# Patient Record
Sex: Male | Born: 1935 | Race: White | Hispanic: No | Marital: Married | Smoking: Never smoker
Health system: Southern US, Community
[De-identification: ages and names within clinical notes are randomized; demographics above are authoritative.]

## PROBLEM LIST (undated history)

## (undated) DIAGNOSIS — J309 Allergic rhinitis, unspecified: Secondary | ICD-10-CM

## (undated) DIAGNOSIS — Z9889 Other specified postprocedural states: Secondary | ICD-10-CM

## (undated) DIAGNOSIS — N400 Enlarged prostate without lower urinary tract symptoms: Secondary | ICD-10-CM

## (undated) DIAGNOSIS — R110 Nausea: Secondary | ICD-10-CM

## (undated) DIAGNOSIS — G629 Polyneuropathy, unspecified: Secondary | ICD-10-CM

## (undated) DIAGNOSIS — N200 Calculus of kidney: Secondary | ICD-10-CM

## (undated) DIAGNOSIS — E785 Hyperlipidemia, unspecified: Secondary | ICD-10-CM

## (undated) DIAGNOSIS — M1990 Unspecified osteoarthritis, unspecified site: Secondary | ICD-10-CM

## (undated) DIAGNOSIS — R112 Nausea with vomiting, unspecified: Secondary | ICD-10-CM

## (undated) DIAGNOSIS — K625 Hemorrhage of anus and rectum: Secondary | ICD-10-CM

## (undated) DIAGNOSIS — H9319 Tinnitus, unspecified ear: Secondary | ICD-10-CM

## (undated) DIAGNOSIS — Z87442 Personal history of urinary calculi: Secondary | ICD-10-CM

## (undated) DIAGNOSIS — R519 Headache, unspecified: Secondary | ICD-10-CM

## (undated) DIAGNOSIS — J069 Acute upper respiratory infection, unspecified: Secondary | ICD-10-CM

## (undated) DIAGNOSIS — R002 Palpitations: Secondary | ICD-10-CM

## (undated) DIAGNOSIS — I10 Essential (primary) hypertension: Secondary | ICD-10-CM

## (undated) DIAGNOSIS — G5792 Unspecified mononeuropathy of left lower limb: Secondary | ICD-10-CM

## (undated) DIAGNOSIS — Z96619 Presence of unspecified artificial shoulder joint: Secondary | ICD-10-CM

## (undated) DIAGNOSIS — J189 Pneumonia, unspecified organism: Secondary | ICD-10-CM

## (undated) DIAGNOSIS — R079 Chest pain, unspecified: Secondary | ICD-10-CM

## (undated) DIAGNOSIS — L0390 Cellulitis, unspecified: Secondary | ICD-10-CM

## (undated) DIAGNOSIS — Z96659 Presence of unspecified artificial knee joint: Secondary | ICD-10-CM

## (undated) DIAGNOSIS — R51 Headache: Secondary | ICD-10-CM

## (undated) DIAGNOSIS — Z8669 Personal history of other diseases of the nervous system and sense organs: Secondary | ICD-10-CM

## (undated) HISTORY — DX: Nausea: R11.0

## (undated) HISTORY — DX: Polyneuropathy, unspecified: G62.9

## (undated) HISTORY — DX: Essential (primary) hypertension: I10

## (undated) HISTORY — PX: COLONOSCOPY: SHX174

## (undated) HISTORY — DX: Acute upper respiratory infection, unspecified: J06.9

## (undated) HISTORY — DX: Hemorrhage of anus and rectum: K62.5

## (undated) HISTORY — PX: HERNIA REPAIR: SHX51

## (undated) HISTORY — DX: Hyperlipidemia, unspecified: E78.5

## (undated) HISTORY — DX: Personal history of urinary calculi: Z87.442

## (undated) HISTORY — DX: Tinnitus, unspecified ear: H93.19

## (undated) HISTORY — DX: Benign prostatic hyperplasia without lower urinary tract symptoms: N40.0

## (undated) HISTORY — DX: Chest pain, unspecified: R07.9

## (undated) HISTORY — PX: APPENDECTOMY: SHX54

## (undated) HISTORY — DX: Calculus of kidney: N20.0

## (undated) HISTORY — DX: Allergic rhinitis, unspecified: J30.9

## (undated) HISTORY — DX: Unspecified osteoarthritis, unspecified site: M19.90

## (undated) HISTORY — DX: Palpitations: R00.2

---
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
## 1990-09-16 HISTORY — PX: LITHOTRIPSY: SUR834

## 1998-03-14 ENCOUNTER — Ambulatory Visit (HOSPITAL_COMMUNITY): Admission: EM | Admit: 1998-03-14 | Discharge: 1998-03-14 | Payer: Self-pay

## 2001-03-29 ENCOUNTER — Emergency Department (HOSPITAL_COMMUNITY): Admission: EM | Admit: 2001-03-29 | Discharge: 2001-03-29 | Payer: Self-pay | Admitting: Unknown Physician Specialty

## 2004-07-18 ENCOUNTER — Ambulatory Visit: Payer: Self-pay | Admitting: Internal Medicine

## 2004-09-20 ENCOUNTER — Ambulatory Visit: Payer: Self-pay | Admitting: Internal Medicine

## 2004-09-28 ENCOUNTER — Ambulatory Visit: Payer: Self-pay | Admitting: Internal Medicine

## 2005-03-28 ENCOUNTER — Ambulatory Visit: Payer: Self-pay | Admitting: Internal Medicine

## 2005-04-04 ENCOUNTER — Ambulatory Visit: Payer: Self-pay | Admitting: Internal Medicine

## 2006-03-27 ENCOUNTER — Ambulatory Visit: Payer: Self-pay | Admitting: Internal Medicine

## 2006-04-03 ENCOUNTER — Ambulatory Visit: Payer: Self-pay | Admitting: Internal Medicine

## 2007-04-17 ENCOUNTER — Ambulatory Visit: Payer: Self-pay | Admitting: Internal Medicine

## 2007-04-17 LAB — CONVERTED CEMR LAB
AST: 18 units/L (ref 0–37)
BUN: 28 mg/dL — ABNORMAL HIGH (ref 6–23)
Basophils Relative: 1.3 % — ABNORMAL HIGH (ref 0.0–1.0)
Bilirubin Urine: NEGATIVE
Bilirubin, Direct: 0.2 mg/dL (ref 0.0–0.3)
Chloride: 106 meq/L (ref 96–112)
Cholesterol: 122 mg/dL (ref 0–200)
Creatinine, Ser: 0.8 mg/dL (ref 0.4–1.5)
GFR calc non Af Amer: 102 mL/min
Glucose, Bld: 97 mg/dL (ref 70–99)
Glucose, Urine, Semiquant: NEGATIVE
HDL: 44.4 mg/dL (ref >39.0)
Ketones, urine, test strip: NEGATIVE
LDL Cholesterol: 64 mg/dL (ref 0–99)
Lymphocytes Relative: 24.9 % (ref 12.0–46.0)
Neutro Abs: 3.8 10*3/uL (ref 1.4–7.7)
Neutrophils Relative %: 59.4 % (ref 43.0–77.0)
Nitrite: NEGATIVE
PSA: 2.11 ng/mL (ref 0.10–4.00)
Platelets: 165 10*3/uL (ref 150–400)
Potassium: 4.2 meq/L (ref 3.5–5.1)
Specific Gravity, Urine: 1.025
TSH: 2.7 microintl units/mL (ref 0.35–5.50)
Total Bilirubin: 1.8 mg/dL — ABNORMAL HIGH (ref 0.3–1.2)
Total Protein: 6.9 g/dL (ref 6.0–8.3)
Triglycerides: 69 mg/dL (ref 0–149)
Urobilinogen, UA: 0.2
VLDL: 14 mg/dL (ref 0–40)
WBC Urine, dipstick: NEGATIVE
WBC: 6.4 10*3/uL (ref 4.5–10.5)

## 2007-04-23 ENCOUNTER — Encounter: Payer: Self-pay | Admitting: Internal Medicine

## 2007-04-23 DIAGNOSIS — M1990 Unspecified osteoarthritis, unspecified site: Secondary | ICD-10-CM | POA: Insufficient documentation

## 2007-04-23 DIAGNOSIS — N138 Other obstructive and reflux uropathy: Secondary | ICD-10-CM | POA: Insufficient documentation

## 2007-04-23 DIAGNOSIS — E785 Hyperlipidemia, unspecified: Secondary | ICD-10-CM

## 2007-04-23 DIAGNOSIS — N400 Enlarged prostate without lower urinary tract symptoms: Secondary | ICD-10-CM | POA: Insufficient documentation

## 2007-04-23 DIAGNOSIS — Z87442 Personal history of urinary calculi: Secondary | ICD-10-CM

## 2007-04-23 DIAGNOSIS — J309 Allergic rhinitis, unspecified: Secondary | ICD-10-CM | POA: Insufficient documentation

## 2007-04-23 HISTORY — DX: Unspecified osteoarthritis, unspecified site: M19.90

## 2007-04-23 HISTORY — DX: Hyperlipidemia, unspecified: E78.5

## 2007-04-23 HISTORY — DX: Benign prostatic hyperplasia without lower urinary tract symptoms: N40.0

## 2007-04-23 HISTORY — DX: Allergic rhinitis, unspecified: J30.9

## 2007-04-23 HISTORY — DX: Other obstructive and reflux uropathy: N13.8

## 2007-04-23 HISTORY — DX: Personal history of urinary calculi: Z87.442

## 2007-05-01 ENCOUNTER — Ambulatory Visit: Payer: Self-pay | Admitting: Internal Medicine

## 2007-11-13 ENCOUNTER — Emergency Department (HOSPITAL_COMMUNITY): Admission: EM | Admit: 2007-11-13 | Discharge: 2007-11-13 | Payer: Self-pay | Admitting: Emergency Medicine

## 2008-02-04 ENCOUNTER — Ambulatory Visit: Payer: Self-pay | Admitting: Internal Medicine

## 2008-02-04 DIAGNOSIS — K625 Hemorrhage of anus and rectum: Secondary | ICD-10-CM

## 2008-02-04 DIAGNOSIS — R079 Chest pain, unspecified: Secondary | ICD-10-CM | POA: Insufficient documentation

## 2008-02-04 DIAGNOSIS — R002 Palpitations: Secondary | ICD-10-CM

## 2008-02-04 HISTORY — DX: Hemorrhage of anus and rectum: K62.5

## 2008-02-04 HISTORY — DX: Palpitations: R00.2

## 2008-02-04 HISTORY — DX: Chest pain, unspecified: R07.9

## 2008-02-04 LAB — CONVERTED CEMR LAB
ALT: 20 units/L (ref 0–53)
Alkaline Phosphatase: 69 units/L (ref 39–117)
BUN: 28 mg/dL — ABNORMAL HIGH (ref 6–23)
Basophils Absolute: 0.1 10*3/uL (ref 0.0–0.1)
Calcium: 9.4 mg/dL (ref 8.4–10.5)
Cholesterol: 165 mg/dL (ref 0–200)
Eosinophils Absolute: 0.2 10*3/uL (ref 0.0–0.7)
Eosinophils Relative: 1.4 % (ref 0.0–5.0)
Glucose, Bld: 99 mg/dL (ref 70–99)
HCT: 43.2 % (ref 39.0–52.0)
Hemoglobin: 15.1 g/dL (ref 13.0–17.0)
MCHC: 34.9 g/dL (ref 30.0–36.0)
MCV: 90.2 fL (ref 78.0–100.0)
PSA: 1.55 ng/mL (ref 0.10–4.00)
Platelets: 163 10*3/uL (ref 150–400)
RBC: 4.79 M/uL (ref 4.22–5.81)
RDW: 12.1 % (ref 11.5–14.6)
TSH: 3 microintl units/mL (ref 0.35–5.50)
Total Bilirubin: 1.5 mg/dL — ABNORMAL HIGH (ref 0.3–1.2)
WBC: 12.2 10*3/uL — ABNORMAL HIGH (ref 4.5–10.5)

## 2008-02-18 ENCOUNTER — Encounter: Payer: Self-pay | Admitting: Internal Medicine

## 2008-02-18 ENCOUNTER — Ambulatory Visit: Payer: Self-pay

## 2008-03-04 ENCOUNTER — Ambulatory Visit: Payer: Self-pay | Admitting: Internal Medicine

## 2008-03-07 ENCOUNTER — Telehealth: Payer: Self-pay | Admitting: Internal Medicine

## 2008-03-14 ENCOUNTER — Telehealth: Payer: Self-pay | Admitting: Internal Medicine

## 2008-06-02 ENCOUNTER — Ambulatory Visit: Payer: Self-pay | Admitting: Internal Medicine

## 2008-06-02 DIAGNOSIS — I10 Essential (primary) hypertension: Secondary | ICD-10-CM

## 2008-06-02 HISTORY — DX: Essential (primary) hypertension: I10

## 2008-09-01 ENCOUNTER — Ambulatory Visit: Payer: Self-pay | Admitting: Internal Medicine

## 2008-09-01 DIAGNOSIS — J069 Acute upper respiratory infection, unspecified: Secondary | ICD-10-CM | POA: Insufficient documentation

## 2008-12-29 ENCOUNTER — Ambulatory Visit: Payer: Self-pay | Admitting: Internal Medicine

## 2008-12-29 DIAGNOSIS — R110 Nausea: Secondary | ICD-10-CM | POA: Insufficient documentation

## 2009-03-24 ENCOUNTER — Telehealth: Payer: Self-pay | Admitting: Internal Medicine

## 2009-03-30 ENCOUNTER — Encounter: Payer: Self-pay | Admitting: Internal Medicine

## 2009-05-16 ENCOUNTER — Ambulatory Visit: Payer: Self-pay | Admitting: Internal Medicine

## 2009-05-16 LAB — CONVERTED CEMR LAB
ALT: 26 units/L (ref 0–53)
AST: 27 units/L (ref 0–37)
Albumin: 4.2 g/dL (ref 3.5–5.2)
Basophils Relative: 0 % (ref 0.0–3.0)
CO2: 29 meq/L (ref 19–32)
Calcium: 9.3 mg/dL (ref 8.4–10.5)
Chloride: 109 meq/L (ref 96–112)
Cholesterol: 145 mg/dL (ref 0–200)
Creatinine, Ser: 0.9 mg/dL (ref 0.4–1.5)
Eosinophils Absolute: 0.2 10*3/uL (ref 0.0–0.7)
GFR calc non Af Amer: 88.49 mL/min (ref >60)
Glucose, Bld: 100 mg/dL — ABNORMAL HIGH (ref 70–99)
HCT: 40.1 % (ref 39.0–52.0)
Hemoglobin: 14 g/dL (ref 13.0–17.0)
LDL Cholesterol: 87 mg/dL (ref 0–99)
Lymphs Abs: 1.2 10*3/uL (ref 0.7–4.0)
MCV: 90.7 fL (ref 78.0–100.0)
Platelets: 193 10*3/uL (ref 150.0–400.0)
Sodium: 141 meq/L (ref 135–145)
TSH: 1.38 microintl units/mL (ref 0.35–5.50)
Total Protein: 6.4 g/dL (ref 6.0–8.3)
Triglycerides: 62 mg/dL (ref 0.0–149.0)
VLDL: 12.4 mg/dL (ref 0.0–40.0)

## 2009-05-26 ENCOUNTER — Telehealth: Payer: Self-pay | Admitting: Internal Medicine

## 2009-09-28 ENCOUNTER — Ambulatory Visit (HOSPITAL_COMMUNITY): Admission: RE | Admit: 2009-09-28 | Discharge: 2009-09-29 | Payer: Self-pay | Admitting: Orthopedic Surgery

## 2009-09-28 HISTORY — PX: OTHER SURGICAL HISTORY: SHX169

## 2009-10-16 ENCOUNTER — Encounter: Admission: RE | Admit: 2009-10-16 | Discharge: 2009-12-11 | Payer: Self-pay | Admitting: Orthopedic Surgery

## 2010-01-16 ENCOUNTER — Telehealth: Payer: Self-pay

## 2010-02-28 ENCOUNTER — Ambulatory Visit: Payer: Self-pay | Admitting: Internal Medicine

## 2010-02-28 DIAGNOSIS — H9319 Tinnitus, unspecified ear: Secondary | ICD-10-CM | POA: Insufficient documentation

## 2010-02-28 HISTORY — DX: Tinnitus, unspecified ear: H93.19

## 2010-03-20 ENCOUNTER — Telehealth: Payer: Self-pay | Admitting: Internal Medicine

## 2010-03-26 ENCOUNTER — Encounter: Payer: Self-pay | Admitting: Internal Medicine

## 2010-05-15 ENCOUNTER — Encounter: Admission: RE | Admit: 2010-05-15 | Discharge: 2010-05-15 | Payer: Self-pay | Admitting: Otolaryngology

## 2010-05-31 ENCOUNTER — Ambulatory Visit: Payer: Self-pay | Admitting: Internal Medicine

## 2010-05-31 LAB — CONVERTED CEMR LAB
ALT: 26 units/L (ref 0–53)
Alkaline Phosphatase: 70 units/L (ref 39–117)
Basophils Relative: 0.6 % (ref 0.0–3.0)
Bilirubin, Direct: 0.2 mg/dL (ref 0.0–0.3)
Calcium: 9.2 mg/dL (ref 8.4–10.5)
Chloride: 104 meq/L (ref 96–112)
GFR calc non Af Amer: 104.08 mL/min (ref >60)
LDL Cholesterol: 83 mg/dL (ref 0–99)
MCHC: 34.8 g/dL (ref 30.0–36.0)
MCV: 92.4 fL (ref 78.0–100.0)
Monocytes Absolute: 0.6 10*3/uL (ref 0.1–1.0)
Monocytes Relative: 7.4 % (ref 3.0–12.0)
Neutrophils Relative %: 73.1 % (ref 43.0–77.0)
RDW: 12.9 % (ref 11.5–14.6)
Sodium: 141 meq/L (ref 135–145)
Total CHOL/HDL Ratio: 3
Total Protein: 6.6 g/dL (ref 6.0–8.3)
Triglycerides: 134 mg/dL (ref 0.0–149.0)
VLDL: 26.8 mg/dL (ref 0.0–40.0)
WBC: 8.8 10*3/uL (ref 4.5–10.5)

## 2010-06-08 ENCOUNTER — Telehealth: Payer: Self-pay | Admitting: Internal Medicine

## 2010-10-16 NOTE — Progress Notes (Signed)
Summary: Pt says [facility] sending req for all meds. 90 day x 3.   Phone Note Call from Patient Call back at Home Phone [phone]   Caller: spouse- Paulette Summary of Call: Pts wife called and said that [facility] is sending a fax to request all meds. Need 90 day supply on each x 3.    Pt just wanted to make Dr Amador Cunas aware.  Initial call taken by: Lucy Antigua,  Jan 16, 2010 12:47 PM  Follow-up for Phone Call        noted - will look for faxes Follow-up by: Duard Brady LPN,  Jan 17, 980 12:54 PM

## 2010-10-16 NOTE — Assessment & Plan Note (Signed)
Summary: ringing in lft ear/popping/diff hearing/cjr   Vital Signs:  Patient profile:   75 year old male Weight:      217 pounds Temp:     97.7 degrees F oral BP sitting:   116 / 78  (right arm) Cuff size:   regular  Vitals Entered By: Duard Brady LPN (February 28, 2010 9:14 AM) CC: c/o (L) ear ringing , sm amt sinus stuffiness Is Patient Diabetic? No   CC:  c/o (L) ear ringing  and sm amt sinus stuffiness.  History of Present Illness: 75 year old patient presents with a two week history of some ringing from the left.  He he is has some sinus congestion and stuffiness.  He is seen today also for follow-up of his hypertension.  He does have a history of allergic rhinitis. he states that his hearing has declined somewhat over the years, but no history of acute hearing loss.  Preventive Screening-Counseling & Management  Alcohol-Tobacco     Smoking Status: never  Allergies (verified): No Known Drug Allergies  Past History:  Past Medical History: Reviewed history from 06/02/2008 and no changes required. Hyperlipidemia Nephrolithiasis, hx of Osteoarthritis Benign prostatic hypertrophy Allergic rhinitis systolic hypertension external hemorrhoids Hypertension  Review of Systems  The patient denies anorexia, fever, weight loss, weight gain, vision loss, decreased hearing, hoarseness, chest pain, syncope, dyspnea on exertion, peripheral edema, prolonged cough, headaches, hemoptysis, abdominal pain, melena, hematochezia, severe indigestion/heartburn, hematuria, incontinence, genital sores, muscle weakness, suspicious skin lesions, transient blindness, difficulty walking, depression, unusual weight change, abnormal bleeding, enlarged lymph nodes, angioedema, breast masses, and testicular masses.    Physical Exam  General:  Well-developed,well-nourished,in no acute distress; alert,appropriate and cooperative throughout examination Head:  Normocephalic and atraumatic without  obvious abnormalities. No apparent alopecia or balding. Eyes:  No corneal or conjunctival inflammation noted. EOMI. Perrla. Funduscopic exam benign, without hemorrhages, exudates or papilledema. Vision grossly normal. Ears:  External ear exam shows no significant lesions or deformities.  Otoscopic examination reveals clear canals, tympanic membranes are intact bilaterally without bulging, retraction, inflammation or discharge. Hearing is grossly normal bilaterally. Mouth:  Oral mucosa and oropharynx without lesions or exudates.  Teeth in good repair. Neck:  No deformities, masses, or tenderness noted. Lungs:  Normal respiratory effort, chest expands symmetrically. Lungs are clear to auscultation, no crackles or wheezes. Heart:  Normal rate and regular rhythm. S1 and S2 normal without gallop, murmur, click, rub or other extra sounds.   Impression & Recommendations:  Problem # 1:  TINNITUS (ICD-388.30)  Problem # 2:  HYPERTENSION (ICD-401.9)  His updated medication list for this problem includes:    Benazepril-hydrochlorothiazide 20-12.5 Mg Tabs (Benazepril-hydrochlorothiazide) ..... One  tablet daily  Complete Medication List: 1)  Zocor 40 Mg Tabs (Simvastatin) .Marland Kitchen.. 1 once daily 2)  Proscar 5 Mg Tabs (Finasteride) .Marland Kitchen.. 1 once daily 3)  Flomax 0.4 Mg Cp24 (Tamsulosin hcl) .... 2  once daily 4)  Claritin 10 Mg Tabs (Loratadine) .Marland Kitchen.. 1 once daily 5)  Benazepril-hydrochlorothiazide 20-12.5 Mg Tabs (Benazepril-hydrochlorothiazide) .... One  tablet daily 6)  Adult Aspirin Ec Low Strength 81 Mg Tbec (Aspirin) .Marland Kitchen.. 1 once daily 7)  Urocit-k 10 1080 Mg Cr-tabs (Potassium citrate) .... 2 two times a day 8)  Naproxen Dr 500 Mg Tbec (Naproxen) .... One twice daily 9)  Indomethacin 50 Mg Caps (Indomethacin) .... 2 two times a day 10)  Fluticasone Propionate 50 Mcg/act Susp (Fluticasone propionate) .... Use daily  Patient Instructions: 1)  Please schedule a follow-up  appointment in 6 months. 2)   Limit your Sodium (Salt) to less than 2 grams a day(slightly less than 1/2 a teaspoon) to prevent fluid retention, swelling, or worsening of symptoms. 3)  It is important that you exercise regularly at least 20 minutes 5 times a week. If you develop chest pain, have severe difficulty breathing, or feel very tired , stop exercising immediately and seek medical attention. 4)  You need to lose weight. Consider a lower calorie diet and regular exercise.  Prescriptions: FLUTICASONE PROPIONATE 50 MCG/ACT SUSP (FLUTICASONE PROPIONATE) use daily  #1 x 6   Entered and Authorized by:   [name]  MD   Signed by:   [name]  MD on 02/28/2010   Method used:   Print then Mail to Patient   RxID:   [id number]

## 2010-10-16 NOTE — Consult Note (Signed)
Summary: ENT-Dr. Narda Bonds  ENT-Dr. Narda Bonds   Imported By: Maryln Gottron 04/09/2010 10:48:12  _____________________________________________________________________  External Attachment:    Type:   Image     Comment:   External Document

## 2010-10-16 NOTE — Progress Notes (Signed)
Summary: referral to ENT  Phone Note Call from Patient   Caller: Patient Call For: [name]  MD Summary of Call: Pt is still having ringing in ears, and needs a referral to ENT per Dr. Charm Rings last office visit. [phone] Initial call taken by: Lynann Beaver CMA,  March 20, 2010 9:05 AM  Follow-up for Phone Call        OK to refer- Use whoever Dr Kirtland Bouchard uses. Follow-up by: Evelena Peat MD,  March 20, 2010 9:14 AM

## 2010-10-16 NOTE — Assessment & Plan Note (Signed)
Summary: emp--will fast//ccm   Vital Signs:  Patient profile:   75 year old male Height:      69 inches Weight:      210 pounds BMI:     31.12 Temp:     97.6 degrees F oral BP sitting:   120 / 80  (right arm) Cuff size:   regular  Vitals Entered By: Duard Brady LPN (May 31, 2010 9:25 AM) CC: cpx - doing well Is Patient Diabetic? No  Flu Vaccine Consent Questions     Do you have a history of severe allergic reactions to this vaccine? no    Any prior history of allergic reactions to egg and/or gelatin? no    Do you have a sensitivity to the preservative Thimersol? no    Do you have a past history of Guillan-Barre Syndrome? no    Do you currently have an acute febrile illness? no    Have you ever had a severe reaction to latex? no    Vaccine information given and explained to patient? yes    Are you currently pregnant? no    Lot Number:[id number]   Exp Date:03/16/2011   Site Given  Left Deltoid IM  CC:  cpx - doing well.  History of Present Illness: 75 year old patient who is seen today for a wellness examination.  He is followed by urology for BPH and has a history of nephrolithiasis.  He has dyslipidemia osteoarthritis and hypertension.  He is doing quite well and denies any cardiopulmonary complaints.  Here for Medicare AWV:  1.   Risk factors based on Past M, S, F history:  risk factors include hypertension, dyslipidemia, and a family history of cardiac disease 2.   Physical Activities: remains quite active.  He has 5 forces and rides daily, also walks frequently 3.   Depression/mood: no history of depression or mood disorder 4.   Hearing: mild sensorineural hearing loss, left greater than the right 5.   ADL's: independent in all aspects of daily living 6.   Fall Risk: low 7.   Home Safety: no problems identified 8.   Height, weight, &visual acuity:height and weight normal and unchanged.  No difficulty with visual acuity 9.   Counseling: heart healthy diet  and regular.  Exercise encouraged as well as modest weight loss 10.   Labs ordered based on risk factors: average her profile, including lipid profile will be reviewed 11.           Referral Coordination- follow-up urology 12.           Care Plan- heart healthy diet and weight loss, exercise regimen discussed and encouraged 13.            Cognitive Assessment- alert and oriented normal affect.  No cognitive dysfunction; handles  all executive functions without difficulty   Allergies (verified): No Known Drug Allergies  Past History:  Past Medical History: Reviewed history from 06/02/2008 and no changes required. Hyperlipidemia Nephrolithiasis, hx of Osteoarthritis Benign prostatic hypertrophy Allergic rhinitis systolic hypertension external hemorrhoids Hypertension  Past Surgical History: Reviewed history from 05/16/2009 and no changes required. Appendectomy Inguinal herniorrhaphy Lithotripsy colonoscopy 2004 ETT 1-06 Cardiolye 6-09  Family History: Reviewed history from 05/16/2009 and no changes required. father died at  12, cerebrovascular disease mother died at age 25, MI two sisters two brothers, positive for diabetes; one sister with COPD who is deceased;  one sister died of suicide death  Social History: Reviewed history from 05/01/2007 and no changes required. Married  walks about 2 miles daily  Review of Systems  The patient denies anorexia, fever, weight loss, weight gain, vision loss, decreased hearing, hoarseness, chest pain, syncope, dyspnea on exertion, peripheral edema, prolonged cough, headaches, hemoptysis, abdominal pain, melena, hematochezia, severe indigestion/heartburn, hematuria, incontinence, genital sores, muscle weakness, suspicious skin lesions, transient blindness, difficulty walking, depression, unusual weight change, abnormal bleeding, enlarged lymph nodes, angioedema, breast masses, and testicular masses.    Physical Exam  General:   overweight-appearing.  120/78overweight-appearing.   Head:  Normocephalic and atraumatic without obvious abnormalities. No apparent alopecia or balding. Eyes:  No corneal or conjunctival inflammation noted. EOMI. Perrla. Funduscopic exam benign, without hemorrhages, exudates or papilledema. Vision grossly normal. Ears:  External ear exam shows no significant lesions or deformities.  Otoscopic examination reveals clear canals, tympanic membranes are intact bilaterally without bulging, retraction, inflammation or discharge. Hearing is grossly normal bilaterally. Nose:  External nasal examination shows no deformity or inflammation. Nasal mucosa are pink and moist without lesions or exudates. Mouth:  Oral mucosa and oropharynx without lesions or exudates.  Teeth in good repair. Neck:  No deformities, masses, or tenderness noted. Chest Wall:  No deformities, masses, tenderness or gynecomastia noted. Breasts:  No masses or gynecomastia noted Lungs:  Normal respiratory effort, chest expands symmetrically. Lungs are clear to auscultation, no crackles or wheezes. Heart:  Normal rate and regular rhythm. S1 and S2 normal without gallop, murmur, click, rub or other extra sounds.  grade 2/6 SEM Abdomen:  Bowel sounds positive,abdomen soft and non-tender without masses, organomegaly or hernias noted. Genitalia:  Testes bilaterally descended without nodularity, tenderness or masses. No scrotal masses or lesions. No penis lesions or urethral discharge. Prostate:  per urology Msk:  No deformity or scoliosis noted of thoracic or lumbar spine.   Pulses:  R and L carotid,radial,femoral,dorsalis pedis and posterior tibial pulses are full and equal bilaterally Extremities:  No clubbing, cyanosis, edema, or deformity noted with normal full range of motion of all joints.   Neurologic:  No cranial nerve deficits noted. Station and gait are normal. Plantar reflexes are down-going bilaterally. DTRs are symmetrical  throughout. Sensory, motor and coordinative functions appear intact. Skin:  Intact without suspicious lesions or rashes Cervical Nodes:  No lymphadenopathy noted Axillary Nodes:  No palpable lymphadenopathy Inguinal Nodes:  No significant adenopathy Psych:  Cognition and judgment appear intact. Alert and cooperative with normal attention span and concentration. No apparent delusions, illusions, hallucinations   Impression & Recommendations:  Problem # 1:  Preventive Health Care (ICD-V70.0)  Orders: EKG w/ Interpretation (93000) Medicare -1st Annual Wellness Visit [phone]) Venipuncture (60454) TLB-Lipid Panel (80061-LIPID) TLB-BMP (Basic Metabolic Panel-BMET) (80048-METABOL) TLB-CBC Platelet - w/Differential (85025-CBCD) TLB-TSH (Thyroid Stimulating Hormone) (84443-TSH) TLB-Hepatic/Liver Function Pnl (80076-HEPATIC)  Complete Medication List: 1)  Proscar 5 Mg Tabs (Finasteride) .Marland Kitchen.. 1 once daily 2)  Flomax 0.4 Mg Cp24 (Tamsulosin hcl) .... 2  once daily 3)  Claritin 10 Mg Tabs (Loratadine) .Marland Kitchen.. 1 once daily 4)  Benazepril-hydrochlorothiazide 20-12.5 Mg Tabs (Benazepril-hydrochlorothiazide) .... One  tablet daily 5)  Adult Aspirin Ec Low Strength 81 Mg Tbec (Aspirin) .Marland Kitchen.. 1 once daily 6)  Urocit-k 10 1080 Mg Cr-tabs (Potassium citrate) .... 2 two times a day 7)  Fluticasone Propionate 50 Mcg/act Susp (Fluticasone propionate) .... Use daily 8)  Celebrex 200 Mg Caps (Celecoxib) .... Qd 9)  Multivitamins Tabs (Multiple vitamin) .... Qd 10)  Coenzyme Q10 10 Mg Caps (Coenzyme q10) .... Qd 11)  Glucosamine 500 Mg Caps (Glucosamine sulfate) .... Qd 12)  Chondroitin Sulfate 150 Mg Caps (Chondroitin sulfate a) .... Qd 13)  Msm 1000 Mg Caps (Methylsulfonylmethane) .... Qd 14)  Krill Oil 1000 Mg Caps (Krill oil) .... 2 qd 15)  Vitamin B Complex Caps (B complex vitamins) .... Qd 16)  Simvastatin 40 Mg Tabs (Simvastatin) .... One daily  Other Orders: Admin 1st Vaccine (16109) Flu Vaccine  81yrs + (60454)  Patient Instructions: 1)  Please schedule a follow-up appointment in 6 months. 2)  Limit your Sodium (Salt) to less than 2 grams a day(slightly less than 1/2 a teaspoon) to prevent fluid retention, swelling, or worsening of symptoms. 3)  It is important that you exercise regularly at least 20 minutes 5 times a week. If you develop chest pain, have severe difficulty breathing, or feel very tired , stop exercising immediately and seek medical attention. 4)  You need to lose weight. Consider a lower calorie diet and regular exercise.  Prescriptions: SIMVASTATIN 40 MG TABS (SIMVASTATIN) one daily  #90 x 6   Entered and Authorized by:   [name]  MD   Signed by:   [name]  MD on 05/31/2010   Method used:   Faxed to ...       [facility] (mail-order)             , [place]         Ph: [phone]       Fax: [phone]   RxID:   [id number] FLUTICASONE PROPIONATE 50 MCG/ACT SUSP (FLUTICASONE PROPIONATE) use daily  #1 x 6   Entered and Authorized by:   [name]  MD   Signed by:   [name]  MD on 05/31/2010   Method used:   Faxed to ...       [facility] (mail-order)             , [place]         Ph: [phone]       Fax: [phone]   RxID:   [id number] UROCIT-K 10 1080 MG CR-TABS (POTASSIUM CITRATE) 2 two times a day  #360 x 6   Entered and Authorized by:   [name]  MD   Signed by:   [name]  MD on 05/31/2010   Method used:   Faxed to ...       [facility] (mail-order)             , [place]         Ph: [phone]       Fax: [phone]   RxID:   [id number] BENAZEPRIL-HYDROCHLOROTHIAZIDE 20-12.5 MG TABS (BENAZEPRIL-HYDROCHLOROTHIAZIDE) one  tablet daily  #90 x 6   Entered and Authorized by:   [name]  MD   Signed by:   [name]  MD on 05/31/2010   Method used:   Faxed to ...       [facility] (mail-order)             , [place]         Ph: [phone]       Fax: [phone]   RxID:    [id number] FLOMAX 0.4 MG  CP24 (TAMSULOSIN HCL) 2  once daily  #180 x 6   Entered and Authorized by:   [name]  MD   Signed by:   [name]  MD on 05/31/2010   Method used:   Faxed to ...       [facility] (  mail-order)             , [place]         Ph: [phone]       Fax: [phone]   RxID:   [id number] PROSCAR 5 MG  TABS (FINASTERIDE) 1 once daily  #90 Tablet x 6   Entered and Authorized by:   [name]  MD   Signed by:   [name]  MD on 05/31/2010   Method used:   Faxed to ...       [facility] (mail-order)             , [place]         Ph: [phone]       Fax: [phone]   RxID:   [id number]

## 2010-10-16 NOTE — Progress Notes (Signed)
Summary: lab results.  Phone Note Call from Patient Call back at Work Phone [phone]   Caller: Patient Call For: [name]  MD Summary of Call: Please call pt regarding labs.  He would like a copy of these. Initial call taken by: Lynann Beaver CMA,  June 08, 2010 1:48 PM    please mail copy per Dr Kirtland Bouchard done [facility] CMA  June 08, 2010 3:13 PM

## 2010-12-02 LAB — CBC
HCT: 44.8 % (ref 39.0–52.0)
Hemoglobin: 15.6 g/dL (ref 13.0–17.0)
WBC: 10.1 10*3/uL (ref 4.0–10.5)

## 2010-12-02 LAB — COMPREHENSIVE METABOLIC PANEL
ALT: 25 U/L (ref 0–53)
AST: 19 U/L (ref 0–37)
Albumin: 4.6 g/dL (ref 3.5–5.2)
BUN: 28 mg/dL — ABNORMAL HIGH (ref 6–23)
Calcium: 9.7 mg/dL (ref 8.4–10.5)
Chloride: 98 mEq/L (ref 96–112)
Creatinine, Ser: 0.84 mg/dL (ref 0.4–1.5)
GFR calc non Af Amer: >60 mL/min (ref >60)
Glucose, Bld: 88 mg/dL (ref 70–99)
Sodium: 133 mEq/L — ABNORMAL LOW (ref 135–145)
Total Bilirubin: 1 mg/dL (ref 0.3–1.2)

## 2010-12-02 LAB — PROTIME-INR
INR: 0.98 (ref 0.00–1.49)
Prothrombin Time: 12.9 seconds (ref 11.6–15.2)

## 2010-12-02 LAB — URINALYSIS, ROUTINE W REFLEX MICROSCOPIC
Bilirubin Urine: NEGATIVE
Nitrite: NEGATIVE

## 2010-12-20 ENCOUNTER — Other Ambulatory Visit: Payer: Self-pay | Admitting: Internal Medicine

## 2011-02-01 NOTE — Assessment & Plan Note (Signed)
[facility] OFFICE NOTE   NAME:[name], [name] [name]                    MRN:          [id number]  DATE:04/03/2006                            DOB:          [birth year]   Patient is a 75 year old gentleman seen today for a wellness exam.   MEDICAL PROBLEMS:  1.  Hypercholesterolemia.  2.  Renal stone disease.  3.  BPH.   He is followed closely by Dr. Vernie Ammons.  He is doing well today without  concerns or complaints.  Additionally, he has a history of  DJD and seasonal  allergic rhinitis.  He has required lithotripsy in the past.  His medical  regimen reviewed.   REVIEW OF SYSTEMS:  Unremarkable.  Did have colonoscopy in 2004.   FAMILY HISTORY:  Unchanged.  Positive for diabetes and coronary artery  disease.   PHYSICAL EXAMINATION:  GENERAL APPEARANCE:  A healthy-appearing fit male who  appeared younger than his stated age.  VITAL SIGNS:  Blood pressure was well controlled.  HEENT:  Fundi, ears, nose and throat clear.  NECK:  No bruits.  CHEST:  Clear.  CARDIOVASCULAR:  Normal heart sounds, no murmurs.  ABDOMEN:  Benign.  No organomegaly.  GU:  External genitalia normal.  RECTAL:  Exam deferred.  EXTREMITIES:  Intact peripheral pulses, posterior tibial pulses were faint.  NEUROLOGIC:  Negative.   IMPRESSION:  1.  Hypercholesterolemia.  2.  Degenerative joint disease.  3.  Seasonal allergic rhinitis.  4.  Renal stone disease.   DISPOSITION:  Medical regimen unchanged.  Prescriptions dispensed.  Will  reassess in one year.                                  [name], MD   PFK/[facility]  DD:  04/03/2006  DT:  04/03/2006  Job #:  [id number]

## 2011-06-05 ENCOUNTER — Other Ambulatory Visit (INDEPENDENT_AMBULATORY_CARE_PROVIDER_SITE_OTHER): Payer: Medicare Other

## 2011-06-05 DIAGNOSIS — Z0000 Encounter for general adult medical examination without abnormal findings: Secondary | ICD-10-CM

## 2011-06-05 DIAGNOSIS — E785 Hyperlipidemia, unspecified: Secondary | ICD-10-CM

## 2011-06-05 DIAGNOSIS — I10 Essential (primary) hypertension: Secondary | ICD-10-CM

## 2011-06-05 DIAGNOSIS — Z125 Encounter for screening for malignant neoplasm of prostate: Secondary | ICD-10-CM

## 2011-06-05 LAB — POCT URINALYSIS DIPSTICK
Bilirubin, UA: NEGATIVE
Ketones, UA: NEGATIVE
Leukocytes, UA: NEGATIVE
Nitrite, UA: NEGATIVE
Protein, UA: NEGATIVE
Spec Grav, UA: 1.01
Urobilinogen, UA: 0.2
pH, UA: 5.5

## 2011-06-05 LAB — CBC WITH DIFFERENTIAL/PLATELET
Basophils Relative: 0.6 % (ref 0.0–3.0)
Eosinophils Relative: 4.7 % (ref 0.0–5.0)
HCT: 44.9 % (ref 39.0–52.0)
Hemoglobin: 15.2 g/dL (ref 13.0–17.0)
Lymphocytes Relative: 19.2 % (ref 12.0–46.0)
Lymphs Abs: 1.5 10*3/uL (ref 0.7–4.0)
MCV: 92.6 fl (ref 78.0–100.0)
Monocytes Relative: 8.4 % (ref 3.0–12.0)
Neutro Abs: 5.4 10*3/uL (ref 1.4–7.7)
Neutrophils Relative %: 67.1 % (ref 43.0–77.0)

## 2011-06-05 LAB — BASIC METABOLIC PANEL
Calcium: 9.4 mg/dL (ref 8.4–10.5)
Chloride: 101 mEq/L (ref 96–112)
Creatinine, Ser: 0.9 mg/dL (ref 0.4–1.5)
Glucose, Bld: 94 mg/dL (ref 70–99)
Sodium: 139 mEq/L (ref 135–145)

## 2011-06-05 LAB — HEPATIC FUNCTION PANEL
ALT: 28 U/L (ref 0–53)
Alkaline Phosphatase: 85 U/L (ref 39–117)

## 2011-06-07 LAB — POCT CARDIAC MARKERS
CKMB, poc: <1 — ABNORMAL LOW
Troponin i, poc: <0.05

## 2011-06-07 LAB — CBC
MCV: 89.8
Platelets: 197
RBC: 4.55
WBC: 10.5

## 2011-06-07 LAB — I-STAT 8, (EC8 V) (CONVERTED LAB)
Acid-base deficit: 1
Bicarbonate: 24.3 — ABNORMAL HIGH
Chloride: 107
HCT: 43
Hemoglobin: 14.6
Potassium: 4.5
Sodium: 138
TCO2: 25
pCO2, Ven: 39.9 — ABNORMAL LOW
pH, Ven: 7.393 — ABNORMAL HIGH

## 2011-06-07 LAB — DIFFERENTIAL
Lymphocytes Relative: 13
Lymphs Abs: 1.3
Neutrophils Relative %: 79 — ABNORMAL HIGH

## 2011-06-11 ENCOUNTER — Encounter: Payer: Self-pay | Admitting: Internal Medicine

## 2011-06-11 ENCOUNTER — Other Ambulatory Visit: Payer: Self-pay | Admitting: Internal Medicine

## 2011-06-12 ENCOUNTER — Ambulatory Visit (INDEPENDENT_AMBULATORY_CARE_PROVIDER_SITE_OTHER): Payer: Medicare Other | Admitting: Internal Medicine

## 2011-06-12 ENCOUNTER — Encounter: Payer: Self-pay | Admitting: Internal Medicine

## 2011-06-12 VITALS — BP 120/80 | HR 68 | Temp 98.0°F | Resp 16 | Ht 70.0 in | Wt 220.0 lb

## 2011-06-12 DIAGNOSIS — I10 Essential (primary) hypertension: Secondary | ICD-10-CM

## 2011-06-12 DIAGNOSIS — Z0000 Encounter for general adult medical examination without abnormal findings: Secondary | ICD-10-CM

## 2011-06-12 DIAGNOSIS — E785 Hyperlipidemia, unspecified: Secondary | ICD-10-CM

## 2011-06-12 DIAGNOSIS — N400 Enlarged prostate without lower urinary tract symptoms: Secondary | ICD-10-CM

## 2011-06-12 DIAGNOSIS — Z23 Encounter for immunization: Secondary | ICD-10-CM

## 2011-06-12 MED ORDER — BENAZEPRIL-HYDROCHLOROTHIAZIDE 20-12.5 MG PO TABS: 1.0000 | ORAL_TABLET | Freq: Every day | ORAL | Status: DC

## 2011-06-12 MED ORDER — FLUTICASONE PROPIONATE 50 MCG/ACT NA SUSP: 1.0000 | Freq: Every day | NASAL | Status: DC

## 2011-06-12 MED ORDER — SIMVASTATIN 40 MG PO TABS: 40.0000 mg | ORAL_TABLET | Freq: Every day | ORAL | Status: DC

## 2011-06-12 MED ORDER — MELOXICAM 7.5 MG PO TABS: 7.5000 mg | ORAL_TABLET | Freq: Every day | ORAL | Status: DC

## 2011-06-12 NOTE — Patient Instructions (Signed)
Limit your sodium (Salt) intake    It is important that you exercise regularly, at least 20 minutes 3 to 4 times per week.  If you develop chest pain or shortness of breath seek  medical attention.  Return in 6 months for follow-up  Please check your blood pressure on a regular basis.  If it is consistently greater than 150/90, please make an office appointment.   

## 2011-06-12 NOTE — Progress Notes (Signed)
Subjective:    Patient ID: [name] [name], male    DOB: 10-21-1935, 75 y.o.   MRN: [id number]  HPI History of Present Illness:   75 year old patient who is seen today for a wellness examination. He is followed by urology for BPH and has a history of nephrolithiasis. He has dyslipidemia osteoarthritis and hypertension. He is doing quite well and denies any cardiopulmonary complaints.   Here for Medicare AWV:   1. Risk factors based on Past M, S, F history: risk factors include hypertension, dyslipidemia, and a family history of cardiac disease  2. Physical Activities: remains quite active. He has 5 horses  and rides daily, also walks frequently  3. Depression/mood: no history of depression or mood disorder  4. Hearing: mild sensorineural hearing loss, left greater than the right  5. ADL's: independent in all aspects of daily living  6. Fall Risk: low  7. Home Safety: no problems identified  8. Height, weight, &visual acuity:height and weight normal and unchanged. No difficulty with visual acuity  9. Counseling: heart healthy diet and regular. Exercise encouraged as well as modest weight loss  10. Labs ordered based on risk factors: average her profile, including lipid profile will be reviewed  11. Referral Coordination- follow-up urology  12. Care Plan- heart healthy diet and weight loss, exercise regimen discussed and encouraged  13. Cognitive Assessment- alert and oriented normal affect. No cognitive dysfunction; handles all executive functions without difficulty   Allergies (verified) :  No Known Drug Allergies   Past History:  Past Medical History:  Reviewed history from 06/02/2008 and no changes required.  Hyperlipidemia  Nephrolithiasis, hx of  Osteoarthritis  Benign prostatic hypertrophy  Allergic rhinitis  systolic hypertension  external hemorrhoids  Hypertension   Past Surgical History:  Reviewed history from 05/16/2009 and no changes required.  Appendectomy    Inguinal herniorrhaphy  Lithotripsy  colonoscopy 2004  ETT 1-06  Cardiolye 6-09   Family History:  Reviewed history from 05/16/2009 and no changes required.  father died at 64, cerebrovascular disease  mother died at age 87, MI  two sisters two brothers, positive for diabetes; one sister with COPD who is deceased; one brother  died of suicide death  Social History:  Reviewed history from 05/01/2007 and no changes required.  Married  walks about 2 miles daily     Review of Systems  Constitutional: Negative for fever, chills, activity change, appetite change and fatigue.  HENT: Negative for hearing loss, ear pain, congestion, rhinorrhea, sneezing, mouth sores, trouble swallowing, neck pain, neck stiffness, dental problem, voice change, sinus pressure and tinnitus.   Eyes: Negative for photophobia, pain, redness and visual disturbance.  Respiratory: Negative for apnea, cough, choking, chest tightness, shortness of breath and wheezing.   Cardiovascular: Negative for chest pain, palpitations and leg swelling.  Gastrointestinal: Negative for nausea, vomiting, abdominal pain, diarrhea, constipation, blood in stool, abdominal distention, anal bleeding and rectal pain.  Genitourinary: Negative for dysuria, urgency, frequency, hematuria, flank pain, decreased urine volume, discharge, penile swelling, scrotal swelling, difficulty urinating, genital sores and testicular pain.  Musculoskeletal: Negative for myalgias, back pain, joint swelling, arthralgias and gait problem.  Skin: Negative for color change, rash and wound.  Neurological: Negative for dizziness, tremors, seizures, syncope, facial asymmetry, speech difficulty, weakness, light-headedness, numbness and headaches.  Hematological: Negative for adenopathy. Does not bruise/bleed easily.  Psychiatric/Behavioral: Negative for suicidal ideas, hallucinations, behavioral problems, confusion, sleep disturbance, self-injury, dysphoric mood,  decreased concentration and agitation. The patient is not nervous/anxious.  Objective:   Physical Exam  Constitutional: He appears well-developed and well-nourished.  HENT:  Head: Normocephalic and atraumatic.  Right Ear: External ear normal.  Left Ear: External ear normal.  Nose: Nose normal.  Mouth/Throat: Oropharynx is clear and moist.  Eyes: Conjunctivae and EOM are normal. Pupils are equal, round, and reactive to light. No scleral icterus.  Neck: Normal range of motion. Neck supple. No JVD present. No thyromegaly present.  Cardiovascular: Regular rhythm, normal heart sounds and intact distal pulses.  Exam reveals no gallop and no friction rub.   No murmur heard. Pulmonary/Chest: Effort normal and breath sounds normal. He exhibits no tenderness.  Abdominal: Soft. Bowel sounds are normal. He exhibits no distension and no mass. There is no tenderness.  Genitourinary: Penis normal.  Musculoskeletal: Normal range of motion. He exhibits no edema and no tenderness.  Lymphadenopathy:    He has no cervical adenopathy.  Neurological: He is alert. He has normal reflexes. No cranial nerve deficit. Coordination normal.  Skin: Skin is warm and dry. No rash noted.  Psychiatric: He has a normal mood and affect. His behavior is normal.          Assessment & Plan:    Preventive health examination Hypertension well controlled Dyslipidemia well controlled. We'll continue simvastatin therapy  Recheck 6 months Urology followup as scheduled

## 2011-07-30 HISTORY — PX: OTHER SURGICAL HISTORY: SHX169

## 2011-08-18 ENCOUNTER — Other Ambulatory Visit: Payer: Self-pay | Admitting: Internal Medicine

## 2011-08-21 ENCOUNTER — Other Ambulatory Visit: Payer: Self-pay | Admitting: Internal Medicine

## 2011-08-21 NOTE — Telephone Encounter (Signed)
Pt need generic flomax #14 call into [facility] [phone] . Pt is waiting on mailorder to arrive

## 2011-08-22 MED ORDER — TAMSULOSIN HCL 0.4 MG PO CAPS: 0.4000 mg | ORAL_CAPSULE | Freq: Every day | ORAL | Status: DC

## 2011-08-22 NOTE — Telephone Encounter (Signed)
done

## 2011-08-23 ENCOUNTER — Other Ambulatory Visit: Payer: Self-pay | Admitting: Internal Medicine

## 2011-10-03 ENCOUNTER — Other Ambulatory Visit: Payer: Self-pay

## 2011-10-03 MED ORDER — CELECOXIB 200 MG PO CAPS: 200.0000 mg | ORAL_CAPSULE | Freq: Every day | ORAL | Status: DC

## 2011-10-03 NOTE — Telephone Encounter (Signed)
celebrex sent to [facility]

## 2011-10-07 ENCOUNTER — Telehealth: Payer: Self-pay | Admitting: *Deleted

## 2011-10-07 MED ORDER — FINASTERIDE 5 MG PO TABS: 5.0000 mg | ORAL_TABLET | Freq: Every day | ORAL | Status: DC

## 2011-10-07 MED ORDER — TAMSULOSIN HCL 0.4 MG PO CAPS: 0.4000 mg | ORAL_CAPSULE | Freq: Every day | ORAL | Status: DC

## 2011-10-07 NOTE — Telephone Encounter (Signed)
Spoke with wife - informed that he should take only 1 flomax instead of 2 - rx done

## 2011-10-07 NOTE — Telephone Encounter (Signed)
#  90 each  One daily  RF 4

## 2011-10-07 NOTE — Telephone Encounter (Signed)
Please call pt's wife ASAP re: his change of meds to [facility], and is out of meds.

## 2011-10-07 NOTE — Telephone Encounter (Signed)
soke with wife - on proscar and flomax - mailorder wont fill without our auth - please advise then I will call in short term on flomax - he will be out in 2 days . He has been on both meds for quite some time.

## 2012-02-07 ENCOUNTER — Other Ambulatory Visit: Payer: Self-pay

## 2012-02-07 MED ORDER — FLUTICASONE PROPIONATE 50 MCG/ACT NA SUSP: 1.0000 | Freq: Every day | NASAL | Status: DC

## 2012-02-15 DIAGNOSIS — L0390 Cellulitis, unspecified: Secondary | ICD-10-CM | POA: Insufficient documentation

## 2012-02-15 HISTORY — DX: Cellulitis, unspecified: L03.90

## 2012-02-19 ENCOUNTER — Ambulatory Visit (INDEPENDENT_AMBULATORY_CARE_PROVIDER_SITE_OTHER): Payer: Medicare Other | Admitting: Internal Medicine

## 2012-02-19 ENCOUNTER — Encounter: Payer: Self-pay | Admitting: Internal Medicine

## 2012-02-19 VITALS — BP 92/58 | Temp 98.5°F | Wt 208.0 lb

## 2012-02-19 DIAGNOSIS — M1990 Unspecified osteoarthritis, unspecified site: Secondary | ICD-10-CM

## 2012-02-19 DIAGNOSIS — I10 Essential (primary) hypertension: Secondary | ICD-10-CM

## 2012-02-19 DIAGNOSIS — L259 Unspecified contact dermatitis, unspecified cause: Secondary | ICD-10-CM

## 2012-02-19 DIAGNOSIS — L309 Dermatitis, unspecified: Secondary | ICD-10-CM

## 2012-02-19 MED ORDER — GABAPENTIN 600 MG PO TABS: 600.0000 mg | ORAL_TABLET | Freq: Three times a day (TID) | ORAL | Status: DC

## 2012-02-19 NOTE — Patient Instructions (Signed)
Call or return to clinic prn if these symptoms worsen or fail to improve as anticipated.

## 2012-02-19 NOTE — Progress Notes (Signed)
  Subjective:    Patient ID: [name] [name], male    DOB: 12-28-1935, 76 y.o.   MRN: [id number]  HPI  76 year old patient who has a history of treated hypertension as well as osteoarthritis. He presents with a two-day history of a rash involving the left flank area. He has been seen by urology recently and is on day 7 of a prescription for Bactrim DS for a UTI. Rash as described slightly tender and pruritic.    Review of Systems  Constitutional: Negative for fever, chills, appetite change and fatigue.  HENT: Negative for hearing loss, ear pain, congestion, sore throat, trouble swallowing, neck stiffness, dental problem, voice change and tinnitus.   Eyes: Negative for pain, discharge and visual disturbance.  Respiratory: Negative for cough, chest tightness, wheezing and stridor.   Cardiovascular: Negative for chest pain, palpitations and leg swelling.  Gastrointestinal: Negative for nausea, vomiting, abdominal pain, diarrhea, constipation, blood in stool and abdominal distention.  Genitourinary: Negative for urgency, hematuria, flank pain, discharge, difficulty urinating and genital sores.  Musculoskeletal: Negative for myalgias, back pain, joint swelling, arthralgias and gait problem.  Skin: Positive for rash.  Neurological: Negative for dizziness, syncope, speech difficulty, weakness, numbness and headaches.  Hematological: Negative for adenopathy. Does not bruise/bleed easily.  Psychiatric/Behavioral: Negative for behavioral problems and dysphoric mood. The patient is not nervous/anxious.        Objective:   Physical Exam  Constitutional: He appears well-developed and well-nourished. No distress.       Blood pressure 120/64  Skin: Rash noted.       An oval area of erythema associated with some mild tenderness and induration involving his left flank area widest diameter was approximately 10 cm          Assessment & Plan:    Nonspecific dermatitis left flank. This does not  have the appearance of shingles. Wonder if this is not an allergic reaction to the sulfa. We'll continue Claritin and discontinue Bactrim at this time and clinically observe. We'll apply warm compresses.

## 2012-02-20 ENCOUNTER — Telehealth: Payer: Self-pay | Admitting: Family Medicine

## 2012-02-20 NOTE — Telephone Encounter (Signed)
Pt was in to see Dr. Kirtland Bouchard yesterday for rash on torso. Was thought to be reaction to abx. Stopped taking the abx. Rash is larger, more painful. Now is aching all over, and having pain in his back. Wife wondering if this could be something other than allergic reaction. Please call ASAP. Thank you.

## 2012-02-20 NOTE — Telephone Encounter (Signed)
Spoke with wife - rash worse?, chills, body aches - dr. Amador Cunas out of office until Monday - appt made for tomorrow with dr. Caryl Never - if feels better and has ok night - will call and cancel appt in AM .

## 2012-02-21 ENCOUNTER — Encounter: Payer: Self-pay | Admitting: Family Medicine

## 2012-02-21 ENCOUNTER — Telehealth: Payer: Self-pay | Admitting: *Deleted

## 2012-02-21 ENCOUNTER — Ambulatory Visit (INDEPENDENT_AMBULATORY_CARE_PROVIDER_SITE_OTHER): Payer: Medicare Other | Admitting: Family Medicine

## 2012-02-21 VITALS — BP 130/70 | Temp 99.8°F | Wt 208.0 lb

## 2012-02-21 DIAGNOSIS — M549 Dorsalgia, unspecified: Secondary | ICD-10-CM

## 2012-02-21 DIAGNOSIS — L0291 Cutaneous abscess, unspecified: Secondary | ICD-10-CM

## 2012-02-21 DIAGNOSIS — L0390 Cellulitis, unspecified: Secondary | ICD-10-CM

## 2012-02-21 LAB — POCT URINALYSIS DIPSTICK
Ketones, UA: NEGATIVE
Protein, UA: NEGATIVE
Spec Grav, UA: 1.015

## 2012-02-21 MED ORDER — CEFTRIAXONE SODIUM 1 G IJ SOLR: 1.0000 g | Freq: Once | INTRAMUSCULAR | Status: DC

## 2012-02-21 MED ORDER — CEPHALEXIN 500 MG PO CAPS: 500.0000 mg | ORAL_CAPSULE | Freq: Three times a day (TID) | ORAL | Status: AC

## 2012-02-21 MED ORDER — CEFTRIAXONE SODIUM 1 G IJ SOLR
1.0000 g | Freq: Once | INTRAMUSCULAR | Status: DC
  Administered 2012-02-21: 1 g via INTRAMUSCULAR

## 2012-02-21 NOTE — Patient Instructions (Signed)
Followup promptly for any nausea and vomiting or worsening rash over the weekend

## 2012-02-21 NOTE — Telephone Encounter (Signed)
Spoke with wife. Patient had shaking chill this afternoon. No vomiting or nausea. No confusion. No lethargy. No recurrent chills since then. Temperature has come down with Tylenol. He has kept down antibiotic. They know to followup immediately to emergency department if he develops any symptoms as above

## 2012-02-21 NOTE — Progress Notes (Signed)
  Subjective:    Patient ID: [name] [name], male    DOB: [birth year], 76 y.o.   MRN: [id number]  HPI  Patient has a low-grade fever and chills which started last night. Has some diffuse body aches. Had recent treatment with Septra DS and had nonspecific rash couple days ago felt to represent nonspecific dermatitis and possibly allergy to Septra. He denied any generalized rash. Has not been on any antibiotics last couple days. Denies any pustules. He has area of erythema left iliac crest region which has progressed over the past couple of days. No vesicles. Rash is tender to palpation. No pruritus. He has some nonspecific low back pain. No burning with urination. No nausea or vomiting.  Past Medical History  Diagnosis Date  . HYPERLIPIDEMIA 04/23/2007  . TINNITUS 02/28/2010  . HYPERTENSION 06/02/2008  . URI 09/01/2008  . ALLERGIC RHINITIS 04/23/2007  . RECTAL BLEEDING 02/04/2008  . BENIGN PROSTATIC HYPERTROPHY 04/23/2007  . OSTEOARTHRITIS 04/23/2007  . Palpitations 02/04/2008  . CHEST PAIN 02/04/2008  . NAUSEA 12/29/2008  . NEPHROLITHIASIS, HX OF 04/23/2007   Past Surgical History  Procedure Date  . Appendectomy   . Hernia repair   . Lithotripsy     reports that he has never smoked. He has never used smokeless tobacco. He reports that he does not drink alcohol or use illicit drugs. family history includes COPD in his sister; Diabetes in his brother and sister; Heart disease (age of onset:68) in his mother; and Stroke (age of onset:82) in his father. No Known Allergies    Review of Systems  Constitutional: Positive for fever, chills and fatigue.  Respiratory: Negative for cough and shortness of breath.   Gastrointestinal: Negative for nausea and vomiting.  Genitourinary: Negative for dysuria.  Neurological: Negative for dizziness and syncope.  Hematological: Negative for adenopathy.  Psychiatric/Behavioral: Negative for confusion.       Objective:   Physical Exam  Constitutional: He  appears well-developed and well-nourished.  Cardiovascular: Normal rate and regular rhythm.   Pulmonary/Chest: Effort normal and breath sounds normal. No respiratory distress. He has no wheezes. He has no rales.  Skin:       Patient has a large area of erythema which is warm to palpation and tender - which is approximately 20 x 15 cm left flank. No pustules. No vesicles.          Assessment & Plan:  Cellulitis left flank. Patient is nontoxic. Ceftriaxone 1 g IM. Keflex 500 mg 3 times a day for 10 days. Followup with primary early next week to reassess and sooner as needed. We elected not to cover with Septra or doxycycline since was recently on Septra with progression of rash and this should have had good coverage for MRSA.

## 2012-02-21 NOTE — Telephone Encounter (Signed)
Pt wife calling 4:50 pm to report her husband has been shaking for approx 40 minutes, and his shaking is more severe also.  His temp is 100.2 and he has had Tylenol.  Wife reports he had the shakes prior to OV today, however the episodes never lasted this long.  She tried to take his BP and pulse however he was shaking too hard, did not work.  Questioning if Dr Caryl Never feels they should do anything tonight if this continues.  They are aware of the Sat clinic tomorrow.  Paulette's number is [phone]  15 min later called pt wife back and she was able to report he had improved greatly, not shaking near as hard his BP is 160/66, pulse 80, temp 100.8 and he just took 2 tylenol.

## 2012-02-24 ENCOUNTER — Encounter: Payer: Self-pay | Admitting: Internal Medicine

## 2012-02-24 ENCOUNTER — Ambulatory Visit (INDEPENDENT_AMBULATORY_CARE_PROVIDER_SITE_OTHER): Payer: Medicare Other | Admitting: Internal Medicine

## 2012-02-24 VITALS — BP 110/70 | Temp 97.6°F | Wt 206.0 lb

## 2012-02-24 DIAGNOSIS — L0390 Cellulitis, unspecified: Secondary | ICD-10-CM

## 2012-02-24 DIAGNOSIS — L0291 Cutaneous abscess, unspecified: Secondary | ICD-10-CM

## 2012-02-24 MED ORDER — GABAPENTIN 600 MG PO TABS: 600.0000 mg | ORAL_TABLET | Freq: Three times a day (TID) | ORAL | Status: DC

## 2012-02-24 NOTE — Progress Notes (Signed)
  Subjective:    Patient ID: [name] [name], male    DOB: [birth year], 76 y.o.   MRN: [id number]  HPI  76 year old patient who is seen today for followup of cellulitis involving his left flank area. He was initially seen 5 days ago with an area of patchy erythema involving left flank area he was on sulfa antibiotics at that time and it was felt that this possibly was a sulfa allergy antibiotic was discontinued and he was seen 2 days later with a worsening rash involving the left flank associated with fever. He received a injection of Rocephin and has been on cephalexin 500 mg 3 times daily; the  following day he felt quite poorly with fever of 102 with chills and severe malaise. Yesterday he improved and today he feels quite well. Temperature today 97.6    Review of Systems  Constitutional: Positive for fever, chills and fatigue. Negative for appetite change.  HENT: Negative for hearing loss, ear pain, congestion, sore throat, trouble swallowing, neck stiffness, dental problem, voice change and tinnitus.   Eyes: Negative for pain, discharge and visual disturbance.  Respiratory: Negative for cough, chest tightness, wheezing and stridor.   Cardiovascular: Negative for chest pain, palpitations and leg swelling.  Gastrointestinal: Negative for nausea, vomiting, abdominal pain, diarrhea, constipation, blood in stool and abdominal distention.  Genitourinary: Negative for urgency, hematuria, flank pain, discharge, difficulty urinating and genital sores.  Musculoskeletal: Negative for myalgias, back pain, joint swelling, arthralgias and gait problem.  Skin: Positive for rash.  Neurological: Negative for dizziness, syncope, speech difficulty, weakness, numbness and headaches.  Hematological: Negative for adenopathy. Does not bruise/bleed easily.  Psychiatric/Behavioral: Negative for behavioral problems and dysphoric mood. The patient is not nervous/anxious.        Objective:   Physical Exam    Constitutional: He appears well-developed and well-nourished. No distress.       Afebrile. No distress. Blood pressure 120/72. Pulse rate and normal. Nontoxic  Skin: There is erythema.       Large area of patchy erythema involving the left flank area. No tenderness or fluctuance          Assessment & Plan:   Cellulitis left flank. Improved History of the tick bite.  We'll continue  Cephalexin;  he will report any deterioration. Otherwise return in 3 months for general medical followup

## 2012-02-24 NOTE — Patient Instructions (Signed)
Take your antibiotic as prescribed until ALL of it is gone, but stop if you develop a rash, swelling, or any side effects of the medication.  Contact our office as soon as possible if  there are side effects of the medication.  Call or return to clinic prn if these symptoms worsen or fail to improve as anticipated.  

## 2012-03-06 ENCOUNTER — Other Ambulatory Visit: Payer: Self-pay | Admitting: Urology

## 2012-03-17 ENCOUNTER — Encounter (HOSPITAL_COMMUNITY): Payer: Self-pay | Admitting: Pharmacy Technician

## 2012-03-23 ENCOUNTER — Encounter (HOSPITAL_COMMUNITY)
Admission: RE | Admit: 2012-03-23 | Discharge: 2012-03-23 | Disposition: A | Discharge status: Home / Self Care | Payer: Medicare Other | Source: Ambulatory Visit | Attending: Urology | Admitting: Urology

## 2012-03-23 ENCOUNTER — Encounter (HOSPITAL_COMMUNITY): Payer: Self-pay

## 2012-03-23 ENCOUNTER — Ambulatory Visit (HOSPITAL_COMMUNITY)
Admission: RE | Admit: 2012-03-23 | Discharge: 2012-03-23 | Disposition: A | Discharge status: Home / Self Care | Payer: Medicare Other | Source: Ambulatory Visit | Attending: Urology | Admitting: Urology

## 2012-03-23 DIAGNOSIS — Z01818 Encounter for other preprocedural examination: Secondary | ICD-10-CM | POA: Insufficient documentation

## 2012-03-23 DIAGNOSIS — Z01812 Encounter for preprocedural laboratory examination: Secondary | ICD-10-CM | POA: Insufficient documentation

## 2012-03-23 DIAGNOSIS — E119 Type 2 diabetes mellitus without complications: Secondary | ICD-10-CM | POA: Insufficient documentation

## 2012-03-23 HISTORY — DX: Cellulitis, unspecified: L03.90

## 2012-03-23 HISTORY — DX: Calculus of kidney: N20.0

## 2012-03-23 HISTORY — DX: Other specified postprocedural states: R11.2

## 2012-03-23 HISTORY — DX: Unspecified mononeuropathy of left lower limb: G57.92

## 2012-03-23 HISTORY — DX: Personal history of other diseases of the nervous system and sense organs: Z86.69

## 2012-03-23 HISTORY — DX: Other specified postprocedural states: Z98.890

## 2012-03-23 LAB — CBC
HCT: 41.3 % (ref 39.0–52.0)
Hemoglobin: 14.1 g/dL (ref 13.0–17.0)
MCH: 30.1 pg (ref 26.0–34.0)
MCHC: 34.1 g/dL (ref 30.0–36.0)
MCV: 88.1 fL (ref 78.0–100.0)
Platelets: 194 10*3/uL (ref 150–400)
RBC: 4.69 MIL/uL (ref 4.22–5.81)
RDW: 14.5 % (ref 11.5–15.5)
WBC: 7.6 10*3/uL (ref 4.0–10.5)

## 2012-03-23 LAB — SURGICAL PCR SCREEN
MRSA, PCR: NEGATIVE
Staphylococcus aureus: NEGATIVE

## 2012-03-23 LAB — BASIC METABOLIC PANEL
BUN: 33 mg/dL — ABNORMAL HIGH (ref 6–23)
CO2: 25 mEq/L (ref 19–32)
Calcium: 9.7 mg/dL (ref 8.4–10.5)
Chloride: 103 mEq/L (ref 96–112)
Creatinine, Ser: 0.75 mg/dL (ref 0.50–1.35)
GFR calc Af Amer: >90 mL/min (ref >90)
GFR calc non Af Amer: 89 mL/min — ABNORMAL LOW (ref >90)
Glucose, Bld: 99 mg/dL (ref 70–99)
Potassium: 4.2 mEq/L (ref 3.5–5.1)
Sodium: 137 mEq/L (ref 135–145)

## 2012-03-23 NOTE — Patient Instructions (Addendum)
22 [name] [name]  03/23/2012   Your procedure is scheduled on:  03-27-2012  Report to [facility] at 0700 AM.  Call this number if you have problems the morning of surgery: [phone]   Remember:   Do not eat food or drink liquids:After Midnight.  .  Take these medicines the morning of surgery with A SIP OF WATER: neurontin, flonase nasal spray if needed, eye drop if needed   Do not wear jewelry or make up.  Do not wear lotions, powders, or perfumes.Do not wear deodorant.    Do not bring valuables to the hospital.  Contacts, dentures or bridgework may not be worn into surgery.  Leave suitcase in the car. After surgery it may be brought to your room.  For patients admitted to the hospital, checkout time is 11:00 AM the day of discharge.     Special Instructions: CHG Shower Use Special Wash: 1/2 bottle night before surgery and 1/2 bottle morning of surgery, use regular soap on face and front and back private area.   Please read over the following fact sheets that you were given: MRSA Information  [name] [name] [facility] pre op nurse phone number [phone], call if needed

## 2012-03-23 NOTE — Progress Notes (Signed)
06-12-2011 ekg epic and on chart

## 2012-03-24 NOTE — Pre-Procedure Instructions (Signed)
bmet results faxed to dr Vernie Ammons, fax confirmation received and placed on pt chart

## 2012-03-26 NOTE — H&P (Signed)
istory of Present Illness  BPH with bladder outlet obstruction: This has, in the past, resulted in an elevated PVR. His outlet obstruction is fairly significant and we have talked about possible transurethral resection of his prostate in the past. He wanted to try maximum medical management and observation, since it was not bothering him and he was not having recurrent infections or other difficulty.  He continues to take to tamsulosin at this time and in addition he remains on a 5-alpha reductase inhibitor.          He has a known left lower pole renal calculus. It measured 16 x 9 mm by KUB in 01/08 and he has been maintained on Urocit-K 10 mEq b.i.d.. He denies any flank pain or hematuria.  As far as his voiding goes he remained stable and he does not want to consider any form of surgical intervention at this time.   Interval history: He had a recent Escherichia coli UTI which has not been treated. He is asymptomatic from that.   Past Medical History Problems  1. History of  Arthritis V13.4 2. History of  Hypercholesterolemia 272.0 3. History of  PSA,Elevated 790.93  Surgical History Problems  1. History of  Appendectomy 2. History of  Biopsy Of The Prostate Needle 3. History of  Foot Surgery Left 4. History of  Lithotripsy 5. History of  Shoulder Arthroplasty Total Shoulder Replacement Right  Current Meds 1. Aspirin 81 MG Oral Tablet; Therapy: (Recorded:31Jan2008) to 2. Benazepril-Hydrochlorothiazide 20-12.5 MG Oral Tablet; Therapy: (Recorded:17Jan2011) to 3. CeleBREX CAPS; Therapy: (Recorded:17Jan2011) to 4. Chondroitin Sulfate CAPS; Therapy: (Recorded:31Jan2008) to 5. Claritin 10 MG Oral Tablet; Therapy: (Recorded:31Jan2008) to 6. Co Q10 100 MG TABS; Therapy: (Recorded:31Jan2008) to 7. Fluticasone Propionate 50 MCG/ACT Nasal Suspension; Therapy: 26Sep2012 to 8. Gabapentin 300 MG Oral Capsule; Therapy: 23Apr2013 to 9. Glucosamine 1500 Complex Oral Capsule; Therapy:  (Recorded:31Jan2008) to 10. Krill Oil CAPS; Therapy: (Recorded:17Jan2011) to 11. MSM CAPS; Therapy: (Recorded:31Jan2008) to 12. Multi Vitamin Mens Oral Tablet; Therapy: (Recorded:31Jan2008) to 13. Omega-3 CAPS; Therapy: (Recorded:31Jan2008) to 14. Proscar 5 MG Oral Tablet; Therapy: (Recorded:25Sep2012) to 15. Sulfamethoxazole-TMP DS 800-160 MG Oral Tablet; TAKE 1 TABLET TWICE DAILY; Therapy:   29May2013 to (Evaluate:12Jun2013)  Requested for: 29May2013; Last Rx:29May2013 16. Tamsulosin HCl 0.4 MG Oral Capsule; Therapy: (Recorded:26Jul2011) to 17. Uribel 118 MG Oral Capsule; TAKE 1 CAPSULE Every 6 hours prn bladder pain, frequency,   urgency; Therapy: 29May2013 to (Evaluate:12Jun2013); Last Rx:29May2013 18. Urocit-K 10 10 MEQ (1080 MG) Oral Tablet Extended Release; Therapy: (Recorded:25Sep2012)   to 19. Viagra 100 MG Oral Tablet; Take 1 tablet as needed; Therapy: 01Aug2011 to   (Evaluate:12Oct2011); Last Rx:01Aug2011 20. Vitamin B Complex-C CAPS; Therapy: (Recorded:17Jan2011) to 21. Zocor 40 MG Oral Tablet; Therapy: (Recorded:31Jan2008) to  Allergies Medication  1. No Known Drug Allergies  Social History Problems  1. Marital History - Currently Married 2. Never A Smoker Denied  3. History of  Alcohol Use 4. History of  Tobacco Use  Vitals Vital Signs BMI Calculated: 28.63 BSA Calculated: 2.09 Height: 5 ft 10 in Weight: 200 lb  Blood Pressure: 111 / 55 Heart Rate: 61  Review of Systems Genitourinary, constitutional, skin, eye, otolaryngeal, hematologic/lymphatic, cardiovascular, pulmonary, endocrine, musculoskeletal, gastrointestinal, neurological and psychiatric system(s) were reviewed and pertinent findings if present are noted.  Genitourinary: urinary frequency, feelings of urinary urgency, dysuria, nocturia, difficulty starting the urinary stream, weak urinary stream, urinary stream starts and stops, incomplete emptying of bladder and initiating urination requires  straining.   Physical  Exam Constitutional: Well nourished and well developed . No acute distress.  ENT:. The ears and nose are normal in appearance.  Neck: The appearance of the neck is normal and no neck mass is present.  Pulmonary: No respiratory distress and normal respiratory rhythm and effort.  Cardiovascular: Heart rate and rhythm are normal . No peripheral edema.  Abdomen: The abdomen is mildly obese. The abdomen is soft and nontender. No masses are palpated. No CVA tenderness. No hernias are palpable. No hepatosplenomegaly noted.  Genitourinary: Examination of the penis demonstrates no discharge, no masses, no lesions and a normal meatus. The penis is circumcised. The scrotum is without lesions. The right vas deferens is is palpably normal. The left vas deferens is palpably normal. The right epididymis is palpably normal and non-tender. The left epididymis is palpably normal and non-tender. The right testis is non-tender and without masses. The left testis is non-tender and without masses.  Lymphatics: The femoral and inguinal nodes are not enlarged or tender.  Skin: Normal skin turgor, no visible rash and no visible skin lesions.  Neuro/Psych:. Mood and affect are appropriate.   Assessment Assessed  1. Benign Prostatic Hypertrophy With Urinary Obstruction 600.01 2. Incomplete Emptying Of Bladder 788.21   We had a long discussion today about the fact that he is carrying a slight residual. He has a very large prostate that has increased in size over time and he is now beginning to have more than just his symptoms but actually having infections. I told him that I reviewed his ultrasound PVR is over the years and do note he has had fairly significant increase in size of his prostate gland. Because of the large size of his prostate and the fact that he's been having the difficulties that he has we discussed surgical options. He would not be a candidate for the minimally invasive procedures  such as transurethral microwave. We discussed an open prostatectomy versus TURP. I went over the pluses and minuses of each. I told him that I would be willing to try and resect his prostate transurethrally with the understanding that it may require a second procedure in order to get him completely opened up due to his large prostate size. I went over the procedure with him in detail and also gave him written information about the procedure. I have discussed all the complications and the risks associated with this procedure. I have answered all of his questions and he has elected to proceed.   Plan    He will be scheduled for a transurethral resection of his prostate.

## 2012-03-27 ENCOUNTER — Ambulatory Visit (HOSPITAL_COMMUNITY)
Admission: RE | Admit: 2012-03-27 | Discharge: 2012-03-28 | Disposition: A | Discharge status: Home / Self Care | Payer: Medicare Other | Source: Ambulatory Visit | Attending: Urology | Admitting: Urology

## 2012-03-27 ENCOUNTER — Encounter (HOSPITAL_COMMUNITY)
Admission: RE | Disposition: A | Discharge status: Home / Self Care | Payer: Self-pay | Source: Ambulatory Visit | Attending: Urology

## 2012-03-27 ENCOUNTER — Encounter (HOSPITAL_COMMUNITY): Payer: Self-pay | Admitting: *Deleted

## 2012-03-27 ENCOUNTER — Encounter (HOSPITAL_COMMUNITY): Payer: Self-pay | Admitting: Anesthesiology

## 2012-03-27 ENCOUNTER — Ambulatory Visit (HOSPITAL_COMMUNITY): Payer: Medicare Other | Admitting: Anesthesiology

## 2012-03-27 DIAGNOSIS — N401 Enlarged prostate with lower urinary tract symptoms: Secondary | ICD-10-CM | POA: Insufficient documentation

## 2012-03-27 DIAGNOSIS — N3289 Other specified disorders of bladder: Secondary | ICD-10-CM | POA: Insufficient documentation

## 2012-03-27 DIAGNOSIS — E7800 Pure hypercholesterolemia, unspecified: Secondary | ICD-10-CM | POA: Insufficient documentation

## 2012-03-27 DIAGNOSIS — R339 Retention of urine, unspecified: Secondary | ICD-10-CM | POA: Insufficient documentation

## 2012-03-27 DIAGNOSIS — Z79899 Other long term (current) drug therapy: Secondary | ICD-10-CM | POA: Insufficient documentation

## 2012-03-27 DIAGNOSIS — Z7982 Long term (current) use of aspirin: Secondary | ICD-10-CM | POA: Insufficient documentation

## 2012-03-27 DIAGNOSIS — N138 Other obstructive and reflux uropathy: Secondary | ICD-10-CM | POA: Diagnosis present

## 2012-03-27 DIAGNOSIS — N320 Bladder-neck obstruction: Secondary | ICD-10-CM | POA: Insufficient documentation

## 2012-03-27 DIAGNOSIS — N400 Enlarged prostate without lower urinary tract symptoms: Secondary | ICD-10-CM | POA: Diagnosis present

## 2012-03-27 HISTORY — PX: TRANSURETHRAL RESECTION OF PROSTATE: SHX73

## 2012-03-27 SURGERY — TRANSURETHRAL RESECTION OF THE PROSTATE WITH GYRUS INSTRUMENTS
Anesthesia: General | Wound class: Clean Contaminated

## 2012-03-27 MED ORDER — SODIUM CHLORIDE 0.9 % IR SOLN
Status: DC | PRN
  Administered 2012-03-27: 3000 mL

## 2012-03-27 MED ORDER — HYDROCODONE-ACETAMINOPHEN 5-325 MG PO TABS
1.0000 | ORAL_TABLET | ORAL | Status: DC | PRN
  Administered 2012-03-27: 2 via ORAL
  Administered 2012-03-27: 1 via ORAL
  Administered 2012-03-28: 2 via ORAL
  Filled 2012-03-27 (×2): qty 2
  Filled 2012-03-27: qty 1

## 2012-03-27 MED ORDER — HYDROMORPHONE HCL PF 1 MG/ML IJ SOLN
INTRAMUSCULAR | Status: DC | PRN
  Administered 2012-03-27: 0.5 mg via INTRAVENOUS

## 2012-03-27 MED ORDER — BACITRACIN-NEOMYCIN-POLYMYXIN 400-5-5000 EX OINT: 1.0000 "application " | TOPICAL_OINTMENT | Freq: Three times a day (TID) | CUTANEOUS | Status: DC | PRN

## 2012-03-27 MED ORDER — SODIUM CHLORIDE 0.9 % IR SOLN
3000.0000 mL | Status: DC
  Administered 2012-03-27: 3000 mL

## 2012-03-27 MED ORDER — DEXAMETHASONE SODIUM PHOSPHATE 4 MG/ML IJ SOLN
INTRAMUSCULAR | Status: DC | PRN
  Administered 2012-03-27: 10 mg via INTRAVENOUS

## 2012-03-27 MED ORDER — LACTATED RINGERS IV SOLN
INTRAVENOUS | Status: DC | PRN
  Administered 2012-03-27 (×2): via INTRAVENOUS

## 2012-03-27 MED ORDER — CIPROFLOXACIN IN D5W 200 MG/100ML IV SOLN
200.0000 mg | Freq: Once | INTRAVENOUS | Status: AC
  Administered 2012-03-27: 200 mg via INTRAVENOUS
  Filled 2012-03-27: qty 100

## 2012-03-27 MED ORDER — HYDROMORPHONE HCL PF 1 MG/ML IJ SOLN
0.2500 mg | INTRAMUSCULAR | Status: DC | PRN
  Administered 2012-03-27: 0.5 mg via INTRAVENOUS

## 2012-03-27 MED ORDER — BELLADONNA ALKALOIDS-OPIUM 16.2-60 MG RE SUPP
RECTAL | Status: AC
  Filled 2012-03-27: qty 1

## 2012-03-27 MED ORDER — FENTANYL CITRATE 0.05 MG/ML IJ SOLN
INTRAMUSCULAR | Status: DC | PRN
  Administered 2012-03-27: 100 ug via INTRAVENOUS

## 2012-03-27 MED ORDER — PROPOFOL 10 MG/ML IV EMUL
INTRAVENOUS | Status: DC | PRN
  Administered 2012-03-27: 200 mg via INTRAVENOUS

## 2012-03-27 MED ORDER — LIDOCAINE HCL (CARDIAC) 20 MG/ML IV SOLN
INTRAVENOUS | Status: DC | PRN
  Administered 2012-03-27: 30 mg via INTRAVENOUS

## 2012-03-27 MED ORDER — 0.9 % SODIUM CHLORIDE (POUR BTL) OPTIME
TOPICAL | Status: DC | PRN
  Administered 2012-03-27: 2000 mL

## 2012-03-27 MED ORDER — STERILE WATER FOR IRRIGATION IR SOLN
Status: DC | PRN
  Administered 2012-03-27: 500 mL

## 2012-03-27 MED ORDER — ACETAMINOPHEN 325 MG PO TABS: 650.0000 mg | ORAL_TABLET | ORAL | Status: DC | PRN

## 2012-03-27 MED ORDER — BELLADONNA ALKALOIDS-OPIUM 16.2-60 MG RE SUPP
RECTAL | Status: DC | PRN
  Administered 2012-03-27: 1 via RECTAL

## 2012-03-27 MED ORDER — CIPROFLOXACIN IN D5W 400 MG/200ML IV SOLN
INTRAVENOUS | Status: AC
  Filled 2012-03-27: qty 200

## 2012-03-27 MED ORDER — PHENOL 1.4 % MT LIQD
1.0000 | OROMUCOSAL | Status: DC | PRN
  Filled 2012-03-27: qty 177

## 2012-03-27 MED ORDER — ACETAMINOPHEN 10 MG/ML IV SOLN
INTRAVENOUS | Status: AC
  Filled 2012-03-27: qty 100

## 2012-03-27 MED ORDER — CIPROFLOXACIN IN D5W 400 MG/200ML IV SOLN
400.0000 mg | INTRAVENOUS | Status: AC
  Administered 2012-03-27: 400 mg via INTRAVENOUS

## 2012-03-27 MED ORDER — ACETAMINOPHEN 10 MG/ML IV SOLN
INTRAVENOUS | Status: DC | PRN
  Administered 2012-03-27: 1000 mg via INTRAVENOUS

## 2012-03-27 MED ORDER — MENTHOL 3 MG MT LOZG
1.0000 | LOZENGE | OROMUCOSAL | Status: DC | PRN
  Filled 2012-03-27: qty 9

## 2012-03-27 MED ORDER — LACTATED RINGERS IV SOLN: INTRAVENOUS | Status: DC

## 2012-03-27 MED ORDER — ZOLPIDEM TARTRATE 5 MG PO TABS: 5.0000 mg | ORAL_TABLET | Freq: Every evening | ORAL | Status: DC | PRN

## 2012-03-27 MED ORDER — ONDANSETRON HCL 4 MG/2ML IJ SOLN
INTRAMUSCULAR | Status: DC | PRN
  Administered 2012-03-27 (×2): 2 mg via INTRAVENOUS

## 2012-03-27 MED ORDER — ONDANSETRON HCL 4 MG/2ML IJ SOLN: 4.0000 mg | INTRAMUSCULAR | Status: DC | PRN

## 2012-03-27 MED ORDER — HYDROMORPHONE HCL PF 1 MG/ML IJ SOLN
INTRAMUSCULAR | Status: AC
  Filled 2012-03-27: qty 1

## 2012-03-27 MED ORDER — SODIUM CHLORIDE 0.9 % IV SOLN
INTRAVENOUS | Status: DC
  Administered 2012-03-27: 14:00:00 via INTRAVENOUS

## 2012-03-27 MED ORDER — BELLADONNA ALKALOIDS-OPIUM 16.2-60 MG RE SUPP
1.0000 | Freq: Four times a day (QID) | RECTAL | Status: DC | PRN
  Administered 2012-03-27 – 2012-03-28 (×2): 1 via RECTAL
  Filled 2012-03-27 (×2): qty 1

## 2012-03-27 MED ORDER — PROMETHAZINE HCL 25 MG/ML IJ SOLN: 6.2500 mg | INTRAMUSCULAR | Status: DC | PRN

## 2012-03-27 MED ORDER — LACTATED RINGERS IV SOLN
INTRAVENOUS | Status: DC
  Administered 2012-03-27: 1000 mL via INTRAVENOUS

## 2012-03-27 SURGICAL SUPPLY — 25 items
BAG URINE DRAINAGE (UROLOGICAL SUPPLIES) ×2 IMPLANT
BAG URO CATCHER STRL LF (DRAPE) ×3 IMPLANT
CATH FOLEY 3WAY 30CC 24FR (CATHETERS) ×3
CATH URTH STD 24FR FL 3W 2 (CATHETERS) ×1 IMPLANT
CLOTH BEACON ORANGE TIMEOUT ST (SAFETY) ×3 IMPLANT
DRAPE CAMERA CLOSED 9X96 (DRAPES) ×3 IMPLANT
ELECT LOOP MED HF 24F 12D CBL (CLIP) ×2 IMPLANT
ELECT REM PT RETURN 9FT ADLT (ELECTROSURGICAL)
ELECTRODE REM PT RTRN 9FT ADLT (ELECTROSURGICAL) ×1 IMPLANT
EVACUATOR MICROVAS BLADDER (UROLOGICAL SUPPLIES) ×2 IMPLANT
GLOVE BIOGEL M 8.0 STRL (GLOVE) ×3 IMPLANT
GLOVE SURG SS PI 7.0 STRL IVOR (GLOVE) ×6 IMPLANT
GOWN PREVENTION PLUS XLARGE (GOWN DISPOSABLE) ×3 IMPLANT
GOWN STRL NON-REIN LRG LVL3 (GOWN DISPOSABLE) ×6 IMPLANT
GOWN STRL REIN XL XLG (GOWN DISPOSABLE) ×1 IMPLANT
HOLDER FOLEY CATH W/STRAP (MISCELLANEOUS) IMPLANT
JUMPSUIT BLUE BOOT COVER DISP (PROTECTIVE WEAR) IMPLANT
KIT ASPIRATION TUBING (SET/KITS/TRAYS/PACK) ×3 IMPLANT
LOOPS RESECTOSCOPE DISP (ELECTROSURGICAL) ×1 IMPLANT
MANIFOLD NEPTUNE II (INSTRUMENTS) ×3 IMPLANT
NS IRRIG 1000ML POUR BTL (IV SOLUTION) ×3 IMPLANT
PACK CYSTO (CUSTOM PROCEDURE TRAY) ×3 IMPLANT
SYR 30ML LL (SYRINGE) ×2 IMPLANT
SYRINGE IRR TOOMEY STRL 70CC (SYRINGE) ×2 IMPLANT
TUBING CONNECTING 10 (TUBING) ×3 IMPLANT

## 2012-03-27 NOTE — Anesthesia Postprocedure Evaluation (Signed)
Anesthesia Post Note  Patient: [name] [name]  Procedure(s) Performed: Procedure(s) (LRB): TRANSURETHRAL RESECTION OF THE PROSTATE WITH GYRUS INSTRUMENTS ()  Anesthesia type: General  Patient location: PACU  Post pain: Pain level controlled  Post assessment: Post-op Vital signs reviewed  Last Vitals:  Filed Vitals:   03/27/12 1200  BP: 131/64  Pulse: 55  Temp:   Resp: 12    Post vital signs: Reviewed  Level of consciousness: sedated  Complications: No apparent anesthesia complications

## 2012-03-27 NOTE — Anesthesia Preprocedure Evaluation (Addendum)
Anesthesia Evaluation  Patient identified by MRN, date of birth, ID band Patient awake    Reviewed: Allergy & Precautions, H&P , NPO status , Patient's Chart, lab work & pertinent test results  History of Anesthesia Complications (+) PONV  Airway Mallampati: II TM Distance: >3 FB Neck ROM: Full    Dental  (+) Teeth Intact   Pulmonary neg pulmonary ROS,  breath sounds clear to auscultation  Pulmonary exam normal       Cardiovascular hypertension, Pt. on medications negative cardio ROS  Rhythm:Regular Rate:Normal     Neuro/Psych  Neuromuscular disease negative psych ROS   GI/Hepatic negative GI ROS, Neg liver ROS,   Endo/Other  negative endocrine ROS  Renal/GU negative Renal ROS  negative genitourinary   Musculoskeletal negative musculoskeletal ROS (+)   Abdominal   Peds negative pediatric ROS (+)  Hematology negative hematology ROS (+)   Anesthesia Other Findings   Reproductive/Obstetrics negative OB ROS                          Anesthesia Physical Anesthesia Plan  ASA: II  Anesthesia Plan: General   Post-op Pain Management:    Induction: Intravenous  Airway Management Planned: Oral ETT  Additional Equipment:   Intra-op Plan:   Post-operative Plan: Extubation in OR  Informed Consent: I have reviewed the patients History and Physical, chart, labs and discussed the procedure including the risks, benefits and alternatives for the proposed anesthesia with the patient or authorized representative who has indicated his/her understanding and acceptance.   Dental advisory given  Plan Discussed with: CRNA  Anesthesia Plan Comments:         Anesthesia Quick Evaluation

## 2012-03-27 NOTE — Transfer of Care (Signed)
Immediate Anesthesia Transfer of Care Note  Patient: [name] [name]  Procedure(s) Performed: Procedure(s) (LRB): TRANSURETHRAL RESECTION OF THE PROSTATE WITH GYRUS INSTRUMENTS ()  Patient Location: PACU  Anesthesia Type: General  Level of Consciousness: awake, alert  and patient cooperative  Airway & Oxygen Therapy: Patient Spontanous Breathing and Patient connected to face mask oxygen  Post-op Assessment: Report given to PACU RN and Post -op Vital signs reviewed and stable  Post vital signs: Reviewed and stable  Complications: No apparent anesthesia complications

## 2012-03-27 NOTE — Progress Notes (Signed)
Pt c/o occ. Bladder spasm but otherwise is without complaint. Specifically no abdominal discomfort.  His abdomen is soft and nontender without suprapubic mass. His Foley catheter is on mild traction and draining slightly pink urine with no clots on CBI.  He appears to be doing well postoperatively. His bladder spasms have been controlled with medication.  Plan: Continue continuous bladder irrigation and Foley traction until tomorrow morning. His traction on the removed and then his catheter can come out for a voiding trial. I will anticipate discharge in the morning.

## 2012-03-27 NOTE — Op Note (Signed)
PATIENT:  [name] [name]  PRE-OPERATIVE DIAGNOSIS: BPH with outlet obstruction  POST-OPERATIVE DIAGNOSIS: Same  PROCEDURE:  Procedure(s): TRANSURETHRAL RESECTION OF prostate  SURGEON:  Surgeon(s): Garnett Farm  ANESTHESIA:   General  EBL:  200 mL  DRAINS: Urinary Catheter (24 Fr., three-way Foley)   SPECIMEN:  Prostate chips  DISPOSITION OF SPECIMEN:  PATHOLOGY  Indication: Mr. Bulson has a long history of BPH with outlet obstruction managed with maximal medical management. He recently developed an Escherichia coli UTI. He had considered transurethral resection of the prostate for a number of years and despite maximum medical management he has experienced an infection and an elevated PVR and therefore we discussed proceeding with a resection of his prostate. He has elected to proceed with surgical therapy.  Description of operation: The patient was taken to the operating room and administered general anesthesia. He was then placed on the table and moved to the dorsal lithotomy position after which his genitalia was sterilely prepped and draped. An official timeout was then performed.  The meatus was sounded with Sissy Hoff sounds to 30 [place]. The 26 French resectoscope with Timberlake obturator was then introduced into the bladder and the obturator was removed. The resectoscope element with 12 lens was then inserted and the bladder was fully and systematically inspected. Ureteral orifices were noted to be in the normal anatomic positions well away from the bladder neck. There was 2+ trabeculation.  I first began by resecting at the 6:00 level at the bladder neck back to the level of the veru. I then resected the left lobe and starting at the 6:00 position and resecting back to the veru in a counterclockwise direction. The right lobe was then resected in a similar fashion. I resected down to the surgical capsule on both sides. Bleeding points were cauterized as they were  encountered. I then resected away apical tissue with care being taken to remain proximal to the veru at all times. The Microvasive evacuator was then used to irrigate the bladder and remove all of the prostate chips which were sent to pathology. Reinspection revealed a well resected prostatic urethra with no evidence of prostatic capsular perforation or active arterial bleeding. I then removed the resectoscope.  A 24 French, three-way Foley catheter was then inserted in the bladder and irrigated. The irrigant returned slightly pink with no clots. This was connected to continuous irrigation and closed system drainage. The patient received a B&O suppository. The patient was awakened and taken to the recovery room.  PLAN OF CARE: He will be observed overnight with anticipation of discharge in the morning.  PATIENT DISPOSITION:  PACU - hemodynamically stable.

## 2012-03-27 NOTE — Interval H&P Note (Signed)
History and Physical Interval Note:  03/27/2012 10:07 AM  [name] [name]  has presented today for surgery, with the diagnosis of benign prostate hyperplasia with outlet obstruction  The various methods of treatment have been discussed with the patient and family. After consideration of risks, benefits and other options for treatment, the patient has consented to  Procedure(s) (LRB): TRANSURETHRAL RESECTION OF THE PROSTATE WITH GYRUS INSTRUMENTS () as a surgical intervention .  The patient's history has been reviewed, patient examined, no change in status, stable for surgery.  I have reviewed the patients' chart and labs.  Questions were answered to the patient's satisfaction.     Garnett Farm

## 2012-03-27 NOTE — Transfer of Care (Signed)
Immediate Anesthesia Transfer of Care Note  Patient: [name] [name]  Procedure(s) Performed: Procedure(s) (LRB): TRANSURETHRAL RESECTION OF THE PROSTATE WITH GYRUS INSTRUMENTS ()  Patient Location: PACU  Anesthesia Type: General  Level of Consciousness: awake and patient cooperative  Airway & Oxygen Therapy: Patient Spontanous Breathing and Patient connected to face mask oxygen  Post-op Assessment: Report given to PACU RN and Post -op Vital signs reviewed and stable  Post vital signs: Reviewed and stable  Complications: No apparent anesthesia complications

## 2012-03-28 MED ORDER — OXYBUTYNIN CHLORIDE ER 15 MG PO TB24: 15.0000 mg | ORAL_TABLET | Freq: Every day | ORAL | Status: DC

## 2012-03-28 MED ORDER — HYDROCODONE-ACETAMINOPHEN 10-325 MG PO TABS: 1.0000 | ORAL_TABLET | Freq: Four times a day (QID) | ORAL | Status: AC | PRN

## 2012-03-28 MED ORDER — PHENAZOPYRIDINE HCL 200 MG PO TABS: 200.0000 mg | ORAL_TABLET | Freq: Three times a day (TID) | ORAL | Status: AC | PRN

## 2012-03-28 NOTE — Discharge Summary (Signed)
  Outpatient with extended recovery:  Subjective: The patient reports some urgency and small frequent voidings once his catheter was removed this morning. He is having some hematuria and initially had some clots. He denies any significant pain.  Objective: He has no abdominal tenderness or mass. His urine is bloody but appears to have no clots.  Impression: Status post TURP for BPH with bladder outlet obstruction. He is doing well and we have discussed his postoperative/post hospital care. I am going to prescribe an anticholinergic for the short term. This should help with his irritative symptoms such as frequency and urgency.  Plan: Discharge home.  Admission diagnoses BPH with outlet obstruction. Discharge diagnosis: same Medications, activity, diet and followup per notes in hospital chart.

## 2012-03-30 ENCOUNTER — Encounter (HOSPITAL_COMMUNITY): Payer: Self-pay | Admitting: Urology

## 2012-04-21 ENCOUNTER — Encounter: Payer: Self-pay | Admitting: Internal Medicine

## 2012-04-21 ENCOUNTER — Ambulatory Visit (INDEPENDENT_AMBULATORY_CARE_PROVIDER_SITE_OTHER): Payer: Medicare Other | Admitting: Internal Medicine

## 2012-04-21 VITALS — BP 112/70 | Temp 98.6°F | Wt 202.0 lb

## 2012-04-21 DIAGNOSIS — I10 Essential (primary) hypertension: Secondary | ICD-10-CM

## 2012-04-21 DIAGNOSIS — J069 Acute upper respiratory infection, unspecified: Secondary | ICD-10-CM

## 2012-04-21 DIAGNOSIS — J309 Allergic rhinitis, unspecified: Secondary | ICD-10-CM

## 2012-04-21 MED ORDER — HYDROCODONE-HOMATROPINE 5-1.5 MG/5ML PO SYRP: 5.0000 mL | ORAL_SOLUTION | Freq: Four times a day (QID) | ORAL | Status: AC | PRN

## 2012-04-21 NOTE — Progress Notes (Signed)
  Subjective:    Patient ID: [name] [name], male    DOB: 1936/08/12, 76 y.o.   MRN: [id number]  HPI  76 year old patient who has a history of allergic rhinitis and treated hypertension. He has a history of BPH and status post TURP last month. The past week he has had head and chest congestion and largely nonproductive cough he has noted some occasional wheezing. Blood pressure treatment includes ACE inhibition. No fever chills shortness of breath or chest    Review of Systems  Constitutional: Positive for fatigue. Negative for fever, chills and appetite change.  HENT: Positive for congestion. Negative for hearing loss, ear pain, sore throat, trouble swallowing, neck stiffness, dental problem, voice change and tinnitus.   Eyes: Negative for pain, discharge and visual disturbance.  Respiratory: Positive for cough. Negative for chest tightness, wheezing and stridor.   Cardiovascular: Negative for chest pain, palpitations and leg swelling.  Gastrointestinal: Negative for nausea, vomiting, abdominal pain, diarrhea, constipation, blood in stool and abdominal distention.  Genitourinary: Negative for urgency, hematuria, flank pain, discharge, difficulty urinating and genital sores.  Musculoskeletal: Negative for myalgias, back pain, joint swelling, arthralgias and gait problem.  Skin: Negative for rash.  Neurological: Negative for dizziness, syncope, speech difficulty, weakness, numbness and headaches.  Hematological: Negative for adenopathy. Does not bruise/bleed easily.  Psychiatric/Behavioral: Negative for behavioral problems and dysphoric mood. The patient is not nervous/anxious.        Objective:   Physical Exam  Constitutional: He is oriented to person, place, and time. He appears well-developed and well-nourished. No distress.  HENT:  Head: Normocephalic.  Right Ear: External ear normal.  Left Ear: External ear normal.  Eyes: Conjunctivae and EOM are normal.  Neck: Normal range of  motion.  Cardiovascular: Normal rate and normal heart sounds.   Pulmonary/Chest: Breath sounds normal. No respiratory distress. He has no wheezes. He has no rales.  Abdominal: Bowel sounds are normal.  Musculoskeletal: Normal range of motion. He exhibits no edema and no tenderness.  Neurological: He is alert and oriented to person, place, and time.  Psychiatric: He has a normal mood and affect. His behavior is normal.          Assessment & Plan:   Viral URI with cough. We'll treat symptomatically Hypertension stable  Recheck 6 months or as needed

## 2012-04-21 NOTE — Patient Instructions (Addendum)
Get plenty of rest, Drink lots of  clear liquids, and use Tylenol or ibuprofen for fever and discomfort.    Call or return to clinic prn if these symptoms worsen or fail to improve as anticipated.  

## 2012-05-08 ENCOUNTER — Other Ambulatory Visit: Payer: Self-pay

## 2012-05-08 NOTE — Telephone Encounter (Signed)
Why?  

## 2012-05-08 NOTE — Telephone Encounter (Signed)
Rx request for hydrocodone-homatropine. Pt last seen 04/21/12.  Pls advise.

## 2012-05-11 MED ORDER — HYDROCODONE-HOMATROPINE 5-1.5 MG/5ML PO SYRP: 5.0000 mL | ORAL_SOLUTION | Freq: Four times a day (QID) | ORAL | Status: AC | PRN

## 2012-05-11 NOTE — Telephone Encounter (Signed)
Ok 6 oz 

## 2012-05-11 NOTE — Telephone Encounter (Signed)
Hycodan refill- requested from [facility] - last seen 04/21/12 head and chest congestion Last written 04/21/12 120 ml  Please advise

## 2012-05-11 NOTE — Telephone Encounter (Signed)
Called [facility] 

## 2012-06-15 ENCOUNTER — Other Ambulatory Visit: Payer: Self-pay

## 2012-06-15 MED ORDER — CELECOXIB 200 MG PO CAPS: 200.0000 mg | ORAL_CAPSULE | Freq: Every morning | ORAL | Status: DC

## 2012-06-15 MED ORDER — FLUTICASONE PROPIONATE 50 MCG/ACT NA SUSP: 1.0000 | Freq: Every day | NASAL | Status: DC | PRN

## 2012-08-20 ENCOUNTER — Encounter: Payer: Self-pay | Admitting: Internal Medicine

## 2012-08-20 ENCOUNTER — Ambulatory Visit (INDEPENDENT_AMBULATORY_CARE_PROVIDER_SITE_OTHER): Payer: Medicare Other | Admitting: Internal Medicine

## 2012-08-20 VITALS — BP 140/76 | HR 66 | Temp 97.8°F | Resp 18 | Ht 68.5 in | Wt 202.0 lb

## 2012-08-20 DIAGNOSIS — K625 Hemorrhage of anus and rectum: Secondary | ICD-10-CM

## 2012-08-20 DIAGNOSIS — M1990 Unspecified osteoarthritis, unspecified site: Secondary | ICD-10-CM

## 2012-08-20 DIAGNOSIS — Z87442 Personal history of urinary calculi: Secondary | ICD-10-CM

## 2012-08-20 DIAGNOSIS — Z0000 Encounter for general adult medical examination without abnormal findings: Secondary | ICD-10-CM

## 2012-08-20 DIAGNOSIS — I10 Essential (primary) hypertension: Secondary | ICD-10-CM

## 2012-08-20 DIAGNOSIS — E785 Hyperlipidemia, unspecified: Secondary | ICD-10-CM

## 2012-08-20 LAB — COMPREHENSIVE METABOLIC PANEL WITH GFR
ALT: 19 U/L (ref 0–53)
AST: 16 U/L (ref 0–37)
Albumin: 4.5 g/dL (ref 3.5–5.2)
Alkaline Phosphatase: 71 U/L (ref 39–117)
BUN: 30 mg/dL — ABNORMAL HIGH (ref 6–23)
CO2: 28 meq/L (ref 19–32)
Calcium: 9.2 mg/dL (ref 8.4–10.5)
Chloride: 103 meq/L (ref 96–112)
Creatinine, Ser: 0.8 mg/dL (ref 0.4–1.5)
GFR: 104.98 mL/min
Glucose, Bld: 105 mg/dL — ABNORMAL HIGH (ref 70–99)
Potassium: 4.1 meq/L (ref 3.5–5.1)
Sodium: 139 meq/L (ref 135–145)
Total Bilirubin: 1.1 mg/dL (ref 0.3–1.2)
Total Protein: 7.2 g/dL (ref 6.0–8.3)

## 2012-08-20 LAB — LIPID PANEL: Total CHOL/HDL Ratio: 3

## 2012-08-20 LAB — TSH: TSH: 2.35 u[IU]/mL (ref 0.35–5.50)

## 2012-08-20 MED ORDER — BENAZEPRIL-HYDROCHLOROTHIAZIDE 20-12.5 MG PO TABS: ORAL_TABLET | ORAL | Status: DC

## 2012-08-20 MED ORDER — GABAPENTIN 600 MG PO TABS: 600.0000 mg | ORAL_TABLET | Freq: Three times a day (TID) | ORAL | Status: DC

## 2012-08-20 MED ORDER — SIMVASTATIN 40 MG PO TABS: 40.0000 mg | ORAL_TABLET | Freq: Every day | ORAL | Status: DC

## 2012-08-20 MED ORDER — CELECOXIB 200 MG PO CAPS: 200.0000 mg | ORAL_CAPSULE | Freq: Every morning | ORAL | Status: DC

## 2012-08-20 MED ORDER — FLUTICASONE PROPIONATE 50 MCG/ACT NA SUSP: 1.0000 | Freq: Every day | NASAL | Status: DC | PRN

## 2012-08-20 NOTE — Patient Instructions (Signed)
Limit your sodium (Salt) intake  Please check your blood pressure on a regular basis.  If it is consistently greater than 150/90, please make an office appointment.  Return in 6 months for follow-up   

## 2012-08-20 NOTE — Progress Notes (Signed)
Patient ID: [name] [name], male   DOB: [birth year], 76 y.o.   MRN: [id number]  Subjective:    Patient ID: [name] [name], male    DOB: 11-22-1935, 76 y.o.   MRN: [id number]  Hypertension Pertinent negatives include no chest pain, headaches, neck pain, palpitations or shortness of breath.   History of Present Illness:   6 -year-old patient who is seen today for a wellness examination. He is followed by urology for BPH and has a history of nephrolithiasis. He has dyslipidemia osteoarthritis and hypertension. He is doing quite well and denies any cardiopulmonary complaints. Since his last visit here he has had a TURP in July  Here for Medicare AWV:   1. Risk factors based on Past M, S, F history: risk factors include hypertension, dyslipidemia, and a family history of cardiac disease  2. Physical Activities: remains quite active. He has 5 horses  and rides daily, also walks frequently  3. Depression/mood: no history of depression or mood disorder  4. Hearing: mild sensorineural hearing loss, left greater than the right  5. ADL's: independent in all aspects of daily living  6. Fall Risk: low  7. Home Safety: no problems identified  8. Height, weight, &visual acuity:height and weight normal and unchanged. No difficulty with visual acuity  9. Counseling: heart healthy diet and regular. Exercise encouraged as well as modest weight loss  10. Labs ordered based on risk factors: average her profile, including lipid profile will be reviewed  11. Referral Coordination- follow-up urology  12. Care Plan- heart healthy diet and weight loss, exercise regimen discussed and encouraged  13. Cognitive Assessment- alert and oriented normal affect. No cognitive dysfunction; handles all executive functions without difficulty   Allergies (verified) :  No Known Drug Allergies   Past History:  Past Medical History:  Reviewed history from 06/02/2008 and no changes required.  Hyperlipidemia   Nephrolithiasis, hx of  Osteoarthritis  Benign prostatic hypertrophy  Allergic rhinitis  systolic hypertension  external hemorrhoids  Hypertension   Past Surgical History:  Reviewed history from 05/16/2009 and no changes required.  Appendectomy  Inguinal herniorrhaphy  Lithotripsy  colonoscopy 2004  ETT 1-06  Cardiolye 6-09  TURP in July 2013  Family History:  Reviewed history from 05/16/2009 and no changes required.  father died at 63, cerebrovascular disease  mother died at age 93, MI  Three  sisters two brothers, positive for diabetes; one sister with COPD who is deceased; one brother  died of suicide death   Social History:  Reviewed history from 05/01/2007 and no changes required.  Married  No longer walks about 2 miles daily do to a left foot pain following trauma and complicated by left foot neuropathy. Quite active on a horse farm    Review of Systems  Constitutional: Negative for fever, chills, activity change, appetite change and fatigue.  HENT: Negative for hearing loss, ear pain, congestion, rhinorrhea, sneezing, mouth sores, trouble swallowing, neck pain, neck stiffness, dental problem, voice change, sinus pressure and tinnitus.   Eyes: Negative for photophobia, pain, redness and visual disturbance.  Respiratory: Negative for apnea, cough, choking, chest tightness, shortness of breath and wheezing.   Cardiovascular: Negative for chest pain, palpitations and leg swelling.  Gastrointestinal: Negative for nausea, vomiting, abdominal pain, diarrhea, constipation, blood in stool, abdominal distention, anal bleeding and rectal pain.  Genitourinary: Negative for dysuria, urgency, frequency, hematuria, flank pain, decreased urine volume, discharge, penile swelling, scrotal swelling, difficulty urinating, genital sores and testicular pain.  Musculoskeletal: Negative  for myalgias, back pain, joint swelling, arthralgias and gait problem.  Skin: Negative for color change,  rash and wound.  Neurological: Negative for dizziness, tremors, seizures, syncope, facial asymmetry, speech difficulty, weakness, light-headedness, numbness and headaches.  Hematological: Negative for adenopathy. Does not bruise/bleed easily.  Psychiatric/Behavioral: Negative for suicidal ideas, hallucinations, behavioral problems, confusion, sleep disturbance, self-injury, dysphoric mood, decreased concentration and agitation. The patient is not nervous/anxious.        Objective:   Physical Exam  Constitutional: He appears well-developed and well-nourished.  HENT:  Head: Normocephalic and atraumatic.  Right Ear: External ear normal.  Left Ear: External ear normal.  Nose: Nose normal.  Mouth/Throat: Oropharynx is clear and moist.  Eyes: Conjunctivae normal and EOM are normal. Pupils are equal, round, and reactive to light. No scleral icterus.  Neck: Normal range of motion. Neck supple. No JVD present. No thyromegaly present.  Cardiovascular: Regular rhythm, normal heart sounds and intact distal pulses.  Exam reveals no gallop and no friction rub.   No murmur heard. Pulmonary/Chest: Effort normal and breath sounds normal. He exhibits no tenderness.  Abdominal: Soft. Bowel sounds are normal. He exhibits no distension and no mass. There is no tenderness.  Genitourinary: Penis normal.  Musculoskeletal: Normal range of motion. He exhibits no edema and no tenderness.  Lymphadenopathy:    He has no cervical adenopathy.  Neurological: He is alert. He has normal reflexes. No cranial nerve deficit. Coordination normal.  Skin: Skin is warm and dry. No rash noted.  Psychiatric: He has a normal mood and affect. His behavior is normal.          Assessment & Plan:    Preventive health examination Hypertension well controlled Dyslipidemia well controlled. We'll continue simvastatin therapy  Recheck 6 months Urology followup as scheduled

## 2012-10-31 ENCOUNTER — Other Ambulatory Visit: Payer: Self-pay

## 2012-12-01 ENCOUNTER — Other Ambulatory Visit: Payer: Self-pay | Admitting: *Deleted

## 2012-12-01 MED ORDER — POTASSIUM CITRATE ER 10 MEQ (1080 MG) PO TBCR: 20.0000 meq | EXTENDED_RELEASE_TABLET | Freq: Two times a day (BID) | ORAL | Status: DC

## 2012-12-09 ENCOUNTER — Other Ambulatory Visit: Payer: Self-pay | Admitting: *Deleted

## 2012-12-10 MED ORDER — POTASSIUM CHLORIDE CRYS ER 20 MEQ PO TBCR: 20.0000 meq | EXTENDED_RELEASE_TABLET | Freq: Two times a day (BID) | ORAL | Status: DC

## 2013-04-21 ENCOUNTER — Other Ambulatory Visit: Payer: Self-pay

## 2013-05-28 ENCOUNTER — Telehealth: Payer: Self-pay | Admitting: Internal Medicine

## 2013-05-28 MED ORDER — CELECOXIB 200 MG PO CAPS: 200.0000 mg | ORAL_CAPSULE | Freq: Every morning | ORAL | Status: DC

## 2013-05-28 NOTE — Telephone Encounter (Signed)
Caller: Maki/Patient; Phone: [phone]; Reason for Call: Patient is wanting another new script for Celebrex.  He states it is too expensive with [facility].  He has found it cheaper through a [place] pharmacy.  He would like to pick up a new prescription. Please contact for assistance

## 2013-05-28 NOTE — Telephone Encounter (Signed)
Left message on voicemail to call office. Rx ready for pick up.

## 2013-07-22 ENCOUNTER — Other Ambulatory Visit: Payer: Self-pay

## 2013-08-09 NOTE — Progress Notes (Signed)
Need orders in EPIC.  Surgery scheduled for 08/30/13.  Thank You.  

## 2013-08-16 ENCOUNTER — Ambulatory Visit (INDEPENDENT_AMBULATORY_CARE_PROVIDER_SITE_OTHER): Payer: Medicare Other | Admitting: Internal Medicine

## 2013-08-16 ENCOUNTER — Encounter: Payer: Self-pay | Admitting: Internal Medicine

## 2013-08-16 VITALS — BP 156/80 | HR 76 | Temp 98.1°F | Resp 20 | Wt 208.0 lb

## 2013-08-16 DIAGNOSIS — E785 Hyperlipidemia, unspecified: Secondary | ICD-10-CM

## 2013-08-16 DIAGNOSIS — M1990 Unspecified osteoarthritis, unspecified site: Secondary | ICD-10-CM

## 2013-08-16 DIAGNOSIS — I10 Essential (primary) hypertension: Secondary | ICD-10-CM

## 2013-08-16 NOTE — Patient Instructions (Signed)
Limit your sodium (Salt) intake  Return in 6 months for follow-up  

## 2013-08-16 NOTE — Progress Notes (Signed)
Surgery scheduled for 08/30/13.  Preop on 08/23/13 at 0930.  Need orders in EPIC.  Thank You.

## 2013-08-16 NOTE — Progress Notes (Signed)
Pre-visit discussion using our clinic review tool. No additional management support is needed unless otherwise documented below in the visit note.  

## 2013-08-16 NOTE — Progress Notes (Signed)
Subjective:    Patient ID: [name] [name], male    DOB: 12-08-1935, 77 y.o.   MRN: [id number]  HPI Pre-visit discussion using our clinic review tool. No additional management support is needed unless otherwise documented below in the visit note.  77 year old patient who is seen today for a preoperative clearance evaluation. He is scheduled for right TKR. He has treated hypertension and dyslipidemia which have been well controlled. He does monitor home blood pressure readings which usually are in the low normal range. He denies any cardiopulmonary complaints. 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  He limits his walking due to bilateral knee pain. No exertional chest pain. EKG from last year reviewed and was unremarkable.  Past Medical History  Diagnosis Date  . HYPERLIPIDEMIA 04/23/2007  . 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 HYPERTENSION 06/02/2008  . URI 09/01/2008  . ALLERGIC RHINITIS 04/23/2007  . RECTAL BLEEDING 02/04/2008  . BENIGN PROSTATIC HYPERTROPHY 04/23/2007  . OSTEOARTHRITIS 04/23/2007  . Palpitations 02/04/2008  . CHEST PAIN 02/04/2008  . NAUSEA 12/29/2008  . NEPHROLITHIASIS, HX OF 04/23/2007    pt denies this   . Cellulitis june 2013    left side  . Kidney stone   . PONV (postoperative nausea and vomiting)   . H/O blurred vision     both eyes, occasional  . Neuropathy of left foot     History   Social History  . Marital Status: Married    Spouse Name: N/A    Number of Children: N/A  . Years of Education: N/A   Occupational History  . Not on file.   Social History Main Topics  . Smoking status: Never Smoker   . Smokeless tobacco: Never Used  . Alcohol Use: No  . Drug Use: No  . Sexual Activity: Not on file   Other Topics Concern  . Not on file   Social History Narrative  . No narrative on file    Past Surgical History  Procedure Laterality Date  . Appendectomy    . Hernia  repair    . Lithotripsy  1992    cystoscopy with stent placement also done  . Right shoulder replacement  Sep 28, 2009  . Left foot surgery for fx  Jul 30, 2011  . Transurethral resection of prostate  03/27/2012    Procedure: TRANSURETHRAL RESECTION OF THE PROSTATE WITH GYRUS INSTRUMENTS;  Surgeon: Garnett Farm, MD;  Location: [facility];  Service: Urology;;    Family History  Problem Relation Age of Onset  . Heart disease Mother 54    MI  . Stroke Father 28  . COPD Sister   . Diabetes Sister   . Diabetes Brother     No Known Allergies  Current Outpatient Prescriptions on File Prior to Visit  Medication Sig Dispense Refill  . b complex vitamins tablet Take 1 tablet by mouth daily.       . benazepril-hydrochlorthiazide (LOTENSIN HCT) 20-12.5 MG per tablet 1 tablet daily  90 tablet  6  . celecoxib (CELEBREX) 200 MG capsule Take 1 capsule (200 mg total) by mouth every morning.  90 capsule  3  . Coenzyme Q10 (CO Q-10) 100 MG CAPS Take 100 mg by mouth daily.       . fluticasone (FLONASE) 50 MCG/ACT nasal spray Place 1 spray into the nose daily as needed. Allergies  32 g  3  . gabapentin (NEURONTIN) 600 MG tablet Take 1 tablet (600 mg total) by mouth 3 (three) times daily.  270 tablet  3  . Glucosamine-Chondroit-Vit C-Mn (GLUCOSAMINE 1500 COMPLEX PO) Take 1 tablet by mouth 2 (two) times daily.       . hydroxypropyl methylcellulose (ISOPTO TEARS) 2.5 % ophthalmic solution Place 1 drop into both eyes 3 (three) times daily as needed. Dry eyes      . Krill Oil 500 MG CAPS Take 500 mg by mouth daily.       Marland Kitchen loratadine (CLARITIN) 10 MG tablet Take 10 mg by mouth every evening.       . Multiple Vitamin (MULTIVITAMIN) tablet Take 1 tablet by mouth daily.        . simvastatin (ZOCOR) 40 MG tablet Take 1 tablet (40 mg total) by mouth at bedtime.  90 tablet  6   No current facility-administered medications on file prior to visit.    BP 156/80  Pulse 76  Temp(Src) 98.1 F (36.7 C) (Oral)  Resp  20  Wt 208 lb (94.348 kg)  SpO2 98%       Review of Systems  Constitutional: Negative for fever, chills, appetite change and fatigue.  HENT: Negative for congestion, dental problem, ear pain, hearing loss, sore throat, tinnitus, trouble swallowing and voice change.   Eyes: Negative for pain, discharge and visual disturbance.  Respiratory: Negative for cough, chest tightness, wheezing and stridor.   Cardiovascular: Negative for chest pain, palpitations and leg swelling.  Gastrointestinal: Negative for nausea, vomiting, abdominal pain, diarrhea, constipation, blood in stool and abdominal distention.  Genitourinary: Negative for urgency, hematuria, flank pain, discharge, difficulty urinating and genital sores.  Musculoskeletal: Positive for arthralgias and gait problem. Negative for back pain, joint swelling, myalgias and neck stiffness.  Skin: Negative for rash.  Neurological: Negative for dizziness, syncope, speech difficulty, weakness, numbness and headaches.  Hematological: Negative for adenopathy. Does not bruise/bleed easily.  Psychiatric/Behavioral: Negative for behavioral problems and dysphoric mood. The patient is not nervous/anxious.        Objective:   Physical Exam  Constitutional: He is oriented to person, place, and time. He appears well-developed.  Repeat blood pressure 132/74  HENT:  Head: Normocephalic.  Right Ear: External ear normal.  Left Ear: External ear normal.  Eyes: Conjunctivae and EOM are normal.  Neck: Normal range of motion.  Cardiovascular: Normal rate.   Murmur heard. Grade 1/6 systolic murmur at the base  Pulmonary/Chest: Breath sounds normal.  Abdominal: Bowel sounds are normal.  Musculoskeletal: Normal range of motion. He exhibits no edema and no tenderness.  Neurological: He is alert and oriented to person, place, and time.  Psychiatric: He has a normal mood and affect. His behavior is normal.          Assessment & Plan:    Hypertension well controlled Dyslipidemia Osteoarthritis with end-stage right knee  No contraindications to the anticipated surgery

## 2013-08-17 ENCOUNTER — Other Ambulatory Visit: Payer: Self-pay | Admitting: Orthopedic Surgery

## 2013-08-17 NOTE — Progress Notes (Signed)
Preoperative surgical orders have been place into the Epic hospital system for VYOM BRASS on 08/17/2013, 9:42 AM  by Patrica Duel for surgery on 08-30-2013.  Preop Total Knee orders including Experal, PO Tylenol, and IV Decadron as long as there are no contraindications to the above medications. Avel Peace, PA-C

## 2013-08-20 ENCOUNTER — Encounter (HOSPITAL_COMMUNITY): Payer: Self-pay | Admitting: Pharmacy Technician

## 2013-08-23 ENCOUNTER — Encounter (HOSPITAL_COMMUNITY)
Admission: RE | Admit: 2013-08-23 | Discharge: 2013-08-23 | Disposition: A | Discharge status: Home / Self Care | Payer: Medicare Other | Source: Ambulatory Visit | Attending: Orthopedic Surgery | Admitting: Orthopedic Surgery

## 2013-08-23 ENCOUNTER — Ambulatory Visit (HOSPITAL_COMMUNITY)
Admission: RE | Admit: 2013-08-23 | Discharge: 2013-08-23 | Disposition: A | Discharge status: Home / Self Care | Payer: Medicare Other | Source: Ambulatory Visit | Attending: Orthopedic Surgery | Admitting: Orthopedic Surgery

## 2013-08-23 ENCOUNTER — Encounter (HOSPITAL_COMMUNITY): Payer: Self-pay

## 2013-08-23 DIAGNOSIS — Z01810 Encounter for preprocedural cardiovascular examination: Secondary | ICD-10-CM | POA: Insufficient documentation

## 2013-08-23 DIAGNOSIS — Z01818 Encounter for other preprocedural examination: Secondary | ICD-10-CM | POA: Insufficient documentation

## 2013-08-23 DIAGNOSIS — Z01812 Encounter for preprocedural laboratory examination: Secondary | ICD-10-CM | POA: Insufficient documentation

## 2013-08-23 DIAGNOSIS — M1710 Unilateral primary osteoarthritis, unspecified knee: Secondary | ICD-10-CM | POA: Insufficient documentation

## 2013-08-23 DIAGNOSIS — Z96619 Presence of unspecified artificial shoulder joint: Secondary | ICD-10-CM | POA: Insufficient documentation

## 2013-08-23 LAB — COMPREHENSIVE METABOLIC PANEL
Alkaline Phosphatase: 68 U/L (ref 39–117)
BUN: 29 mg/dL — ABNORMAL HIGH (ref 6–23)
Chloride: 103 mEq/L (ref 96–112)
GFR calc Af Amer: >90 mL/min (ref >90)
Glucose, Bld: 94 mg/dL (ref 70–99)
Potassium: 4.2 mEq/L (ref 3.5–5.1)
Total Bilirubin: 0.8 mg/dL (ref 0.3–1.2)

## 2013-08-23 LAB — APTT: aPTT: 26 seconds (ref 24–37)

## 2013-08-23 LAB — CBC
HCT: 41.2 % (ref 39.0–52.0)
Hemoglobin: 14.6 g/dL (ref 13.0–17.0)
MCH: 31.3 pg (ref 26.0–34.0)
RDW: 13.5 % (ref 11.5–15.5)
WBC: 9 10*3/uL (ref 4.0–10.5)

## 2013-08-23 LAB — PROTIME-INR: Prothrombin Time: 12.6 seconds (ref 11.6–15.2)

## 2013-08-23 LAB — URINALYSIS, ROUTINE W REFLEX MICROSCOPIC
Bilirubin Urine: NEGATIVE
Glucose, UA: NEGATIVE mg/dL
Hgb urine dipstick: NEGATIVE
Leukocytes, UA: NEGATIVE
Protein, ur: NEGATIVE mg/dL
Urobilinogen, UA: 0.2 mg/dL (ref 0.0–1.0)
pH: 5.5 (ref 5.0–8.0)

## 2013-08-23 NOTE — Pre-Procedure Instructions (Addendum)
08-23-13 EKG/ CXR done today 08-24-13 1545 CMP results faxed by Nathen May to Dr. Deri Fuelling office. Labs viewable in [place].

## 2013-08-23 NOTE — Patient Instructions (Addendum)
20 [name] [name]  08/23/2013   Your procedure is scheduled on:  12-15 -2014  Report to [facility] at     0630   AM   Call this number if you have problems the morning of surgery: [phone]  Or Presurgical Testing [phone](Quintus Premo)      Do not eat food:After Midnight.   Take these medicines the morning of surgery with A SIP OF WATER:  Gabapentin. Urocit.    Do not wear jewelry, make-up or nail polish.  Do not wear lotions, powders, or perfumes. You may wear deodorant.  Do not shave 12 hours prior to first CHG shower(legs and under arms).(face and neck okay.)  Do not bring valuables to the hospital.  Contacts, dentures or removable bridgework, body piercing, hair pins may not be worn into surgery.  Leave suitcase in the car. After surgery it may be brought to your room.  For patients admitted to the hospital, checkout time is 11:00 AM the day of discharge.   Patients discharged the day of surgery will not be allowed to drive home. Must have responsible person with you x 24 hours once discharged.  Name and phone number of your driver: paulette -spouse [phone] cell  Special Instructions: CHG(Chlorhedine 4%-"Hibiclens","Betasept","Aplicare") Shower Use Special Wash: see special instructions.(avoid face and genitals)   Please read over the following fact sheets that you were given: MRSA Information, Blood Transfusion fact sheet, Incentive Spirometry Instruction.  Remember : Type/Screen "Blue armbands" - may not be removed once applied(would result in being retested if removed).  Failure to follow these instructions may result in Cancellation of your surgery.   Patient signature_______________________________________________________

## 2013-08-24 ENCOUNTER — Encounter: Payer: Medicare Other | Admitting: Internal Medicine

## 2013-08-24 NOTE — Pre-Procedure Instructions (Signed)
PT'S PREOP CMET REPORT FAXED TO DR. ALUISIO'S OFFICE - BUN ELEVATED.

## 2013-08-29 ENCOUNTER — Other Ambulatory Visit: Payer: Self-pay | Admitting: Orthopedic Surgery

## 2013-08-29 NOTE — H&P (Signed)
[name] [name]  DOB: 07/20/1936 Married / Language: English / Race: White Male  Date of Admission:  08-30-2013  Chief Complaint:  Right Knee Pain  History of Present Illness The patient is a 77 year old male who comes in for a preoperative History and Physical. The patient is scheduled for a right total knee arthroplasty to be performed by Dr. Frank V. Aluisio, MD on 08-30-2013. The patient is seen for bilateral knee pain for years. The patient reports left knee and right knee symptoms including: pain, swelling, instability, giving way, stiffness and soreness which began year(s) ago without any known injury. The patient describes the severity of the symptoms as severe.The patient feels that the symptoms are worsening. Past treatment for this problem has included intra-articular injection of corticosteroids and nonsteroidal anti-inflammatory drugs (celebrex). Onset of symptoms was with symptoms now occurring constantly. [name] [name] comes in today for evaluation of both knees, right greater than left, which has been ongoing for many years now. He denies any specific injury or trauma to the knees but states they have been getting progressively worse with time. They both have gotten worse recently. They both pop and will buckle on him first thing in the morning. He will have sedintary stiffness any time that he is sitting or resting. He denies locking. Going up an down stpes are becoming more difficult. It hurts to squatt and has some shooting pain down the leg at time. He has been told that he has arthritis and probably would need surgery at some point. He has had cortisone injections in the past which have help but his last one a year ago did not help as much. Both knees are bothering him but the right is far worse than the left. It hurts at all times. It limits what he can and cannot do. He also get a lot of swelling especially posteriorly. He has had injections in the past of  cortisone which helped for a short amount of time. Each success of injection is a little bit less. He is at a stage now where he feels like he needs to do something further in order to get these knees more functional. As stated, the right one is the main one bothering him now. He is ready to proceed with surgery on the right knee. They have been treated conservatively in the past for the above stated problem and despite conservative measures, they continue to have progressive pain and severe functional limitations and dysfunction. They have failed non-operative management including home exercise, medications, and injections. It is felt that they would benefit from undergoing total joint replacement. Risks and benefits of the procedure have been discussed with the patient and they elect to proceed with surgery. There are no active contraindications to surgery such as ongoing infection or rapidly progressive neurological disease.   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 Left Ear Hemorrhoids Diverticulosis Degenerative Disc Disease Measles Mumps   Allergies No Known Drug Allergies. 05/27/2011    Family History Heart Disease. First Degree Relatives. mother, father and sister Chronic Obstructive Lung Disease. First Degree Relatives. sister Severe allergy. sister Other medical problems. grandmother (father) alzhemers; father, arthritis, Cancer. father and grandmother mothers side Congestive Heart Failure. Father. father Hypertension. mother, father and brother Heart disease in male family member before   age 65 Diabetes Mellitus. mother and  brother    Social History Pain Contract. no Number of flights of stairs before winded. 2-3 Marital status. married Tobacco use. Never smoker. never smoker No alcohol use Previously in rehab. no Living situation. live with spouse Current work status. retired Children. 3 Alcohol use. never consumed alcohol Illicit drug use. no Exercise. Exercises weekly; does individual sport Drug/Alcohol Rehab (Currently). no Post-Surgical Plans. Home Advance Directives. Living Will, Healthcare POA   Past Surgical History Other Surgery. Date: 1992. lithotripsy Inguinal Hernia Repair. Date: 1999. open: bilateral Foot Surgery. Date: 07/2011. left - LisFranc Fracture Appendectomy. Date: 1955. Arthroscopic Shoulder Surgery - Right Total Shoulder Replacement - Right. Date: 09/2009. TURP. Date: 2013.   Review of Systems General:Not Present- Chills, Fever, Night Sweats, Fatigue, Weight Gain, Weight Loss and Memory Loss. Skin:Not Present- Hives, Itching, Rash, Eczema and Lesions. HEENT:Present- Blurred Vision and Hearing Loss (Left Ear). Not Present- Tinnitus, Headache, Double Vision, Visual Loss and Dentures. Respiratory:Present- Chronic Cough. Not Present- Shortness of breath with exertion, Shortness of breath at rest, Allergies and Coughing up blood. Cardiovascular:Not Present- Chest Pain, Racing/skipping heartbeats, Difficulty Breathing Lying Down, Murmur, Swelling and Palpitations. Gastrointestinal:Present- Constipation (Takes Meatmucil daily). Not Present- Bloody Stool, Heartburn, Abdominal Pain, Vomiting, Nausea, Diarrhea, Difficulty Swallowing, Jaundice and Loss of appetitie. Male Genitourinary:Present- Urinary frequency and Urinating at Night. Not Present- Blood in Urine, Weak urinary stream, Discharge, Flank Pain, Incontinence, Painful Urination, Urgency and Urinary Retention. Musculoskeletal:Present- Joint Swelling, Joint Pain and Morning Stiffness. Not  Present- Muscle Weakness, Muscle Pain, Back Pain and Spasms. Neurological:Not Present- Tremor, Dizziness, Blackout spells, Paralysis, Difficulty with balance and Weakness. Psychiatric:Not Present- Insomnia.    Vitals Weight: 200 lb Height: 70 in Weight was reported by patient. Height was reported by patient. Body Surface Area: 2.12 m Body Mass Index: 28.7 kg/m Pulse: 64 (Regular) Resp.: 12 (Unlabored) BP: 140/76 (Sitting, Right Arm, Standard)     Physical Exam The physical exam findings are as follows:  Note: Patient is a 76 year old male with bilateral knee pain, right greater than left knee. Patient is accompanied today by his wife.   General Mental Status - Alert, cooperative and good historian. General Appearance- pleasant. Not in acute distress. Orientation- Oriented X3. Build & Nutrition- Well nourished and Well developed.   Head and Neck Head- normocephalic, atraumatic . Neck Global Assessment- supple. no bruit auscultated on the right and no bruit auscultated on the left.   Eye Vision- Wears corrective lenses (part of the time). Pupil- Bilateral- Regular and Round. Motion- Bilateral- EOMI.   Chest and Lung Exam Auscultation: Breath sounds:- clear at anterior chest wall and - clear at posterior chest wall. Adventitious sounds:- No Adventitious sounds.   Cardiovascular Auscultation:Rhythm- Regular rate and rhythm. Heart Sounds- S1 WNL and S2 WNL. Murmurs & Other Heart Sounds: Murmur 1:Location- Aortic Area. Timing- Early systolic. Grade- II/VI. Character- Low pitched.   Abdomen Palpation/Percussion:Tenderness- Abdomen is non-tender to palpation. Rigidity (guarding)- Abdomen is soft. Auscultation:Auscultation of the abdomen reveals - Bowel sounds normal.   Male Genitourinary Not done, not pertinent to present illness  Musculoskeletal On exam, well developed male alert and oriented in no apparent  distress. Evaluation of his hip shows normal range of motion with no discomfort. Left knee no effusion. Small Baker's cyst on the left. His range is about 0-125 with moderate crepitus on range of motion. Tenderness medial greater than lateral with no instability noted. Right knee no effusion with a large Baker's cyst. Slight varus deformity. Range   5-120. Marked crepitus on range of motion. Tenderness medial greater than lateral with no instability noted. Pulse, sensation, and motor intact both lower extremities.  RADIOGRAPHS: AP both knees and lateral show advanced end stage arthritis of the right knee bone on bone medial and patellofemoral with varus deformity. Left knee is also bone on bone but not as severe as the right.   Assessment & Plan Primary osteoarthritis of both knees (715.16) Impression: Right Knee  Note: Plan is for a Right Total Knee Replacement by Dr. Aluisio.  Plan is to go Home  PCP - Dr. Kwiatkowsi - Patient has been seen preoperatively and felt to be stable for surgery.  The patient does not have any contraindications and will receive TXA (tranexamic acid) prior to surgery.  Please note, the patient does get nauseated very easily with anesthesia.  Signed electronically by Alexzandrew L Perkins, III PA-C 

## 2013-08-30 ENCOUNTER — Inpatient Hospital Stay (HOSPITAL_COMMUNITY)
Admission: RE | Admit: 2013-08-30 | Discharge: 2013-09-01 | DRG: 470 | Disposition: A | Discharge status: Home Health | Payer: Medicare Other | Source: Ambulatory Visit | Attending: Orthopedic Surgery | Admitting: Orthopedic Surgery

## 2013-08-30 ENCOUNTER — Encounter (HOSPITAL_COMMUNITY)
Admission: RE | Disposition: A | Discharge status: Home Health | Payer: Self-pay | Source: Ambulatory Visit | Attending: Orthopedic Surgery

## 2013-08-30 ENCOUNTER — Encounter (HOSPITAL_COMMUNITY): Payer: Self-pay | Admitting: Anesthesiology

## 2013-08-30 ENCOUNTER — Inpatient Hospital Stay (HOSPITAL_COMMUNITY): Payer: Medicare Other | Admitting: Anesthesiology

## 2013-08-30 ENCOUNTER — Encounter (HOSPITAL_COMMUNITY): Payer: Medicare Other | Admitting: Anesthesiology

## 2013-08-30 DIAGNOSIS — Z96651 Presence of right artificial knee joint: Secondary | ICD-10-CM

## 2013-08-30 DIAGNOSIS — Z87442 Personal history of urinary calculi: Secondary | ICD-10-CM

## 2013-08-30 DIAGNOSIS — H9190 Unspecified hearing loss, unspecified ear: Secondary | ICD-10-CM | POA: Diagnosis present

## 2013-08-30 DIAGNOSIS — M179 Osteoarthritis of knee, unspecified: Secondary | ICD-10-CM | POA: Diagnosis present

## 2013-08-30 DIAGNOSIS — M359 Systemic involvement of connective tissue, unspecified: Secondary | ICD-10-CM | POA: Diagnosis present

## 2013-08-30 DIAGNOSIS — I10 Essential (primary) hypertension: Secondary | ICD-10-CM | POA: Diagnosis present

## 2013-08-30 DIAGNOSIS — M1710 Unilateral primary osteoarthritis, unspecified knee: Principal | ICD-10-CM | POA: Diagnosis present

## 2013-08-30 DIAGNOSIS — E7800 Pure hypercholesterolemia, unspecified: Secondary | ICD-10-CM | POA: Diagnosis present

## 2013-08-30 DIAGNOSIS — Z96619 Presence of unspecified artificial shoulder joint: Secondary | ICD-10-CM

## 2013-08-30 DIAGNOSIS — G609 Hereditary and idiopathic neuropathy, unspecified: Secondary | ICD-10-CM | POA: Diagnosis present

## 2013-08-30 DIAGNOSIS — IMO0002 Reserved for concepts with insufficient information to code with codable children: Secondary | ICD-10-CM | POA: Diagnosis present

## 2013-08-30 DIAGNOSIS — N400 Enlarged prostate without lower urinary tract symptoms: Secondary | ICD-10-CM | POA: Diagnosis present

## 2013-08-30 DIAGNOSIS — E785 Hyperlipidemia, unspecified: Secondary | ICD-10-CM | POA: Diagnosis present

## 2013-08-30 HISTORY — PX: TOTAL KNEE ARTHROPLASTY: SHX125

## 2013-08-30 LAB — TYPE AND SCREEN: Antibody Screen: NEGATIVE

## 2013-08-30 SURGERY — ARTHROPLASTY, KNEE, TOTAL
Anesthesia: Spinal | Site: Knee | Laterality: Right

## 2013-08-30 MED ORDER — HYDROMORPHONE HCL PF 1 MG/ML IJ SOLN: 0.2500 mg | INTRAMUSCULAR | Status: DC | PRN

## 2013-08-30 MED ORDER — RIVAROXABAN 10 MG PO TABS
10.0000 mg | ORAL_TABLET | Freq: Every day | ORAL | Status: DC
  Administered 2013-08-31 – 2013-09-01 (×2): 10 mg via ORAL
  Filled 2013-08-30 (×3): qty 1

## 2013-08-30 MED ORDER — BUPIVACAINE LIPOSOME 1.3 % IJ SUSP
INTRAMUSCULAR | Status: DC | PRN
  Administered 2013-08-30: 20 mL

## 2013-08-30 MED ORDER — KCL IN DEXTROSE-NACL 20-5-0.9 MEQ/L-%-% IV SOLN
INTRAVENOUS | Status: DC
Start: 2013-08-30 — End: 2013-09-01
  Administered 2013-08-30 – 2013-08-31 (×3): via INTRAVENOUS
  Filled 2013-08-30 (×3): qty 1000

## 2013-08-30 MED ORDER — DEXAMETHASONE SODIUM PHOSPHATE 10 MG/ML IJ SOLN
10.0000 mg | Freq: Once | INTRAMUSCULAR | Status: AC
  Administered 2013-08-30: 10 mg via INTRAVENOUS

## 2013-08-30 MED ORDER — FLEET ENEMA 7-19 GM/118ML RE ENEM: 1.0000 | ENEMA | Freq: Once | RECTAL | Status: AC | PRN

## 2013-08-30 MED ORDER — ONDANSETRON HCL 4 MG/2ML IJ SOLN
INTRAMUSCULAR | Status: DC | PRN
  Administered 2013-08-30: 4 mg via INTRAVENOUS

## 2013-08-30 MED ORDER — METHOCARBAMOL 500 MG PO TABS
500.0000 mg | ORAL_TABLET | Freq: Four times a day (QID) | ORAL | Status: DC | PRN
  Administered 2013-08-30 – 2013-08-31 (×3): 500 mg via ORAL
  Filled 2013-08-30 (×3): qty 1

## 2013-08-30 MED ORDER — ACETAMINOPHEN 500 MG PO TABS
1000.0000 mg | ORAL_TABLET | Freq: Once | ORAL | Status: AC
  Administered 2013-08-30: 1000 mg via ORAL
  Filled 2013-08-30: qty 2

## 2013-08-30 MED ORDER — POLYETHYLENE GLYCOL 3350 17 G PO PACK: 17.0000 g | PACK | Freq: Every day | ORAL | Status: DC | PRN

## 2013-08-30 MED ORDER — ACETAMINOPHEN 500 MG PO TABS
1000.0000 mg | ORAL_TABLET | Freq: Four times a day (QID) | ORAL | Status: AC
  Administered 2013-08-30 – 2013-08-31 (×4): 1000 mg via ORAL
  Filled 2013-08-30 (×4): qty 2

## 2013-08-30 MED ORDER — DEXAMETHASONE 6 MG PO TABS
10.0000 mg | ORAL_TABLET | Freq: Every day | ORAL | Status: AC
  Administered 2013-08-31: 10 mg via ORAL
  Filled 2013-08-30: qty 1

## 2013-08-30 MED ORDER — ACETAMINOPHEN 325 MG PO TABS
650.0000 mg | ORAL_TABLET | Freq: Four times a day (QID) | ORAL | Status: DC | PRN
  Administered 2013-08-31 – 2013-09-01 (×2): 650 mg via ORAL
  Filled 2013-08-30 (×2): qty 2

## 2013-08-30 MED ORDER — FENTANYL CITRATE 0.05 MG/ML IJ SOLN
INTRAMUSCULAR | Status: AC
  Filled 2013-08-30: qty 2

## 2013-08-30 MED ORDER — SIMVASTATIN 40 MG PO TABS
40.0000 mg | ORAL_TABLET | Freq: Every morning | ORAL | Status: DC
  Administered 2013-08-30 – 2013-09-01 (×3): 40 mg via ORAL
  Filled 2013-08-30 (×3): qty 1

## 2013-08-30 MED ORDER — PROPOFOL INFUSION 10 MG/ML OPTIME
INTRAVENOUS | Status: DC | PRN
  Administered 2013-08-30: 100 ug/kg/min via INTRAVENOUS

## 2013-08-30 MED ORDER — SODIUM CHLORIDE 0.9 % IJ SOLN
INTRAMUSCULAR | Status: AC
  Filled 2013-08-30: qty 50

## 2013-08-30 MED ORDER — TRANEXAMIC ACID 100 MG/ML IV SOLN
1000.0000 mg | INTRAVENOUS | Status: AC
  Administered 2013-08-30: 1000 mg via INTRAVENOUS
  Filled 2013-08-30: qty 10

## 2013-08-30 MED ORDER — SODIUM CHLORIDE 0.9 % IV SOLN: INTRAVENOUS | Status: DC

## 2013-08-30 MED ORDER — DEXAMETHASONE SODIUM PHOSPHATE 10 MG/ML IJ SOLN
INTRAMUSCULAR | Status: AC
  Filled 2013-08-30: qty 1

## 2013-08-30 MED ORDER — SODIUM CHLORIDE 0.9 % IJ SOLN
INTRAMUSCULAR | Status: AC
  Filled 2013-08-30: qty 10

## 2013-08-30 MED ORDER — KETAMINE HCL 10 MG/ML IJ SOLN
INTRAMUSCULAR | Status: DC | PRN
  Administered 2013-08-30 (×5): 10 mg via INTRAVENOUS

## 2013-08-30 MED ORDER — 0.9 % SODIUM CHLORIDE (POUR BTL) OPTIME
TOPICAL | Status: DC | PRN
  Administered 2013-08-30: 1000 mL

## 2013-08-30 MED ORDER — STERILE WATER FOR IRRIGATION IR SOLN
Status: DC | PRN
  Administered 2013-08-30: 1500 mL

## 2013-08-30 MED ORDER — LIDOCAINE HCL (CARDIAC) 20 MG/ML IV SOLN
INTRAVENOUS | Status: AC
  Filled 2013-08-30: qty 5

## 2013-08-30 MED ORDER — KETOROLAC TROMETHAMINE 15 MG/ML IJ SOLN: 7.5000 mg | Freq: Four times a day (QID) | INTRAMUSCULAR | Status: AC | PRN

## 2013-08-30 MED ORDER — ONDANSETRON HCL 4 MG PO TABS: 4.0000 mg | ORAL_TABLET | Freq: Four times a day (QID) | ORAL | Status: DC | PRN

## 2013-08-30 MED ORDER — CHLORHEXIDINE GLUCONATE 4 % EX LIQD: 60.0000 mL | Freq: Once | CUTANEOUS | Status: DC

## 2013-08-30 MED ORDER — FLUTICASONE PROPIONATE 50 MCG/ACT NA SUSP
1.0000 | Freq: Every day | NASAL | Status: DC
  Administered 2013-08-30 – 2013-09-01 (×3): 1 via NASAL
  Filled 2013-08-30: qty 16

## 2013-08-30 MED ORDER — DEXAMETHASONE SODIUM PHOSPHATE 10 MG/ML IJ SOLN
10.0000 mg | Freq: Every day | INTRAMUSCULAR | Status: AC
  Filled 2013-08-30: qty 1

## 2013-08-30 MED ORDER — CEFAZOLIN SODIUM-DEXTROSE 2-3 GM-% IV SOLR
2.0000 g | INTRAVENOUS | Status: AC
  Administered 2013-08-30: 2 g via INTRAVENOUS

## 2013-08-30 MED ORDER — OXYCODONE HCL 5 MG PO TABS
5.0000 mg | ORAL_TABLET | ORAL | Status: DC | PRN
  Administered 2013-08-30 – 2013-08-31 (×8): 10 mg via ORAL
  Filled 2013-08-30 (×8): qty 2

## 2013-08-30 MED ORDER — ONDANSETRON HCL 4 MG/2ML IJ SOLN
4.0000 mg | Freq: Four times a day (QID) | INTRAMUSCULAR | Status: DC | PRN
  Administered 2013-08-31: 4 mg via INTRAVENOUS
  Filled 2013-08-30: qty 2

## 2013-08-30 MED ORDER — MIDAZOLAM HCL 5 MG/5ML IJ SOLN
INTRAMUSCULAR | Status: DC | PRN
  Administered 2013-08-30: 2 mg via INTRAVENOUS

## 2013-08-30 MED ORDER — TRAMADOL HCL 50 MG PO TABS: 50.0000 mg | ORAL_TABLET | Freq: Four times a day (QID) | ORAL | Status: DC | PRN

## 2013-08-30 MED ORDER — DIPHENHYDRAMINE HCL 12.5 MG/5ML PO ELIX: 12.5000 mg | ORAL_SOLUTION | ORAL | Status: DC | PRN

## 2013-08-30 MED ORDER — DOCUSATE SODIUM 100 MG PO CAPS
100.0000 mg | ORAL_CAPSULE | Freq: Two times a day (BID) | ORAL | Status: DC
  Administered 2013-08-30 – 2013-09-01 (×4): 100 mg via ORAL

## 2013-08-30 MED ORDER — BUPIVACAINE IN DEXTROSE 0.75-8.25 % IT SOLN
INTRATHECAL | Status: DC | PRN
  Administered 2013-08-30: 2 mL via INTRATHECAL

## 2013-08-30 MED ORDER — ONDANSETRON HCL 4 MG/2ML IJ SOLN
INTRAMUSCULAR | Status: AC
  Filled 2013-08-30: qty 2

## 2013-08-30 MED ORDER — PROPOFOL 10 MG/ML IV BOLUS
INTRAVENOUS | Status: AC
  Filled 2013-08-30: qty 20

## 2013-08-30 MED ORDER — DEXTROSE 5 % IV SOLN
500.0000 mg | Freq: Four times a day (QID) | INTRAVENOUS | Status: DC | PRN
  Filled 2013-08-30: qty 5

## 2013-08-30 MED ORDER — SODIUM CHLORIDE 0.9 % IJ SOLN
INTRAMUSCULAR | Status: DC | PRN
  Administered 2013-08-30: 30 mL

## 2013-08-30 MED ORDER — BUPIVACAINE-EPINEPHRINE PF 0.25-1:200000 % IJ SOLN
INTRAMUSCULAR | Status: AC
  Filled 2013-08-30: qty 30

## 2013-08-30 MED ORDER — LORATADINE 10 MG PO TABS
10.0000 mg | ORAL_TABLET | Freq: Every evening | ORAL | Status: DC
  Administered 2013-08-30 – 2013-08-31 (×2): 10 mg via ORAL
  Filled 2013-08-30 (×3): qty 1

## 2013-08-30 MED ORDER — EPHEDRINE SULFATE 50 MG/ML IJ SOLN
INTRAMUSCULAR | Status: AC
  Filled 2013-08-30: qty 1

## 2013-08-30 MED ORDER — POTASSIUM CITRATE ER 10 MEQ (1080 MG) PO TBCR
20.0000 meq | EXTENDED_RELEASE_TABLET | Freq: Two times a day (BID) | ORAL | Status: DC
  Administered 2013-08-30 – 2013-09-01 (×4): 20 meq via ORAL
  Filled 2013-08-30 (×6): qty 2

## 2013-08-30 MED ORDER — GABAPENTIN 300 MG PO CAPS
600.0000 mg | ORAL_CAPSULE | Freq: Three times a day (TID) | ORAL | Status: DC
  Administered 2013-08-30 – 2013-09-01 (×6): 600 mg via ORAL
  Filled 2013-08-30 (×8): qty 2

## 2013-08-30 MED ORDER — FENTANYL CITRATE 0.05 MG/ML IJ SOLN: 25.0000 ug | INTRAMUSCULAR | Status: DC | PRN

## 2013-08-30 MED ORDER — SODIUM CHLORIDE 0.9 % IR SOLN
Status: DC | PRN
  Administered 2013-08-30: 1000 mL

## 2013-08-30 MED ORDER — EPHEDRINE SULFATE 50 MG/ML IJ SOLN
INTRAMUSCULAR | Status: DC | PRN
  Administered 2013-08-30 (×3): 5 mg via INTRAVENOUS

## 2013-08-30 MED ORDER — ACETAMINOPHEN 650 MG RE SUPP: 650.0000 mg | Freq: Four times a day (QID) | RECTAL | Status: DC | PRN

## 2013-08-30 MED ORDER — PROMETHAZINE HCL 25 MG/ML IJ SOLN: 6.2500 mg | INTRAMUSCULAR | Status: DC | PRN

## 2013-08-30 MED ORDER — MORPHINE SULFATE 2 MG/ML IJ SOLN
1.0000 mg | INTRAMUSCULAR | Status: DC | PRN
  Administered 2013-08-30 (×3): 2 mg via INTRAVENOUS
  Filled 2013-08-30 (×3): qty 1

## 2013-08-30 MED ORDER — CEFAZOLIN SODIUM-DEXTROSE 2-3 GM-% IV SOLR
INTRAVENOUS | Status: AC
  Filled 2013-08-30: qty 50

## 2013-08-30 MED ORDER — MENTHOL 3 MG MT LOZG: 1.0000 | LOZENGE | OROMUCOSAL | Status: DC | PRN

## 2013-08-30 MED ORDER — KETAMINE HCL 50 MG/ML IJ SOLN
INTRAMUSCULAR | Status: AC
  Filled 2013-08-30: qty 10

## 2013-08-30 MED ORDER — METOCLOPRAMIDE HCL 5 MG/ML IJ SOLN: 5.0000 mg | Freq: Three times a day (TID) | INTRAMUSCULAR | Status: DC | PRN

## 2013-08-30 MED ORDER — CEFAZOLIN SODIUM-DEXTROSE 2-3 GM-% IV SOLR
2.0000 g | Freq: Four times a day (QID) | INTRAVENOUS | Status: AC
  Administered 2013-08-30 (×2): 2 g via INTRAVENOUS
  Filled 2013-08-30 (×2): qty 50

## 2013-08-30 MED ORDER — BUPIVACAINE LIPOSOME 1.3 % IJ SUSP
20.0000 mL | Freq: Once | INTRAMUSCULAR | Status: DC
  Filled 2013-08-30: qty 20

## 2013-08-30 MED ORDER — BISACODYL 10 MG RE SUPP: 10.0000 mg | Freq: Every day | RECTAL | Status: DC | PRN

## 2013-08-30 MED ORDER — PSYLLIUM 95 % PO PACK
1.0000 | PACK | Freq: Every day | ORAL | Status: DC
  Administered 2013-08-30 – 2013-09-01 (×2): 1 via ORAL
  Filled 2013-08-30 (×3): qty 1

## 2013-08-30 MED ORDER — PHENOL 1.4 % MT LIQD: 1.0000 | OROMUCOSAL | Status: DC | PRN

## 2013-08-30 MED ORDER — MIDAZOLAM HCL 2 MG/2ML IJ SOLN
INTRAMUSCULAR | Status: AC
  Filled 2013-08-30: qty 2

## 2013-08-30 MED ORDER — BUPIVACAINE HCL 0.25 % IJ SOLN
INTRAMUSCULAR | Status: DC | PRN
  Administered 2013-08-30: 20 mL

## 2013-08-30 MED ORDER — METOCLOPRAMIDE HCL 10 MG PO TABS: 5.0000 mg | ORAL_TABLET | Freq: Three times a day (TID) | ORAL | Status: DC | PRN

## 2013-08-30 MED ORDER — GABAPENTIN 600 MG PO TABS: 600.0000 mg | ORAL_TABLET | Freq: Three times a day (TID) | ORAL | Status: DC

## 2013-08-30 MED ORDER — FENTANYL CITRATE 0.05 MG/ML IJ SOLN
INTRAMUSCULAR | Status: DC | PRN
  Administered 2013-08-30: 100 ug via INTRAVENOUS

## 2013-08-30 MED ORDER — LACTATED RINGERS IV SOLN
INTRAVENOUS | Status: DC
  Administered 2013-08-30: 10:00:00 via INTRAVENOUS
  Administered 2013-08-30: 1000 mL via INTRAVENOUS

## 2013-08-30 SURGICAL SUPPLY — 56 items
BAG SPEC THK2 15X12 ZIP CLS (MISCELLANEOUS) ×2
BAG ZIPLOCK 12X15 (MISCELLANEOUS) ×3 IMPLANT
BANDAGE ELASTIC 6 VELCRO ST LF (GAUZE/BANDAGES/DRESSINGS) ×3 IMPLANT
BANDAGE ESMARK 6X9 LF (GAUZE/BANDAGES/DRESSINGS) ×2 IMPLANT
BLADE SAG 18X100X1.27 (BLADE) ×3 IMPLANT
BLADE SAW SGTL 11.0X1.19X90.0M (BLADE) ×3 IMPLANT
BNDG CMPR 9X6 STRL LF SNTH (GAUZE/BANDAGES/DRESSINGS) ×2
BNDG ESMARK 6X9 LF (GAUZE/BANDAGES/DRESSINGS) ×3
BOWL SMART MIX CTS (DISPOSABLE) ×3 IMPLANT
CAPT RP KNEE ×1 IMPLANT
CEMENT HV SMART SET (Cement) ×6 IMPLANT
CUFF TOURN SGL QUICK 34 (TOURNIQUET CUFF) ×3
CUFF TRNQT CYL 34X4X40X1 (TOURNIQUET CUFF) ×2 IMPLANT
DECANTER SPIKE VIAL GLASS SM (MISCELLANEOUS) ×3 IMPLANT
DRAPE EXTREMITY T 121X128X90 (DRAPE) ×3 IMPLANT
DRAPE POUCH INSTRU U-SHP 10X18 (DRAPES) ×3 IMPLANT
DRAPE U-SHAPE 47X51 STRL (DRAPES) ×3 IMPLANT
DRSG ADAPTIC 3X8 NADH LF (GAUZE/BANDAGES/DRESSINGS) ×3 IMPLANT
DURAPREP 26ML APPLICATOR (WOUND CARE) ×3 IMPLANT
ELECT REM PT RETURN 9FT ADLT (ELECTROSURGICAL) ×3
ELECTRODE REM PT RTRN 9FT ADLT (ELECTROSURGICAL) ×2 IMPLANT
EVACUATOR 1/8 PVC DRAIN (DRAIN) ×3 IMPLANT
FACESHIELD LNG OPTICON STERILE (SAFETY) ×15 IMPLANT
GLOVE BIO SURGEON STRL SZ7.5 (GLOVE) IMPLANT
GLOVE BIO SURGEON STRL SZ8 (GLOVE) ×3 IMPLANT
GLOVE BIOGEL PI IND STRL 8 (GLOVE) ×4 IMPLANT
GLOVE BIOGEL PI INDICATOR 8 (GLOVE) ×2
GLOVE SURG SS PI 6.5 STRL IVOR (GLOVE) IMPLANT
GOWN PREVENTION PLUS LG XLONG (DISPOSABLE) ×3 IMPLANT
GOWN STRL REIN XL XLG (GOWN DISPOSABLE) IMPLANT
HANDPIECE INTERPULSE COAX TIP (DISPOSABLE) ×3
IMMOBILIZER KNEE 20 (SOFTGOODS) ×3
IMMOBILIZER KNEE 20 THIGH 36 (SOFTGOODS) ×2 IMPLANT
KIT BASIN OR (CUSTOM PROCEDURE TRAY) ×3 IMPLANT
MANIFOLD NEPTUNE II (INSTRUMENTS) ×3 IMPLANT
NDL SAFETY ECLIPSE 18X1.5 (NEEDLE) ×4 IMPLANT
NEEDLE HYPO 18GX1.5 SHARP (NEEDLE) ×6
NS IRRIG 1000ML POUR BTL (IV SOLUTION) ×3 IMPLANT
PACK TOTAL JOINT (CUSTOM PROCEDURE TRAY) ×3 IMPLANT
PAD ABD 8X10 STRL (GAUZE/BANDAGES/DRESSINGS) ×3 IMPLANT
PADDING CAST COTTON 6X4 STRL (CAST SUPPLIES) ×8 IMPLANT
POSITIONER SURGICAL ARM (MISCELLANEOUS) ×3 IMPLANT
SET HNDPC FAN SPRY TIP SCT (DISPOSABLE) ×2 IMPLANT
SPONGE GAUZE 4X4 12PLY (GAUZE/BANDAGES/DRESSINGS) ×3 IMPLANT
STRIP CLOSURE SKIN 1/2X4 (GAUZE/BANDAGES/DRESSINGS) ×6 IMPLANT
SUCTION FRAZIER 12FR DISP (SUCTIONS) ×3 IMPLANT
SUT MNCRL AB 4-0 PS2 18 (SUTURE) ×3 IMPLANT
SUT VIC AB 2-0 CT1 27 (SUTURE) ×9
SUT VIC AB 2-0 CT1 TAPERPNT 27 (SUTURE) ×6 IMPLANT
SUT VLOC 180 0 24IN GS25 (SUTURE) ×3 IMPLANT
SYR 20CC LL (SYRINGE) ×3 IMPLANT
SYR 50ML LL SCALE MARK (SYRINGE) ×3 IMPLANT
TOWEL OR 17X26 10 PK STRL BLUE (TOWEL DISPOSABLE) ×6 IMPLANT
TRAY FOLEY CATH 14FRSI W/METER (CATHETERS) ×3 IMPLANT
WATER STERILE IRR 1500ML POUR (IV SOLUTION) ×3 IMPLANT
WRAP KNEE MAXI GEL POST OP (GAUZE/BANDAGES/DRESSINGS) ×3 IMPLANT

## 2013-08-30 NOTE — Progress Notes (Signed)
Utilization review completed.  

## 2013-08-30 NOTE — Progress Notes (Signed)
patient states he has had some sinus drainage, some chest wheezing w a small productive cough of ocassional colored sputum. Afebrile

## 2013-08-30 NOTE — Anesthesia Preprocedure Evaluation (Signed)
Anesthesia Evaluation  Patient identified by MRN, date of birth, ID band Patient awake    Reviewed: Allergy & Precautions, H&P , NPO status , Patient's Chart, lab work & pertinent test results  History of Anesthesia Complications (+) PONV and history of anesthetic complications  Airway Mallampati: II TM Distance: >3 FB Neck ROM: Full    Dental no notable dental hx.    Pulmonary neg pulmonary ROS,  breath sounds clear to auscultation  Pulmonary exam normal       Cardiovascular hypertension, Pt. on medications negative cardio ROS  Rhythm:Regular Rate:Normal     Neuro/Psych Neuropathy left foot.  Neuromuscular disease negative psych ROS   GI/Hepatic negative GI ROS, Neg liver ROS,   Endo/Other  negative endocrine ROS  Renal/GU Renal disease  negative genitourinary   Musculoskeletal negative musculoskeletal ROS (+)   Abdominal   Peds negative pediatric ROS (+)  Hematology negative hematology ROS (+)   Anesthesia Other Findings   Reproductive/Obstetrics negative OB ROS                           Anesthesia Physical Anesthesia Plan  ASA: II  Anesthesia Plan: Spinal   Post-op Pain Management:    Induction: Intravenous  Airway Management Planned:   Additional Equipment:   Intra-op Plan:   Post-operative Plan:   Informed Consent: I have reviewed the patients History and Physical, chart, labs and discussed the procedure including the risks, benefits and alternatives for the proposed anesthesia with the patient or authorized representative who has indicated his/her understanding and acceptance.   Dental advisory given  Plan Discussed with: CRNA  Anesthesia Plan Comments: (Discussed r/b general versus spinal. Discussed risks/benefits of spinal including headache, backache, failure, bleeding, infection, and nerve damage. Patient consents to spinal. Questions answered. Coagulation  studies and platelet count acceptable.)        Anesthesia Quick Evaluation

## 2013-08-30 NOTE — Evaluation (Signed)
Physical Therapy Evaluation Patient Details Name: [name] [name] MRN: [id number] DOB: 07-28-1936 Today's Date: 08/30/2013 Time: 8295-6213 PT Time Calculation (min): 33 min  PT Assessment / Plan / Recommendation History of Present Illness  s/p RTKA  Clinical Impression  Pt present s/p RTKA with decreased mobility, strength and ROM . To benefit from PT to increase ability with all of these and to discharge home safely with wife assisting.     PT Assessment  Patient needs continued PT services    Follow Up Recommendations  Home health PT    Does the patient have the potential to tolerate intense rehabilitation      Barriers to Discharge        Equipment Recommendations  Rolling walker with 5" wheels    Recommendations for Other Services     Frequency 7X/week    Precautions / Restrictions     Pertinent Vitals/Pain Pt stated his pain was very little, very tolerable.       Mobility  Bed Mobility Bed Mobility: Supine to Sit;Sit to Supine Supine to Sit: 4: Min assist Sit to Supine: Not Tested (comment) Details for Bed Mobility Assistance: cues for sequencing Transfers Transfers: Sit to Stand;Stand to Sit Sit to Stand: 4: Min assist;From elevated surface;With upper extremity assist Stand to Sit: 4: Min assist;With upper extremity assist Details for Transfer Assistance: cues for sequencing and proper use of RW Ambulation/Gait Ambulation/Gait Assistance: 4: Min assist Ambulation Distance (Feet): 15 Feet Assistive device: Rolling walker Ambulation/Gait Assistance Details: cues for sequencing Gait Pattern: Step-to pattern Gait velocity: slow    Exercises Total Joint Exercises Ankle Circles/Pumps: AROM;Both;10 reps Quad Sets: AROM;Right;10 reps Heel Slides: AAROM;10 reps;Right Straight Leg Raises: AAROM;Right;10 reps Goniometric ROM: grossly 0-60 degrees supine   PT Diagnosis: Difficulty walking  PT Problem List: Decreased strength;Decreased range of  motion;Decreased activity tolerance;Decreased knowledge of use of DME;Decreased mobility PT Treatment Interventions: DME instruction;Gait training;Stair training;Functional mobility training;Therapeutic activities;Therapeutic exercise;Patient/family education     PT Goals(Current goals can be found in the care plan section)    Visit Information  Last PT Received On: 08/30/13 Assistance Needed: +1 History of Present Illness: s/p RTKA       Prior Functioning  Home Living Family/patient expects to be discharged to:: Private residence Living Arrangements: Spouse/significant other Available Help at Discharge: Family;Friend(s) Type of Home: House Home Access: Stairs to enter [employer] of Steps: 2 Entrance Stairs-Rails: None Home Layout: Two level;Able to live on main level with bedroom/bathroom Home Equipment: Crutches Prior Function Level of Independence: Independent Comments: keeps and rides horses, very active Communication Communication: No difficulties    Cognition  Cognition Arousal/Alertness: Awake/alert Behavior During Therapy: WFL for tasks assessed/performed Overall Cognitive Status: Within Functional Limits for tasks assessed    Extremity/Trunk Assessment Lower Extremity Assessment Lower Extremity Assessment: RLE deficits/detail RLE: Unable to fully assess due to pain   Balance    End of Session PT - End of Session Equipment Utilized During Treatment: Gait belt Activity Tolerance: Patient tolerated treatment well Patient left: in chair;with call bell/phone within reach;with family/visitor present Nurse Communication: Mobility status CPM Right Knee CPM Right Knee: Off  GP     Marella Bile 08/30/2013, 7:01 PM Marella Bile, PT Pager: [phone] 08/30/2013

## 2013-08-30 NOTE — Interval H&P Note (Signed)
History and Physical Interval Note:  08/30/2013 6:59 AM  [name] [name]  has presented today for surgery, with the diagnosis of OA RIGHT KNEE   The various methods of treatment have been discussed with the patient and family. After consideration of risks, benefits and other options for treatment, the patient has consented to  Procedure(s): RIGHT TOTAL KNEE ARTHROPLASTY RIGHT  (Right) as a surgical intervention .  The patient's history has been reviewed, patient examined, no change in status, stable for surgery.  I have reviewed the patient's chart and labs.  Questions were answered to the patient's satisfaction.     Loanne Drilling

## 2013-08-30 NOTE — Plan of Care (Signed)
Problem: Consults Goal: Diagnosis- Total Joint Replacement Right total knee     

## 2013-08-30 NOTE — Anesthesia Procedure Notes (Addendum)
Spinal  Patient location during procedure: OR Start time: 08/30/2013 9:34 AM End time: 08/30/2013 9:37 AM Staffing CRNA/Resident: Raelyn Number J Performed by: resident/CRNA  Preanesthetic Checklist Completed: patient identified, site marked, surgical consent, timeout performed, IV checked and monitors and equipment checked Spinal Block Patient position: sitting Prep: Betadine Patient monitoring: heart rate, continuous pulse ox and blood pressure Approach: midline Location: L3-4 Injection technique: single-shot Needle Needle type: Sprotte  Needle gauge: 24 G Needle length: 5 cm Additional Notes No c/o pain/parathesia during procedure

## 2013-08-30 NOTE — Op Note (Signed)
I was called to see [name] [name] for foley placement after a hip implant.   He has a history of BPH with BOO and a prior TURP in 2013.   Attempts to place straight and coude catheters were unsuccessful.  I prepped the penis with betadine and instilled 8cc of sterile lubricant.  An 34fr coude foley was placed with only mild resistance.  The balloon was filled with 10cc and the foley was placed to straight drainage with clear return.     Imp: Difficult foley placement.  Plan: Remove foley per routine.

## 2013-08-30 NOTE — Transfer of Care (Signed)
Immediate Anesthesia Transfer of Care Note  Patient: [name] [name]  Procedure(s) Performed: Procedure(s): RIGHT TOTAL KNEE ARTHROPLASTY RIGHT  (Right) URETERAL CATHETER  (N/A)  Patient Location: PACU  Anesthesia Type:Regional  Level of Consciousness: awake, alert  and oriented  Airway & Oxygen Therapy: Patient Spontanous Breathing and Patient connected to face mask oxygen  Post-op Assessment: Report given to PACU RN and Post -op Vital signs reviewed and stable  Post vital signs: Reviewed and stable  Complications: No apparent anesthesia complications

## 2013-08-30 NOTE — Anesthesia Postprocedure Evaluation (Signed)
  Anesthesia Post-op Note  Patient: [name] [name]  Procedure(s) Performed: Procedure(s) (LRB): RIGHT TOTAL KNEE ARTHROPLASTY RIGHT  (Right) URETERAL CATHETER  (N/A)  Patient Location: PACU  Anesthesia Type: Spinal  Level of Consciousness: awake and alert   Airway and Oxygen Therapy: Patient Spontanous Breathing  Post-op Pain: mild  Post-op Assessment: Post-op Vital signs reviewed, Patient's Cardiovascular Status Stable, Respiratory Function Stable, Patent Airway and No signs of Nausea or vomiting  Last Vitals:  Filed Vitals:   08/30/13 1245  BP: 119/66  Pulse: 64  Temp: 36.4 C  Resp: 14    Post-op Vital Signs: stable   Complications: No apparent anesthesia complications

## 2013-08-30 NOTE — H&P (View-Only) (Signed)
[name] [name]  DOB: 1936-05-17 Married / Language: [place] / Race: White Male  Date of Admission:  08-30-2013  Chief Complaint:  Right Knee Pain  History of Present Illness The patient is a 77 year old male who comes in for a preoperative History and Physical. The patient is scheduled for a right total knee arthroplasty to be performed by Dr. Gus Rankin. Aluisio, MD on 08-30-2013. The patient is seen for bilateral knee pain for years. The patient reports left knee and right knee symptoms including: pain, swelling, instability, giving way, stiffness and soreness which began year(s) ago without any known injury. The patient describes the severity of the symptoms as severe.The patient feels that the symptoms are worsening. Past treatment for this problem has included intra-articular injection of corticosteroids and nonsteroidal anti-inflammatory drugs (celebrex). Onset of symptoms was with symptoms now occurring constantly. Mr. Granquist comes in today for evaluation of both knees, right greater than left, which has been ongoing for many years now. He denies any specific injury or trauma to the knees but states they have been getting progressively worse with time. They both have gotten worse recently. They both pop and will buckle on him first thing in the morning. He will have sedintary stiffness any time that he is sitting or resting. He denies locking. Going up an down stpes are becoming more difficult. It hurts to squatt and has some shooting pain down the leg at time. He has been told that he has arthritis and probably would need surgery at some point. He has had cortisone injections in the past which have help but his last one a year ago did not help as much. Both knees are bothering him but the right is far worse than the left. It hurts at all times. It limits what he can and cannot do. He also get a lot of swelling especially posteriorly. He has had injections in the past of  cortisone which helped for a short amount of time. Each success of injection is a little bit less. He is at a stage now where he feels like he needs to do something further in order to get these knees more functional. As stated, the right one is the main one bothering him now. He is ready to proceed with surgery on the right knee. They have been treated conservatively in the past for the above stated problem and despite conservative measures, they continue to have progressive pain and severe functional limitations and dysfunction. They have failed non-operative management including home exercise, medications, and injections. It is felt that they would benefit from undergoing total joint replacement. Risks and benefits of the procedure have been discussed with the patient and they elect to proceed with surgery. There are no active contraindications to surgery such as ongoing infection or rapidly progressive neurological disease.   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 Left Ear Hemorrhoids Diverticulosis Degenerative Disc Disease Measles Mumps   Allergies No Known Drug Allergies. 05/27/2011    Family History Heart Disease. First Degree Relatives. mother, father and sister Chronic Obstructive Lung Disease. First Degree Relatives. sister Severe allergy. sister Other medical problems. grandmother (father) alzhemers; father, arthritis, Cancer. father and grandmother mothers side Congestive Heart Failure. Father. father Hypertension. mother, father and brother Heart disease in male family member before  age 41 Diabetes Mellitus. mother and  brother    Social History Pain Contract. no Number of flights of stairs before winded. 2-3 Marital status. married Tobacco use. Never smoker. never smoker No alcohol use Previously in rehab. no Living situation. live with spouse Current work status. retired Copywriter, advertising. 3 Alcohol use. never consumed alcohol Illicit drug use. no Exercise. Exercises weekly; does individual sport Drug/Alcohol Rehab (Currently). no Post-Surgical Plans. Home Advance Directives. Living Will, Healthcare POA   Past Surgical History Other Surgery. Date: 34. lithotripsy Inguinal Hernia Repair. Date: 1999. open: bilateral Foot Surgery. Date: 07/2011. left - LisFranc Fracture Appendectomy. Date: 62. Arthroscopic Shoulder Surgery - Right Total Shoulder Replacement - Right. Date: 09/2009. TURP. Date: 2013.   Review of Systems General:Not Present- Chills, Fever, Night Sweats, Fatigue, Weight Gain, Weight Loss and Memory Loss. Skin:Not Present- Hives, Itching, Rash, Eczema and Lesions. HEENT:Present- Blurred Vision and Hearing Loss (Left Ear). Not Present- Tinnitus, Headache, Double Vision, Visual Loss and Dentures. Respiratory:Present- Chronic Cough. Not Present- Shortness of breath with exertion, Shortness of breath at rest, Allergies and Coughing up blood. Cardiovascular:Not Present- Chest Pain, Racing/skipping heartbeats, Difficulty Breathing Lying Down, Murmur, Swelling and Palpitations. Gastrointestinal:Present- Constipation (Takes Meatmucil daily). Not Present- Bloody Stool, Heartburn, Abdominal Pain, Vomiting, Nausea, Diarrhea, Difficulty Swallowing, Jaundice and Loss of appetitie. Male Genitourinary:Present- Urinary frequency and Urinating at Night. Not Present- Blood in Urine, Weak urinary stream, Discharge, Flank Pain, Incontinence, Painful Urination, Urgency and Urinary Retention. Musculoskeletal:Present- Joint Swelling, Joint Pain and Morning Stiffness. Not  Present- Muscle Weakness, Muscle Pain, Back Pain and Spasms. Neurological:Not Present- Tremor, Dizziness, Blackout spells, Paralysis, Difficulty with balance and Weakness. Psychiatric:Not Present- Insomnia.    Vitals Weight: 200 lb Height: 70 in Weight was reported by patient. Height was reported by patient. Body Surface Area: 2.12 m Body Mass Index: 28.7 kg/m Pulse: 64 (Regular) Resp.: 12 (Unlabored) BP: 140/76 (Sitting, Right Arm, Standard)     Physical Exam The physical exam findings are as follows:  Note: Patient is a 77 year old male with bilateral knee pain, right greater than left knee. Patient is accompanied today by his wife.   General Mental Status - Alert, cooperative and good historian. General Appearance- pleasant. Not in acute distress. Orientation- Oriented X3. Build & Nutrition- Well nourished and Well developed.   Head and Neck Head- normocephalic, atraumatic . Neck Global Assessment- supple. no bruit auscultated on the right and no bruit auscultated on the left.   Eye Vision- Wears corrective lenses (part of the time). Pupil- Bilateral- Regular and Round. Motion- Bilateral- EOMI.   Chest and Lung Exam Auscultation: Breath sounds:- clear at anterior chest wall and - clear at posterior chest wall. Adventitious sounds:- No Adventitious sounds.   Cardiovascular Auscultation:Rhythm- Regular rate and rhythm. Heart Sounds- S1 WNL and S2 WNL. Murmurs & Other Heart Sounds: Murmur 1:Location- Aortic Area. Timing- Early systolic. Grade- II/VI. Character- Low pitched.   Abdomen Palpation/Percussion:Tenderness- Abdomen is non-tender to palpation. Rigidity (guarding)- Abdomen is soft. Auscultation:Auscultation of the abdomen reveals - Bowel sounds normal.   Male Genitourinary Not done, not pertinent to present illness  Musculoskeletal On exam, well developed male alert and oriented in no apparent  distress. Evaluation of his hip shows normal range of motion with no discomfort. Left knee no effusion. Small Baker's cyst on the left. His range is about 0-125 with moderate crepitus on range of motion. Tenderness medial greater than lateral with no instability noted. Right knee no effusion with a large Baker's cyst. Slight varus deformity. Range  5-120. Marked crepitus on range of motion. Tenderness medial greater than lateral with no instability noted. Pulse, sensation, and motor intact both lower extremities.  RADIOGRAPHS: AP both knees and lateral show advanced end stage arthritis of the right knee bone on bone medial and patellofemoral with varus deformity. Left knee is also bone on bone but not as severe as the right.   Assessment & Plan Primary osteoarthritis of both knees (715.16) Impression: Right Knee  Note: Plan is for a Right Total Knee Replacement by Dr. Lequita Halt.  Plan is to go Home  PCP - Dr. Charmian Muff - Patient has been seen preoperatively and felt to be stable for surgery.  The patient does not have any contraindications and will receive TXA (tranexamic acid) prior to surgery.  Please note, the patient does get nauseated very easily with anesthesia.  Signed electronically by Lauraine Rinne, III PA-C

## 2013-08-30 NOTE — Progress Notes (Signed)
O.K. To go to floor when spinal level receeds 3 levels - per Dr. Council Mechanic

## 2013-08-30 NOTE — Op Note (Signed)
Pre-operative diagnosis- Osteoarthritis  Right knee(s)  Post-operative diagnosis- Osteoarthritis Right knee(s)  Procedure-  Right  Total Knee Arthroplasty  Surgeon- Gus Rankin. Larya Charpentier, MD  Assistant- Dimitri Ped, PA-C   Anesthesia-  Spinal EBL-* No blood loss amount entered *  Drains Hemovac  Tourniquet time-  Total Tourniquet Time Documented: Thigh (Right) - 44 minutes Total: Thigh (Right) - 44 minutes    Complications- None  Condition-PACU - hemodynamically stable.   Brief Clinical Note  [name] [name] is a 77 y.o. year old male with end stage OA of his right knee with progressively worsening pain and dysfunction. He has constant pain, with activity and at rest and significant functional deficits with difficulties even with ADLs. He has had extensive non-op management including analgesics, injections of cortisone and viscosupplements, and home exercise program, but remains in significant pain with significant dysfunction. Radiographs show severe bone on bone arthritis medial and patellofemoral with varus deformity. He presents now for right Total Knee Arthroplasty.    Procedure in detail---   The patient is brought into the operating room and positioned supine on the operating table. After successful administration of  Spinal,   a tourniquet is placed high on the  Right thigh(s) and the lower extremity is prepped and draped in the usual sterile fashion. Time out is performed by the operating team and then the  Right lower extremity is wrapped in Esmarch, knee flexed and the tourniquet inflated to 300 mmHg.       A midline incision is made with a ten blade through the subcutaneous tissue to the level of the extensor mechanism. A fresh blade is used to make a medial parapatellar arthrotomy. Soft tissue over the proximal medial tibia is subperiosteally elevated to the joint line with a knife and into the semimembranosus bursa with a Cobb elevator. Soft tissue over the proximal lateral  tibia is elevated with attention being paid to avoiding the patellar tendon on the tibial tubercle. The patella is everted, knee flexed 90 degrees and the ACL and PCL are removed. Findings are bone on bone medial and patellofemoral with massive global osteophytes.        The drill is used to create a starting hole in the distal femur and the canal is thoroughly irrigated with sterile saline to remove the fatty contents. The 5 degree Right  valgus alignment guide is placed into the femoral canal and the distal femoral cutting block is pinned to remove 10 mm off the distal femur. Resection is made with an oscillating saw.      The tibia is subluxed forward and the menisci are removed. The extramedullary alignment guide is placed referencing proximally at the medial aspect of the tibial tubercle and distally along the second metatarsal axis and tibial crest. The block is pinned to remove 2mm off the more deficient medial  side. Resection is made with an oscillating saw. Size 5is the most appropriate size for the tibia and the proximal tibia is prepared with the modular drill and keel punch for that size.      The femoral sizing guide is placed and size 5 is most appropriate. Rotation is marked off the epicondylar axis and confirmed by creating a rectangular flexion gap at 90 degrees. The size 5 cutting block is pinned in this rotation and the anterior, posterior and chamfer cuts are made with the oscillating saw. The intercondylar block is then placed and that cut is made.      Trial size 5 tibial  component, trial size 5 posterior stabilized femur and a 12.5  mm posterior stabilized rotating platform insert trial is placed. Full extension is achieved with excellent varus/valgus and anterior/posterior balance throughout full range of motion. The patella is everted and thickness measured to be 29  mm. Free hand resection is taken to 17 mm, a 41 template is placed, lug holes are drilled, trial patella is placed, and  it tracks normally. Osteophytes are removed off the posterior femur with the trial in place. All trials are removed and the cut bone surfaces prepared with pulsatile lavage. Cement is mixed and once ready for implantation, the size 5 tibial implant, size  5 posterior stabilized femoral component, and the size 41 patella are cemented in place and the patella is held with the clamp. The trial insert is placed and the knee held in full extension. The Exparel (20 ml mixed with 30 ml saline) and .25% Bupivicaine, are injected into the extensor mechanism, posterior capsule, medial and lateral gutters and subcutaneous tissues.  All extruded cement is removed and once the cement is hard the permanent 12.5 mm posterior stabilized rotating platform insert is placed into the tibial tray.      The wound is copiously irrigated with saline solution and the extensor mechanism closed over a hemovac drain with #1 PDS suture. The tourniquet is released for a total tourniquet time of 44  minutes. Flexion against gravity is 140 degrees and the patella tracks normally. Subcutaneous tissue is closed with 2.0 vicryl and subcuticular with running 4.0 Monocryl. The incision is cleaned and dried and steri-strips and a bulky sterile dressing are applied. The limb is placed into a knee immobilizer and the patient is awakened and transported to recovery in stable condition.      Please note that a surgical assistant was a medical necessity for this procedure in order to perform it in a safe and expeditious manner. Surgical assistant was necessary to retract the ligaments and vital neurovascular structures to prevent injury to them and also necessary for proper positioning of the limb to allow for anatomic placement of the prosthesis.   Gus Rankin Kerigan Narvaez, MD    08/30/2013, 10:49 AM

## 2013-08-31 LAB — BASIC METABOLIC PANEL
CO2: 26 mEq/L (ref 19–32)
Creatinine, Ser: 0.72 mg/dL (ref 0.50–1.35)
Glucose, Bld: 153 mg/dL — ABNORMAL HIGH (ref 70–99)
Potassium: 4.2 mEq/L (ref 3.5–5.1)
Sodium: 135 mEq/L (ref 135–145)

## 2013-08-31 LAB — CBC
Hemoglobin: 11.9 g/dL — ABNORMAL LOW (ref 13.0–17.0)
RBC: 3.85 MIL/uL — ABNORMAL LOW (ref 4.22–5.81)

## 2013-08-31 MED ORDER — HYDROMORPHONE HCL 2 MG PO TABS
2.0000 mg | ORAL_TABLET | ORAL | Status: DC | PRN
  Administered 2013-08-31: 2 mg via ORAL
  Administered 2013-08-31: 4 mg via ORAL
  Administered 2013-08-31: 2 mg via ORAL
  Administered 2013-08-31: 4 mg via ORAL
  Administered 2013-09-01 (×3): 2 mg via ORAL
  Administered 2013-09-01: 4 mg via ORAL
  Filled 2013-08-31 (×3): qty 1
  Filled 2013-08-31 (×3): qty 2
  Filled 2013-08-31 (×2): qty 1

## 2013-08-31 NOTE — Progress Notes (Signed)
Physical Therapy Treatment Patient Details Name: VERDELL DYKMAN MRN: [id number] DOB: 04-27-1936 Today's Date: 08/31/2013 Time: 6295-2841 PT Time Calculation (min): 28 min  PT Assessment / Plan / Recommendation  History of Present Illness s/p RTKA   PT Comments   POD # 1 am session.  Instructed pt and spouse on KI use for amb.  Assisted OOB to amb limited distance 2nd c/o nausea and pain.  Positioned in recliner then applied ICE.   Follow Up Recommendations  Home health PT     Does the patient have the potential to tolerate intense rehabilitation     Barriers to Discharge        Equipment Recommendations  Rolling walker with 5" wheels    Recommendations for Other Services    Frequency 7X/week   Progress towards PT Goals Progress towards PT goals: Progressing toward goals  Plan      Precautions / Restrictions Precautions Precautions: Knee Precaution Comments: Instructed pt on KI use for amb Restrictions Weight Bearing Restrictions: No Other Position/Activity Restrictions: WBAT    Pertinent Vitals/Pain C/o 8/10 pain RN notified ICE applied    Mobility  Bed Mobility Bed Mobility: Supine to Sit Supine to Sit: 4: Min assist Details for Bed Mobility Assistance: Min Assist to support R LE off  bed and increased time Transfers Transfers: Sit to Stand;Stand to Sit Sit to Stand: 4: Min assist;From elevated surface;With upper extremity assist;From bed Stand to Sit: 4: Min assist;With upper extremity assist;To chair/3-in-1 Details for Transfer Assistance: cues for sequencing and proper use of RW Ambulation/Gait Ambulation/Gait Assistance: 4: Min assist;4: Min guard Ambulation Distance (Feet): 45 Feet Assistive device: Rolling walker Ambulation/Gait Assistance Details: 50% VC's on proper walker to self distance and sequencing Gait Pattern: Step-to pattern Gait velocity: decreased     PT Goals (current goals can now be found in the care plan section)    Visit  Information  Last PT Received On: 08/31/13 Assistance Needed: +1 History of Present Illness: s/p RTKA    Subjective Data      Cognition       Balance     End of Session PT - End of Session Equipment Utilized During Treatment: Gait belt;Right knee immobilizer Activity Tolerance: Patient limited by pain Patient left: in chair;with call bell/phone within reach;with family/visitor present   Felecia Shelling  PTA [facility]  Acute  Rehab Pager      [phone]

## 2013-08-31 NOTE — Progress Notes (Signed)
   Subjective: 1 Day Post-Op Procedure(s) (LRB): RIGHT TOTAL KNEE ARTHROPLASTY RIGHT  (Right) URETERAL CATHETER  (N/A) Patient reports pain as mild.   Patient seen in rounds with Dr. Lequita Halt. Wife in room at bedside.   Patient is well, and has had no acute complaints or problems We will start therapy today.  Plan is to go Home after hospital stay.  Objective: Vital signs in last 24 hours: Temp:  [97.3 F (36.3 C)-98.4 F (36.9 C)] 98.3 F (36.8 C) (12/16 0521) Pulse Rate:  [62-69] 68 (12/16 0521) Resp:  [10-18] 16 (12/16 0521) BP: (109-142)/(48-78) 142/67 mmHg (12/16 0521) SpO2:  [98 %-100 %] 98 % (12/16 0521) Weight:  [92.987 kg (205 lb)] 92.987 kg (205 lb) (12/15 1245)  Intake/Output from previous day:  Intake/Output Summary (Last 24 hours) at 08/31/13 0901 Last data filed at 08/31/13 0539  Gross per 24 hour  Intake 3841.67 ml  Output   3516 ml  Net 325.67 ml    Labs:  Recent Labs  08/31/13 0423  HGB 11.9*    Recent Labs  08/31/13 0423  WBC 13.7*  RBC 3.85*  HCT 34.1*  PLT 169    Recent Labs  08/31/13 0423  NA 135  K 4.2  CL 102  CO2 26  BUN 18  CREATININE 0.72  GLUCOSE 153*  CALCIUM 8.3*   No results found for this basename: LABPT, INR,  in the last 72 hours  EXAM General - Patient is Alert, Appropriate and Oriented Extremity - Neurovascular intact Sensation intact distally Dorsiflexion/Plantar flexion intact Dressing - dressing C/D/I Motor Function - intact, moving foot and toes well on exam.  Hemovac pulled without difficulty.  Past Medical History  Diagnosis Date  . HYPERLIPIDEMIA 04/23/2007  . 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 HYPERTENSION 06/02/2008  . URI 09/01/2008  . ALLERGIC RHINITIS 04/23/2007  . RECTAL BLEEDING 02/04/2008    no recent problems  . BENIGN PROSTATIC HYPERTROPHY 04/23/2007    resolved after TURP  . OSTEOARTHRITIS 04/23/2007  . Palpitations 02/04/2008  . CHEST PAIN 02/04/2008  . NAUSEA 12/29/2008   ? med related, no current problems  . NEPHROLITHIASIS, HX OF 04/23/2007    pt denies this   . Cellulitis june 2013    left side("left flank")  . PONV (postoperative nausea and vomiting)   . H/O blurred vision     both eyes, occasional  . Neuropathy of left foot   . Kidney stone     Assessment/Plan: 1 Day Post-Op Procedure(s) (LRB): RIGHT TOTAL KNEE ARTHROPLASTY RIGHT  (Right) URETERAL CATHETER  (N/A) Principal Problem:   OA (osteoarthritis) of knee  Estimated body mass index is 29.41 kg/(m^2) as calculated from the following:   Height as of this encounter: 5\' 10"  (1.778 m).   Weight as of this encounter: 92.987 kg (205 lb). Up with therapy Continue foley due to strict I&O Plan for discharge tomorrow Discharge home with home health  DVT Prophylaxis - Xarelto Weight-Bearing as tolerated to right leg D/C O2 and Pulse OX and try on Room Air Keep foley until tomorrow.  Difficult foley placement with several attempts and then had to have Urology Consult post procedure to place foley catheter. Will leave in until tomorrow and have the patient follow up with his urologist on an outpatient basis.  [name] [name] 08/31/2013, 9:01 AM

## 2013-08-31 NOTE — Progress Notes (Signed)
Physical Therapy Treatment Patient Details Name: [name] [name] MRN: [id number] DOB: [birth year] Today's Date: 08/31/2013 Time: 4098-1191 PT Time Calculation (min): 11 min  PT Assessment / Plan / Recommendation  History of Present Illness s/p RTKA   PT Comments   POD # 1 pm session.  Assisted pt out of recliner to amb limited distance then assisted back to bed.  Pt c/o increased pain today "more than yesterday".   Follow Up Recommendations  Home health PT     Does the patient have the potential to tolerate intense rehabilitation     Barriers to Discharge        Equipment Recommendations  Rolling walker with 5" wheels    Recommendations for Other Services    Frequency 7X/week   Progress towards PT Goals Progress towards PT goals: Progressing toward goals  Plan      Precautions / Restrictions Precautions Precautions: Knee Precaution Comments: Instructed pt on KI use for amb Restrictions Weight Bearing Restrictions: No Other Position/Activity Restrictions: WBAT    Pertinent Vitals/Pain C/o 9/10 pain ICE applied    Mobility  Bed Mobility Bed Mobility: Sit to Supine Supine to Sit: 4: Min assist Sit to Supine: 4: Min assist Details for Bed Mobility Assistance: Min Assist to support R LE up onto bed Transfers Transfers: Sit to Stand;Stand to Sit Sit to Stand: 4: Min assist;From elevated surface;With upper extremity assist;From chair/3-in-1 Stand to Sit: 4: Min assist;With upper extremity assist;To bed Details for Transfer Assistance: cues for sequencing and proper use of RW Ambulation/Gait Ambulation/Gait Assistance: 4: Min assist;4: Min guard Ambulation Distance (Feet): 30 Feet Assistive device: Rolling walker Ambulation/Gait Assistance Details: 25% VC's on proper sequencing and increased time due to increased c/o pain Gait Pattern: Step-to pattern Gait velocity: decreased    Exercises  unable to tolerate due to current pain level    PT Goals (current goals  can now be found in the care plan section)    Visit Information  Last PT Received On: 08/31/13 Assistance Needed: +1 History of Present Illness: s/p RTKA    Subjective Data      Cognition       Balance     End of Session PT - End of Session Equipment Utilized During Treatment: Gait belt;Right knee immobilizer Activity Tolerance: Patient limited by pain Patient left: in bed;with call bell/phone within reach;with family/visitor present   Felecia Shelling  PTA [facility]  Acute  Rehab Pager      [phone]

## 2013-08-31 NOTE — Progress Notes (Signed)
OT Cancellation Note  Patient Details Name: [name] [name] MRN: [id number] DOB: Nov 03, 1935   Cancelled Treatment:    Reason Eval/Treat Not Completed: Other (comment) Will check on pt for OT either later today or in am.   Lennox Laity [phone] 08/31/2013, 1:18 PM

## 2013-09-01 LAB — CBC
HCT: 32.5 % — ABNORMAL LOW (ref 39.0–52.0)
Hemoglobin: 11.8 g/dL — ABNORMAL LOW (ref 13.0–17.0)
MCH: 32.2 pg (ref 26.0–34.0)
MCHC: 36.3 g/dL — ABNORMAL HIGH (ref 30.0–36.0)
MCV: 88.6 fL (ref 78.0–100.0)
Platelets: 170 10*3/uL (ref 150–400)
RBC: 3.67 MIL/uL — ABNORMAL LOW (ref 4.22–5.81)
RDW: 13.6 % (ref 11.5–15.5)
WBC: 18 10*3/uL — ABNORMAL HIGH (ref 4.0–10.5)

## 2013-09-01 LAB — BASIC METABOLIC PANEL
BUN: 15 mg/dL (ref 6–23)
CO2: 25 mEq/L (ref 19–32)
Calcium: 8.7 mg/dL (ref 8.4–10.5)
Creatinine, Ser: 0.74 mg/dL (ref 0.50–1.35)
GFR calc non Af Amer: 89 mL/min — ABNORMAL LOW (ref >90)
Glucose, Bld: 141 mg/dL — ABNORMAL HIGH (ref 70–99)
Sodium: 134 mEq/L — ABNORMAL LOW (ref 135–145)

## 2013-09-01 MED ORDER — TRAMADOL HCL 50 MG PO TABS: 50.0000 mg | ORAL_TABLET | Freq: Four times a day (QID) | ORAL | Status: DC | PRN

## 2013-09-01 MED ORDER — HYDROMORPHONE HCL 2 MG PO TABS: 2.0000 mg | ORAL_TABLET | ORAL | Status: DC | PRN

## 2013-09-01 MED ORDER — RIVAROXABAN 10 MG PO TABS: 10.0000 mg | ORAL_TABLET | Freq: Every day | ORAL | Status: DC

## 2013-09-01 MED ORDER — METHOCARBAMOL 500 MG PO TABS: 500.0000 mg | ORAL_TABLET | Freq: Four times a day (QID) | ORAL | Status: DC | PRN

## 2013-09-01 NOTE — Progress Notes (Signed)
Physical Therapy Treatment Patient Details Name: [name] [name] MRN: [id number] DOB: 22-Oct-1935 Today's Date: 09/01/2013 Time: 1308-6578 PT Time Calculation (min): 38 min  PT Assessment / Plan / Recommendation  History of Present Illness s/p RTKA   PT Comments   **Progressing well with mobility, pt walked 250' with RW, stair training completed, reviewed exercises with pt/wife. Ready to DC home from PT standpoint. *  Follow Up Recommendations  Home health PT     Does the patient have the potential to tolerate intense rehabilitation     Barriers to Discharge        Equipment Recommendations  Rolling walker with 5" wheels    Recommendations for Other Services    Frequency 7X/week   Progress towards PT Goals Progress towards PT goals: Progressing toward goals  Plan Current plan remains appropriate    Precautions / Restrictions Precautions Precautions: Knee Precaution Comments: Instructed pt on KI use for amb Restrictions Other Position/Activity Restrictions: WBAT   Pertinent Vitals/Pain *7/10 R knee Premedicated, ice applied**    Mobility  Bed Mobility Bed Mobility: Not assessed Details for Bed Mobility Assistance: pt up in recliner on arrival Transfers Transfers: Sit to Stand;Stand to Sit Sit to Stand: From chair/3-in-1;With upper extremity assist;5: Supervision Stand to Sit: To chair/3-in-1;With upper extremity assist;5: Supervision Details for Transfer Assistance: verbal cues for hand placement and R LE forward Ambulation/Gait Ambulation/Gait Assistance: 4: Min guard Ambulation Distance (Feet): 250 Feet Assistive device: Rolling walker Ambulation/Gait Assistance Details: pt able to increase ambulation distance today, encouraged UE WBing through RW to assist with pain control, pt states pain is better ambulating, standing rest break required due to fatigue Gait Pattern: Step-to pattern;Antalgic;Trunk flexed Gait velocity: decreased General Gait Details: steady,  no LOB  Stairs: 2 with no rails, backwards with min A to manage RW. Step-to.    Exercises Total Joint Exercises Ankle Circles/Pumps: AROM;Both;10 reps Quad Sets: AROM;Right;10 reps Towel Squeeze: AROM;Both;10 reps Short Arc QuadBarbaraann [name];Right;10 reps;Supine Heel Slides: AROM;Right;Supine;10 reps Hip ABduction/ADduction: Right;Supine;10 reps;AROM Straight Leg Raises: AAROM;Right;10 reps;Supine Long Arc Quad: AAROM;Right;10 reps;Seated Knee Flexion: AAROM;Right;Seated;10 reps Goniometric ROM: -7-35* R knee   PT Diagnosis:    PT Problem List:   PT Treatment Interventions:     PT Goals (current goals can now be found in the care plan section)    Visit Information  Last PT Received On: 09/01/13 Assistance Needed: +1 History of Present Illness: s/p RTKA    Subjective Data      Cognition  Cognition Arousal/Alertness: Awake/alert Behavior During Therapy: WFL for tasks assessed/performed Overall Cognitive Status: Within Functional Limits for tasks assessed    Balance     End of Session PT - End of Session Equipment Utilized During Treatment: Right knee immobilizer Activity Tolerance: Patient tolerated treatment well Patient left: in chair;with call bell/phone within reach;with family/visitor present   GP     [name] [name] 09/01/2013, 1:59 PM [phone]

## 2013-09-01 NOTE — Evaluation (Signed)
Occupational Therapy Evaluation Patient Details Name: [name] [name] MRN: [id number] DOB: October 20, 1935 Today's Date: 09/01/2013 Time: 4098-1191 OT Time Calculation (min): 36 min  OT Assessment / Plan / Recommendation History of present illness s/p RTKA   Clinical Impression   Pt with 10/10 pain with activity but down to 6 with rest. Pain is main limiting factor currently with ADLs. Wife can assist with LB self care.     OT Assessment  Patient needs continued OT Services    Follow Up Recommendations  Home health OT;Supervision/Assistance - 24 hour    Barriers to Discharge      Equipment Recommendations  3 in 1 bedside comode    Recommendations for Other Services    Frequency  Min 2X/week    Precautions / Restrictions Precautions Precautions: Knee Precaution Comments: Instructed pt on KI use for amb Restrictions Weight Bearing Restrictions: No Other Position/Activity Restrictions: WBAT   Pertinent Vitals/Pain 10/10 with activity (rates 12/10 initially) 6/10 with rest; reposition, rest, cold applied    ADL  Eating/Feeding: Independent Where Assessed - Eating/Feeding: Chair Grooming: Wash/dry hands;Set up Where Assessed - Grooming: Supported sitting Upper Body Bathing: Chest;Right arm;Left arm;Abdomen;Set up Where Assessed - Upper Body Bathing: Unsupported sitting Lower Body Bathing: Moderate assistance Where Assessed - Lower Body Bathing: Supported sit to stand Upper Body Dressing: Set up Where Assessed - Upper Body Dressing: Unsupported sitting Lower Body Dressing: Moderate assistance Where Assessed - Lower Body Dressing: Supported sit to Pharmacist, hospital: Minimal Web designer: Raised toilet seat with arms (or 3-in-1 over toilet) Toileting - Clothing Manipulation and Hygiene: Minimal assistance Where Assessed - Glass blower/designer Manipulation and Hygiene: Standing Equipment Used: Knee Immobilizer;Rolling walker;Long-handled shoe  horn;Long-handled sponge;Reacher;Sock aid ADL Comments: Demonstrated all AE but wife states she can assist with LB ADL. Pt transferred into bathroom to 3in1 but increased time as pt reports feeling stiff. Discussed tubbench as pt has one but currently pt requires UE support to transition sit to stand and stand to sit and feel he would have difficulty with reaching for edge of tubbench. Pt agreeable to sponge bathe iniitally and let HH assess tub transfer with bench.     OT Diagnosis: Generalized weakness;Acute pain  OT Problem List: Decreased strength;Decreased knowledge of use of DME or AE;Pain OT Treatment Interventions: Self-care/ADL training;DME and/or AE instruction;Therapeutic activities;Patient/family education   OT Goals(Current goals can be found in the care plan section) Acute Rehab OT Goals Patient Stated Goal: decrease pain OT Goal Formulation: With patient/family Time For Goal Achievement: 09/08/13 Potential to Achieve Goals: Good  Visit Information  Last OT Received On: 09/01/13 Assistance Needed: +1 History of Present Illness: s/p RTKA       Prior Functioning     Home Living Family/patient expects to be discharged to:: Private residence Living Arrangements: Spouse/significant other Available Help at Discharge: Family;Friend(s) Type of Home: House Home Access: Stairs to enter [employer] of Steps: 2 Entrance Stairs-Rails: None Home Layout: Two level;Able to live on main level with bedroom/bathroom Home Equipment: Crutches;Adaptive equipment Adaptive Equipment: Reacher Prior Function Level of Independence: Independent Comments: keeps and rides horses, very active Communication Communication: No difficulties         Vision/Perception     Cognition  Cognition Arousal/Alertness: Awake/alert Behavior During Therapy: WFL for tasks assessed/performed Overall Cognitive Status: Within Functional Limits for tasks assessed    Extremity/Trunk  Assessment Upper Extremity Assessment Upper Extremity Assessment: Overall WFL for tasks assessed     Mobility Transfers Transfers: Sit to  Stand;Stand to Sit Sit to Stand: 4: Min assist;With upper extremity assist;From chair/3-in-1 Stand to Sit: 4: Min assist;With upper extremity assist;To chair/3-in-1 Details for Transfer Assistance: verbal cues for hand placement and LE management.     Exercise     Balance Balance Balance Assessed: Yes Dynamic Standing Balance Dynamic Standing - Level of Assistance: 4: Min assist   End of Session OT - End of Session Equipment Utilized During Treatment: Right knee immobilizer;Rolling walker;Gait belt Activity Tolerance: Patient limited by pain Patient left: in chair;with call bell/phone within reach;with family/visitor present  GO     Lennox Laity [phone] 09/01/2013, 9:21 AM

## 2013-09-01 NOTE — Discharge Summary (Signed)
Physician Discharge Summary   Patient ID: [name] [name] MRN: [id number] DOB/AGE: 75-Jan-1937 75 y.o.  Admit date: 08/30/2013 Discharge date: 09/01/2013  Primary Diagnosis:  Osteoarthritis Right knee(s)  Admission Diagnoses:  Past Medical History  Diagnosis Date  . HYPERLIPIDEMIA 04/23/2007  . 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 HYPERTENSION 06/02/2008  . URI 09/01/2008  . ALLERGIC RHINITIS 04/23/2007  . RECTAL BLEEDING 02/04/2008    no recent problems  . BENIGN PROSTATIC HYPERTROPHY 04/23/2007    resolved after TURP  . OSTEOARTHRITIS 04/23/2007  . Palpitations 02/04/2008  . CHEST PAIN 02/04/2008  . NAUSEA 12/29/2008    ? med related, no current problems  . NEPHROLITHIASIS, HX OF 04/23/2007    pt denies this   . Cellulitis june 2013    left side("left flank")  . PONV (postoperative nausea and vomiting)   . H/O blurred vision     both eyes, occasional  . Neuropathy of left foot   . Kidney stone    Discharge Diagnoses:   Principal Problem:   OA (osteoarthritis) of knee  Estimated body mass index is 29.41 kg/(m^2) as calculated from the following:   Height as of this encounter: 5\' 10"  (1.778 m).   Weight as of this encounter: 92.987 kg (205 lb).  Procedure:  Procedure(s) (LRB): RIGHT TOTAL KNEE ARTHROPLASTY RIGHT  (Right) URETERAL CATHETER  (N/A)   Consults: urology  HPI: [name] [name] is a 77 y.o. year old male with end stage OA of his right knee with progressively worsening pain and dysfunction. He has constant pain, with activity and at rest and significant functional deficits with difficulties even with ADLs. He has had extensive non-op management including analgesics, injections of cortisone and viscosupplements, and home exercise program, but remains in significant pain with significant dysfunction. Radiographs show severe bone on bone arthritis medial and patellofemoral with varus deformity. He presents now for right Total Knee Arthroplasty.    Laboratory Data: Admission on 08/30/2013, Discharged on 09/01/2013  Component Date Value Range Status  . ABO/RH(D) 08/30/2013 O NEG   Final  . Antibody Screen 08/30/2013 NEG   Final  . Sample Expiration 08/30/2013 09/02/2013   Final  . ABO/RH(D) 08/30/2013 O NEG   Final  . WBC 08/31/2013 13.7* 4.0 - 10.5 K/uL Final  . RBC 08/31/2013 3.85* 4.22 - 5.81 MIL/uL Final  . Hemoglobin 08/31/2013 11.9* 13.0 - 17.0 g/dL Final  . HCT 40/98/1191 34.1* 39.0 - 52.0 % Final  . MCV 08/31/2013 88.6  78.0 - 100.0 fL Final  . MCH 08/31/2013 30.9  26.0 - 34.0 pg Final  . MCHC 08/31/2013 34.9  30.0 - 36.0 g/dL Final  . RDW 47/82/9562 13.2  11.5 - 15.5 % Final  . Platelets 08/31/2013 169  150 - 400 K/uL Final  . Sodium 08/31/2013 135  135 - 145 mEq/L Final  . Potassium 08/31/2013 4.2  3.5 - 5.1 mEq/L Final  . Chloride 08/31/2013 102  96 - 112 mEq/L Final  . CO2 08/31/2013 26  19 - 32 mEq/L Final  . Glucose, Bld 08/31/2013 153* 70 - 99 mg/dL Final  . BUN 13/04/6577 18  6 - 23 mg/dL Final  . Creatinine, Ser 08/31/2013 0.72  0.50 - 1.35 mg/dL Final  . Calcium 46/96/2952 8.3* 8.4 - 10.5 mg/dL Final  . GFR calc non Af Amer 08/31/2013 90* >90 mL/min Final  . GFR calc Af Amer 08/31/2013 >90  >90 mL/min Final   Comment: (NOTE)  The eGFR has been calculated using the CKD EPI equation.                          This calculation has not been validated in all clinical situations.                          eGFR's persistently <90 mL/min signify possible Chronic Kidney                          Disease.  . WBC 09/01/2013 18.0* 4.0 - 10.5 K/uL Final  . RBC 09/01/2013 3.67* 4.22 - 5.81 MIL/uL Final  . Hemoglobin 09/01/2013 11.8* 13.0 - 17.0 g/dL Final  . HCT 16/06/9603 32.5* 39.0 - 52.0 % Final  . MCV 09/01/2013 88.6  78.0 - 100.0 fL Final  . MCH 09/01/2013 32.2  26.0 - 34.0 pg Final  . MCHC 09/01/2013 36.3* 30.0 - 36.0 g/dL Final  . RDW 54/05/8118 13.6  11.5 - 15.5 % Final  . Platelets  09/01/2013 170  150 - 400 K/uL Final  . Sodium 09/01/2013 134* 135 - 145 mEq/L Final  . Potassium 09/01/2013 4.2  3.5 - 5.1 mEq/L Final  . Chloride 09/01/2013 98  96 - 112 mEq/L Final  . CO2 09/01/2013 25  19 - 32 mEq/L Final  . Glucose, Bld 09/01/2013 141* 70 - 99 mg/dL Final  . BUN 14/78/2956 15  6 - 23 mg/dL Final  . Creatinine, Ser 09/01/2013 0.74  0.50 - 1.35 mg/dL Final  . Calcium 21/30/8657 8.7  8.4 - 10.5 mg/dL Final  . GFR calc non Af Amer 09/01/2013 89* >90 mL/min Final  . GFR calc Af Amer 09/01/2013 >90  >90 mL/min Final   Comment: (NOTE)                          The eGFR has been calculated using the CKD EPI equation.                          This calculation has not been validated in all clinical situations.                          eGFR's persistently <90 mL/min signify possible Chronic Kidney                          Disease.  Hospital Outpatient Visit on 08/23/2013  Component Date Value Range Status  . MRSA, PCR 08/23/2013 NEGATIVE  NEGATIVE Final  . Staphylococcus aureus 08/23/2013 NEGATIVE  NEGATIVE Final   Comment:                                 The Xpert SA Assay (FDA                          approved for NASAL specimens                          in patients over 8 years of age),  is one component of                          a comprehensive surveillance                          program.  Test performance has                          been validated by [facility] for patients greater                          than or equal to 102 year old.                          It is not intended                          to diagnose infection nor to                          guide or monitor treatment.  Marland Kitchen aPTT 08/23/2013 26  24 - 37 seconds Final  . WBC 08/23/2013 9.0  4.0 - 10.5 K/uL Final  . RBC 08/23/2013 4.67  4.22 - 5.81 MIL/uL Final  . Hemoglobin 08/23/2013 14.6  13.0 - 17.0 g/dL Final  . HCT 16/06/9603 41.2  39.0 - 52.0 %  Final  . MCV 08/23/2013 88.2  78.0 - 100.0 fL Final  . MCH 08/23/2013 31.3  26.0 - 34.0 pg Final  . MCHC 08/23/2013 35.4  30.0 - 36.0 g/dL Final  . RDW 54/05/8118 13.5  11.5 - 15.5 % Final  . Platelets 08/23/2013 164  150 - 400 K/uL Final  . Sodium 08/23/2013 139  135 - 145 mEq/L Final  . Potassium 08/23/2013 4.2  3.5 - 5.1 mEq/L Final  . Chloride 08/23/2013 103  96 - 112 mEq/L Final  . CO2 08/23/2013 24  19 - 32 mEq/L Final  . Glucose, Bld 08/23/2013 94  70 - 99 mg/dL Final  . BUN 14/78/2956 29* 6 - 23 mg/dL Final  . Creatinine, Ser 08/23/2013 0.66  0.50 - 1.35 mg/dL Final  . Calcium 21/30/8657 9.6  8.4 - 10.5 mg/dL Final  . Total Protein 08/23/2013 7.3  6.0 - 8.3 g/dL Final  . Albumin 84/69/6295 4.3  3.5 - 5.2 g/dL Final  . AST 28/41/3244 24  0 - 37 U/L Final  . ALT 08/23/2013 25  0 - 53 U/L Final  . Alkaline Phosphatase 08/23/2013 68  39 - 117 U/L Final  . Total Bilirubin 08/23/2013 0.8  0.3 - 1.2 mg/dL Final  . GFR calc non Af Amer 08/23/2013 >90  >90 mL/min Final  . GFR calc Af Amer 08/23/2013 >90  >90 mL/min Final   Comment: (NOTE)                          The eGFR has been calculated using the CKD EPI equation.                          This calculation  has not been validated in all clinical situations.                          eGFR's persistently <90 mL/min signify possible Chronic Kidney                          Disease.  Marland Kitchen Prothrombin Time 08/23/2013 12.6  11.6 - 15.2 seconds Final  . INR 08/23/2013 0.96  0.00 - 1.49 Final  . Color, Urine 08/23/2013 YELLOW  YELLOW Final  . APPearance 08/23/2013 CLEAR  CLEAR Final  . Specific Gravity, Urine 08/23/2013 1.019  1.005 - 1.030 Final  . pH 08/23/2013 5.5  5.0 - 8.0 Final  . Glucose, UA 08/23/2013 NEGATIVE  NEGATIVE mg/dL Final  . Hgb urine dipstick 08/23/2013 NEGATIVE  NEGATIVE Final  . Bilirubin Urine 08/23/2013 NEGATIVE  NEGATIVE Final  . Ketones, ur 08/23/2013 NEGATIVE  NEGATIVE mg/dL Final  . Protein, ur 95/18/8416 NEGATIVE   NEGATIVE mg/dL Final  . Urobilinogen, UA 08/23/2013 0.2  0.0 - 1.0 mg/dL Final  . Nitrite 60/63/0160 NEGATIVE  NEGATIVE Final  . Leukocytes, UA 08/23/2013 NEGATIVE  NEGATIVE Final   MICROSCOPIC NOT DONE ON URINES WITH NEGATIVE PROTEIN, BLOOD, LEUKOCYTES, NITRITE, OR GLUCOSE <1000 mg/dL.     X-Rays:Dg Chest 2 View  08/23/2013   CLINICAL DATA:  Preop for total knee replacement  EXAM: CHEST  2 VIEW  COMPARISON:  March 23, 2012  FINDINGS: Heart size and vascular pattern are normal. Lungs are clear. No pleural effusions. Bony proliferation left humeral head/neck, stable. Right shoulder replacement stable.  IMPRESSION: No acute findings.   Electronically Signed   By: Esperanza Heir M.D.   On: 08/23/2013 16:17    EKG: Orders placed during the hospital encounter of 08/23/13  . EKG 12-LEAD  . EKG 12-LEAD     Hospital Course: WAKE CONLEE is a 77 y.o. who was admitted to [facility]. They were brought to the operating room on 08/30/2013 and underwent Procedure(s): RIGHT TOTAL KNEE ARTHROPLASTY RIGHT  URETERAL CATHETER .  Patient tolerated the procedure well and was later transferred to the recovery room and then to the orthopaedic floor for postoperative care.  Difficult foley placement with several attempts and then had to have Urology Consult post procedure to place foley catheter.  They were given PO and IV analgesics for pain control following their surgery.  They were given 24 hours of postoperative antibiotics of  Anti-infectives   Start     Dose/Rate Route Frequency Ordered Stop   08/30/13 1600  ceFAZolin (ANCEF) IVPB 2 g/50 mL premix     2 g 100 mL/hr over 30 Minutes Intravenous Every 6 hours 08/30/13 1253 08/30/13 2148   08/30/13 0645  ceFAZolin (ANCEF) IVPB 2 g/50 mL premix     2 g 100 mL/hr over 30 Minutes Intravenous On call to O.R. 08/30/13 1093 08/30/13 0940     and started on DVT prophylaxis in the form of Xarelto.   PT and OT were ordered for total joint protocol.   Discharge planning consulted to help with postop disposition and equipment needs.  Patient had a decent night on the evening of surgery.  They started to get up OOB with therapy on day one. Hemovac drain was pulled without difficulty. Difficult foley catheter placement following surgery. Left in until day two and have the patient follow up with his urologist on an outpatient basis.  Continued  to work with therapy into day two.  Dressing was changed on day two and the incision was healing well. Foley removed to ensure he could void.  Patient was seen in rounds and was ready to go home later that same day.   Discharge Medications: Prior to Admission medications   Medication Sig Start Date End Date Taking? Authorizing Provider  b complex vitamins tablet Take 1 tablet by mouth daily.    Yes Historical Provider, MD  benazepril-hydrochlorthiazide (LOTENSIN HCT) 20-12.5 MG per tablet Take 1 tablet by mouth every morning.   Yes Historical Provider, MD  celecoxib (CELEBREX) 200 MG capsule Take 200 mg by mouth at bedtime.   Yes Historical Provider, MD  Coenzyme Q10 (CO Q-10) 100 MG CAPS Take 100 mg by mouth daily.    Yes Historical Provider, MD  fluticasone (FLONASE) 50 MCG/ACT nasal spray Place 1 spray into both nostrils daily.   Yes Historical Provider, MD  gabapentin (NEURONTIN) 600 MG tablet Take 600 mg by mouth 3 (three) times daily.   Yes Historical Provider, MD  Glucosamine-Chondroit-Vit C-Mn (GLUCOSAMINE 1500 COMPLEX PO) Take 1 tablet by mouth 2 (two) times daily.    Yes Historical Provider, MD  Boris Lown Oil 500 MG CAPS Take 500 mg by mouth daily.    Yes Historical Provider, MD  loratadine (CLARITIN) 10 MG tablet Take 10 mg by mouth every evening.    Yes Historical Provider, MD  Multiple Vitamin (MULTIVITAMIN) tablet Take 1 tablet by mouth daily.     Yes Historical Provider, MD  OVER THE COUNTER MEDICATION Take 3 tablets by mouth daily. NAPRO-ZYN  JOINT INFLAMMATION PREVENTION   Yes Historical Provider,  MD  Potassium Citrate (UROCIT-K 10 PO) Take 2 tablets by mouth 2 (two) times daily.    Yes Historical Provider, MD  Pseudoephedrine-Guaifenesin (MUCINEX D PO) Take 400 mg by mouth every 4 (four) hours.   Yes Historical Provider, MD  psyllium (HYDROCIL/METAMUCIL) 95 % PACK Take 1 packet by mouth daily.   Yes Historical Provider, MD  QUERCETIN PO Take 350 mg by mouth daily.   Yes Historical Provider, MD  simvastatin (ZOCOR) 40 MG tablet Take 40 mg by mouth every morning.   Yes Historical Provider, MD  aspirin EC 81 MG tablet Take 81 mg by mouth daily.    Historical Provider, MD  HYDROmorphone (DILAUDID) 2 MG tablet Take 1-2 tablets (2-4 mg total) by mouth every 3 (three) hours as needed for severe pain. 09/01/13   Alexzandrew Julien Girt, PA-C  methocarbamol (ROBAXIN) 500 MG tablet Take 1 tablet (500 mg total) by mouth every 6 (six) hours as needed for muscle spasms. 09/01/13   Alexzandrew Julien Girt, PA-C  rivaroxaban (XARELTO) 10 MG TABS tablet Take 1 tablet (10 mg total) by mouth daily with breakfast. Take Xarelto for two and a half more weeks, then discontinue Xarelto. Once the patient has completed the blood thinner regimen, then take a Baby 81 mg Aspirin daily for four more weeks.  May resume Celebrex at that time also. 09/01/13   Alexzandrew Perkins, PA-C  traMADol (ULTRAM) 50 MG tablet Take 1-2 tablets (50-100 mg total) by mouth every 6 (six) hours as needed (mild pain). 09/01/13   Alexzandrew Julien Girt, PA-C   Discharge home with home health  Diet - Cardiac diet  Follow up - in 2 weeks  Activity - WBAT  Disposition - Home  Condition Upon Discharge - Good  D/C Meds - See DC Summary  DVT Prophylaxis - Xarelto      Discharge  Orders   Future Appointments Provider Department Dept Phone   08/25/2014 9:30 AM [name], MD [facility] [phone]   Future Orders Complete By Expires   Call MD / Call 911  As directed    Comments:     If you experience chest pain or  shortness of breath, CALL 911 and be transported to the hospital emergency room.  If you develope a fever above 101 F, pus (white drainage) or increased drainage or redness at the wound, or calf pain, call your surgeon's office.   Change dressing  As directed    Comments:     Change dressing daily with sterile 4 x 4 inch gauze dressing and apply TED hose. Do not submerge the incision under water.   Constipation Prevention  As directed    Comments:     Drink plenty of fluids.  Prune juice may be helpful.  You may use a stool softener, such as Colace (over the counter) 100 mg twice a day.  Use MiraLax (over the counter) for constipation as needed.   Diet - low sodium heart healthy  As directed    Discharge instructions  As directed    Comments:     Pick up stool softner and laxative for home. Do not submerge incision under water. May shower. Continue to use ice for pain and swelling from surgery.  Take Xarelto for two and a half more weeks, then discontinue Xarelto. Once the patient has completed the blood thinner regimen, then take a Baby 81 mg Aspirin daily for four more weeks.   Do not put a pillow under the knee. Place it under the heel.  As directed    Do not sit on low chairs, stoools or toilet seats, as it may be difficult to get up from low surfaces  As directed    Driving restrictions  As directed    Comments:     No driving until released by the physician.   Increase activity slowly as tolerated  As directed    Lifting restrictions  As directed    Comments:     No lifting until released by the physician.   Patient may shower  As directed    Comments:     You may shower without a dressing once there is no drainage.  Do not wash over the wound.  If drainage remains, do not shower until drainage stops.   TED hose  As directed    Comments:     Use stockings (TED hose) for 3 weeks on both leg(s).  You may remove them at night for sleeping.   Weight bearing as tolerated  As directed     Questions:     Laterality:     Extremity:         Medication List    STOP taking these medications       aspirin EC 81 MG tablet     b complex vitamins tablet     celecoxib 200 MG capsule  Commonly known as:  CELEBREX     Co Q-10 100 MG Caps     GLUCOSAMINE 1500 COMPLEX PO     Krill Oil 500 MG Caps     multivitamin tablet     OVER THE COUNTER MEDICATION     QUERCETIN PO      TAKE these medications       benazepril-hydrochlorthiazide 20-12.5 MG per tablet  Commonly known as:  LOTENSIN HCT  Take 1  tablet by mouth every morning.     fluticasone 50 MCG/ACT nasal spray  Commonly known as:  FLONASE  Place 1 spray into both nostrils daily.     gabapentin 600 MG tablet  Commonly known as:  NEURONTIN  Take 600 mg by mouth 3 (three) times daily.     HYDROmorphone 2 MG tablet  Commonly known as:  DILAUDID  Take 1-2 tablets (2-4 mg total) by mouth every 3 (three) hours as needed for severe pain.     loratadine 10 MG tablet  Commonly known as:  CLARITIN  Take 10 mg by mouth every evening.     methocarbamol 500 MG tablet  Commonly known as:  ROBAXIN  Take 1 tablet (500 mg total) by mouth every 6 (six) hours as needed for muscle spasms.     MUCINEX D PO  Take 400 mg by mouth every 4 (four) hours.     psyllium 95 % Pack  Commonly known as:  HYDROCIL/METAMUCIL  Take 1 packet by mouth daily.     rivaroxaban 10 MG Tabs tablet  Commonly known as:  XARELTO  - Take 1 tablet (10 mg total) by mouth daily with breakfast. Take Xarelto for two and a half more weeks, then discontinue Xarelto.  - Once the patient has completed the blood thinner regimen, then take a Baby 81 mg Aspirin daily for four more weeks.  May resume Celebrex at that time also.     simvastatin 40 MG tablet  Commonly known as:  ZOCOR  Take 40 mg by mouth every morning.     traMADol 50 MG tablet  Commonly known as:  ULTRAM  Take 1-2 tablets (50-100 mg total) by mouth every 6 (six) hours as needed  (mild pain).     UROCIT-K 10 PO  Take 2 tablets by mouth 2 (two) times daily.       Follow-up Information   Follow up with Loanne Drilling, MD. Schedule an appointment as soon as possible for a visit on 09/14/2013.   Specialty:  Orthopedic Surgery   Contact information:   [address] [place] [address] [phone]       Signed: Patrica Duel 09/02/2013, 7:53 AM

## 2013-09-01 NOTE — Care Management Note (Signed)
    Page 1 of 2   09/01/2013     2:56:46 PM   CARE MANAGEMENT NOTE 09/01/2013  Patient:  [name] [name], [name] [name]   Account Number:  [id number]  Date Initiated:  09/01/2013  Documentation initiated by:  Colleen Can  Subjective/Objective Assessment:   dx total rt knee replacemnt     Action/Plan:   CM spoke with patient and spouse. Plans are for patient to return to his home where spouse will be caregiver. He will need dme & HH services   Anticipated DC Date:  09/01/2013   Anticipated DC Plan:  HOME W HOME HEALTH SERVICES  In-house referral  Clinical Social Worker      DC Associate Professor  CM consult      PAC Choice  DURABLE MEDICAL EQUIPMENT  HOME HEALTH   Choice offered to / List presented to:  C-1 Patient   DME arranged  3-N-1  Levan Hurst      DME agency  [facility]     HH arranged  HH-2 PT  HH-3 OT      [facility] agency  [facility]   Status of service:  Completed, signed off Medicare Important Message given?  NA - LOS <3 / Initial given by admissions (If response is "NO", the following Medicare IM given date fields will be blank) Date Medicare IM given:   Date Additional Medicare IM given:    Discharge Disposition:  HOME W HOME HEALTH SERVICES  Per UR Regulation:    If discussed at Long Length of Stay Meetings, dates discussed:    Comments:

## 2013-09-01 NOTE — Progress Notes (Signed)
Physical Therapy Treatment Patient Details Name: [name] [name] MRN: [id number] DOB: 06-Jan-1936 Today's Date: 09/01/2013 Time: 4098-1191 PT Time Calculation (min): 31 min  PT Assessment / Plan / Recommendation  History of Present Illness s/p RTKA   PT Comments   Pt able to progress ambulation today and also performed LE exercises.  Still needs to practice stairs prior to d/c.   Follow Up Recommendations  Home health PT     Does the patient have the potential to tolerate intense rehabilitation     Barriers to Discharge        Equipment Recommendations  Rolling walker with 5" wheels    Recommendations for Other Services    Frequency 7X/week   Progress towards PT Goals Progress towards PT goals: Progressing toward goals  Plan Current plan remains appropriate    Precautions / Restrictions Precautions Precautions: Knee Precaution Comments: Instructed pt on KI use for amb Restrictions Weight Bearing Restrictions: No Other Position/Activity Restrictions: WBAT   Pertinent Vitals/Pain States pain is better today and wishes to proceed with therapy session despite moderate pain, ice packs available to pt (spouse was going to assist dressing and then place ice packs after session)    Mobility  Bed Mobility Bed Mobility: Not assessed Details for Bed Mobility Assistance: pt up in recliner on arrival Transfers Transfers: Sit to Stand;Stand to Sit Sit to Stand: 4: Min guard;From chair/3-in-1;With upper extremity assist Stand to Sit: To chair/3-in-1;4: Min guard;With upper extremity assist Details for Transfer Assistance: verbal cues for hand placement and R LE forward Ambulation/Gait Ambulation/Gait Assistance: 4: Min guard Ambulation Distance (Feet): 80 Feet (x2) Assistive device: Rolling walker Ambulation/Gait Assistance Details: pt able to increase ambulation distance today, encouraged UE WBing through RW to assist with pain control, pt states pain is better ambulating,  standing rest break required due to fatigue Gait Pattern: Step-to pattern;Antalgic;Trunk flexed Gait velocity: decreased    Exercises Total Joint Exercises Ankle Circles/Pumps: AROM;Both;10 reps Quad Sets: AROM;Right;15 reps Short Arc QuadBarbaraann Boys;Right;15 reps Heel Slides: AROM;Right;15 reps;Supine Hip ABduction/ADduction: Right;AAROM;Supine;15 reps Straight Leg Raises: AAROM;Right;10 reps;Supine Goniometric ROM: grossly -7-35* with heel slides, limited by pain   PT Diagnosis:    PT Problem List:   PT Treatment Interventions:     PT Goals (current goals can now be found in the care plan section) Acute Rehab PT Goals Patient Stated Goal: decrease pain  Visit Information  Last PT Received On: 09/01/13 Assistance Needed: +1 History of Present Illness: s/p RTKA    Subjective Data  Patient Stated Goal: decrease pain   Cognition  Cognition Arousal/Alertness: Awake/alert Behavior During Therapy: WFL for tasks assessed/performed Overall Cognitive Status: Within Functional Limits for tasks assessed    Balance  Balance Balance Assessed: Yes Dynamic Standing Balance Dynamic Standing - Level of Assistance: 4: Min assist  End of Session PT - End of Session Equipment Utilized During Treatment: Right knee immobilizer Activity Tolerance: Patient limited by pain Patient left: in chair;with family/visitor present;with call bell/phone within reach   GP     Tyse Auriemma,KATHrine E 09/01/2013, 12:39 PM Zenovia Jarred, PT, DPT 09/01/2013 Pager: [phone]

## 2013-09-01 NOTE — Progress Notes (Signed)
   Subjective: 2 Days Post-Op Procedure(s) (LRB): RIGHT TOTAL KNEE ARTHROPLASTY RIGHT  (Right) URETERAL CATHETER  (N/A) Patient reports pain as mild.   Patient seen in rounds for Dr. Lequita Halt. Patient is well, and has had no acute complaints or problems Patient is ready to go home  Objective: Vital signs in last 24 hours: Temp:  [98.8 F (37.1 C)-100.2 F (37.9 C)] 98.8 F (37.1 C) (12/17 0635) Pulse Rate:  [69-80] 69 (12/17 0635) Resp:  [16] 16 (12/17 0635) BP: (136-153)/(67-72) 136/70 mmHg (12/17 0635) SpO2:  [94 %-96 %] 95 % (12/17 0635)  Intake/Output from previous day:  Intake/Output Summary (Last 24 hours) at 09/01/13 1039 Last data filed at 09/01/13 1030  Gross per 24 hour  Intake    720 ml  Output   5550 ml  Net  -4830 ml    Intake/Output this shift: Total I/O In: 240 [P.O.:240] Out: 300 [Urine:300]  Labs:  Recent Labs  08/31/13 0423 09/01/13 0405  HGB 11.9* 11.8*    Recent Labs  08/31/13 0423 09/01/13 0405  WBC 13.7* 18.0*  RBC 3.85* 3.67*  HCT 34.1* 32.5*  PLT 169 170    Recent Labs  08/31/13 0423 09/01/13 0405  NA 135 134*  K 4.2 4.2  CL 102 98  CO2 26 25  BUN 18 15  CREATININE 0.72 0.74  GLUCOSE 153* 141*  CALCIUM 8.3* 8.7   No results found for this basename: LABPT, INR,  in the last 72 hours  EXAM: General - Patient is Alert, Appropriate and Oriented Extremity - Neurovascular intact Sensation intact distally Incision - clean, dry, no drainage, healing Motor Function - intact, moving foot and toes well on exam.   Assessment/Plan: 2 Days Post-Op Procedure(s) (LRB): RIGHT TOTAL KNEE ARTHROPLASTY RIGHT  (Right) URETERAL CATHETER  (N/A) Procedure(s) (LRB): RIGHT TOTAL KNEE ARTHROPLASTY RIGHT  (Right) URETERAL CATHETER  (N/A) Past Medical History  Diagnosis Date  . HYPERLIPIDEMIA 04/23/2007  . 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 HYPERTENSION 06/02/2008  . URI 09/01/2008  . ALLERGIC RHINITIS 04/23/2007  .  RECTAL BLEEDING 02/04/2008    no recent problems  . BENIGN PROSTATIC HYPERTROPHY 04/23/2007    resolved after TURP  . OSTEOARTHRITIS 04/23/2007  . Palpitations 02/04/2008  . CHEST PAIN 02/04/2008  . NAUSEA 12/29/2008    ? med related, no current problems  . NEPHROLITHIASIS, HX OF 04/23/2007    pt denies this   . Cellulitis june 2013    left side("left flank")  . PONV (postoperative nausea and vomiting)   . H/O blurred vision     both eyes, occasional  . Neuropathy of left foot   . Kidney stone    Principal Problem:   OA (osteoarthritis) of knee  Estimated body mass index is 29.41 kg/(m^2) as calculated from the following:   Height as of this encounter: 5\' 10"  (1.778 m).   Weight as of this encounter: 92.987 kg (205 lb). Advance diet Up with therapy Discharge home with home health Diet - Cardiac diet Follow up - in 2 weeks Activity - WBAT Disposition - Home Condition Upon Discharge - Good D/C Meds - See DC Summary DVT Prophylaxis - Xarelto  [name] [name] 09/01/2013, 10:39 AM

## 2013-09-02 ENCOUNTER — Encounter (HOSPITAL_COMMUNITY): Payer: Self-pay | Admitting: Emergency Medicine

## 2013-09-02 ENCOUNTER — Emergency Department (HOSPITAL_COMMUNITY)
Admission: EM | Admit: 2013-09-02 | Discharge: 2013-09-02 | Disposition: A | Discharge status: Home / Self Care | Payer: Medicare Other | Attending: Emergency Medicine | Admitting: Emergency Medicine

## 2013-09-02 ENCOUNTER — Emergency Department (HOSPITAL_COMMUNITY): Payer: Medicare Other

## 2013-09-02 DIAGNOSIS — Y838 Other surgical procedures as the cause of abnormal reaction of the patient, or of later complication, without mention of misadventure at the time of the procedure: Secondary | ICD-10-CM | POA: Insufficient documentation

## 2013-09-02 DIAGNOSIS — Z8709 Personal history of other diseases of the respiratory system: Secondary | ICD-10-CM | POA: Insufficient documentation

## 2013-09-02 DIAGNOSIS — IMO0001 Reserved for inherently not codable concepts without codable children: Secondary | ICD-10-CM

## 2013-09-02 DIAGNOSIS — Z79899 Other long term (current) drug therapy: Secondary | ICD-10-CM | POA: Insufficient documentation

## 2013-09-02 DIAGNOSIS — Z87442 Personal history of urinary calculi: Secondary | ICD-10-CM | POA: Insufficient documentation

## 2013-09-02 DIAGNOSIS — Z792 Long term (current) use of antibiotics: Secondary | ICD-10-CM | POA: Insufficient documentation

## 2013-09-02 DIAGNOSIS — E785 Hyperlipidemia, unspecified: Secondary | ICD-10-CM | POA: Insufficient documentation

## 2013-09-02 DIAGNOSIS — N390 Urinary tract infection, site not specified: Secondary | ICD-10-CM | POA: Insufficient documentation

## 2013-09-02 DIAGNOSIS — I10 Essential (primary) hypertension: Secondary | ICD-10-CM | POA: Insufficient documentation

## 2013-09-02 DIAGNOSIS — Z96659 Presence of unspecified artificial knee joint: Secondary | ICD-10-CM | POA: Insufficient documentation

## 2013-09-02 DIAGNOSIS — Z7901 Long term (current) use of anticoagulants: Secondary | ICD-10-CM | POA: Insufficient documentation

## 2013-09-02 DIAGNOSIS — Z872 Personal history of diseases of the skin and subcutaneous tissue: Secondary | ICD-10-CM | POA: Insufficient documentation

## 2013-09-02 DIAGNOSIS — R059 Cough, unspecified: Secondary | ICD-10-CM | POA: Insufficient documentation

## 2013-09-02 DIAGNOSIS — T8140XA Infection following a procedure, unspecified, initial encounter: Secondary | ICD-10-CM | POA: Insufficient documentation

## 2013-09-02 DIAGNOSIS — G5790 Unspecified mononeuropathy of unspecified lower limb: Secondary | ICD-10-CM | POA: Insufficient documentation

## 2013-09-02 DIAGNOSIS — M1990 Unspecified osteoarthritis, unspecified site: Secondary | ICD-10-CM | POA: Insufficient documentation

## 2013-09-02 DIAGNOSIS — IMO0002 Reserved for concepts with insufficient information to code with codable children: Secondary | ICD-10-CM | POA: Insufficient documentation

## 2013-09-02 DIAGNOSIS — R5082 Postprocedural fever: Secondary | ICD-10-CM

## 2013-09-02 DIAGNOSIS — R05 Cough: Secondary | ICD-10-CM | POA: Insufficient documentation

## 2013-09-02 LAB — URINALYSIS, ROUTINE W REFLEX MICROSCOPIC
Bilirubin Urine: NEGATIVE
Glucose, UA: NEGATIVE mg/dL
Ketones, ur: NEGATIVE mg/dL
Protein, ur: 30 mg/dL — AB
pH: 6 (ref 5.0–8.0)

## 2013-09-02 LAB — COMPREHENSIVE METABOLIC PANEL
ALT: 18 U/L (ref 0–53)
AST: 18 U/L (ref 0–37)
Albumin: 3.3 g/dL — ABNORMAL LOW (ref 3.5–5.2)
Alkaline Phosphatase: 62 U/L (ref 39–117)
BUN: 22 mg/dL (ref 6–23)
CO2: 26 mEq/L (ref 19–32)
Calcium: 8.9 mg/dL (ref 8.4–10.5)
Chloride: 94 mEq/L — ABNORMAL LOW (ref 96–112)
Creatinine, Ser: 0.78 mg/dL (ref 0.50–1.35)
GFR calc Af Amer: >90 mL/min (ref >90)
GFR calc non Af Amer: 87 mL/min — ABNORMAL LOW (ref >90)
Glucose, Bld: 118 mg/dL — ABNORMAL HIGH (ref 70–99)
Potassium: 3.7 mEq/L (ref 3.5–5.1)
Sodium: 130 mEq/L — ABNORMAL LOW (ref 135–145)
Total Bilirubin: 1.4 mg/dL — ABNORMAL HIGH (ref 0.3–1.2)
Total Protein: 6.6 g/dL (ref 6.0–8.3)

## 2013-09-02 LAB — CG4 I-STAT (LACTIC ACID): Lactic Acid, Venous: 1.07 mmol/L (ref 0.5–2.2)

## 2013-09-02 LAB — URINE MICROSCOPIC-ADD ON

## 2013-09-02 LAB — CBC WITH DIFFERENTIAL/PLATELET
Basophils Absolute: 0 10*3/uL (ref 0.0–0.1)
Basophils Relative: 0 % (ref 0–1)
Eosinophils Absolute: 0.1 10*3/uL (ref 0.0–0.7)
Eosinophils Relative: 1 % (ref 0–5)
HCT: 29.2 % — ABNORMAL LOW (ref 39.0–52.0)
Hemoglobin: 10.3 g/dL — ABNORMAL LOW (ref 13.0–17.0)
Lymphocytes Relative: 9 % — ABNORMAL LOW (ref 12–46)
Lymphs Abs: 1.3 10*3/uL (ref 0.7–4.0)
MCH: 31 pg (ref 26.0–34.0)
MCHC: 35.3 g/dL (ref 30.0–36.0)
MCV: 88 fL (ref 78.0–100.0)
Monocytes Absolute: 1 10*3/uL (ref 0.1–1.0)
Monocytes Relative: 6 % (ref 3–12)
Neutro Abs: 13 10*3/uL — ABNORMAL HIGH (ref 1.7–7.7)
Neutrophils Relative %: 84 % — ABNORMAL HIGH (ref 43–77)
Platelets: 154 10*3/uL (ref 150–400)
RBC: 3.32 MIL/uL — ABNORMAL LOW (ref 4.22–5.81)
RDW: 13.5 % (ref 11.5–15.5)
WBC: 15.4 10*3/uL — ABNORMAL HIGH (ref 4.0–10.5)

## 2013-09-02 MED ORDER — SODIUM CHLORIDE 0.9 % IV SOLN: 1000.0000 mL | Freq: Once | INTRAVENOUS | Status: DC

## 2013-09-02 MED ORDER — VANCOMYCIN HCL 10 G IV SOLR
1250.0000 mg | Freq: Two times a day (BID) | INTRAVENOUS | Status: DC
  Filled 2013-09-02: qty 1250

## 2013-09-02 MED ORDER — CEPHALEXIN 500 MG PO CAPS: ORAL_CAPSULE | ORAL | Status: DC

## 2013-09-02 MED ORDER — SODIUM CHLORIDE 0.9 % IV SOLN: 1000.0000 mL | INTRAVENOUS | Status: DC

## 2013-09-02 MED ORDER — SODIUM CHLORIDE 0.9 % IV SOLN
1000.0000 mL | Freq: Once | INTRAVENOUS | Status: AC
  Administered 2013-09-02: 1000 mL via INTRAVENOUS

## 2013-09-02 MED ORDER — DEXTROSE 5 % IV SOLN
1.0000 g | Freq: Once | INTRAVENOUS | Status: AC
  Administered 2013-09-02: 1 g via INTRAVENOUS
  Filled 2013-09-02: qty 10

## 2013-09-02 MED ORDER — VANCOMYCIN HCL IN DEXTROSE 1-5 GM/200ML-% IV SOLN
1000.0000 mg | Freq: Once | INTRAVENOUS | Status: AC
  Administered 2013-09-02: 1000 mg via INTRAVENOUS
  Filled 2013-09-02: qty 200

## 2013-09-02 NOTE — ED Provider Notes (Signed)
CSN: [id number]     Arrival date & time 09/02/13  0028 History   First MD Initiated Contact with Patient 09/02/13 0053     Chief Complaint  Patient presents with  . Fever   (Consider location/radiation/quality/duration/timing/severity/associated sxs/prior Treatment) HPI 77 year old male discharged within last several hours for right knee replacement, since discharge home patient has developed frequent urination without dysuria, patient also has redness warmth tenderness to his right knee incision site tonight not known to be present prior to discharge, no pus drainage from the wound, his wound is intact, he is minimal cough but no shortness of breath no chest pain no abdominal pain no vomiting no confusion no lightheadedness. He is walking with a walker and a right knee immobilizer. There is no treatment prior to arrival. Past Medical History  Diagnosis Date  . HYPERLIPIDEMIA 04/23/2007  . 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 HYPERTENSION 06/02/2008  . URI 09/01/2008  . ALLERGIC RHINITIS 04/23/2007  . RECTAL BLEEDING 02/04/2008    no recent problems  . BENIGN PROSTATIC HYPERTROPHY 04/23/2007    resolved after TURP  . OSTEOARTHRITIS 04/23/2007  . Palpitations 02/04/2008  . CHEST PAIN 02/04/2008  . NAUSEA 12/29/2008    ? med related, no current problems  . NEPHROLITHIASIS, HX OF 04/23/2007    pt denies this   . Cellulitis june 2013    left side("left flank")  . PONV (postoperative nausea and vomiting)   . H/O blurred vision     both eyes, occasional  . Neuropathy of left foot   . Kidney stone    Past Surgical History  Procedure Laterality Date  . Appendectomy    . Hernia repair    . Lithotripsy  1992    cystoscopy with stent placement also done(1 stone fragment remains)  . Right shoulder replacement  Sep 28, 2009  . Left foot surgery for fx  Jul 30, 2011  . Transurethral resection of prostate  03/27/2012    Procedure: TRANSURETHRAL RESECTION OF THE PROSTATE WITH GYRUS  INSTRUMENTS;  Surgeon: Garnett Farm, MD;  Location: [facility];  Service: Urology;;  . Total knee arthroplasty Right 08/30/2013    Procedure: RIGHT TOTAL KNEE ARTHROPLASTY RIGHT ;  Surgeon: Loanne Drilling, MD;  Location: [facility];  Service: Orthopedics;  Laterality: Right;   Family History  Problem Relation Age of Onset  . Heart disease Mother 67    MI  . Stroke Father 90  . COPD Sister   . Diabetes Sister   . Diabetes Brother    History  Substance Use Topics  . Smoking status: Never Smoker   . Smokeless tobacco: Never Used  . Alcohol Use: No    Review of Systems 10 Systems reviewed and are negative for acute change except as noted in the HPI. Allergies  Review of patient's allergies indicates no known allergies.  Home Medications   Current Outpatient Rx  Name  Route  Sig  Dispense  Refill  . benazepril-hydrochlorthiazide (LOTENSIN HCT) 20-12.5 MG per tablet   Oral   Take 1 tablet by mouth every morning.         . fluticasone (FLONASE) 50 MCG/ACT nasal spray   Each Nare   Place 1 spray into both nostrils daily.         Marland Kitchen gabapentin (NEURONTIN) 600 MG tablet   Oral   Take 600 mg by mouth 3 (three) times daily.         Marland Kitchen HYDROmorphone (DILAUDID)  2 MG tablet   Oral   Take 1-2 tablets (2-4 mg total) by mouth every 3 (three) hours as needed for severe pain.   80 tablet   0   . loratadine (CLARITIN) 10 MG tablet   Oral   Take 10 mg by mouth every evening.          . methocarbamol (ROBAXIN) 500 MG tablet   Oral   Take 1 tablet (500 mg total) by mouth every 6 (six) hours as needed for muscle spasms.   80 tablet   0   . Potassium Citrate (UROCIT-K 10 PO)   Oral   Take 2 tablets by mouth 2 (two) times daily.          . Pseudoephedrine-Guaifenesin (MUCINEX D PO)   Oral   Take 400 mg by mouth every 4 (four) hours.         . psyllium (HYDROCIL/METAMUCIL) 95 % PACK   Oral   Take 1 packet by mouth daily.         . rivaroxaban (XARELTO) 10 MG TABS  tablet   Oral   Take 1 tablet (10 mg total) by mouth daily with breakfast. Take Xarelto for two and a half more weeks, then discontinue Xarelto. Once the patient has completed the blood thinner regimen, then take a Baby 81 mg Aspirin daily for four more weeks.  May resume Celebrex at that time also.   19 tablet   0   . simvastatin (ZOCOR) 40 MG tablet   Oral   Take 40 mg by mouth every morning.         . traMADol (ULTRAM) 50 MG tablet   Oral   Take 1-2 tablets (50-100 mg total) by mouth every 6 (six) hours as needed (mild pain).   60 tablet   1   . cephALEXin (KEFLEX) 500 MG capsule      2 caps po bid x 7 days   28 capsule   0    BP 136/64  Pulse 100  Temp(Src) 100.5 F (38.1 C) (Oral)  Resp 16  SpO2 94% Physical Exam  Nursing note and vitals reviewed. Constitutional:  Awake, alert, nontoxic appearance.  HENT:  Head: Atraumatic.  Eyes: Right eye exhibits no discharge. Left eye exhibits no discharge.  Neck: Neck supple.  Cardiovascular: Normal rate and regular rhythm.   No murmur heard. Pulmonary/Chest: Effort normal and breath sounds normal. No respiratory distress. He has no wheezes. He has no rales. He exhibits no tenderness.  Abdominal: Soft. Bowel sounds are normal. He exhibits no distension and no mass. There is no tenderness. There is no rebound and no guarding.  Musculoskeletal: He exhibits tenderness.  Baseline ROM, no obvious new focal weakness arms and left leg. Right leg toes have normal light touch capillary refill less than 2 seconds good movement to the foot right knee incision intact without dehiscence or purulent drainage however patient has erythema induration tenderness warmth edema to the incision region not known to be present prior to discharge  Neurological:  Mental status and motor strength appears baseline for patient and situation.  Skin: No rash noted.  Psychiatric: He has a normal mood and affect.    ED Course  Procedures (including  critical care time) Patient / Family / Caregiver informed of clinical course, understand medical decision-making process, and agree with plan.Pt stable in ED with no significant deterioration in condition. D/w [facility] for f/u; Rx Keflex cover possible UTI and possible incision infection. Labs Review  Labs Reviewed  CBC WITH DIFFERENTIAL - Abnormal; Notable for the following:    WBC 15.4 (*)    RBC 3.32 (*)    Hemoglobin 10.3 (*)    HCT 29.2 (*)    Neutrophils Relative % 84 (*)    Neutro Abs 13.0 (*)    Lymphocytes Relative 9 (*)    All other components within normal limits  COMPREHENSIVE METABOLIC PANEL - Abnormal; Notable for the following:    Sodium 130 (*)    Chloride 94 (*)    Glucose, Bld 118 (*)    Albumin 3.3 (*)    Total Bilirubin 1.4 (*)    GFR calc non Af Amer 87 (*)    All other components within normal limits  URINALYSIS, ROUTINE W REFLEX MICROSCOPIC - Abnormal; Notable for the following:    Hgb urine dipstick SMALL (*)    Protein, ur 30 (*)    Leukocytes, UA SMALL (*)    All other components within normal limits  CULTURE, BLOOD (ROUTINE X 2)  CULTURE, BLOOD (ROUTINE X 2)  URINE CULTURE  URINE MICROSCOPIC-ADD ON  CG4 I-STAT (LACTIC ACID)   Imaging Review Dg Chest Port 1 View  09/02/2013   CLINICAL DATA:  Fever, recent knee replacement surgery  EXAM: PORTABLE CHEST - 1 VIEW  COMPARISON:  Portable exam 0113 hr compared to 08/23/2013  FINDINGS: Enlargement of cardiac silhouette with pulmonary vascular congestion.  Stable mediastinal contours.  Peribronchial thickening without infiltrate, pleural effusion or pneumothorax.  Prior right shoulder joint replacement.  Left glenohumeral and AC joint degenerative changes.  Scattered endplate spur formation thoracic spine.  IMPRESSION: Enlargement of cardiac silhouette with pulmonary vascular congestion.  Minimal bronchitic changes without infiltrate.   Electronically Signed   By: Ulyses Southward M.D.   On: 09/02/2013 01:29    EKG  Interpretation   None       MDM   1. Postoperative fever   2. Postoperative wound infection, initial encounter   3. UTI (urinary tract infection)    I doubt any other EMC precluding discharge at this time including, but not necessarily limited to the following:sepsis, septic joint.    Hurman Horn, MD 09/03/13 2202

## 2013-09-02 NOTE — Progress Notes (Signed)
Utilization review completed.  

## 2013-09-02 NOTE — Progress Notes (Signed)
ANTIBIOTIC CONSULT NOTE - INITIAL  Pharmacy Consult for Vancomcyin Indication: Sepsis  No Known Allergies  Patient Measurements: Wt=93 kg  Vital Signs: Temp: 99 F (37.2 C) (12/18 0033) Temp src: Oral (12/18 0033) BP: 164/55 mmHg (12/18 0033) Pulse Rate: 93 (12/18 0033) Intake/Output from previous day:   Intake/Output from this shift:    Labs:  Recent Labs  08/31/13 0423 09/01/13 0405  WBC 13.7* 18.0*  HGB 11.9* 11.8*  PLT 169 170  CREATININE 0.72 0.74   The CrCl is unknown because both a height and weight (above a minimum accepted value) are required for this calculation. No results found for this basename: VANCOTROUGH, Leodis Binet, VANCORANDOM, GENTTROUGH, GENTPEAK, GENTRANDOM, TOBRATROUGH, TOBRAPEAK, TOBRARND, AMIKACINPEAK, AMIKACINTROU, AMIKACIN,  in the last 72 hours   Microbiology: Recent Results (from the past 720 hour(s))  SURGICAL PCR SCREEN     Status: None   Collection Time    08/23/13  3:20 PM      Result Value Range Status   MRSA, PCR NEGATIVE  NEGATIVE Final   Staphylococcus aureus NEGATIVE  NEGATIVE Final   Comment:            The Xpert SA Assay (FDA     approved for NASAL specimens     in patients over 19 years of age),     is one component of     a comprehensive surveillance     program.  Test performance has     been validated by [employer] for patients greater     than or equal to 50 year old.     It is not intended     to diagnose infection nor to     guide or monitor treatment.    Medical History: Past Medical History  Diagnosis Date  . HYPERLIPIDEMIA 04/23/2007  . 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 HYPERTENSION 06/02/2008  . URI 09/01/2008  . ALLERGIC RHINITIS 04/23/2007  . RECTAL BLEEDING 02/04/2008    no recent problems  . BENIGN PROSTATIC HYPERTROPHY 04/23/2007    resolved after TURP  . OSTEOARTHRITIS 04/23/2007  . Palpitations 02/04/2008  . CHEST PAIN 02/04/2008  . NAUSEA 12/29/2008    ? med related, no current  problems  . NEPHROLITHIASIS, HX OF 04/23/2007    pt denies this   . Cellulitis june 2013    left side("left flank")  . PONV (postoperative nausea and vomiting)   . H/O blurred vision     both eyes, occasional  . Neuropathy of left foot   . Kidney stone     Medications:  Scheduled:   Infusions:  . sodium chloride     Followed by  . sodium chloride     Followed by  . sodium chloride     Followed by  . sodium chloride    . vancomycin    . vancomycin     Assessment: 77 yo s/p recent admission for right TKA (d/c'd 12/17) presents today with fever. Vancomycin per Rx ordered for Sepsis.  Goal of Therapy:  Vancomycin trough level 15-20 mcg/ml  Plan:   Vancomycin 1Gm x1 to ER then 1250mg  q12h.  F/U SCr/levels/cultures as needed  Susanne Greenhouse R 09/02/2013,1:21 AM

## 2013-09-02 NOTE — ED Notes (Signed)
Per patient and wife at bedside pt discharged today from [facility] after having knee replacement on Monday. Pt reports temp of 101 at 2300. Pt also reporting increased urination, every 10-15 minutes, pt did have catheter removed this am.

## 2013-09-04 ENCOUNTER — Emergency Department (HOSPITAL_COMMUNITY): Payer: Medicare Other

## 2013-09-04 ENCOUNTER — Encounter (HOSPITAL_COMMUNITY): Payer: Self-pay | Admitting: Emergency Medicine

## 2013-09-04 ENCOUNTER — Inpatient Hospital Stay (HOSPITAL_COMMUNITY)
Admission: EM | Admit: 2013-09-04 | Discharge: 2013-09-06 | DRG: 603 | Disposition: A | Discharge status: Home Health | Payer: Medicare Other | Attending: Orthopedic Surgery | Admitting: Orthopedic Surgery

## 2013-09-04 DIAGNOSIS — L0390 Cellulitis, unspecified: Secondary | ICD-10-CM

## 2013-09-04 DIAGNOSIS — Z79899 Other long term (current) drug therapy: Secondary | ICD-10-CM

## 2013-09-04 DIAGNOSIS — Z96659 Presence of unspecified artificial knee joint: Secondary | ICD-10-CM

## 2013-09-04 DIAGNOSIS — Z87442 Personal history of urinary calculi: Secondary | ICD-10-CM

## 2013-09-04 DIAGNOSIS — I10 Essential (primary) hypertension: Secondary | ICD-10-CM | POA: Diagnosis present

## 2013-09-04 DIAGNOSIS — Z96651 Presence of right artificial knee joint: Secondary | ICD-10-CM

## 2013-09-04 DIAGNOSIS — Z823 Family history of stroke: Secondary | ICD-10-CM

## 2013-09-04 DIAGNOSIS — Z8249 Family history of ischemic heart disease and other diseases of the circulatory system: Secondary | ICD-10-CM

## 2013-09-04 DIAGNOSIS — N400 Enlarged prostate without lower urinary tract symptoms: Secondary | ICD-10-CM | POA: Diagnosis present

## 2013-09-04 DIAGNOSIS — E785 Hyperlipidemia, unspecified: Secondary | ICD-10-CM | POA: Diagnosis present

## 2013-09-04 DIAGNOSIS — L02419 Cutaneous abscess of limb, unspecified: Principal | ICD-10-CM | POA: Diagnosis present

## 2013-09-04 DIAGNOSIS — M7989 Other specified soft tissue disorders: Secondary | ICD-10-CM

## 2013-09-04 DIAGNOSIS — Z833 Family history of diabetes mellitus: Secondary | ICD-10-CM

## 2013-09-04 HISTORY — DX: Presence of unspecified artificial knee joint: Z96.659

## 2013-09-04 LAB — COMPREHENSIVE METABOLIC PANEL
ALT: 69 U/L — ABNORMAL HIGH (ref 0–53)
AST: 56 U/L — ABNORMAL HIGH (ref 0–37)
Albumin: 3 g/dL — ABNORMAL LOW (ref 3.5–5.2)
Alkaline Phosphatase: 128 U/L — ABNORMAL HIGH (ref 39–117)
CO2: 25 mEq/L (ref 19–32)
Calcium: 8.9 mg/dL (ref 8.4–10.5)
Creatinine, Ser: 0.73 mg/dL (ref 0.50–1.35)
GFR calc Af Amer: >90 mL/min (ref >90)
GFR calc non Af Amer: 89 mL/min — ABNORMAL LOW (ref >90)
Glucose, Bld: 123 mg/dL — ABNORMAL HIGH (ref 70–99)
Potassium: 3.9 mEq/L (ref 3.5–5.1)
Sodium: 126 mEq/L — ABNORMAL LOW (ref 135–145)

## 2013-09-04 LAB — CBC WITH DIFFERENTIAL/PLATELET
Basophils Absolute: 0 10*3/uL (ref 0.0–0.1)
Eosinophils Relative: 2 % (ref 0–5)
Hemoglobin: 9 g/dL — ABNORMAL LOW (ref 13.0–17.0)
Lymphocytes Relative: 11 % — ABNORMAL LOW (ref 12–46)
Lymphs Abs: 1.1 10*3/uL (ref 0.7–4.0)
Neutro Abs: 7.8 10*3/uL — ABNORMAL HIGH (ref 1.7–7.7)
Neutrophils Relative %: 77 % (ref 43–77)
Platelets: 223 10*3/uL (ref 150–400)
RBC: 2.92 MIL/uL — ABNORMAL LOW (ref 4.22–5.81)
RDW: 13.4 % (ref 11.5–15.5)
WBC: 10.2 10*3/uL (ref 4.0–10.5)

## 2013-09-04 LAB — URINALYSIS, ROUTINE W REFLEX MICROSCOPIC
Bilirubin Urine: NEGATIVE
Glucose, UA: NEGATIVE mg/dL
Hgb urine dipstick: NEGATIVE
Specific Gravity, Urine: 1.017 (ref 1.005–1.030)
Urobilinogen, UA: 1 mg/dL (ref 0.0–1.0)
pH: 5.5 (ref 5.0–8.0)

## 2013-09-04 LAB — URINE CULTURE: Colony Count: 10000

## 2013-09-04 LAB — CG4 I-STAT (LACTIC ACID): Lactic Acid, Venous: 0.81 mmol/L (ref 0.5–2.2)

## 2013-09-04 LAB — URINE MICROSCOPIC-ADD ON

## 2013-09-04 MED ORDER — HYDROCHLOROTHIAZIDE 12.5 MG PO CAPS
12.5000 mg | ORAL_CAPSULE | Freq: Every day | ORAL | Status: DC
  Administered 2013-09-05 – 2013-09-06 (×2): 12.5 mg via ORAL
  Filled 2013-09-04 (×2): qty 1

## 2013-09-04 MED ORDER — DEXTROSE 5 % IV SOLN
1.0000 g | Freq: Two times a day (BID) | INTRAVENOUS | Status: DC
  Administered 2013-09-04 – 2013-09-06 (×4): 1 g via INTRAVENOUS
  Filled 2013-09-04 (×5): qty 10

## 2013-09-04 MED ORDER — TRAMADOL HCL 50 MG PO TABS: 50.0000 mg | ORAL_TABLET | Freq: Four times a day (QID) | ORAL | Status: DC | PRN

## 2013-09-04 MED ORDER — ONDANSETRON HCL 4 MG/2ML IJ SOLN: 4.0000 mg | Freq: Four times a day (QID) | INTRAMUSCULAR | Status: DC | PRN

## 2013-09-04 MED ORDER — HYDROMORPHONE HCL 2 MG PO TABS
2.0000 mg | ORAL_TABLET | ORAL | Status: DC | PRN
  Administered 2013-09-04 – 2013-09-06 (×11): 4 mg via ORAL
  Filled 2013-09-04 (×11): qty 2

## 2013-09-04 MED ORDER — SODIUM CHLORIDE 0.9 % IV BOLUS (SEPSIS)
1000.0000 mL | Freq: Once | INTRAVENOUS | Status: AC
  Administered 2013-09-04: 1000 mL via INTRAVENOUS

## 2013-09-04 MED ORDER — BISACODYL 5 MG PO TBEC: 5.0000 mg | DELAYED_RELEASE_TABLET | Freq: Every day | ORAL | Status: DC | PRN

## 2013-09-04 MED ORDER — ZOLPIDEM TARTRATE 5 MG PO TABS: 5.0000 mg | ORAL_TABLET | Freq: Every evening | ORAL | Status: DC | PRN

## 2013-09-04 MED ORDER — HYDROMORPHONE HCL PF 1 MG/ML IJ SOLN
1.0000 mg | INTRAMUSCULAR | Status: DC | PRN
Start: 2013-09-04 — End: 2013-09-06

## 2013-09-04 MED ORDER — SIMVASTATIN 40 MG PO TABS
40.0000 mg | ORAL_TABLET | Freq: Every day | ORAL | Status: DC
  Administered 2013-09-04 – 2013-09-05 (×2): 40 mg via ORAL
  Filled 2013-09-04 (×3): qty 1

## 2013-09-04 MED ORDER — TAMSULOSIN HCL 0.4 MG PO CAPS
0.4000 mg | ORAL_CAPSULE | ORAL | Status: DC
  Administered 2013-09-04 – 2013-09-05 (×2): 0.4 mg via ORAL
  Filled 2013-09-04 (×2): qty 1

## 2013-09-04 MED ORDER — SENNOSIDES-DOCUSATE SODIUM 8.6-50 MG PO TABS: 1.0000 | ORAL_TABLET | Freq: Every evening | ORAL | Status: DC | PRN

## 2013-09-04 MED ORDER — ALUM & MAG HYDROXIDE-SIMETH 200-200-20 MG/5ML PO SUSP: 30.0000 mL | Freq: Four times a day (QID) | ORAL | Status: DC | PRN

## 2013-09-04 MED ORDER — OXYCODONE HCL 5 MG PO TABS: 5.0000 mg | ORAL_TABLET | ORAL | Status: DC | PRN

## 2013-09-04 MED ORDER — METHOCARBAMOL 500 MG PO TABS
500.0000 mg | ORAL_TABLET | Freq: Four times a day (QID) | ORAL | Status: DC | PRN
  Administered 2013-09-06: 500 mg via ORAL
  Filled 2013-09-04: qty 1

## 2013-09-04 MED ORDER — ONDANSETRON HCL 4 MG PO TABS: 4.0000 mg | ORAL_TABLET | Freq: Four times a day (QID) | ORAL | Status: DC | PRN

## 2013-09-04 MED ORDER — PSYLLIUM 95 % PO PACK
1.0000 | PACK | Freq: Every day | ORAL | Status: DC
  Administered 2013-09-05 – 2013-09-06 (×2): 1 via ORAL
  Filled 2013-09-04 (×2): qty 1

## 2013-09-04 MED ORDER — BENAZEPRIL-HYDROCHLOROTHIAZIDE 20-12.5 MG PO TABS: 1.0000 | ORAL_TABLET | Freq: Every morning | ORAL | Status: DC

## 2013-09-04 MED ORDER — BENAZEPRIL HCL 20 MG PO TABS
20.0000 mg | ORAL_TABLET | Freq: Every day | ORAL | Status: DC
  Administered 2013-09-05 – 2013-09-06 (×2): 20 mg via ORAL
  Filled 2013-09-04 (×2): qty 1

## 2013-09-04 MED ORDER — VANCOMYCIN HCL IN DEXTROSE 1-5 GM/200ML-% IV SOLN
1000.0000 mg | Freq: Three times a day (TID) | INTRAVENOUS | Status: DC
  Administered 2013-09-04 – 2013-09-06 (×6): 1000 mg via INTRAVENOUS
  Filled 2013-09-04 (×7): qty 200

## 2013-09-04 MED ORDER — GABAPENTIN 300 MG PO CAPS
600.0000 mg | ORAL_CAPSULE | Freq: Three times a day (TID) | ORAL | Status: DC
  Administered 2013-09-04 – 2013-09-06 (×5): 600 mg via ORAL
  Filled 2013-09-04 (×7): qty 2

## 2013-09-04 MED ORDER — DOCUSATE SODIUM 100 MG PO CAPS
100.0000 mg | ORAL_CAPSULE | Freq: Two times a day (BID) | ORAL | Status: DC
  Administered 2013-09-04 – 2013-09-06 (×4): 100 mg via ORAL

## 2013-09-04 MED ORDER — FLUTICASONE PROPIONATE 50 MCG/ACT NA SUSP
1.0000 | Freq: Every day | NASAL | Status: DC
  Administered 2013-09-04 – 2013-09-06 (×3): 1 via NASAL
  Filled 2013-09-04: qty 16

## 2013-09-04 MED ORDER — GABAPENTIN 600 MG PO TABS
600.0000 mg | ORAL_TABLET | Freq: Three times a day (TID) | ORAL | Status: DC
  Filled 2013-09-04: qty 1

## 2013-09-04 MED ORDER — SODIUM CHLORIDE 0.45 % IV SOLN
INTRAVENOUS | Status: DC
  Administered 2013-09-04: 18:00:00 via INTRAVENOUS

## 2013-09-04 MED ORDER — POTASSIUM CHLORIDE ER 10 MEQ PO TBCR
10.0000 meq | EXTENDED_RELEASE_TABLET | Freq: Two times a day (BID) | ORAL | Status: DC
  Administered 2013-09-04 – 2013-09-06 (×4): 10 meq via ORAL
  Filled 2013-09-04 (×5): qty 1

## 2013-09-04 MED ORDER — RIVAROXABAN 10 MG PO TABS
10.0000 mg | ORAL_TABLET | Freq: Every day | ORAL | Status: DC
  Administered 2013-09-05 – 2013-09-06 (×2): 10 mg via ORAL
  Filled 2013-09-04 (×3): qty 1

## 2013-09-04 MED ORDER — LORATADINE 10 MG PO TABS
10.0000 mg | ORAL_TABLET | Freq: Every evening | ORAL | Status: DC
  Administered 2013-09-04 – 2013-09-05 (×2): 10 mg via ORAL
  Filled 2013-09-04 (×3): qty 1

## 2013-09-04 NOTE — ED Notes (Signed)
I just gave report to Florentina Addison, RN on [address] and will transport shortly.

## 2013-09-04 NOTE — Progress Notes (Signed)
VASCULAR LAB PRELIMINARY  PRELIMINARY  PRELIMINARY  PRELIMINARY  Right lower extremity venous Doppler completed.    Preliminary report:  There is no DVT or SVT noted in the right lower extremity.  There is a moderate to large sized Baker's cyst noted in the right popliteal fossa.  Shantice Menger, RVT 09/04/2013, 5:42 PM

## 2013-09-04 NOTE — ED Notes (Signed)
Patient is from home accompanied by wife and daughter. Patient was seen in home by physical therapy today which she was concerned with increased swelling and redness in right knee. Patient has knee replacement on Monday. Patient was D/C Wed and came back to ER on Wed for fever and pain. Was sent home on antibiotics. Today patients states pain is exteremly bad and his leg is painful to touch.

## 2013-09-04 NOTE — H&P (Addendum)
[name] [name] is an 77 y.o. male.   Chief Complaint: right knee pain HPI: Pt s/p R TKA by Dr. Lequita Halt on Monday 12/15. He was D/C'd home 2 days post-op on 12/17 and actually returned to the ER on 12/18 c/o erythema, swelling. He was given Rx for Keflex. Reports worsening erythema, swelling, as well as new onset fever and shaking chills despite taking Keflex. He has been taking Dilaudid for pain at home. He reports he has attempted to ice and elevate although due to his severe pain he is having trouble getting in a comfortable position to do so. He reports calf pain as well. He is here in the ER with his wife and daughter. They have shown me pictures of his leg from yesterday, and his erythema has worsened. He also reports new erythema in the right groin/proximal thigh region. His entire right leg is exquisitely tender. He also is c/o urinary frequency since surgery after his foley was removed. He has had prior prostate surgery as well as voiding issues in the past and follows with Dr. Vernie Ammons.  Past Medical History  Diagnosis Date  . HYPERLIPIDEMIA 04/23/2007  . 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 HYPERTENSION 06/02/2008  . URI 09/01/2008  . ALLERGIC RHINITIS 04/23/2007  . RECTAL BLEEDING 02/04/2008    no recent problems  . BENIGN PROSTATIC HYPERTROPHY 04/23/2007    resolved after TURP  . OSTEOARTHRITIS 04/23/2007  . Palpitations 02/04/2008  . CHEST PAIN 02/04/2008  . NAUSEA 12/29/2008    ? med related, no current problems  . NEPHROLITHIASIS, HX OF 04/23/2007    pt denies this   . Cellulitis june 2013    left side("left flank")  . PONV (postoperative nausea and vomiting)   . H/O blurred vision     both eyes, occasional  . Neuropathy of left foot   . Kidney stone     Past Surgical History  Procedure Laterality Date  . Appendectomy    . Hernia repair    . Lithotripsy  1992    cystoscopy with stent placement also done(1 stone fragment remains)  . Right shoulder replacement   Sep 28, 2009  . Left foot surgery for fx  Jul 30, 2011  . Transurethral resection of prostate  03/27/2012    Procedure: TRANSURETHRAL RESECTION OF THE PROSTATE WITH GYRUS INSTRUMENTS;  Surgeon: Garnett Farm, MD;  Location: [facility];  Service: Urology;;  . Total knee arthroplasty Right 08/30/2013    Procedure: RIGHT TOTAL KNEE ARTHROPLASTY RIGHT ;  Surgeon: Loanne Drilling, MD;  Location: [facility];  Service: Orthopedics;  Laterality: Right;    Family History  Problem Relation Age of Onset  . Heart disease Mother 71    MI  . Stroke Father 53  . COPD Sister   . Diabetes Sister   . Diabetes Brother    Social History:  reports that he has never smoked. He has never used smokeless tobacco. He reports that he does not drink alcohol or use illicit drugs.  Allergies: No Known Allergies   (Not in a hospital admission)  Results for orders placed during the hospital encounter of 09/04/13 (from the past 48 hour(s))  CBC WITH DIFFERENTIAL     Status: Abnormal   Collection Time    09/04/13  2:00 PM      Result Value Range   WBC 10.2  4.0 - 10.5 K/uL   RBC 2.92 (*) 4.22 - 5.81 MIL/uL   Hemoglobin 9.0 (*)  13.0 - 17.0 g/dL   HCT 16.1 (*) 09.6 - 04.5 %   MCV 87.7  78.0 - 100.0 fL   MCH 30.8  26.0 - 34.0 pg   MCHC 35.2  30.0 - 36.0 g/dL   RDW 40.9  81.1 - 91.4 %   Platelets 223  150 - 400 K/uL   Neutrophils Relative % 77  43 - 77 %   Neutro Abs 7.8 (*) 1.7 - 7.7 K/uL   Lymphocytes Relative 11 (*) 12 - 46 %   Lymphs Abs 1.1  0.7 - 4.0 K/uL   Monocytes Relative 11  3 - 12 %   Monocytes Absolute 1.1 (*) 0.1 - 1.0 K/uL   Eosinophils Relative 2  0 - 5 %   Eosinophils Absolute 0.2  0.0 - 0.7 K/uL   Basophils Relative 0  0 - 1 %   Basophils Absolute 0.0  0.0 - 0.1 K/uL  COMPREHENSIVE METABOLIC PANEL     Status: Abnormal   Collection Time    09/04/13  2:00 PM      Result Value Range   Sodium 126 (*) 135 - 145 mEq/L   Potassium 3.9  3.5 - 5.1 mEq/L   Chloride 88 (*) 96 - 112 mEq/L   CO2 25  19  - 32 mEq/L   Glucose, Bld 123 (*) 70 - 99 mg/dL   BUN 21  6 - 23 mg/dL   Creatinine, Ser 7.82  0.50 - 1.35 mg/dL   Calcium 8.9  8.4 - 95.6 mg/dL   Total Protein 6.7  6.0 - 8.3 g/dL   Albumin 3.0 (*) 3.5 - 5.2 g/dL   AST 56 (*) 0 - 37 U/L   ALT 69 (*) 0 - 53 U/L   Alkaline Phosphatase 128 (*) 39 - 117 U/L   Total Bilirubin 1.3 (*) 0.3 - 1.2 mg/dL   GFR calc non Af Amer 89 (*) >90 mL/min   GFR calc Af Amer >90  >90 mL/min   Comment: (NOTE)     The eGFR has been calculated using the CKD EPI equation.     This calculation has not been validated in all clinical situations.     eGFR's persistently <90 mL/min signify possible Chronic Kidney     Disease.  URINALYSIS, ROUTINE W REFLEX MICROSCOPIC     Status: Abnormal   Collection Time    09/04/13  2:01 PM      Result Value Range   Color, Urine YELLOW  YELLOW   APPearance CLEAR  CLEAR   Specific Gravity, Urine 1.017  1.005 - 1.030   pH 5.5  5.0 - 8.0   Glucose, UA NEGATIVE  NEGATIVE mg/dL   Hgb urine dipstick NEGATIVE  NEGATIVE   Bilirubin Urine NEGATIVE  NEGATIVE   Ketones, ur NEGATIVE  NEGATIVE mg/dL   Protein, ur NEGATIVE  NEGATIVE mg/dL   Urobilinogen, UA 1.0  0.0 - 1.0 mg/dL   Nitrite NEGATIVE  NEGATIVE   Leukocytes, UA SMALL (*) NEGATIVE  URINE MICROSCOPIC-ADD ON     Status: Abnormal   Collection Time    09/04/13  2:01 PM      Result Value Range   WBC, UA 0-2  <3 WBC/hpf   Bacteria, UA RARE  RARE   Casts HYALINE CASTS (*) NEGATIVE  CG4 I-STAT (LACTIC ACID)     Status: None   Collection Time    09/04/13  2:06 PM      Result Value Range   Lactic Acid, Venous  0.81  0.5 - 2.2 mmol/L   Dg Knee Right Port  09/04/2013   CLINICAL DATA:  History of right total knee joint replacement on 30 August 2013.  EXAM: PORTABLE RIGHT KNEE - 1-2 VIEW  COMPARISON:  None.  FINDINGS: The knee prosthesis exhibits normal positioning. The interface between the native bone and the prosthesis appears good. There is mild soft tissue swelling  diffusely. There are small amounts of soft tissue gas. No abnormal soft tissue calcifications are demonstrated.  IMPRESSION: The knee joint prosthesis and adjacent native bone appear normal. There is soft tissue swelling however. A small amounts of soft tissue gas are present which may be residual from the surgery. Infection is not excluded.  Orthopedic evaluation is recommended.   Electronically Signed   By: David  [place]   On: 09/04/2013 14:59    Review of Systems  Constitutional: Positive for fever and chills.  HENT: Negative.   Eyes: Negative.   Respiratory: Negative.   Cardiovascular: Negative.   Gastrointestinal: Negative.   Genitourinary: Positive for frequency.  Musculoskeletal: Positive for joint pain.  Skin: Negative.   Neurological: Negative.   Psychiatric/Behavioral: Negative.     Blood pressure 139/54, pulse 88, temperature 98.6 F (37 C), temperature source Oral, resp. rate 17, SpO2 98.00%. Physical Exam  Constitutional: He is oriented to person, place, and time. He appears well-developed and well-nourished. He appears distressed.  HENT:  Head: Normocephalic and atraumatic.  Eyes: Conjunctivae and EOM are normal. Pupils are equal, round, and reactive to light.  Neck: Normal range of motion. Neck supple.  Cardiovascular: Normal rate and regular rhythm.   Respiratory: Effort normal and breath sounds normal.  GI: Soft. Bowel sounds are normal.  Musculoskeletal:  Right knee, calf, thigh diffusely exquisitely tender. Pedal pulses intact. Erythema as noted R knee and proximal thigh/groin with increased warmth. R knee with steri strips in place, there does appear to be some blistering at the border of the steris  Neurological: He is alert and oriented to person, place, and time. He has normal reflexes.  Skin: Skin is warm. There is erythema.  Erythema noted right anterior knee at TKA incision site as well as R proximal thigh and groin at tourniquet site.  Psychiatric: He has a  normal mood and affect.    xrays R knee AP/lat with TKA prosthesis in excellent position, no loosening or osteolysis, no fx. Good cement mantle noted  Assessment/Plan 5 days s/p R TKA with erythema and swelling, cellulitis  Will admit to Dr. Deri Fuelling service for IV abx and pain control Pt to be seen by Dr. Lequita Halt later this afternoon. Will hold IV abx until he is seen for possible aspiration of the knee to be sent for cx Doppler already ordered to r/o DVT, pending Recommend ice and elevation for swelling Also c/o urinary frequency- consider urology consult, he is an established pt of Dr. Vernie Ammons Discussed with Dr. Roanna Raider, Dayna Barker. 09/04/2013, 3:24 PM  I have seen and examined the patient and agree with the above assessment. He does not have a knee effusion and thus I will not attempt aspiration. The knee itself does not appear infected but he does have an area of possible cellulitis proximal medial thigh. His WBC is now normal which is encouraging and suggests that there is not a systemic infection. He does have significant soft tissue swelling in the entire leg and thus the doppler was ordered. I will admit for IV antibiotics and observation. Anticipate this should improve  relatively quickly.

## 2013-09-04 NOTE — ED Provider Notes (Signed)
CSN: [id number]     Arrival date & time 09/04/13  1210 History   First MD Initiated Contact with Patient 09/04/13 1259     Chief Complaint  Patient presents with  . Knee Pain   (Consider location/radiation/quality/duration/timing/severity/associated sxs/prior Treatment) The history is provided by the patient. No language interpreter was used.  [name] [name] is a 77 y/o M with PMhx of HLD, HTN, BPH with TURP in 2013, kidney stones, with recent right TKR on 08/30/2013 performed by Dr. Despina Hick, presenting to the ED with swelling, redness, increased pain, and muscle spasms to the right leg. Patient reported that the pain is constant to the right leg, described as sharp shooting intermittent pains with a constant aching, throbbing sensation. Wife reported that the foot and leg have increased in size over the course of 24 hours. Reported that the inner thigh of the right leg, medial aspect, started to get red and hot to the touch. Wife reported that patient has been having fevers - temperature going up and down. Wife reported that patient has had 2 and a half days of Keflex. Reported that that pain worsens with motion. Patient was recently at PT and instructed to come to the ED secondary to findings and appearance of the leg. Wife reported that Dr. Despina Hick was called and notified about the situation. Denied numbness, tingling, drainage, loss of sensation, chest pain, shortness of breath, difficulty breathing, abdominal pain, vomiting, diarrhea.  PCP Dr. Frederica Kuster   Past Medical History  Diagnosis Date  . HYPERLIPIDEMIA 04/23/2007  . 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 HYPERTENSION 06/02/2008  . URI 09/01/2008  . ALLERGIC RHINITIS 04/23/2007  . RECTAL BLEEDING 02/04/2008    no recent problems  . BENIGN PROSTATIC HYPERTROPHY 04/23/2007    resolved after TURP  . OSTEOARTHRITIS 04/23/2007  . Palpitations 02/04/2008  . CHEST PAIN 02/04/2008  . NAUSEA 12/29/2008    ? med related, no current  problems  . NEPHROLITHIASIS, HX OF 04/23/2007    pt denies this   . Cellulitis june 2013    left side("left flank")  . PONV (postoperative nausea and vomiting)   . H/O blurred vision     both eyes, occasional  . Neuropathy of left foot   . Kidney stone    Past Surgical History  Procedure Laterality Date  . Appendectomy    . Hernia repair    . Lithotripsy  1992    cystoscopy with stent placement also done(1 stone fragment remains)  . Right shoulder replacement  Sep 28, 2009  . Left foot surgery for fx  Jul 30, 2011  . Transurethral resection of prostate  03/27/2012    Procedure: TRANSURETHRAL RESECTION OF THE PROSTATE WITH GYRUS INSTRUMENTS;  Surgeon: Garnett Farm, MD;  Location: [facility];  Service: Urology;;  . Total knee arthroplasty Right 08/30/2013    Procedure: RIGHT TOTAL KNEE ARTHROPLASTY RIGHT ;  Surgeon: Loanne Drilling, MD;  Location: [facility];  Service: Orthopedics;  Laterality: Right;   Family History  Problem Relation Age of Onset  . Heart disease Mother 61    MI  . Stroke Father 51  . COPD Sister   . Diabetes Sister   . Diabetes Brother    History  Substance Use Topics  . Smoking status: Never Smoker   . Smokeless tobacco: Never Used  . Alcohol Use: No    Review of Systems  Constitutional: Positive for fever and chills.  Respiratory: Negative for chest tightness  and shortness of breath.   Cardiovascular: Positive for leg swelling. Negative for chest pain.  Gastrointestinal: Positive for nausea. Negative for vomiting, abdominal pain and diarrhea.  Musculoskeletal: Positive for arthralgias and joint swelling.  Neurological: Negative for dizziness and weakness.  All other systems reviewed and are negative.    Allergies  Review of patient's allergies indicates no known allergies.  Home Medications   Current Outpatient Rx  Name  Route  Sig  Dispense  Refill  . benazepril-hydrochlorthiazide (LOTENSIN HCT) 20-12.5 MG per tablet   Oral   Take 1 tablet by  mouth every morning.         . cephALEXin (KEFLEX) 500 MG capsule   Oral   Take 1,000 mg by mouth 2 (two) times daily. For 7 days         . fluticasone (FLONASE) 50 MCG/ACT nasal spray   Each Nare   Place 1 spray into both nostrils daily.         Marland Kitchen gabapentin (NEURONTIN) 600 MG tablet   Oral   Take 600 mg by mouth 3 (three) times daily.         Marland Kitchen HYDROmorphone (DILAUDID) 2 MG tablet   Oral   Take 1-2 tablets (2-4 mg total) by mouth every 3 (three) hours as needed for severe pain.   80 tablet   0   . loratadine (CLARITIN) 10 MG tablet   Oral   Take 10 mg by mouth every evening.          . methocarbamol (ROBAXIN) 500 MG tablet   Oral   Take 1 tablet (500 mg total) by mouth every 6 (six) hours as needed for muscle spasms.   80 tablet   0   . potassium chloride (K-DUR) 10 MEQ tablet   Oral   Take 10 mEq by mouth 2 (two) times daily.         . psyllium (HYDROCIL/METAMUCIL) 95 % PACK   Oral   Take 1 packet by mouth daily.         . rivaroxaban (XARELTO) 10 MG TABS tablet   Oral   Take 1 tablet (10 mg total) by mouth daily with breakfast. Take Xarelto for two and a half more weeks, then discontinue Xarelto. Once the patient has completed the blood thinner regimen, then take a Baby 81 mg Aspirin daily for four more weeks.  May resume Celebrex at that time also.   19 tablet   0   . simvastatin (ZOCOR) 40 MG tablet   Oral   Take 40 mg by mouth at bedtime.          . traMADol (ULTRAM) 50 MG tablet   Oral   Take 1-2 tablets (50-100 mg total) by mouth every 6 (six) hours as needed (mild pain).   60 tablet   1    BP 124/45  Pulse 90  Temp(Src) 100.4 F (38 C) (Oral)  Resp 18  SpO2 96% Physical Exam  Nursing note and vitals reviewed. Constitutional: He is oriented to person, place, and time. He appears well-developed and well-nourished. No distress.  HENT:  Head: Normocephalic and atraumatic.  Eyes: Conjunctivae and EOM are normal. Pupils are equal,  round, and reactive to light. Right eye exhibits no discharge. Left eye exhibits no discharge.  Neck: Normal range of motion. Neck supple.  Cardiovascular: Normal rate, regular rhythm and normal heart sounds.  Exam reveals no friction rub.   No murmur heard. Pulses:  Radial pulses are 2+ on the right side, and 2+ on the left side.       Dorsalis pedis pulses are 2+ on the right side, and 2+ on the left side.  Pulmonary/Chest: Effort normal and breath sounds normal. No respiratory distress. He has no wheezes. He has no rales.  Musculoskeletal: He exhibits tenderness.  Swelling localize to the entire right leg - from thigh to foot. Warmth upon palpation to the right leg when compared to the left. Incision site, measuring approximate 5 inches, linear intact with negative bleeding, dehiscence, pus drainage - surrounding erythema noted. Warmth upon palpation to the right knee circumferential. Pain upon palpation anywhere on the right leg, patient jumps up. Decreased ROM to the right knee secondary to pain. Full ROM to the right ankle and digits of the right foot.   Lymphadenopathy:    He has no cervical adenopathy.  Neurological: He is alert and oriented to person, place, and time. He exhibits normal muscle tone. Coordination normal.  Strength intact to the digits of the right foot Sensation intact with differentiation to sharp and dull touch to RLE  Skin: Skin is warm. He is not diaphoretic. There is erythema.  Please see Musculoskeletal  Psychiatric: He has a normal mood and affect. His behavior is normal. Thought content normal.    ED Course  Procedures (including critical care time)  2:26 PM This provider spoke with Dr. Despina Hick, Orthopedic Surgeon, discussed case, presentation, labs. Physician recommended doppler to be performed to rule out possible DVT. Reported that Annice Pih, [place], will be in to see and assess the patient for admission.   Results for orders placed during the hospital  encounter of 09/04/13  CBC WITH DIFFERENTIAL      Result Value Range   WBC 10.2  4.0 - 10.5 K/uL   RBC 2.92 (*) 4.22 - 5.81 MIL/uL   Hemoglobin 9.0 (*) 13.0 - 17.0 g/dL   HCT 16.1 (*) 09.6 - 04.5 %   MCV 87.7  78.0 - 100.0 fL   MCH 30.8  26.0 - 34.0 pg   MCHC 35.2  30.0 - 36.0 g/dL   RDW 40.9  81.1 - 91.4 %   Platelets 223  150 - 400 K/uL   Neutrophils Relative % 77  43 - 77 %   Neutro Abs 7.8 (*) 1.7 - 7.7 K/uL   Lymphocytes Relative 11 (*) 12 - 46 %   Lymphs Abs 1.1  0.7 - 4.0 K/uL   Monocytes Relative 11  3 - 12 %   Monocytes Absolute 1.1 (*) 0.1 - 1.0 K/uL   Eosinophils Relative 2  0 - 5 %   Eosinophils Absolute 0.2  0.0 - 0.7 K/uL   Basophils Relative 0  0 - 1 %   Basophils Absolute 0.0  0.0 - 0.1 K/uL  COMPREHENSIVE METABOLIC PANEL      Result Value Range   Sodium 126 (*) 135 - 145 mEq/L   Potassium 3.9  3.5 - 5.1 mEq/L   Chloride 88 (*) 96 - 112 mEq/L   CO2 25  19 - 32 mEq/L   Glucose, Bld 123 (*) 70 - 99 mg/dL   BUN 21  6 - 23 mg/dL   Creatinine, Ser 7.82  0.50 - 1.35 mg/dL   Calcium 8.9  8.4 - 95.6 mg/dL   Total Protein 6.7  6.0 - 8.3 g/dL   Albumin 3.0 (*) 3.5 - 5.2 g/dL   AST 56 (*) 0 - 37 U/L  ALT 69 (*) 0 - 53 U/L   Alkaline Phosphatase 128 (*) 39 - 117 U/L   Total Bilirubin 1.3 (*) 0.3 - 1.2 mg/dL   GFR calc non Af Amer 89 (*) >90 mL/min   GFR calc Af Amer >90  >90 mL/min  URINALYSIS, ROUTINE W REFLEX MICROSCOPIC      Result Value Range   Color, Urine YELLOW  YELLOW   APPearance CLEAR  CLEAR   Specific Gravity, Urine 1.017  1.005 - 1.030   pH 5.5  5.0 - 8.0   Glucose, UA NEGATIVE  NEGATIVE mg/dL   Hgb urine dipstick NEGATIVE  NEGATIVE   Bilirubin Urine NEGATIVE  NEGATIVE   Ketones, ur NEGATIVE  NEGATIVE mg/dL   Protein, ur NEGATIVE  NEGATIVE mg/dL   Urobilinogen, UA 1.0  0.0 - 1.0 mg/dL   Nitrite NEGATIVE  NEGATIVE   Leukocytes, UA SMALL (*) NEGATIVE  URINE MICROSCOPIC-ADD ON      Result Value Range   WBC, UA 0-2  <3 WBC/hpf   Bacteria, UA RARE   RARE   Casts HYALINE CASTS (*) NEGATIVE  CG4 I-STAT (LACTIC ACID)      Result Value Range   Lactic Acid, Venous 0.81  0.5 - 2.2 mmol/L   Dg Chest 2 View  08/23/2013   CLINICAL DATA:  Preop for total knee replacement  EXAM: CHEST  2 VIEW  COMPARISON:  March 23, 2012  FINDINGS: Heart size and vascular pattern are normal. Lungs are clear. No pleural effusions. Bony proliferation left humeral head/neck, stable. Right shoulder replacement stable.  IMPRESSION: No acute findings.   Electronically Signed   By: Esperanza Heir M.D.   On: 08/23/2013 16:17   Dg Chest Port 1 View  09/02/2013   CLINICAL DATA:  Fever, recent knee replacement surgery  EXAM: PORTABLE CHEST - 1 VIEW  COMPARISON:  Portable exam 0113 hr compared to 08/23/2013  FINDINGS: Enlargement of cardiac silhouette with pulmonary vascular congestion.  Stable mediastinal contours.  Peribronchial thickening without infiltrate, pleural effusion or pneumothorax.  Prior right shoulder joint replacement.  Left glenohumeral and AC joint degenerative changes.  Scattered endplate spur formation thoracic spine.  IMPRESSION: Enlargement of cardiac silhouette with pulmonary vascular congestion.  Minimal bronchitic changes without infiltrate.   Electronically Signed   By: Ulyses Southward M.D.   On: 09/02/2013 01:29   Dg Knee Right Port  09/04/2013   CLINICAL DATA:  History of right total knee joint replacement on 30 August 2013.  EXAM: PORTABLE RIGHT KNEE - 1-2 VIEW  COMPARISON:  None.  FINDINGS: The knee prosthesis exhibits normal positioning. The interface between the native bone and the prosthesis appears good. There is mild soft tissue swelling diffusely. There are small amounts of soft tissue gas. No abnormal soft tissue calcifications are demonstrated.  IMPRESSION: The knee joint prosthesis and adjacent native bone appear normal. There is soft tissue swelling however. A small amounts of soft tissue gas are present which may be residual from the surgery.  Infection is not excluded.  Orthopedic evaluation is recommended.   Electronically Signed   By: David  [place]   On: 09/04/2013 14:59    Labs Review Labs Reviewed  CBC WITH DIFFERENTIAL - Abnormal; Notable for the following:    RBC 2.92 (*)    Hemoglobin 9.0 (*)    HCT 25.6 (*)    Neutro Abs 7.8 (*)    Lymphocytes Relative 11 (*)    Monocytes Absolute 1.1 (*)    All other components  within normal limits  COMPREHENSIVE METABOLIC PANEL - Abnormal; Notable for the following:    Sodium 126 (*)    Chloride 88 (*)    Glucose, Bld 123 (*)    Albumin 3.0 (*)    AST 56 (*)    ALT 69 (*)    Alkaline Phosphatase 128 (*)    Total Bilirubin 1.3 (*)    GFR calc non Af Amer 89 (*)    All other components within normal limits  URINALYSIS, ROUTINE W REFLEX MICROSCOPIC - Abnormal; Notable for the following:    Leukocytes, UA SMALL (*)    All other components within normal limits  URINE MICROSCOPIC-ADD ON - Abnormal; Notable for the following:    Casts HYALINE CASTS (*)    All other components within normal limits  CULTURE, BLOOD (ROUTINE X 2)  CULTURE, BLOOD (ROUTINE X 2)  CG4 I-STAT (LACTIC ACID)   Imaging Review Dg Knee Right Port  09/04/2013   CLINICAL DATA:  History of right total knee joint replacement on 30 August 2013.  EXAM: PORTABLE RIGHT KNEE - 1-2 VIEW  COMPARISON:  None.  FINDINGS: The knee prosthesis exhibits normal positioning. The interface between the native bone and the prosthesis appears good. There is mild soft tissue swelling diffusely. There are small amounts of soft tissue gas. No abnormal soft tissue calcifications are demonstrated.  IMPRESSION: The knee joint prosthesis and adjacent native bone appear normal. There is soft tissue swelling however. A small amounts of soft tissue gas are present which may be residual from the surgery. Infection is not excluded.  Orthopedic evaluation is recommended.   Electronically Signed   By: David  [place]   On: 09/04/2013 14:59    EKG  Interpretation   None       MDM   1. Cellulitis   2. S/P total knee arthroplasty, right     Medications  traMADol (ULTRAM) tablet 50-100 mg (not administered)  methocarbamol (ROBAXIN) tablet 500 mg (not administered)  HYDROmorphone (DILAUDID) tablet 2-4 mg (4 mg Oral Given 09/04/13 1545)  oxyCODONE (Oxy IR/ROXICODONE) immediate release tablet 5 mg (not administered)  HYDROmorphone (DILAUDID) injection 1 mg (not administered)  ondansetron (ZOFRAN) tablet 4 mg (not administered)    Or  ondansetron (ZOFRAN) injection 4 mg (not administered)  sodium chloride 0.9 % bolus 1,000 mL (0 mLs Intravenous Stopped 09/04/13 1515)   Filed Vitals:   09/04/13 1223 09/04/13 1533  BP: 139/54 124/45  Pulse: 88 90  Temp: 98.6 F (37 C) 100.4 F (38 C)  TempSrc: Oral Oral  Resp: 17 18  SpO2: 98% 96%    Patient presenting to the ED with recent TKR to the right knee on 08/30/2013 by Dr. Despina Hick, with increased pain, swelling, erythema, and warmth to the leg. Described pain as a sharp shooting pain that is intermittent with constant throbbing, aching sensation.  Alert and oriented. GCS 15. Heart rate and rhythm normal. Lungs clear to auscultation to upper and lower lobes. Swelling localized to the entire right leg. Warmth upon palpation to the entire leg when compared to the left. Erythema noted to the medial aspect of the right leg, upper thigh region. Incision site identified - negative dehiscence, negative drainage, negative bleeding - surrounding erythema noted. Limited ROM to the right knee secondary to pain. Swelling noted to the right foot - full ROM noted to the digits of the right foot. Strength intact to the digits of the right foot. DP pulses palpable, 2+ bilaterally.  CBC negative elevation  in WBC noted - negative left shift. CMP noted hyponatremia of 126. Elevated transaminases - AST 56, ALT 69, alk phos 128, bilirubin total 1.3. Urine negative for infection, small leukocytes noted, hyaline  casts identified. Right knee noted knee joint prosthesis with normal positioning, mild soft tissue swelling diffusely identified. Small amounts of soft tissue gas identified. Cultures pending.  This provider spoke with Dr. Despina Hick, orthopedic surgeon. Recommended doppler to be performed to rule out DVT, results pending. Reported that Rose Fillers, will come and assess patient and place orders.  Cellulitis noted to right leg, need to rule out DVT - doppler ordered and pending. Patient to be admitted under Dr. Despina Hick for IV pain medications and antibiotics. Patient admitted as inpatient to MedSurg.  Raymon Mutton, PA-C 09/04/13 1713

## 2013-09-04 NOTE — Progress Notes (Signed)
ANTIBIOTIC CONSULT NOTE - INITIAL  Pharmacy Consult for vancomycin Indication: post op cellulitis  No Known Allergies  Patient Measurements: Height: 5' 10.08" (178 cm) Weight: 205 lb 0.4 oz (93 kg) IBW/kg (Calculated) : 73.18  Vital Signs: Temp: 100.2 F (37.9 C) (12/20 1645) Temp src: Oral (12/20 1645) BP: 125/61 mmHg (12/20 1645) Pulse Rate: 85 (12/20 1645) Intake/Output from previous day:   Intake/Output from this shift:    Labs:  Recent Labs  09/02/13 0120 09/04/13 1400  WBC 15.4* 10.2  HGB 10.3* 9.0*  PLT 154 223  CREATININE 0.78 0.73   Estimated Creatinine Clearance: 92.9 ml/min (by C-G formula based on Cr of 0.73). No results found for this basename: VANCOTROUGH, Leodis Binet, VANCORANDOM, GENTTROUGH, GENTPEAK, GENTRANDOM, TOBRATROUGH, TOBRAPEAK, TOBRARND, AMIKACINPEAK, AMIKACINTROU, AMIKACIN,  in the last 72 hours   Microbiology: Recent Results (from the past 720 hour(s))  SURGICAL PCR SCREEN     Status: None   Collection Time    08/23/13  3:20 PM      Result Value Range Status   MRSA, PCR NEGATIVE  NEGATIVE Final   Staphylococcus aureus NEGATIVE  NEGATIVE Final   Comment:            The Xpert SA Assay (FDA     approved for NASAL specimens     in patients over 40 years of age),     is one component of     a comprehensive surveillance     program.  Test performance has     been validated by [employer] for patients greater     than or equal to 7 year old.     It is not intended     to diagnose infection nor to     guide or monitor treatment.  CULTURE, BLOOD (ROUTINE X 2)     Status: None   Collection Time    09/02/13  1:20 AM      Result Value Range Status   Specimen Description BLOOD RIGHT ANTECUBITAL   Final   Special Requests BOTTLES DRAWN AEROBIC AND ANAEROBIC 10CC   Final   Culture  Setup Time     Final   Value: 09/02/2013 09:10     Performed at [facility]   Culture     Final   Value:        BLOOD CULTURE RECEIVED NO GROWTH  TO DATE CULTURE WILL BE HELD FOR 5 DAYS BEFORE ISSUING A FINAL NEGATIVE REPORT     Performed at [facility]   Report Status PENDING   Incomplete  CULTURE, BLOOD (ROUTINE X 2)     Status: None   Collection Time    09/02/13  1:25 AM      Result Value Range Status   Specimen Description BLOOD LEFT ANTECUBITAL   Final   Special Requests BOTTLES DRAWN AEROBIC AND ANAEROBIC 5CC   Final   Culture  Setup Time     Final   Value: 09/02/2013 09:10     Performed at [facility]   Culture     Final   Value:        BLOOD CULTURE RECEIVED NO GROWTH TO DATE CULTURE WILL BE HELD FOR 5 DAYS BEFORE ISSUING A FINAL NEGATIVE REPORT     Performed at [facility]   Report Status PENDING   Incomplete  URINE CULTURE     Status: None   Collection Time    09/02/13  2:16 AM  Result Value Range Status   Specimen Description URINE, CLEAN CATCH   Final   Special Requests NONE   Final   Culture  Setup Time     Final   Value: 09/02/2013 09:09     Performed at [facility] Count     Final   Value: 10,000 COLONIES/ML     Performed at [facility]   Culture     Final   Value: ENTEROBACTER CLOACAE     Performed at [facility]   Report Status 09/04/2013 FINAL   Final   Organism ID, Bacteria ENTEROBACTER CLOACAE   Final    Medical History: Past Medical History  Diagnosis Date  . HYPERLIPIDEMIA 04/23/2007  . 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 HYPERTENSION 06/02/2008  . URI 09/01/2008  . ALLERGIC RHINITIS 04/23/2007  . RECTAL BLEEDING 02/04/2008    no recent problems  . BENIGN PROSTATIC HYPERTROPHY 04/23/2007    resolved after TURP  . OSTEOARTHRITIS 04/23/2007  . Palpitations 02/04/2008  . CHEST PAIN 02/04/2008  . NAUSEA 12/29/2008    ? med related, no current problems  . NEPHROLITHIASIS, HX OF 04/23/2007    pt denies this   . Cellulitis june 2013    left side("left flank")  . PONV (postoperative nausea and vomiting)   . H/O blurred  vision     both eyes, occasional  . Neuropathy of left foot   . Kidney stone     Medications:  Scheduled:  . cefTRIAXone (ROCEPHIN)  IV  1 g Intravenous Q12H  . sodium chloride  1,000 mL Intravenous Once  . tamsulosin  0.4 mg Oral PC supper   Infusions:    Assessment: 77 yo male s/p R TKA on 12/15 discharged on 12/17 presented to ER on 12/18 with erythema and swelling sent home with Keflex. Now presents back to ER with worsening erythema, swelling and fever to start vancomycin per pharmacy dosing in addition to Rocephin already ordered. Stable Scr and WBC. Fever of 100.4  Goal of Therapy:  Vancomycin trough level 15-20 mcg/ml  Plan:  1) Vancomycin 1g IV q8 2) trough at steady state   Hessie Knows, PharmD, BCPS Pager [phone] 09/04/2013 5:18 PM

## 2013-09-04 NOTE — ED Notes (Signed)
He tells me he underwent total right knee surgery this Mon.  He returned very early Thurs. For pain/fever; at which time he states he received IV antibiotics here and was prescribed Keflex p.o. Which he is still taking.  He is here today with c/o persistent right knee pain/swelling/redness.  He also c/o new patch of erythema at proximal post. Right thigh.  Dr. Lequita Halt has been paged to inform him of pt's. Arrival.  He is oriented x 4 and in no distress.

## 2013-09-05 LAB — BASIC METABOLIC PANEL
BUN: 18 mg/dL (ref 6–23)
CO2: 26 mEq/L (ref 19–32)
Chloride: 96 mEq/L (ref 96–112)
Creatinine, Ser: 0.74 mg/dL (ref 0.50–1.35)
GFR calc Af Amer: >90 mL/min (ref >90)
GFR calc non Af Amer: 89 mL/min — ABNORMAL LOW (ref >90)
Potassium: 4 mEq/L (ref 3.5–5.1)

## 2013-09-05 LAB — CBC
HCT: 24.1 % — ABNORMAL LOW (ref 39.0–52.0)
Hemoglobin: 8.4 g/dL — ABNORMAL LOW (ref 13.0–17.0)
MCHC: 34.9 g/dL (ref 30.0–36.0)
MCV: 88.3 fL (ref 78.0–100.0)
RDW: 13.6 % (ref 11.5–15.5)
WBC: 9.6 10*3/uL (ref 4.0–10.5)

## 2013-09-05 NOTE — Progress Notes (Signed)
Subjective:     Patient reports pain as 3 on 0-10 scale.Cellulitis of thigh and knee. No signs of an abscess formation. Temp99.4. Hbg 8.4    Objective: Vital signs in last 24 hours: Temp:  [98.6 F (37 C)-100.4 F (38 C)] 99.4 F (37.4 C) (12/21 0746) Pulse Rate:  [84-90] 85 (12/21 0520) Resp:  [16-20] 20 (12/21 0520) BP: (124-146)/(45-65) 137/65 mmHg (12/21 0520) SpO2:  [96 %-98 %] 96 % (12/21 0520) Weight:  [93 kg (205 lb 0.4 oz)] 93 kg (205 lb 0.4 oz) (12/20 1700)  Intake/Output from previous day: 12/20 0701 - 12/21 0700 In: 1050 [I.V.:600; IV Piggyback:450] Out: 1120 [Urine:1120] Intake/Output this shift: Total I/O In: -  Out: 200 [Urine:200]   Recent Labs  09/04/13 1400 09/05/13 0454  HGB 9.0* 8.4*    Recent Labs  09/04/13 1400 09/05/13 0454  WBC 10.2 9.6  RBC 2.92* 2.73*  HCT 25.6* 24.1*  PLT 223 216    Recent Labs  09/04/13 1400 09/05/13 0454  NA 126* 130*  K 3.9 4.0  CL 88* 96  CO2 25 26  BUN 21 18  CREATININE 0.73 0.74  GLUCOSE 123* 118*  CALCIUM 8.9 8.4   No results found for this basename: LABPT, INR,  in the last 72 hours  Neurologically intact  Assessment/Plan:     Up with therapy. Will continue to observe.  Fernando Torry A 09/05/2013, 8:12 AM

## 2013-09-05 NOTE — Progress Notes (Signed)
1000cc bolus NS given per reported order

## 2013-09-06 MED ORDER — HYDROCODONE-ACETAMINOPHEN 7.5-325 MG PO TABS
1.0000 | ORAL_TABLET | ORAL | Status: DC | PRN
  Administered 2013-09-06 (×2): 1 via ORAL
  Filled 2013-09-06 (×2): qty 1

## 2013-09-06 MED ORDER — HYDROCODONE-ACETAMINOPHEN 7.5-325 MG PO TABS: 1.0000 | ORAL_TABLET | ORAL | Status: DC | PRN

## 2013-09-06 MED ORDER — TEMAZEPAM 15 MG PO CAPS: 15.0000 mg | ORAL_CAPSULE | Freq: Every evening | ORAL | Status: DC | PRN

## 2013-09-06 MED ORDER — TAMSULOSIN HCL 0.4 MG PO CAPS: 0.4000 mg | ORAL_CAPSULE | ORAL | Status: DC

## 2013-09-06 NOTE — Progress Notes (Signed)
Utilization review completed.  

## 2013-09-06 NOTE — Care Management Note (Signed)
    Page 1 of 1   09/06/2013     6:11:28 PM   CARE MANAGEMENT NOTE 09/06/2013  Patient:  [name] [name], [name] [name]   Account Number:  [id number]  Date Initiated:  09/06/2013  Documentation initiated by:  Colleen Can  Subjective/Objective Assessment:   dx complication of  total knee replacemnt    Genevieve Norlander will resume HHpt services with patient upon discharge.     Action/Plan:   Pt from home with [facility] services in place.  Already has DME   Anticipated DC Date:  09/06/2013   Anticipated DC Plan:  HOME W HOME HEALTH SERVICES      DC Planning Services  CM consult      [facility] Choice  Resumption Of Svcs/PTA Provider   Choice offered to / List presented to:          [facility] arranged  HH-2 PT      [facility] agency  [facility]   Status of service:  Completed, signed off Medicare Important Message given?  NA - LOS <3 / Initial given by admissions (If response is "NO", the following Medicare IM given date fields will be blank) Date Medicare IM given:   Date Additional Medicare IM given:    Discharge Disposition:  HOME W HOME HEALTH SERVICES  Per UR Regulation:    If discussed at Long Length of Stay Meetings, dates discussed:    Comments:

## 2013-09-06 NOTE — Progress Notes (Signed)
Patient has not rested well sat night (this RN observed patient sleeping approx. 45 min x 2 sat night and none up to this point tonight) or tonight.  He has received dilaudid 4 mg po about every 3 hours per order.  He states that he does get relief with medication. He has complained with having to void frequently.  His wife has updated this RN on difficulty with placement of foley prior to surgery. Both patient and his wife have concerns with lack of sleep. Have offered a sleep aid but patient and wife do not want to use at this time. Wife has concerns that xarelto is causing the patient's frequency.

## 2013-09-06 NOTE — Progress Notes (Signed)
Subjective: Patient still with pain and difficulty sleeping although pain and swelling are better than admission   Objective: Vital signs in last 24 hours: Temp:  [98.6 F (37 C)-100.3 F (37.9 C)] 98.6 F (37 C) (12/22 0530) Pulse Rate:  [74-96] 74 (12/22 0530) Resp:  [18-20] 20 (12/22 0530) BP: (131-153)/(57-69) 131/61 mmHg (12/22 0530) SpO2:  [92 %-98 %] 97 % (12/22 0530)  Intake/Output from previous day: 12/21 0701 - 12/22 0700 In: 3285.8 [P.O.:1560; I.V.:1025.8; IV Piggyback:700] Out: 4470 [Urine:4470] Intake/Output this shift: Total I/O In: 850 [I.V.:600; IV Piggyback:250] Out: 2320 [Urine:2320]   Recent Labs  09/04/13 1400 09/05/13 0454  HGB 9.0* 8.4*    Recent Labs  09/04/13 1400 09/05/13 0454  WBC 10.2 9.6  RBC 2.92* 2.73*  HCT 25.6* 24.1*  PLT 223 216    Recent Labs  09/04/13 1400 09/05/13 0454  NA 126* 130*  K 3.9 4.0  CL 88* 96  CO2 25 26  BUN 21 18  CREATININE 0.73 0.74  GLUCOSE 123* 118*  CALCIUM 8.9 8.4   No results found for this basename: LABPT, INR,  in the last 72 hours  Neurologically intact Neurovascular intact RLE swelling is decreased. He is extremely jumpy and guards even if I get near the leg. Overall tenderness has decresed though.There are no signs of infection today.  Assessment/Plan: Right TKA- Plan discharge today after a PT session. Will discontinue Dilaudid and start Norco to see if this helps manage pain better. Also will give script for Restoril to see if he can get some sleep.   Loanne Drilling 09/06/2013, 6:53 AM

## 2013-09-06 NOTE — Progress Notes (Signed)
Physical Therapy Treatment Patient Details Name: [name] [name] MRN: [id number] DOB: 1936/04/05 Today's Date: 09/06/2013 Time: 8295-6213 PT Time Calculation (min): 24 min  PT Assessment / Plan / Recommendation  History of Present Illness recent R TKR readmitted with ?cellulitis   PT Comments   Pt continues motivated but limited by pain with increased activity  Follow Up Recommendations  Home health PT     Does the patient have the potential to tolerate intense rehabilitation     Barriers to Discharge        Equipment Recommendations  None recommended by PT    Recommendations for Other Services    Frequency 7X/week   Progress towards PT Goals Progress towards PT goals: Progressing toward goals  Plan Current plan remains appropriate    Precautions / Restrictions Precautions Precautions: Knee Restrictions Weight Bearing Restrictions: No Other Position/Activity Restrictions: WBAT   Pertinent Vitals/Pain 7/10 with activity; ice packs provided    Mobility  Bed Mobility Bed Mobility: Supine to Sit;Sit to Supine Supine to Sit: 5: Supervision Sit to Supine: 5: Supervision Transfers Transfers: Sit to Stand;Stand to Sit Sit to Stand: 5: Supervision Stand to Sit: 5: Supervision Details for Transfer Assistance: verbal cues for hand placement and R LE forward Ambulation/Gait Ambulation/Gait Assistance: 4: Min guard;5: Supervision Ambulation Distance (Feet): 100 Feet (twice) Assistive device: Rolling walker Ambulation/Gait Assistance Details: min cues for posture and position from RW Gait Pattern: Step-to pattern;Step-through pattern;Shuffle;Antalgic;Trunk flexed Gait velocity: decreased Stairs: Yes Stairs Assistance: 4: Min Editor, commissioning Details (indicate cue type and reason): cues for sequence and foot/RW placement Stair Management Technique: No rails;Backwards Number of Stairs: 2    Exercises Total Joint Exercises Ankle Circles/Pumps: AROM;Both;15  reps;Supine Quad Sets: AROM;10 reps;Supine;Both Heel Slides: AROM;Right;Supine;15 reps Straight Leg Raises: AAROM;Right;10 reps;Supine   PT Diagnosis: Difficulty walking  PT Problem List: Decreased strength;Decreased range of motion;Decreased activity tolerance;Decreased knowledge of use of DME;Decreased mobility PT Treatment Interventions: DME instruction;Gait training;Stair training;Functional mobility training;Therapeutic activities;Therapeutic exercise;Patient/family education   PT Goals (current goals can now be found in the care plan section) Acute Rehab PT Goals Patient Stated Goal: decrease pain PT Goal Formulation: With patient Time For Goal Achievement: 09/08/13 Potential to Achieve Goals: Good  Visit Information  Last PT Received On: 09/06/13 Assistance Needed: +1 History of Present Illness: recent R TKR readmitted with ?cellulitis    Subjective Data  Patient Stated Goal: decrease pain   Cognition  Cognition Arousal/Alertness: Awake/alert Behavior During Therapy: WFL for tasks assessed/performed Overall Cognitive Status: Within Functional Limits for tasks assessed    Balance     End of Session PT - End of Session Activity Tolerance: Patient tolerated treatment well Patient left: in chair;with call bell/phone within reach;with family/visitor present Nurse Communication: Mobility status   GP     [name] 09/06/2013, 12:57 PM

## 2013-09-06 NOTE — Progress Notes (Signed)
Dilaudid 4mg  po and Robaxin 500mg  po given at 0245.  Pt slept well until around 0600.

## 2013-09-06 NOTE — Evaluation (Signed)
Physical Therapy Evaluation Patient Details Name: [name] [name] MRN: [id number] DOB: Dec 31, 1935 Today's Date: 09/06/2013 Time: 1017-1050 PT Time Calculation (min): 33 min  PT Assessment / Plan / Recommendation History of Present Illness  recent R TKR readmitted with ?cellulitis  Clinical Impression  Pt presents with functional mobility limitations 2* decreased R LE strength/ROM and pain.  Pt should progress to d/c home with family assist and HHPT follow up.    PT Assessment  Patient needs continued PT services    Follow Up Recommendations  Home health PT    Does the patient have the potential to tolerate intense rehabilitation      Barriers to Discharge        Equipment Recommendations  None recommended by PT    Recommendations for Other Services     Frequency 7X/week    Precautions / Restrictions Precautions Precautions: Knee Restrictions Weight Bearing Restrictions: No Other Position/Activity Restrictions: WBAT   Pertinent Vitals/Pain 4/10 at rest; 10/10 with increased activity; premed, ice packs provided      Mobility  Bed Mobility Bed Mobility: Supine to Sit;Sit to Supine Supine to Sit: 5: Supervision Sit to Supine: 5: Supervision Transfers Transfers: Sit to Stand;Stand to Sit Sit to Stand: 5: Supervision Stand to Sit: 5: Supervision Details for Transfer Assistance: verbal cues for hand placement and R LE forward Ambulation/Gait Ambulation/Gait Assistance: 4: Min guard;5: Supervision Ambulation Distance (Feet): 25 Feet Assistive device: Rolling walker Ambulation/Gait Assistance Details: min cues for position from RW Gait Pattern: Step-to pattern;Antalgic;Trunk flexed Gait velocity: decreased    Exercises Total Joint Exercises Ankle Circles/Pumps: AROM;Both;15 reps;Supine Quad Sets: AROM;10 reps;Supine;Both Heel Slides: AROM;Right;Supine;15 reps Straight Leg Raises: AAROM;Right;10 reps;Supine   PT Diagnosis: Difficulty walking  PT Problem  List: Decreased strength;Decreased range of motion;Decreased activity tolerance;Decreased knowledge of use of DME;Decreased mobility PT Treatment Interventions: DME instruction;Gait training;Stair training;Functional mobility training;Therapeutic activities;Therapeutic exercise;Patient/family education     PT Goals(Current goals can be found in the care plan section) Acute Rehab PT Goals Patient Stated Goal: decrease pain PT Goal Formulation: With patient Time For Goal Achievement: 09/08/13 Potential to Achieve Goals: Good  Visit Information  Last PT Received On: 09/06/13 Assistance Needed: +1 History of Present Illness: recent R TKR readmitted with ?cellulitis       Prior Functioning  Home Living Family/patient expects to be discharged to:: Private residence Living Arrangements: Spouse/significant other Available Help at Discharge: Family;Friend(s) Type of Home: House Home Access: Stairs to enter [employer] of Steps: 2 Entrance Stairs-Rails: None Home Layout: Two level;Able to live on main level with bedroom/bathroom Home Equipment: Dan Humphreys - 2 wheels Adaptive Equipment: Reacher Prior Function Level of Independence: Independent Comments: keeps and rides horses, very active Communication Communication: No difficulties    Cognition  Cognition Arousal/Alertness: Awake/alert Behavior During Therapy: WFL for tasks assessed/performed Overall Cognitive Status: Within Functional Limits for tasks assessed    Extremity/Trunk Assessment Upper Extremity Assessment Upper Extremity Assessment: Overall WFL for tasks assessed Lower Extremity Assessment Lower Extremity Assessment: RLE deficits/detail RLE Deficits / Details: 3-/5 quads with AAROM at knee -10 - 35  RLE: Unable to fully assess due to pain   Balance    End of Session PT - End of Session Activity Tolerance: Patient tolerated treatment well Patient left: Other (comment) Nurse Communication: Mobility status   GP     Athalie Newhard 09/06/2013, 12:50 PM

## 2013-09-07 NOTE — ED Provider Notes (Signed)
Medical screening examination/treatment/procedure(s) were performed by non-physician practitioner and as supervising physician I was immediately available for consultation/collaboration.    Kiira Brach R Ahaana Rochette, MD 09/07/13 1847 

## 2013-09-08 LAB — CULTURE, BLOOD (ROUTINE X 2): Culture: NO GROWTH

## 2013-09-10 LAB — CULTURE, BLOOD (ROUTINE X 2): Culture: NO GROWTH

## 2013-09-27 NOTE — Discharge Summary (Signed)
Physician Discharge Summary   Patient ID: [name] [name] MRN: [id number] DOB/AGE: 01-09-1936 78 y.o.  Admit date: 09/04/2013 Discharge date: 09/06/2013  Primary Diagnosis:  S/P total knee arthroplasty, right  Cellulitis   Admission Diagnoses:  Past Medical History  Diagnosis Date  . HYPERLIPIDEMIA 04/23/2007  . 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 HYPERTENSION 06/02/2008  . URI 09/01/2008  . ALLERGIC RHINITIS 04/23/2007  . RECTAL BLEEDING 02/04/2008    no recent problems  . BENIGN PROSTATIC HYPERTROPHY 04/23/2007    resolved after TURP  . OSTEOARTHRITIS 04/23/2007  . Palpitations 02/04/2008  . CHEST PAIN 02/04/2008  . NAUSEA 12/29/2008    ? med related, no current problems  . NEPHROLITHIASIS, HX OF 04/23/2007    pt denies this   . Cellulitis june 2013    left side("left flank")  . PONV (postoperative nausea and vomiting)   . H/O blurred vision     both eyes, occasional  . Neuropathy of left foot   . Kidney stone    Discharge Diagnoses:   Active Problems:   S/P total knee arthroplasty  Estimated body mass index is 29.35 kg/(m^2) as calculated from the following:   Height as of this encounter: 5' 10.08" (1.78 m).   Weight as of this encounter: 93 kg (205 lb 0.4 oz).  Procedure:  None during this admission.  Consults: none  HPI: Pt s/p R TKA by Dr. Wynelle Link on Monday 12/15. He was D/C'd home 2 days post-op on 12/17 and actually returned to the ER on 12/18 c/o erythema, swelling. He was given Rx for Keflex. Reports worsening erythema, swelling, as well as new onset fever and shaking chills despite taking Keflex. He has been taking Dilaudid for pain at home. He reports he has attempted to ice and elevate although due to his severe pain he is having trouble getting in a comfortable position to do so. He reports calf pain as well. He is here in the ER with his wife and daughter. They have shown me pictures of his leg from yesterday, and his erythema has worsened. He  also reports new erythema in the right groin/proximal thigh region. His entire right leg is exquisitely tender.  He also is c/o urinary frequency since surgery after his foley was removed. He has had prior prostate surgery as well as voiding issues in the past and follows with Dr. Karsten Ro.  Laboratory Data: Admission on 09/04/2013, Discharged on 09/06/2013  Component Date Value Range Status  . WBC 09/04/2013 10.2  4.0 - 10.5 K/uL Final  . RBC 09/04/2013 2.92* 4.22 - 5.81 MIL/uL Final  . Hemoglobin 09/04/2013 9.0* 13.0 - 17.0 g/dL Final  . HCT 09/04/2013 25.6* 39.0 - 52.0 % Final  . MCV 09/04/2013 87.7  78.0 - 100.0 fL Final  . MCH 09/04/2013 30.8  26.0 - 34.0 pg Final  . MCHC 09/04/2013 35.2  30.0 - 36.0 g/dL Final  . RDW 09/04/2013 13.4  11.5 - 15.5 % Final  . Platelets 09/04/2013 223  150 - 400 K/uL Final  . Neutrophils Relative % 09/04/2013 77  43 - 77 % Final  . Neutro Abs 09/04/2013 7.8* 1.7 - 7.7 K/uL Final  . Lymphocytes Relative 09/04/2013 11* 12 - 46 % Final  . Lymphs Abs 09/04/2013 1.1  0.7 - 4.0 K/uL Final  . Monocytes Relative 09/04/2013 11  3 - 12 % Final  . Monocytes Absolute 09/04/2013 1.1* 0.1 - 1.0 K/uL Final  . Eosinophils Relative 09/04/2013 2  0 - 5 % Final  . Eosinophils Absolute 09/04/2013 0.2  0.0 - 0.7 K/uL Final  . Basophils Relative 09/04/2013 0  0 - 1 % Final  . Basophils Absolute 09/04/2013 0.0  0.0 - 0.1 K/uL Final  . Sodium 09/04/2013 126* 135 - 145 mEq/L Final  . Potassium 09/04/2013 3.9  3.5 - 5.1 mEq/L Final  . Chloride 09/04/2013 88* 96 - 112 mEq/L Final  . CO2 09/04/2013 25  19 - 32 mEq/L Final  . Glucose, Bld 09/04/2013 123* 70 - 99 mg/dL Final  . BUN 09/04/2013 21  6 - 23 mg/dL Final  . Creatinine, Ser 09/04/2013 0.73  0.50 - 1.35 mg/dL Final  . Calcium 09/04/2013 8.9  8.4 - 10.5 mg/dL Final  . Total Protein 09/04/2013 6.7  6.0 - 8.3 g/dL Final  . Albumin 09/04/2013 3.0* 3.5 - 5.2 g/dL Final  . AST 09/04/2013 56* 0 - 37 U/L Final  . ALT  09/04/2013 69* 0 - 53 U/L Final  . Alkaline Phosphatase 09/04/2013 128* 39 - 117 U/L Final  . Total Bilirubin 09/04/2013 1.3* 0.3 - 1.2 mg/dL Final  . GFR calc non Af Amer 09/04/2013 89* >90 mL/min Final  . GFR calc Af Amer 09/04/2013 >90  >90 mL/min Final   Comment: (NOTE)                          The eGFR has been calculated using the CKD EPI equation.                          This calculation has not been validated in all clinical situations.                          eGFR's persistently <90 mL/min signify possible Chronic Kidney                          Disease.  . Color, Urine 09/04/2013 YELLOW  YELLOW Final  . APPearance 09/04/2013 CLEAR  CLEAR Final  . Specific Gravity, Urine 09/04/2013 1.017  1.005 - 1.030 Final  . pH 09/04/2013 5.5  5.0 - 8.0 Final  . Glucose, UA 09/04/2013 NEGATIVE  NEGATIVE mg/dL Final  . Hgb urine dipstick 09/04/2013 NEGATIVE  NEGATIVE Final  . Bilirubin Urine 09/04/2013 NEGATIVE  NEGATIVE Final  . Ketones, ur 09/04/2013 NEGATIVE  NEGATIVE mg/dL Final  . Protein, ur 09/04/2013 NEGATIVE  NEGATIVE mg/dL Final  . Urobilinogen, UA 09/04/2013 1.0  0.0 - 1.0 mg/dL Final  . Nitrite 09/04/2013 NEGATIVE  NEGATIVE Final  . Leukocytes, UA 09/04/2013 SMALL* NEGATIVE Final  . Lactic Acid, Venous 09/04/2013 0.81  0.5 - 2.2 mmol/L Final  . Specimen Description 09/04/2013 BLOOD LEFT HAND   Final  . Special Requests 09/04/2013 BOTTLES DRAWN AEROBIC AND ANAEROBIC 5CC EACH   Final  . Culture  Setup Time 09/04/2013    Final                   Value:09/04/2013 20:21                         Performed at [facility]  . Culture 09/04/2013    Final                   Value:NO GROWTH 5 DAYS  Performed at [facility]  . Report Status 09/04/2013 09/10/2013 FINAL   Final  . Specimen Description 09/04/2013 BLOOD LEFT ANTECUBITAL   Final  . Special Requests 09/04/2013 BOTTLES DRAWN AEROBIC AND ANAEROBIC 5CC EACH   Final  . Culture  Setup Time  09/04/2013    Final                   Value:09/04/2013 20:21                         Performed at [facility]  . Culture 09/04/2013    Final                   Value:NO GROWTH 5 DAYS                         Performed at [facility]  . Report Status 09/04/2013 09/10/2013 FINAL   Final  . WBC, UA 09/04/2013 0-2  <3 WBC/hpf Final  . Bacteria, UA 09/04/2013 RARE  RARE Final  . Casts 09/04/2013 HYALINE CASTS* NEGATIVE Final  . Sodium 09/05/2013 130* 135 - 145 mEq/L Final  . Potassium 09/05/2013 4.0  3.5 - 5.1 mEq/L Final  . Chloride 09/05/2013 96  96 - 112 mEq/L Final   Comment: REPEATED TO VERIFY                          DELTA CHECK NOTED  . CO2 09/05/2013 26  19 - 32 mEq/L Final  . Glucose, Bld 09/05/2013 118* 70 - 99 mg/dL Final  . BUN 09/05/2013 18  6 - 23 mg/dL Final  . Creatinine, Ser 09/05/2013 0.74  0.50 - 1.35 mg/dL Final  . Calcium 09/05/2013 8.4  8.4 - 10.5 mg/dL Final  . GFR calc non Af Amer 09/05/2013 89* >90 mL/min Final  . GFR calc Af Amer 09/05/2013 >90  >90 mL/min Final   Comment: (NOTE)                          The eGFR has been calculated using the CKD EPI equation.                          This calculation has not been validated in all clinical situations.                          eGFR's persistently <90 mL/min signify possible Chronic Kidney                          Disease.  . WBC 09/05/2013 9.6  4.0 - 10.5 K/uL Final  . RBC 09/05/2013 2.73* 4.22 - 5.81 MIL/uL Final  . Hemoglobin 09/05/2013 8.4* 13.0 - 17.0 g/dL Final  . HCT 09/05/2013 24.1* 39.0 - 52.0 % Final  . MCV 09/05/2013 88.3  78.0 - 100.0 fL Final  . MCH 09/05/2013 30.8  26.0 - 34.0 pg Final  . MCHC 09/05/2013 34.9  30.0 - 36.0 g/dL Final  . RDW 09/05/2013 13.6  11.5 - 15.5 % Final  . Platelets 09/05/2013 216  150 - 400 K/uL Final  Admission on 09/02/2013, Discharged on 09/02/2013  Component Date Value Range Status  . Specimen Description 09/02/2013 BLOOD RIGHT ANTECUBITAL   Final  .  Special Requests 09/02/2013 BOTTLES DRAWN AEROBIC AND ANAEROBIC 10CC   Final  . Culture  Setup Time 09/02/2013    Final                   Value:09/02/2013 09:10                         Performed at [facility]  . Culture 09/02/2013    Final                   Value:NO GROWTH 5 DAYS                         Performed at [facility]  . Report Status 09/02/2013 09/08/2013 FINAL   Final  . Specimen Description 09/02/2013 BLOOD LEFT ANTECUBITAL   Final  . Special Requests 09/02/2013 BOTTLES DRAWN AEROBIC AND ANAEROBIC 5CC   Final  . Culture  Setup Time 09/02/2013    Final                   Value:09/02/2013 09:10                         Performed at [facility]  . Culture 09/02/2013    Final                   Value:NO GROWTH 5 DAYS                         Performed at [facility]  . Report Status 09/02/2013 09/08/2013 FINAL   Final  . WBC 09/02/2013 15.4* 4.0 - 10.5 K/uL Final  . RBC 09/02/2013 3.32* 4.22 - 5.81 MIL/uL Final  . Hemoglobin 09/02/2013 10.3* 13.0 - 17.0 g/dL Final  . HCT 09/02/2013 29.2* 39.0 - 52.0 % Final  . MCV 09/02/2013 88.0  78.0 - 100.0 fL Final  . MCH 09/02/2013 31.0  26.0 - 34.0 pg Final  . MCHC 09/02/2013 35.3  30.0 - 36.0 g/dL Final  . RDW 09/02/2013 13.5  11.5 - 15.5 % Final  . Platelets 09/02/2013 154  150 - 400 K/uL Final  . Neutrophils Relative % 09/02/2013 84* 43 - 77 % Final  . Neutro Abs 09/02/2013 13.0* 1.7 - 7.7 K/uL Final  . Lymphocytes Relative 09/02/2013 9* 12 - 46 % Final  . Lymphs Abs 09/02/2013 1.3  0.7 - 4.0 K/uL Final  . Monocytes Relative 09/02/2013 6  3 - 12 % Final  . Monocytes Absolute 09/02/2013 1.0  0.1 - 1.0 K/uL Final  . Eosinophils Relative 09/02/2013 1  0 - 5 % Final  . Eosinophils Absolute 09/02/2013 0.1  0.0 - 0.7 K/uL Final  . Basophils Relative 09/02/2013 0  0 - 1 % Final  . Basophils Absolute 09/02/2013 0.0  0.0 - 0.1 K/uL Final  . Sodium 09/02/2013 130* 135 - 145 mEq/L Final  . Potassium  09/02/2013 3.7  3.5 - 5.1 mEq/L Final  . Chloride 09/02/2013 94* 96 - 112 mEq/L Final  . CO2 09/02/2013 26  19 - 32 mEq/L Final  . Glucose, Bld 09/02/2013 118* 70 - 99 mg/dL Final  . BUN 09/02/2013 22  6 - 23 mg/dL Final  . Creatinine, Ser 09/02/2013 0.78  0.50 - 1.35 mg/dL Final  . Calcium 09/02/2013 8.9  8.4 - 10.5 mg/dL Final  . Total Protein  09/02/2013 6.6  6.0 - 8.3 g/dL Final  . Albumin 09/02/2013 3.3* 3.5 - 5.2 g/dL Final  . AST 09/02/2013 18  0 - 37 U/L Final  . ALT 09/02/2013 18  0 - 53 U/L Final  . Alkaline Phosphatase 09/02/2013 62  39 - 117 U/L Final  . Total Bilirubin 09/02/2013 1.4* 0.3 - 1.2 mg/dL Final  . GFR calc non Af Amer 09/02/2013 87* >90 mL/min Final  . GFR calc Af Amer 09/02/2013 >90  >90 mL/min Final   Comment: (NOTE)                          The eGFR has been calculated using the CKD EPI equation.                          This calculation has not been validated in all clinical situations.                          eGFR's persistently <90 mL/min signify possible Chronic Kidney                          Disease.  . Color, Urine 09/02/2013 YELLOW  YELLOW Final  . APPearance 09/02/2013 CLEAR  CLEAR Final  . Specific Gravity, Urine 09/02/2013 1.017  1.005 - 1.030 Final  . pH 09/02/2013 6.0  5.0 - 8.0 Final  . Glucose, UA 09/02/2013 NEGATIVE  NEGATIVE mg/dL Final  . Hgb urine dipstick 09/02/2013 SMALL* NEGATIVE Final  . Bilirubin Urine 09/02/2013 NEGATIVE  NEGATIVE Final  . Ketones, ur 09/02/2013 NEGATIVE  NEGATIVE mg/dL Final  . Protein, ur 09/02/2013 30* NEGATIVE mg/dL Final  . Urobilinogen, UA 09/02/2013 0.2  0.0 - 1.0 mg/dL Final  . Nitrite 09/02/2013 NEGATIVE  NEGATIVE Final  . Leukocytes, UA 09/02/2013 SMALL* NEGATIVE Final  . Specimen Description 09/02/2013 URINE, CLEAN CATCH   Final  . Special Requests 09/02/2013 NONE   Final  . Culture  Setup Time 09/02/2013    Final                   Value:09/02/2013 09:09                         Performed at [facility]  . Colony Count 09/02/2013    Final                   Value:10,000 COLONIES/ML                         Performed at [facility]  . Culture 09/02/2013    Final                   Value:ENTEROBACTER CLOACAE                         Performed at [facility]  . Report Status 09/02/2013 09/04/2013 FINAL   Final  . Organism ID, Bacteria 09/02/2013 ENTEROBACTER CLOACAE   Final  . Lactic Acid, Venous 09/02/2013 1.07  0.5 - 2.2 mmol/L Final  . WBC, UA 09/02/2013 3-6  <3 WBC/hpf Final  . RBC / HPF 09/02/2013 3-6  <3 RBC/hpf Final  . Bacteria, UA 09/02/2013 RARE  RARE Final  Admission on 08/30/2013, Discharged on  09/01/2013  Component Date Value Range Status  . ABO/RH(D) 08/30/2013 O NEG   Final  . Antibody Screen 08/30/2013 NEG   Final  . Sample Expiration 08/30/2013 09/02/2013   Final  . ABO/RH(D) 08/30/2013 O NEG   Final  . WBC 08/31/2013 13.7* 4.0 - 10.5 K/uL Final  . RBC 08/31/2013 3.85* 4.22 - 5.81 MIL/uL Final  . Hemoglobin 08/31/2013 11.9* 13.0 - 17.0 g/dL Final  . HCT 08/31/2013 34.1* 39.0 - 52.0 % Final  . MCV 08/31/2013 88.6  78.0 - 100.0 fL Final  . MCH 08/31/2013 30.9  26.0 - 34.0 pg Final  . MCHC 08/31/2013 34.9  30.0 - 36.0 g/dL Final  . RDW 08/31/2013 13.2  11.5 - 15.5 % Final  . Platelets 08/31/2013 169  150 - 400 K/uL Final  . Sodium 08/31/2013 135  135 - 145 mEq/L Final  . Potassium 08/31/2013 4.2  3.5 - 5.1 mEq/L Final  . Chloride 08/31/2013 102  96 - 112 mEq/L Final  . CO2 08/31/2013 26  19 - 32 mEq/L Final  . Glucose, Bld 08/31/2013 153* 70 - 99 mg/dL Final  . BUN 08/31/2013 18  6 - 23 mg/dL Final  . Creatinine, Ser 08/31/2013 0.72  0.50 - 1.35 mg/dL Final  . Calcium 08/31/2013 8.3* 8.4 - 10.5 mg/dL Final  . GFR calc non Af Amer 08/31/2013 90* >90 mL/min Final  . GFR calc Af Amer 08/31/2013 >90  >90 mL/min Final   Comment: (NOTE)                          The eGFR has been calculated using the CKD EPI equation.                           This calculation has not been validated in all clinical situations.                          eGFR's persistently <90 mL/min signify possible Chronic Kidney                          Disease.  . WBC 09/01/2013 18.0* 4.0 - 10.5 K/uL Final  . RBC 09/01/2013 3.67* 4.22 - 5.81 MIL/uL Final  . Hemoglobin 09/01/2013 11.8* 13.0 - 17.0 g/dL Final  . HCT 09/01/2013 32.5* 39.0 - 52.0 % Final  . MCV 09/01/2013 88.6  78.0 - 100.0 fL Final  . MCH 09/01/2013 32.2  26.0 - 34.0 pg Final  . MCHC 09/01/2013 36.3* 30.0 - 36.0 g/dL Final  . RDW 09/01/2013 13.6  11.5 - 15.5 % Final  . Platelets 09/01/2013 170  150 - 400 K/uL Final  . Sodium 09/01/2013 134* 135 - 145 mEq/L Final  . Potassium 09/01/2013 4.2  3.5 - 5.1 mEq/L Final  . Chloride 09/01/2013 98  96 - 112 mEq/L Final  . CO2 09/01/2013 25  19 - 32 mEq/L Final  . Glucose, Bld 09/01/2013 141* 70 - 99 mg/dL Final  . BUN 09/01/2013 15  6 - 23 mg/dL Final  . Creatinine, Ser 09/01/2013 0.74  0.50 - 1.35 mg/dL Final  . Calcium 09/01/2013 8.7  8.4 - 10.5 mg/dL Final  . GFR calc non Af Amer 09/01/2013 89* >90 mL/min Final  . GFR calc Af Amer 09/01/2013 >90  >90 mL/min Final   Comment: (NOTE)  The eGFR has been calculated using the CKD EPI equation.                          This calculation has not been validated in all clinical situations.                          eGFR's persistently <90 mL/min signify possible Chronic Kidney                          Disease.  Hospital Outpatient Visit on 08/23/2013  Component Date Value Range Status  . MRSA, PCR 08/23/2013 NEGATIVE  NEGATIVE Final  . Staphylococcus aureus 08/23/2013 NEGATIVE  NEGATIVE Final   Comment:                                 The Xpert SA Assay (FDA                          approved for NASAL specimens                          in patients over 55 years of age),                          is one component of                          a comprehensive surveillance                           program.  Test performance has                          been validated by [employer] for patients greater                          than or equal to 5 year old.                          It is not intended                          to diagnose infection nor to                          guide or monitor treatment.  Marland Kitchen aPTT 08/23/2013 26  24 - 37 seconds Final  . WBC 08/23/2013 9.0  4.0 - 10.5 K/uL Final  . RBC 08/23/2013 4.67  4.22 - 5.81 MIL/uL Final  . Hemoglobin 08/23/2013 14.6  13.0 - 17.0 g/dL Final  . HCT 08/23/2013 41.2  39.0 - 52.0 % Final  . MCV 08/23/2013 88.2  78.0 - 100.0 fL Final  . MCH 08/23/2013 31.3  26.0 - 34.0 pg Final  . MCHC 08/23/2013 35.4  30.0 - 36.0 g/dL Final  . RDW 08/23/2013 13.5  11.5 - 15.5 % Final  .  Platelets 08/23/2013 164  150 - 400 K/uL Final  . Sodium 08/23/2013 139  135 - 145 mEq/L Final  . Potassium 08/23/2013 4.2  3.5 - 5.1 mEq/L Final  . Chloride 08/23/2013 103  96 - 112 mEq/L Final  . CO2 08/23/2013 24  19 - 32 mEq/L Final  . Glucose, Bld 08/23/2013 94  70 - 99 mg/dL Final  . BUN 08/23/2013 29* 6 - 23 mg/dL Final  . Creatinine, Ser 08/23/2013 0.66  0.50 - 1.35 mg/dL Final  . Calcium 08/23/2013 9.6  8.4 - 10.5 mg/dL Final  . Total Protein 08/23/2013 7.3  6.0 - 8.3 g/dL Final  . Albumin 08/23/2013 4.3  3.5 - 5.2 g/dL Final  . AST 08/23/2013 24  0 - 37 U/L Final  . ALT 08/23/2013 25  0 - 53 U/L Final  . Alkaline Phosphatase 08/23/2013 68  39 - 117 U/L Final  . Total Bilirubin 08/23/2013 0.8  0.3 - 1.2 mg/dL Final  . GFR calc non Af Amer 08/23/2013 >90  >90 mL/min Final  . GFR calc Af Amer 08/23/2013 >90  >90 mL/min Final   Comment: (NOTE)                          The eGFR has been calculated using the CKD EPI equation.                          This calculation has not been validated in all clinical situations.                          eGFR's persistently <90 mL/min signify possible Chronic Kidney                           Disease.  Marland Kitchen Prothrombin Time 08/23/2013 12.6  11.6 - 15.2 seconds Final  . INR 08/23/2013 0.96  0.00 - 1.49 Final  . Color, Urine 08/23/2013 YELLOW  YELLOW Final  . APPearance 08/23/2013 CLEAR  CLEAR Final  . Specific Gravity, Urine 08/23/2013 1.019  1.005 - 1.030 Final  . pH 08/23/2013 5.5  5.0 - 8.0 Final  . Glucose, UA 08/23/2013 NEGATIVE  NEGATIVE mg/dL Final  . Hgb urine dipstick 08/23/2013 NEGATIVE  NEGATIVE Final  . Bilirubin Urine 08/23/2013 NEGATIVE  NEGATIVE Final  . Ketones, ur 08/23/2013 NEGATIVE  NEGATIVE mg/dL Final  . Protein, ur 08/23/2013 NEGATIVE  NEGATIVE mg/dL Final  . Urobilinogen, UA 08/23/2013 0.2  0.0 - 1.0 mg/dL Final  . Nitrite 08/23/2013 NEGATIVE  NEGATIVE Final  . Leukocytes, UA 08/23/2013 NEGATIVE  NEGATIVE Final   MICROSCOPIC NOT DONE ON URINES WITH NEGATIVE PROTEIN, BLOOD, LEUKOCYTES, NITRITE, OR GLUCOSE <1000 mg/dL.     X-Rays:Dg Chest Port 1 View  09/02/2013   CLINICAL DATA:  Fever, recent knee replacement surgery  EXAM: PORTABLE CHEST - 1 VIEW  COMPARISON:  Portable exam 0113 hr compared to 08/23/2013  FINDINGS: Enlargement of cardiac silhouette with pulmonary vascular congestion.  Stable mediastinal contours.  Peribronchial thickening without infiltrate, pleural effusion or pneumothorax.  Prior right shoulder joint replacement.  Left glenohumeral and AC joint degenerative changes.  Scattered endplate spur formation thoracic spine.  IMPRESSION: Enlargement of cardiac silhouette with pulmonary vascular congestion.  Minimal bronchitic changes without infiltrate.   Electronically Signed   By: Lavonia Dana M.D.   On: 09/02/2013 01:29   Dg Knee  Right Port  09/04/2013   CLINICAL DATA:  History of right total knee joint replacement on 30 August 2013.  EXAM: PORTABLE RIGHT KNEE - 1-2 VIEW  COMPARISON:  None.  FINDINGS: The knee prosthesis exhibits normal positioning. The interface between the native bone and the prosthesis appears good. There is mild soft  tissue swelling diffusely. There are small amounts of soft tissue gas. No abnormal soft tissue calcifications are demonstrated.  IMPRESSION: The knee joint prosthesis and adjacent native bone appear normal. There is soft tissue swelling however. A small amounts of soft tissue gas are present which may be residual from the surgery. Infection is not excluded.  Orthopedic evaluation is recommended.   Electronically Signed   By: David  Martinique   On: 09/04/2013 14:59    EKG: Orders placed during the hospital encounter of 08/23/13  . EKG 12-LEAD  . EKG 12-LEAD  . EKG     Hospital Course: Pt s/p R TKA by Dr. Wynelle Link on Monday 12/15. He was D/C'd home 2 days post-op on 12/17 and actually returned to the ER on 12/18 c/o erythema, swelling. He was given Rx for Keflex. Reports worsening erythema, swelling, as well as new onset fever and shaking chills despite taking Keflex. He has been taking Dilaudid for pain at home. He reports he has attempted to ice and elevate although due to his severe pain he is having trouble getting in a comfortable position to do so. He reports calf pain as well. He came into the ER with his wife and daughter. His entire right leg is exquisitely tender.  He also is c/o urinary frequency since surgery after his foley was removed.   TELLY JAWAD is a 78 y.o. who was admitted to [facility].  He underwent a Doppler. Preliminary report: There is no DVT or SVT noted in the right lower extremity. There is a moderate to large sized Baker's cyst noted in the right popliteal fossa.  He was placed on IV Vancomycin and pain medications. PT was ordered for total joint protocol.  Discharge planning consulted to help with postop disposition and equipment needs.  They started to get up OOB with therapy.  Continued to work with therapy into day two and walked about 25 and later 100 feet.  Dressings were changed on a daily basis for wound observation. His pain was slowly improving. Patient  was seen in rounds by Dr. Wynelle Link and was ready to go home.  Plan discharged after a PT session. Discontinued Dilaudid and started Norco to see if that helped manage pain better. Also will given a script for Restoril to see if that can help him get some sleep.  Discharge Medications: Prior to Admission medications   Medication Sig Start Date End Date Taking? Authorizing Provider  benazepril-hydrochlorthiazide (LOTENSIN HCT) 20-12.5 MG per tablet Take 1 tablet by mouth every morning.   Yes Historical Provider, MD  fluticasone (FLONASE) 50 MCG/ACT nasal spray Place 1 spray into both nostrils daily.   Yes Historical Provider, MD  gabapentin (NEURONTIN) 600 MG tablet Take 600 mg by mouth 3 (three) times daily.   Yes Historical Provider, MD  loratadine (CLARITIN) 10 MG tablet Take 10 mg by mouth every evening.    Yes Historical Provider, MD  methocarbamol (ROBAXIN) 500 MG tablet Take 1 tablet (500 mg total) by mouth every 6 (six) hours as needed for muscle spasms. 09/01/13  Yes Braxtin Bamba Dara Lords, PA-C  potassium chloride (K-DUR) 10 MEQ tablet Take 10 mEq by mouth  2 (two) times daily.   Yes Historical Provider, MD  psyllium (HYDROCIL/METAMUCIL) 95 % PACK Take 1 packet by mouth daily.   Yes Historical Provider, MD  rivaroxaban (XARELTO) 10 MG TABS tablet Take 1 tablet (10 mg total) by mouth daily with breakfast. Take Xarelto for two and a half more weeks, then discontinue Xarelto. Once the patient has completed the blood thinner regimen, then take a Baby 81 mg Aspirin daily for four more weeks.  May resume Celebrex at that time also. 09/01/13  Yes Alexzandrew Dara Lords, PA-C  simvastatin (ZOCOR) 40 MG tablet Take 40 mg by mouth at bedtime.    Yes Historical Provider, MD  traMADol (ULTRAM) 50 MG tablet Take 1-2 tablets (50-100 mg total) by mouth every 6 (six) hours as needed (mild pain). 09/01/13  Yes Alexzandrew Dara Lords, PA-C  HYDROcodone-acetaminophen (NORCO) 7.5-325 MG per tablet Take 1-2 tablets by mouth  every 4 (four) hours as needed for moderate pain. 09/06/13   Gearlean Alf, MD  tamsulosin (FLOMAX) 0.4 MG CAPS capsule Take 1 capsule (0.4 mg total) by mouth daily after supper. 09/06/13   Gearlean Alf, MD  temazepam (RESTORIL) 15 MG capsule Take 1 capsule (15 mg total) by mouth at bedtime as needed for sleep. 09/06/13   Gearlean Alf, MD    Diet: Cardiac diet Activity:WBAT Follow-up:in 2 weeks Disposition - Home Discharged Condition: slowly improving        Future Appointments Provider Department Dept Phone   08/25/2014 9:30 AM Marletta Lor, MD [place] [facility] [place]       Medication List    STOP taking these medications       HYDROmorphone 2 MG tablet  Commonly known as:  DILAUDID      TAKE these medications       benazepril-hydrochlorthiazide 20-12.5 MG per tablet  Commonly known as:  LOTENSIN HCT  Take 1 tablet by mouth every morning.     cephALEXin 500 MG capsule  Commonly known as:  KEFLEX  Take 1,000 mg by mouth 2 (two) times daily. For 7 days     fluticasone 50 MCG/ACT nasal spray  Commonly known as:  FLONASE  Place 1 spray into both nostrils daily.     gabapentin 600 MG tablet  Commonly known as:  NEURONTIN  Take 600 mg by mouth 3 (three) times daily.     HYDROcodone-acetaminophen 7.5-325 MG per tablet  Commonly known as:  NORCO  Take 1-2 tablets by mouth every 4 (four) hours as needed for moderate pain.     loratadine 10 MG tablet  Commonly known as:  CLARITIN  Take 10 mg by mouth every evening.     methocarbamol 500 MG tablet  Commonly known as:  ROBAXIN  Take 1 tablet (500 mg total) by mouth every 6 (six) hours as needed for muscle spasms.     potassium chloride 10 MEQ tablet  Commonly known as:  K-DUR  Take 10 mEq by mouth 2 (two) times daily.     psyllium 95 % Pack  Commonly known as:  HYDROCIL/METAMUCIL  Take 1 packet by mouth daily.     rivaroxaban 10 MG Tabs tablet  Commonly known as:  XARELTO  -  Take 1 tablet (10 mg total) by mouth daily with breakfast. Take Xarelto for two and a half more weeks, then discontinue Xarelto.  - Once the patient has completed the blood thinner regimen, then take a Baby 81 mg Aspirin daily for four more weeks.  May resume Celebrex at that time also.     simvastatin 40 MG tablet  Commonly known as:  ZOCOR  Take 40 mg by mouth at bedtime.     tamsulosin 0.4 MG Caps capsule  Commonly known as:  FLOMAX  Take 1 capsule (0.4 mg total) by mouth daily after supper.     temazepam 15 MG capsule  Commonly known as:  RESTORIL  Take 1 capsule (15 mg total) by mouth at bedtime as needed for sleep.     traMADol 50 MG tablet  Commonly known as:  ULTRAM  Take 1-2 tablets (50-100 mg total) by mouth every 6 (six) hours as needed (mild pain).       Follow-up Information   Follow up with Gearlean Alf, MD. Schedule an appointment as soon as possible for a visit on 09/14/2013. (Call [phone] tomorrow to make the appointment)    Specialty:  Orthopedic Surgery   Contact information:   [address] [place] [address] [phone]       Signed: Mickel Crow 09/30/2013, 9:56 AM

## 2013-10-10 ENCOUNTER — Telehealth: Payer: Self-pay | Admitting: Internal Medicine

## 2013-10-10 NOTE — Telephone Encounter (Signed)
[facility]  Requesting refills of the following:  fluticasone (FLONASE) 50 MCG/ACT nasal spray gabapentin (NEURONTIN) 600 MG tablet benazepril-hydrochlorthiazide (LOTENSIN HCT) 20-12.5 MG per tablet simvastatin (ZOCOR) 40 MG tablet Celebrex Capsule 200mg 

## 2013-10-11 MED ORDER — CELECOXIB 200 MG PO CAPS: 200.0000 mg | ORAL_CAPSULE | Freq: Every day | ORAL | Status: DC

## 2013-10-11 MED ORDER — BENAZEPRIL-HYDROCHLOROTHIAZIDE 20-12.5 MG PO TABS: 1.0000 | ORAL_TABLET | Freq: Every morning | ORAL | Status: DC

## 2013-10-11 MED ORDER — FLUTICASONE PROPIONATE 50 MCG/ACT NA SUSP: 1.0000 | Freq: Every day | NASAL | Status: DC

## 2013-10-11 MED ORDER — GABAPENTIN 600 MG PO TABS: 600.0000 mg | ORAL_TABLET | Freq: Three times a day (TID) | ORAL | Status: DC

## 2013-10-11 MED ORDER — SIMVASTATIN 40 MG PO TABS: 40.0000 mg | ORAL_TABLET | Freq: Every day | ORAL | Status: DC

## 2013-10-11 NOTE — Telephone Encounter (Signed)
Rx's sent to [facility] 

## 2013-12-22 ENCOUNTER — Telehealth: Payer: Self-pay | Admitting: Internal Medicine

## 2013-12-22 MED ORDER — POTASSIUM CHLORIDE ER 10 MEQ PO TBCR: 10.0000 meq | EXTENDED_RELEASE_TABLET | Freq: Two times a day (BID) | ORAL | Status: DC

## 2013-12-22 NOTE — Telephone Encounter (Signed)
Rx sent 

## 2013-12-22 NOTE — Telephone Encounter (Signed)
[facility] (MAIL ORDER) [facility] - [place], [place] is requesting re-fill potassium chloride (K-DUR) 10 MEQ tablet

## 2013-12-28 ENCOUNTER — Telehealth: Payer: Self-pay | Admitting: Internal Medicine

## 2013-12-28 MED ORDER — POTASSIUM CITRATE ER 10 MEQ (1080 MG) PO TBCR: 20.0000 meq | EXTENDED_RELEASE_TABLET | Freq: Two times a day (BID) | ORAL | Status: DC

## 2013-12-28 NOTE — Telephone Encounter (Signed)
Spoke to pt's wife and clarified pt's Rx. New Rx sent to [facility].

## 2013-12-28 NOTE — Telephone Encounter (Signed)
Pt's wife is calling regarding pt's rx that was sent to [facility]. Pt states the wrong rx was sent, pt needs potassium citrate tab 1080 mg,sent to [facility]. Pt states [facility] received potassium chloride (k-dur) 10 meq tab. Pt has not been on that medication in years and states to remove from his list. States dr.k is aware of the potassium citrate.

## 2014-05-13 ENCOUNTER — Other Ambulatory Visit: Payer: Self-pay | Admitting: *Deleted

## 2014-05-13 MED ORDER — SIMVASTATIN 40 MG PO TABS: 40.0000 mg | ORAL_TABLET | Freq: Every day | ORAL | Status: DC

## 2014-05-13 MED ORDER — GABAPENTIN 600 MG PO TABS: 600.0000 mg | ORAL_TABLET | Freq: Three times a day (TID) | ORAL | Status: DC

## 2014-05-13 MED ORDER — CELECOXIB 200 MG PO CAPS: 200.0000 mg | ORAL_CAPSULE | Freq: Every day | ORAL | Status: DC

## 2014-05-13 MED ORDER — BENAZEPRIL-HYDROCHLOROTHIAZIDE 20-12.5 MG PO TABS: 1.0000 | ORAL_TABLET | Freq: Every morning | ORAL | Status: DC

## 2014-07-04 ENCOUNTER — Telehealth: Payer: Self-pay | Admitting: *Deleted

## 2014-07-04 ENCOUNTER — Encounter: Payer: Self-pay | Admitting: Internal Medicine

## 2014-07-04 ENCOUNTER — Other Ambulatory Visit (HOSPITAL_COMMUNITY): Payer: Self-pay | Admitting: Cardiology

## 2014-07-04 ENCOUNTER — Ambulatory Visit (HOSPITAL_COMMUNITY): Payer: Medicare Other | Attending: Cardiology | Admitting: Cardiology

## 2014-07-04 ENCOUNTER — Ambulatory Visit (INDEPENDENT_AMBULATORY_CARE_PROVIDER_SITE_OTHER): Payer: Medicare Other | Admitting: Internal Medicine

## 2014-07-04 VITALS — BP 158/80 | HR 72 | Temp 98.0°F | Resp 20 | Ht 70.0 in | Wt 205.0 lb

## 2014-07-04 DIAGNOSIS — M7989 Other specified soft tissue disorders: Secondary | ICD-10-CM

## 2014-07-04 DIAGNOSIS — R609 Edema, unspecified: Secondary | ICD-10-CM

## 2014-07-04 DIAGNOSIS — M79661 Pain in right lower leg: Secondary | ICD-10-CM

## 2014-07-04 DIAGNOSIS — Z96651 Presence of right artificial knee joint: Secondary | ICD-10-CM

## 2014-07-04 DIAGNOSIS — M79604 Pain in right leg: Secondary | ICD-10-CM

## 2014-07-04 DIAGNOSIS — M170 Bilateral primary osteoarthritis of knee: Secondary | ICD-10-CM

## 2014-07-04 DIAGNOSIS — I10 Essential (primary) hypertension: Secondary | ICD-10-CM

## 2014-07-04 NOTE — Progress Notes (Signed)
Right LE Venous duplex performed.

## 2014-07-04 NOTE — Telephone Encounter (Signed)
Spoke to pt, told him no blood clot, has a ruptured Baker's cyst right lower leg and need to follow up with Dr. Linus Orn per Dr. Raliegh Ip. Pt verbalized understanding.

## 2014-07-04 NOTE — Progress Notes (Signed)
Subjective:    Patient ID: [name] [name], male    DOB: [birth year], 78 y.o.   MRN: [id number]  HPI 78 year old patient who presents with a two-week history of a slightly painful and swollen right leg.  He is status post right total knee replacement surgery about 10 months ago. No pulmonary complaints Last night.  He also had the onset of occasional sharp, fleeting pain involving the left supraorbital area.  Pain has not recurred in several hours  Past Medical History  Diagnosis Date  . HYPERLIPIDEMIA 04/23/2007  . 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 HYPERTENSION 06/02/2008  . URI 09/01/2008  . ALLERGIC RHINITIS 04/23/2007  . RECTAL BLEEDING 02/04/2008    no recent problems  . BENIGN PROSTATIC HYPERTROPHY 04/23/2007    resolved after TURP  . OSTEOARTHRITIS 04/23/2007  . Palpitations 02/04/2008  . CHEST PAIN 02/04/2008  . NAUSEA 12/29/2008    ? med related, no current problems  . NEPHROLITHIASIS, HX OF 04/23/2007    pt denies this   . Cellulitis june 2013    left side("left flank")  . PONV (postoperative nausea and vomiting)   . H/O blurred vision     both eyes, occasional  . Neuropathy of left foot   . Kidney stone     History   Social History  . Marital Status: Married    Spouse Name: N/A    Number of Children: N/A  . Years of Education: N/A   Occupational History  . Not on file.   Social History Main Topics  . Smoking status: Never Smoker   . Smokeless tobacco: Never Used  . Alcohol Use: No  . Drug Use: No  . Sexual Activity: Yes   Other Topics Concern  . Not on file   Social History Narrative  . No narrative on file    Past Surgical History  Procedure Laterality Date  . Appendectomy    . Hernia repair    . Lithotripsy  1992    cystoscopy with stent placement also done(1 stone fragment remains)  . Right shoulder replacement  Sep 28, 2009  . Left foot surgery for fx  Jul 30, 2011  . Transurethral resection of prostate  03/27/2012   Procedure: TRANSURETHRAL RESECTION OF THE PROSTATE WITH GYRUS INSTRUMENTS;  Surgeon: Claybon Jabs, MD;  Location: [facility];  Service: Urology;;  . Total knee arthroplasty Right 08/30/2013    Procedure: RIGHT TOTAL KNEE ARTHROPLASTY RIGHT ;  Surgeon: Gearlean Alf, MD;  Location: [facility];  Service: Orthopedics;  Laterality: Right;    Family History  Problem Relation Age of Onset  . Heart disease Mother 90    MI  . Stroke Father 32  . COPD Sister   . Diabetes Sister   . Diabetes Brother     No Known Allergies  Current Outpatient Prescriptions on File Prior to Visit  Medication Sig Dispense Refill  . benazepril-hydrochlorthiazide (LOTENSIN HCT) 20-12.5 MG per tablet Take 1 tablet by mouth every morning.  90 tablet  1  . celecoxib (CELEBREX) 200 MG capsule Take 1 capsule (200 mg total) by mouth daily.  90 capsule  3  . fluticasone (FLONASE) 50 MCG/ACT nasal spray Place 1 spray into both nostrils daily.  48 g  3  . gabapentin (NEURONTIN) 600 MG tablet Take 1 tablet (600 mg total) by mouth 3 (three) times daily.  270 tablet  1  . loratadine (CLARITIN) 10 MG tablet Take 10 mg by  mouth every evening.       . potassium citrate (UROCIT-K) 10 MEQ (1080 MG) SR tablet Take 2 tablets (20 mEq total) by mouth 2 (two) times daily with a meal.  360 tablet  3  . psyllium (HYDROCIL/METAMUCIL) 95 % PACK Take 1 packet by mouth daily.      . simvastatin (ZOCOR) 40 MG tablet Take 1 tablet (40 mg total) by mouth at bedtime.  90 tablet  1   No current facility-administered medications on file prior to visit.    BP 158/80  Pulse 72  Temp(Src) 98 F (36.7 C) (Oral)  Resp 20  Ht 5\' 10"  (1.778 m)  Wt 205 lb (92.987 kg)  BMI 29.41 kg/m2  SpO2 98%      Review of Systems  Constitutional: Negative for fever, chills, appetite change and fatigue.  HENT: Negative for congestion, dental problem, ear pain, hearing loss, sore throat, tinnitus, trouble swallowing and voice change.   Eyes: Negative for pain,  discharge and visual disturbance.  Respiratory: Negative for cough, chest tightness, wheezing and stridor.   Cardiovascular: Positive for leg swelling. Negative for chest pain and palpitations.  Gastrointestinal: Negative for nausea, vomiting, abdominal pain, diarrhea, constipation, blood in stool and abdominal distention.  Genitourinary: Negative for urgency, hematuria, flank pain, discharge, difficulty urinating and genital sores.  Musculoskeletal: Negative for arthralgias, back pain, gait problem, joint swelling, myalgias and neck stiffness.  Skin: Negative for rash.  Neurological: Negative for dizziness, syncope, speech difficulty, weakness, numbness and headaches.  Hematological: Negative for adenopathy. Does not bruise/bleed easily.  Psychiatric/Behavioral: Negative for behavioral problems and dysphoric mood. The patient is not nervous/anxious.        Objective:   Physical Exam  Constitutional: He is oriented to person, place, and time. He appears well-developed.  HENT:  Head: Normocephalic.  Right Ear: External ear normal.  Left Ear: External ear normal.  Eyes: Conjunctivae and EOM are normal.  Neck: Normal range of motion.  Cardiovascular: Normal rate and normal heart sounds.   Pulmonary/Chest: Breath sounds normal.  Abdominal: Bowel sounds are normal.  Musculoskeletal: Normal range of motion. He exhibits edema and tenderness.  Status post right total knee replacement surgery Considerable tenderness and swelling involving the right calf area  Neurological: He is alert and oriented to person, place, and time.  Psychiatric: He has a normal mood and affect. His behavior is normal.          Assessment & Plan:   Right leg pain and swelling.  Rule out DVT Rule out Baker's cyst Osteoarthritis status post right total knee replacement surgery Essential hypertension, stable History of left hemifacial pain.  Will observe at present  Will need anticoagulation if DVT is  confirmed.  Will refer to orthopedics if Baker's cyst is confirmed

## 2014-07-04 NOTE — Patient Instructions (Signed)
Venous Doppler study, the right leg today as discussed  Call or return to clinic prn if these symptoms worsen or fail to improve as anticipated.

## 2014-07-04 NOTE — Progress Notes (Signed)
Pre visit review using our clinic review tool, if applicable. No additional management support is needed unless otherwise documented below in the visit note. 

## 2014-07-05 ENCOUNTER — Telehealth: Payer: Self-pay | Admitting: Internal Medicine

## 2014-07-05 DIAGNOSIS — M660 Rupture of popliteal cyst: Secondary | ICD-10-CM

## 2014-07-05 NOTE — Telephone Encounter (Signed)
Order for referral to Dr. Wynelle Link done and Venous Doppler report faxed to his office at [phone].

## 2014-07-05 NOTE — Telephone Encounter (Signed)
Pt called and asked that we make a referral for him to Dr Wynelle Link, not Dr Gladstone Lighter and that we send his venous doppler from yesterday.

## 2014-08-25 ENCOUNTER — Encounter: Payer: Medicare Other | Admitting: Internal Medicine

## 2014-09-13 ENCOUNTER — Ambulatory Visit: Payer: Medicare Other | Admitting: Internal Medicine

## 2014-09-13 ENCOUNTER — Other Ambulatory Visit (INDEPENDENT_AMBULATORY_CARE_PROVIDER_SITE_OTHER): Payer: Medicare Other

## 2014-09-13 VITALS — BP 110/70 | HR 71 | Temp 98.0°F | Ht 69.5 in | Wt 208.0 lb

## 2014-09-13 DIAGNOSIS — I10 Essential (primary) hypertension: Secondary | ICD-10-CM

## 2014-09-13 DIAGNOSIS — M159 Polyosteoarthritis, unspecified: Secondary | ICD-10-CM

## 2014-09-13 DIAGNOSIS — Z0000 Encounter for general adult medical examination without abnormal findings: Secondary | ICD-10-CM

## 2014-09-13 DIAGNOSIS — M150 Primary generalized (osteo)arthritis: Secondary | ICD-10-CM

## 2014-09-13 DIAGNOSIS — E785 Hyperlipidemia, unspecified: Secondary | ICD-10-CM

## 2014-09-13 LAB — LIPID PANEL
CHOL/HDL RATIO: 3
Cholesterol: 146 mg/dL (ref 0–200)
HDL: 45.2 mg/dL (ref >39.00)
LDL Cholesterol: 81 mg/dL (ref 0–99)
NonHDL: 100.8
TRIGLYCERIDES: 97 mg/dL (ref 0.0–149.0)
VLDL: 19.4 mg/dL (ref 0.0–40.0)

## 2014-09-13 LAB — TSH: TSH: 3.34 u[IU]/mL (ref 0.35–4.50)

## 2014-09-13 LAB — POCT URINALYSIS DIPSTICK
Bilirubin, UA: NEGATIVE
Glucose, UA: NEGATIVE
Ketones, UA: NEGATIVE
LEUKOCYTES UA: NEGATIVE
NITRITE UA: NEGATIVE
PH UA: 5
PROTEIN UA: NEGATIVE
RBC UA: NEGATIVE
Spec Grav, UA: 1.02
Urobilinogen, UA: 0.2

## 2014-09-13 LAB — CBC WITH DIFFERENTIAL/PLATELET
BASOS ABS: 0.1 10*3/uL (ref 0.0–0.1)
Basophils Relative: 1.1 % (ref 0.0–3.0)
EOS ABS: 0.3 10*3/uL (ref 0.0–0.7)
Eosinophils Relative: 3.7 % (ref 0.0–5.0)
HEMATOCRIT: 43.3 % (ref 39.0–52.0)
HEMOGLOBIN: 14.5 g/dL (ref 13.0–17.0)
LYMPHS ABS: 1.4 10*3/uL (ref 0.7–4.0)
Lymphocytes Relative: 20 % (ref 12.0–46.0)
MCHC: 33.5 g/dL (ref 30.0–36.0)
MCV: 91 fl (ref 78.0–100.0)
MONO ABS: 0.6 10*3/uL (ref 0.1–1.0)
MONOS PCT: 8.7 % (ref 3.0–12.0)
Neutro Abs: 4.7 10*3/uL (ref 1.4–7.7)
Neutrophils Relative %: 66.5 % (ref 43.0–77.0)
Platelets: 192 10*3/uL (ref 150.0–400.0)
RBC: 4.76 Mil/uL (ref 4.22–5.81)
RDW: 13.6 % (ref 11.5–15.5)
WBC: 7.1 10*3/uL (ref 4.0–10.5)

## 2014-09-13 LAB — BASIC METABOLIC PANEL
BUN: 32 mg/dL — ABNORMAL HIGH (ref 6–23)
CO2: 26 meq/L (ref 19–32)
Calcium: 9.5 mg/dL (ref 8.4–10.5)
Chloride: 105 mEq/L (ref 96–112)
Creatinine, Ser: 0.8 mg/dL (ref 0.4–1.5)
GFR: 98.47 mL/min (ref >60.00)
GLUCOSE: 101 mg/dL — AB (ref 70–99)
POTASSIUM: 4.9 meq/L (ref 3.5–5.1)
SODIUM: 140 meq/L (ref 135–145)

## 2014-09-13 LAB — HEPATIC FUNCTION PANEL
ALBUMIN: 4.5 g/dL (ref 3.5–5.2)
ALT: 24 U/L (ref 0–53)
AST: 19 U/L (ref 0–37)
Alkaline Phosphatase: 61 U/L (ref 39–117)
Bilirubin, Direct: 0.2 mg/dL (ref 0.0–0.3)
TOTAL PROTEIN: 6.7 g/dL (ref 6.0–8.3)
Total Bilirubin: 1.2 mg/dL (ref 0.2–1.2)

## 2014-09-13 LAB — PSA: PSA: 3.05 ng/mL (ref 0.10–4.00)

## 2014-09-13 MED ORDER — TADALAFIL 5 MG PO TABS: ORAL_TABLET | ORAL | Status: DC

## 2014-09-13 NOTE — Progress Notes (Signed)
Patient ID: [name] [name], male   DOB: 07-22-1936, 78 y.o.   MRN: [id number]  Subjective:    Patient ID: [name] [name], male    DOB: 1936-02-20, 78 y.o.   MRN: [id number]  Hypertension Pertinent negatives include no chest pain, headaches, neck pain, palpitations or shortness of breath.   History of Present Illness:   78 year old patient who is seen today for a wellness examination.  He is followed by urology for BPH and has a history of nephrolithiasis. He has dyslipidemia osteoarthritis and hypertension. He is doing quite well and denies any cardiopulmonary complaints. He has had a TURP and also knee replacement surgery  Past Medical History  Diagnosis Date  . HYPERLIPIDEMIA 04/23/2007  . 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 HYPERTENSION 06/02/2008  . URI 09/01/2008  . ALLERGIC RHINITIS 04/23/2007  . RECTAL BLEEDING 02/04/2008    no recent problems  . BENIGN PROSTATIC HYPERTROPHY 04/23/2007    resolved after TURP  . OSTEOARTHRITIS 04/23/2007  . Palpitations 02/04/2008  . CHEST PAIN 02/04/2008  . NAUSEA 12/29/2008    ? med related, no current problems  . NEPHROLITHIASIS, HX OF 04/23/2007    pt denies this   . Cellulitis june 2013    left side("left flank")  . PONV (postoperative nausea and vomiting)   . H/O blurred vision     both eyes, occasional  . Neuropathy of left foot   . Kidney stone     History   Social History  . Marital Status: Married    Spouse Name: N/A    Number of Children: N/A  . Years of Education: N/A   Occupational History  . Not on file.   Social History Main Topics  . Smoking status: Never Smoker   . Smokeless tobacco: Never Used  . Alcohol Use: No  . Drug Use: No  . Sexual Activity: Yes   Other Topics Concern  . Not on file   Social History Narrative  . No narrative on file    Past Surgical History  Procedure Laterality Date  . Appendectomy    . Hernia repair    . Lithotripsy  1992    cystoscopy with stent placement  also done(1 stone fragment remains)  . Right shoulder replacement  Sep 28, 2009  . Left foot surgery for fx  Jul 30, 2011  . Transurethral resection of prostate  03/27/2012    Procedure: TRANSURETHRAL RESECTION OF THE PROSTATE WITH GYRUS INSTRUMENTS;  Surgeon: Claybon Jabs, MD;  Location: [facility];  Service: Urology;;  . Total knee arthroplasty Right 08/30/2013    Procedure: RIGHT TOTAL KNEE ARTHROPLASTY RIGHT ;  Surgeon: Gearlean Alf, MD;  Location: [facility];  Service: Orthopedics;  Laterality: Right;    Family History  Problem Relation Age of Onset  . Heart disease Mother 17    MI  . Stroke Father 82  . COPD Sister   . Diabetes Sister   . Diabetes Brother     No Known Allergies  Current Outpatient Prescriptions on File Prior to Visit  Medication Sig Dispense Refill  . benazepril-hydrochlorthiazide (LOTENSIN HCT) 20-12.5 MG per tablet Take 1 tablet by mouth every morning. 90 tablet 1  . celecoxib (CELEBREX) 200 MG capsule Take 1 capsule (200 mg total) by mouth daily. 90 capsule 3  . fluticasone (FLONASE) 50 MCG/ACT nasal spray Place 1 spray into both nostrils daily. 48 g 3  . gabapentin (NEURONTIN) 600 MG tablet Take 1 tablet (  600 mg total) by mouth 3 (three) times daily. 270 tablet 1  . loratadine (CLARITIN) 10 MG tablet Take 10 mg by mouth every evening.     . potassium citrate (UROCIT-K) 10 MEQ (1080 MG) SR tablet Take 2 tablets (20 mEq total) by mouth 2 (two) times daily with a meal. 360 tablet 3  . psyllium (HYDROCIL/METAMUCIL) 95 % PACK Take 1 packet by mouth daily.    . simvastatin (ZOCOR) 40 MG tablet Take 1 tablet (40 mg total) by mouth at bedtime. 90 tablet 1   No current facility-administered medications on file prior to visit.    Pulse 71  Temp(Src) 98 F (36.7 C) (Oral)  Ht 5' 9.5" (1.765 m)  Wt 208 lb (94.348 kg)  BMI 30.29 kg/m2  SpO2 95%     Here for Medicare AWV:   1. Risk factors based on Past M, S, F history: risk factors include hypertension,  dyslipidemia, and a family history of cardiac disease  2. Physical Activities: remains quite active. He has 5 horses  and rides daily, also walks frequently  3. Depression/mood: no history of depression or mood disorder  4. Hearing: mild sensorineural hearing loss, left greater than the right  5. ADL's: independent in all aspects of daily living  6. Fall Risk: low  7. Home Safety: no problems identified  8. Height, weight, &visual acuity:height and weight normal and unchanged. No difficulty with visual acuity  9. Counseling: heart healthy diet and regular. Exercise encouraged as well as modest weight loss  10. Labs ordered based on risk factors: average her profile, including lipid profile will be reviewed  11. Referral Coordination- follow-up urology  12. Care Plan- heart healthy diet and weight loss, exercise regimen discussed and encouraged  13. Cognitive Assessment- alert and oriented normal affect. No cognitive dysfunction; handles all executive functions without difficulty  14.  Preventive services will include annual health assessments.  Patient was provided with a written and personalized care plan 15.  Provider list updated.  Includes primary care medicine, orthopedics and urology   Allergies (verified) :  No Known Drug Allergies   Past History:  Past Medical History:   Hyperlipidemia  Nephrolithiasis, hx of  Osteoarthritis  Benign prostatic hypertrophy  Allergic rhinitis  systolic hypertension  external hemorrhoids  Hypertension   Past Surgical History:   Appendectomy  Inguinal herniorrhaphy  Lithotripsy  colonoscopy 2004  ETT 1-06  Cardiolye 6-09  TURP in July 2013  Family History:   father died at 48, cerebrovascular disease  mother died at age 40, MI  Three  sisters two brothers, positive for diabetes; one sister with COPD who is deceased; one brother  died of suicide death   Social History:   Married   Quite active on a horse farm    Review of  Systems  Constitutional: Negative for fever, chills, activity change, appetite change and fatigue.  HENT: Negative for congestion, dental problem, ear pain, hearing loss, mouth sores, rhinorrhea, sinus pressure, sneezing, tinnitus, trouble swallowing and voice change.   Eyes: Negative for photophobia, pain, redness and visual disturbance.  Respiratory: Negative for apnea, cough, choking, chest tightness, shortness of breath and wheezing.   Cardiovascular: Negative for chest pain, palpitations and leg swelling.  Gastrointestinal: Negative for nausea, vomiting, abdominal pain, diarrhea, constipation, blood in stool, abdominal distention, anal bleeding and rectal pain.  Genitourinary: Negative for dysuria, urgency, frequency, hematuria, flank pain, decreased urine volume, discharge, penile swelling, scrotal swelling, difficulty urinating, genital sores and testicular pain.  Musculoskeletal: Negative for myalgias, back pain, joint swelling, arthralgias, gait problem, neck pain and neck stiffness.  Skin: Negative for color change, rash and wound.  Neurological: Negative for dizziness, tremors, seizures, syncope, facial asymmetry, speech difficulty, weakness, light-headedness, numbness and headaches.  Hematological: Negative for adenopathy. Does not bruise/bleed easily.  Psychiatric/Behavioral: Negative for suicidal ideas, hallucinations, behavioral problems, confusion, sleep disturbance, self-injury, dysphoric mood, decreased concentration and agitation. The patient is not nervous/anxious.        Objective:   Physical Exam  Constitutional: He appears well-developed and well-nourished.  HENT:  Head: Normocephalic and atraumatic.  Right Ear: External ear normal.  Left Ear: External ear normal.  Nose: Nose normal.  Mouth/Throat: Oropharynx is clear and moist.  Eyes: Conjunctivae and EOM are normal. Pupils are equal, round, and reactive to light. No scleral icterus.  Neck: Normal range of motion.  Neck supple. No JVD present. No thyromegaly present.  Cardiovascular: Regular rhythm, normal heart sounds and intact distal pulses.  Exam reveals no gallop and no friction rub.   No murmur heard. Pulmonary/Chest: Effort normal and breath sounds normal. He exhibits no tenderness.  Abdominal: Soft. Bowel sounds are normal. He exhibits no distension and no mass. There is no tenderness.  Genitourinary: Penis normal.  Musculoskeletal: Normal range of motion. He exhibits no edema or tenderness.  Right knee brace  Lymphadenopathy:    He has no cervical adenopathy.  Neurological: He is alert. He has normal reflexes. No cranial nerve deficit. Coordination normal.  Skin: Skin is warm and dry. No rash noted.  Psychiatric: He has a normal mood and affect. His behavior is normal.          Assessment & Plan:    Preventive health examination Hypertension well controlled Dyslipidemia well controlled. We'll continue simvastatin therapy BPH status post TURP.  Patient wishes a trial of daily Cialis.  Neurology felt this might be helpful due to his urinary frequency  Recheck 6 months Urology followup as scheduled Laboratory profile reviewed

## 2014-09-13 NOTE — Progress Notes (Signed)
Pre visit review using our clinic review tool, if applicable. No additional management support is needed unless otherwise documented below in the visit note. 

## 2014-09-13 NOTE — Patient Instructions (Signed)
Limit your sodium (Salt) intake  Please check your blood pressure on a regular basis.  If it is consistently greater than 150/90, please make an office appointment.    It is important that you exercise regularly, at least 20 minutes 3 to 4 times per week.  If you develop chest pain or shortness of breath seek  medical attention.  Return in 6 months for follow-up  Health Maintenance A healthy lifestyle and preventative care can promote health and wellness.  Maintain regular health, dental, and eye exams.  Eat a healthy diet. Foods like vegetables, fruits, whole grains, low-fat dairy products, and lean protein foods contain the nutrients you need and are low in calories. Decrease your intake of foods high in solid fats, added sugars, and salt. Get information about a proper diet from your health care provider, if necessary.  Regular physical exercise is one of the most important things you can do for your health. Most adults should get at least 150 minutes of moderate-intensity exercise (any activity that increases your heart rate and causes you to sweat) each week. In addition, most adults need muscle-strengthening exercises on 2 or more days a week.   Maintain a healthy weight. The body mass index (BMI) is a screening tool to identify possible weight problems. It provides an estimate of body fat based on height and weight. Your health care provider can find your BMI and can help you achieve or maintain a healthy weight. For males 20 years and older:  A BMI below 18.5 is considered underweight.  A BMI of 18.5 to 24.9 is normal.  A BMI of 25 to 29.9 is considered overweight.  A BMI of 30 and above is considered obese.  Maintain normal blood lipids and cholesterol by exercising and minimizing your intake of saturated fat. Eat a balanced diet with plenty of fruits and vegetables. Blood tests for lipids and cholesterol should begin at age 79 and be repeated every 5 years. If your lipid or  cholesterol levels are high, you are over age 72, or you are at high risk for heart disease, you may need your cholesterol levels checked more frequently.Ongoing high lipid and cholesterol levels should be treated with medicines if diet and exercise are not working.  If you smoke, find out from your health care provider how to quit. If you do not use tobacco, do not start.  Lung cancer screening is recommended for adults aged 15-80 years who are at high risk for developing lung cancer because of a history of smoking. A yearly low-dose CT scan of the lungs is recommended for people who have at least a 30-pack-year history of smoking and are current smokers or have quit within the past 15 years. A pack year of smoking is smoking an average of 1 pack of cigarettes a day for 1 year (for example, a 30-pack-year history of smoking could mean smoking 1 pack a day for 30 years or 2 packs a day for 15 years). Yearly screening should continue until the smoker has stopped smoking for at least 15 years. Yearly screening should be stopped for people who develop a health problem that would prevent them from having lung cancer treatment.  If you choose to drink alcohol, do not have more than 2 drinks per day. One drink is considered to be 12 oz (360 mL) of beer, 5 oz (150 mL) of wine, or 1.5 oz (45 mL) of liquor.  Avoid the use of street drugs. Do not share needles  with anyone. Ask for help if you need support or instructions about stopping the use of drugs.  High blood pressure causes heart disease and increases the risk of stroke. Blood pressure should be checked at least every 1-2 years. Ongoing high blood pressure should be treated with medicines if weight loss and exercise are not effective.  If you are 4-61 years old, ask your health care provider if you should take aspirin to prevent heart disease.  Diabetes screening involves taking a blood sample to check your fasting blood sugar level. This should be done  once every 3 years after age 57 if you are at a normal weight and without risk factors for diabetes. Testing should be considered at a younger age or be carried out more frequently if you are overweight and have at least 1 risk factor for diabetes.  Colorectal cancer can be detected and often prevented. Most routine colorectal cancer screening begins at the age of 70 and continues through age 2. However, your health care provider may recommend screening at an earlier age if you have risk factors for colon cancer. On a yearly basis, your health care provider may provide home test kits to check for hidden blood in the stool. A small camera at the end of a tube may be used to directly examine the colon (sigmoidoscopy or colonoscopy) to detect the earliest forms of colorectal cancer. Talk to your health care provider about this at age 94 when routine screening begins. A direct exam of the colon should be repeated every 5-10 years through age 64, unless early forms of precancerous polyps or small growths are found.  People who are at an increased risk for hepatitis B should be screened for this virus. You are considered at high risk for hepatitis B if:  You were born in a country where hepatitis B occurs often. Talk with your health care provider about which countries are considered high risk.  Your parents were born in a high-risk country and you have not received a shot to protect against hepatitis B (hepatitis B vaccine).  You have HIV or AIDS.  You use needles to inject street drugs.  You live with, or have sex with, someone who has hepatitis B.  You are a man who has sex with other men (MSM).  You get hemodialysis treatment.  You take certain medicines for conditions like cancer, organ transplantation, and autoimmune conditions.  Hepatitis C blood testing is recommended for all people born from [birth year] through 1963 and any individual with known risk factors for hepatitis C.  Healthy men should  no longer receive prostate-specific antigen (PSA) blood tests as part of routine cancer screening. Talk to your health care provider about prostate cancer screening.  Testicular cancer screening is not recommended for adolescents or adult males who have no symptoms. Screening includes self-exam, a health care provider exam, and other screening tests. Consult with your health care provider about any symptoms you have or any concerns you have about testicular cancer.  Practice safe sex. Use condoms and avoid high-risk sexual practices to reduce the spread of sexually transmitted infections (STIs).  You should be screened for STIs, including gonorrhea and chlamydia if:  You are sexually active and are younger than 24 years.  You are older than 24 years, and your health care provider tells you that you are at risk for this type of infection.  Your sexual activity has changed since you were last screened, and you are at an increased risk  for chlamydia or gonorrhea. Ask your health care provider if you are at risk.  If you are at risk of being infected with HIV, it is recommended that you take a prescription medicine daily to prevent HIV infection. This is called pre-exposure prophylaxis (PrEP). You are considered at risk if:  You are a man who has sex with other men (MSM).  You are a heterosexual man who is sexually active with multiple partners.  You take drugs by injection.  You are sexually active with a partner who has HIV.  Talk with your health care provider about whether you are at high risk of being infected with HIV. If you choose to begin PrEP, you should first be tested for HIV. You should then be tested every 3 months for as long as you are taking PrEP.  Use sunscreen. Apply sunscreen liberally and repeatedly throughout the day. You should seek shade when your shadow is shorter than you. Protect yourself by wearing long sleeves, pants, a wide-brimmed hat, and sunglasses year round  whenever you are outdoors.  Tell your health care provider of new moles or changes in moles, especially if there is a change in shape or color. Also, tell your health care provider if a mole is larger than the size of a pencil eraser.  A one-time screening for abdominal aortic aneurysm (AAA) and surgical repair of large AAAs by ultrasound is recommended for men aged 71-75 years who are current or former smokers.  Stay current with your vaccines (immunizations). Document Released: 02/29/2008 Document Revised: 09/07/2013 Document Reviewed: 01/28/2011 [facility] Patient Information 2015 [place], [place]. This information is not intended to replace advice given to you by your health care provider. Make sure you discuss any questions you have with your health care provider.

## 2014-10-12 ENCOUNTER — Other Ambulatory Visit: Payer: Self-pay | Admitting: Internal Medicine

## 2014-10-14 ENCOUNTER — Telehealth: Payer: Self-pay | Admitting: *Deleted

## 2014-10-14 NOTE — Telephone Encounter (Signed)
Ulice Dash called from [facility] [phone] and stated Celebrex 200mg  has been approved from 10/12/2014-10/13/2015.

## 2014-11-15 ENCOUNTER — Telehealth: Payer: Self-pay | Admitting: Internal Medicine

## 2014-11-15 MED ORDER — CELECOXIB 200 MG PO CAPS: 200.0000 mg | ORAL_CAPSULE | Freq: Every day | ORAL | Status: DC

## 2014-11-15 NOTE — Telephone Encounter (Signed)
Spoke to pt, told him he already had his Shingle vaccine in 2012, and I will have Rx for Celebrex ready for pickup today. Pt verbalized understanding. Rx printed and signed.

## 2014-11-15 NOTE — Telephone Encounter (Signed)
Pt needs new rxs one  for shingle vaccine and celebrex 200 mg #90 w/Refills. Pt wife will pick up rxs

## 2015-01-13 ENCOUNTER — Other Ambulatory Visit: Payer: Self-pay | Admitting: Internal Medicine

## 2015-01-23 ENCOUNTER — Encounter (HOSPITAL_COMMUNITY): Payer: Self-pay

## 2015-01-23 ENCOUNTER — Encounter (HOSPITAL_COMMUNITY)
Admission: RE | Admit: 2015-01-23 | Discharge: 2015-01-23 | Disposition: A | Discharge status: Home / Self Care | Payer: Medicare Other | Source: Ambulatory Visit | Attending: Orthopedic Surgery | Admitting: Orthopedic Surgery

## 2015-01-23 DIAGNOSIS — Z01810 Encounter for preprocedural cardiovascular examination: Secondary | ICD-10-CM | POA: Diagnosis not present

## 2015-01-23 DIAGNOSIS — Z01812 Encounter for preprocedural laboratory examination: Secondary | ICD-10-CM | POA: Diagnosis not present

## 2015-01-23 DIAGNOSIS — I10 Essential (primary) hypertension: Secondary | ICD-10-CM | POA: Insufficient documentation

## 2015-01-23 HISTORY — DX: Headache: R51

## 2015-01-23 HISTORY — DX: Headache, unspecified: R51.9

## 2015-01-23 HISTORY — DX: Pneumonia, unspecified organism: J18.9

## 2015-01-23 LAB — CBC WITH DIFFERENTIAL/PLATELET
BASOS ABS: 0.1 10*3/uL (ref 0.0–0.1)
BASOS PCT: 1 % (ref 0–1)
EOS ABS: 0.2 10*3/uL (ref 0.0–0.7)
EOS PCT: 3 % (ref 0–5)
HCT: 42 % (ref 39.0–52.0)
Hemoglobin: 14.2 g/dL (ref 13.0–17.0)
LYMPHS ABS: 1.4 10*3/uL (ref 0.7–4.0)
Lymphocytes Relative: 20 % (ref 12–46)
MCH: 30.3 pg (ref 26.0–34.0)
MCHC: 33.8 g/dL (ref 30.0–36.0)
MCV: 89.7 fL (ref 78.0–100.0)
Monocytes Absolute: 0.4 10*3/uL (ref 0.1–1.0)
Monocytes Relative: 6 % (ref 3–12)
NEUTROS PCT: 70 % (ref 43–77)
Neutro Abs: 5 10*3/uL (ref 1.7–7.7)
PLATELETS: 156 10*3/uL (ref 150–400)
RBC: 4.68 MIL/uL (ref 4.22–5.81)
RDW: 13.6 % (ref 11.5–15.5)
WBC: 7.1 10*3/uL (ref 4.0–10.5)

## 2015-01-23 LAB — APTT: aPTT: 26 seconds (ref 24–37)

## 2015-01-23 LAB — COMPREHENSIVE METABOLIC PANEL
ALT: 26 U/L (ref 17–63)
AST: 25 U/L (ref 15–41)
Albumin: 4.3 g/dL (ref 3.5–5.0)
Alkaline Phosphatase: 58 U/L (ref 38–126)
Anion gap: 8 (ref 5–15)
BILIRUBIN TOTAL: 1.1 mg/dL (ref 0.3–1.2)
BUN: 30 mg/dL — ABNORMAL HIGH (ref 6–20)
CHLORIDE: 106 mmol/L (ref 101–111)
CO2: 24 mmol/L (ref 22–32)
Calcium: 9.5 mg/dL (ref 8.9–10.3)
Creatinine, Ser: 0.74 mg/dL (ref 0.61–1.24)
GFR calc non Af Amer: >60 mL/min (ref >60)
GLUCOSE: 110 mg/dL — AB (ref 70–99)
Potassium: 4.1 mmol/L (ref 3.5–5.1)
SODIUM: 138 mmol/L (ref 135–145)
Total Protein: 6.7 g/dL (ref 6.5–8.1)

## 2015-01-23 LAB — PROTIME-INR
INR: 0.98 (ref 0.00–1.49)
Prothrombin Time: 13.1 seconds (ref 11.6–15.2)

## 2015-01-23 LAB — SURGICAL PCR SCREEN
MRSA, PCR: NEGATIVE
STAPHYLOCOCCUS AUREUS: NEGATIVE

## 2015-01-23 NOTE — Progress Notes (Signed)
Pt denies SOB, chest pain, and being under the care of a cardiologist. Pt denies having an echo and cardiac cath but stated that a stress test was performed over 7 years ago. Pt PCP is Dr. Burnice Logan.

## 2015-01-23 NOTE — Pre-Procedure Instructions (Signed)
FIN HUPP  01/23/2015   Your procedure is scheduled on: Thursday, Feb 02, 2015  Report to [facility] Admitting at 5:30 AM.  Call this number if you have problems the morning of surgery: [phone]   Remember:   Do not eat food or drink liquids after midnight Wednesday, Feb 01, 2015   Take these medicines the morning of surgery with A SIP OF WATER: gabapentin (NEURONTIN) fluticasone (FLONASE)  nasal spray  Stop taking Aspirin, over the counter vitamins and herbal medications such as Krill Oil, Coenzyme Q10 (CO Q 10 ),GLUCOSAMINE CHONDR, Methylsulfonylmethane ( MSM)  . Do not take any NSAIDs ie: Ibuprofen, Advil, Naproxen or any medication containing Aspirin such as celecoxib (CELEBREX); stop 1 week prior to procedure (Thursday, Jan 26, 2015).   Do not wear jewelry, make-up or nail polish.  Do not wear lotions, powders, or perfumes. You may not  wear deodorant.  Do not shave 48 hours prior to surgery. Men may shave face and neck.  Do not bring valuables to the hospital.  [facility] is not responsible for any belongings or valuables.               Contacts, dentures or bridgework may not be worn into surgery.  Leave suitcase in the car. After surgery it may be brought to your room.  For patients admitted to the hospital, discharge time is determined by your treatment team.               Patients discharged the day of surgery will not be allowed to drive home.  Name and phone number of your driver:   Special Instructions:  Special Instructions:Special Instructions: [facility] - Preparing for Surgery  Before surgery, you can play an important role.  Because skin is not sterile, your skin needs to be as free of germs as possible.  You can reduce the number of germs on you skin by washing with CHG (chlorahexidine gluconate) soap before surgery.  CHG is an antiseptic cleaner which kills germs and bonds with the skin to continue killing germs even after washing.  Please DO  NOT use if you have an allergy to CHG or antibacterial soaps.  If your skin becomes reddened/irritated stop using the CHG and inform your nurse when you arrive at Short Stay.  Do not shave (including legs and underarms) for at least 48 hours prior to the first CHG shower.  You may shave your face.  Please follow these instructions carefully:   1.  Shower with CHG Soap the night before surgery and the morning of Surgery.  2.  If you choose to wash your hair, wash your hair first as usual with your normal shampoo.  3.  After you shampoo, rinse your hair and body thoroughly to remove the Shampoo.  4.  Use CHG as you would any other liquid soap.  You can apply chg directly  to the skin and wash gently with scrungie or a clean washcloth.  5.  Apply the CHG Soap to your body ONLY FROM THE NECK DOWN.  Do not use on open wounds or open sores.  Avoid contact with your eyes, ears, mouth and genitals (private parts).  Wash genitals (private parts) with your normal soap.  6.  Wash thoroughly, paying special attention to the area where your surgery will be performed.  7.  Thoroughly rinse your body with warm water from the neck down.  8.  DO NOT shower/wash with your normal soap  after using and rinsing off the CHG Soap.  9.  Pat yourself dry with a clean towel.            10.  Wear clean pajamas.            11.  Place clean sheets on your bed the night of your first shower and do not sleep with pets.  Day of Surgery  Do not apply any lotions/deodorants the morning of surgery.  Please wear clean clothes to the hospital/surgery center.   Please read over the following fact sheets that you were given: Pain Booklet, Coughing and Deep Breathing, MRSA Information and Surgical Site Infection Prevention

## 2015-02-01 MED ORDER — CEFAZOLIN SODIUM-DEXTROSE 2-3 GM-% IV SOLR
2.0000 g | INTRAVENOUS | Status: AC
  Administered 2015-02-02: 2 g via INTRAVENOUS

## 2015-02-02 ENCOUNTER — Inpatient Hospital Stay (HOSPITAL_COMMUNITY): Payer: Medicare Other | Admitting: Anesthesiology

## 2015-02-02 ENCOUNTER — Encounter (HOSPITAL_COMMUNITY): Payer: Self-pay | Admitting: *Deleted

## 2015-02-02 ENCOUNTER — Inpatient Hospital Stay (HOSPITAL_COMMUNITY)
Admission: RE | Admit: 2015-02-02 | Discharge: 2015-02-03 | DRG: 483 | Disposition: A | Discharge status: Home / Self Care | Payer: Medicare Other | Source: Ambulatory Visit | Attending: Orthopedic Surgery | Admitting: Orthopedic Surgery

## 2015-02-02 ENCOUNTER — Encounter (HOSPITAL_COMMUNITY)
Admission: RE | Disposition: A | Discharge status: Home / Self Care | Payer: Self-pay | Source: Ambulatory Visit | Attending: Orthopedic Surgery

## 2015-02-02 DIAGNOSIS — Z833 Family history of diabetes mellitus: Secondary | ICD-10-CM

## 2015-02-02 DIAGNOSIS — Z8249 Family history of ischemic heart disease and other diseases of the circulatory system: Secondary | ICD-10-CM

## 2015-02-02 DIAGNOSIS — H9192 Unspecified hearing loss, left ear: Secondary | ICD-10-CM | POA: Diagnosis present

## 2015-02-02 DIAGNOSIS — E785 Hyperlipidemia, unspecified: Secondary | ICD-10-CM | POA: Diagnosis present

## 2015-02-02 DIAGNOSIS — Z96619 Presence of unspecified artificial shoulder joint: Secondary | ICD-10-CM

## 2015-02-02 DIAGNOSIS — Z823 Family history of stroke: Secondary | ICD-10-CM

## 2015-02-02 DIAGNOSIS — I10 Essential (primary) hypertension: Secondary | ICD-10-CM | POA: Diagnosis present

## 2015-02-02 DIAGNOSIS — Z825 Family history of asthma and other chronic lower respiratory diseases: Secondary | ICD-10-CM | POA: Diagnosis not present

## 2015-02-02 DIAGNOSIS — K219 Gastro-esophageal reflux disease without esophagitis: Secondary | ICD-10-CM | POA: Diagnosis present

## 2015-02-02 DIAGNOSIS — M19012 Primary osteoarthritis, left shoulder: Principal | ICD-10-CM | POA: Diagnosis present

## 2015-02-02 DIAGNOSIS — M25512 Pain in left shoulder: Secondary | ICD-10-CM | POA: Diagnosis present

## 2015-02-02 DIAGNOSIS — Z96651 Presence of right artificial knee joint: Secondary | ICD-10-CM | POA: Diagnosis present

## 2015-02-02 DIAGNOSIS — Z96611 Presence of right artificial shoulder joint: Secondary | ICD-10-CM | POA: Diagnosis present

## 2015-02-02 DIAGNOSIS — G629 Polyneuropathy, unspecified: Secondary | ICD-10-CM | POA: Diagnosis present

## 2015-02-02 HISTORY — DX: Presence of unspecified artificial shoulder joint: Z96.619

## 2015-02-02 HISTORY — PX: TOTAL SHOULDER ARTHROPLASTY: SHX126

## 2015-02-02 SURGERY — ARTHROPLASTY, SHOULDER, TOTAL
Anesthesia: General | Site: Shoulder | Laterality: Left

## 2015-02-02 MED ORDER — OXYCODONE HCL 5 MG PO TABS
5.0000 mg | ORAL_TABLET | ORAL | Status: DC | PRN
  Administered 2015-02-03 (×3): 10 mg via ORAL
  Filled 2015-02-02 (×3): qty 2

## 2015-02-02 MED ORDER — SIMVASTATIN 40 MG PO TABS
40.0000 mg | ORAL_TABLET | Freq: Every day | ORAL | Status: DC
  Administered 2015-02-02: 40 mg via ORAL
  Filled 2015-02-02: qty 1

## 2015-02-02 MED ORDER — ROCURONIUM BROMIDE 50 MG/5ML IV SOLN
INTRAVENOUS | Status: AC
  Filled 2015-02-02: qty 1

## 2015-02-02 MED ORDER — DIPHENHYDRAMINE HCL 12.5 MG/5ML PO ELIX: 12.5000 mg | ORAL_SOLUTION | ORAL | Status: DC | PRN

## 2015-02-02 MED ORDER — BISACODYL 5 MG PO TBEC: 5.0000 mg | DELAYED_RELEASE_TABLET | Freq: Every day | ORAL | Status: DC | PRN

## 2015-02-02 MED ORDER — POTASSIUM CITRATE ER 10 MEQ (1080 MG) PO TBCR
20.0000 meq | EXTENDED_RELEASE_TABLET | Freq: Two times a day (BID) | ORAL | Status: DC
  Administered 2015-02-02 – 2015-02-03 (×2): 20 meq via ORAL
  Filled 2015-02-02 (×5): qty 2

## 2015-02-02 MED ORDER — NEOSTIGMINE METHYLSULFATE 10 MG/10ML IV SOLN
INTRAVENOUS | Status: AC
  Filled 2015-02-02: qty 1

## 2015-02-02 MED ORDER — GLYCOPYRROLATE 0.2 MG/ML IJ SOLN
INTRAMUSCULAR | Status: DC | PRN
  Administered 2015-02-02: .4 mg via INTRAVENOUS
  Administered 2015-02-02: .2 mg via INTRAVENOUS

## 2015-02-02 MED ORDER — BENAZEPRIL-HYDROCHLOROTHIAZIDE 20-12.5 MG PO TABS: 1.0000 | ORAL_TABLET | Freq: Every day | ORAL | Status: DC

## 2015-02-02 MED ORDER — EPHEDRINE SULFATE 50 MG/ML IJ SOLN
INTRAMUSCULAR | Status: DC | PRN
  Administered 2015-02-02: 5 mg via INTRAVENOUS

## 2015-02-02 MED ORDER — ACETAMINOPHEN 325 MG PO TABS: 650.0000 mg | ORAL_TABLET | Freq: Four times a day (QID) | ORAL | Status: DC | PRN

## 2015-02-02 MED ORDER — KETOROLAC TROMETHAMINE 15 MG/ML IJ SOLN
7.5000 mg | Freq: Four times a day (QID) | INTRAMUSCULAR | Status: DC
  Administered 2015-02-02 – 2015-02-03 (×3): 7.5 mg via INTRAVENOUS
  Filled 2015-02-02 (×7): qty 1

## 2015-02-02 MED ORDER — CHLORHEXIDINE GLUCONATE 4 % EX LIQD: 60.0000 mL | Freq: Once | CUTANEOUS | Status: DC

## 2015-02-02 MED ORDER — HYDROMORPHONE HCL 1 MG/ML IJ SOLN: 0.5000 mg | INTRAMUSCULAR | Status: DC | PRN

## 2015-02-02 MED ORDER — METOCLOPRAMIDE HCL 5 MG PO TABS: 5.0000 mg | ORAL_TABLET | Freq: Three times a day (TID) | ORAL | Status: DC | PRN

## 2015-02-02 MED ORDER — ARTIFICIAL TEARS OP OINT
TOPICAL_OINTMENT | OPHTHALMIC | Status: AC
  Filled 2015-02-02: qty 3.5

## 2015-02-02 MED ORDER — PHENYLEPHRINE 40 MCG/ML (10ML) SYRINGE FOR IV PUSH (FOR BLOOD PRESSURE SUPPORT)
PREFILLED_SYRINGE | INTRAVENOUS | Status: AC
  Filled 2015-02-02: qty 10

## 2015-02-02 MED ORDER — POLYETHYLENE GLYCOL 3350 17 G PO PACK
17.0000 g | PACK | Freq: Every day | ORAL | Status: DC | PRN
  Administered 2015-02-03: 17 g via ORAL
  Filled 2015-02-02: qty 1

## 2015-02-02 MED ORDER — MAGNESIUM CITRATE PO SOLN: 1.0000 | Freq: Once | ORAL | Status: AC | PRN

## 2015-02-02 MED ORDER — MIDAZOLAM HCL 5 MG/5ML IJ SOLN
INTRAMUSCULAR | Status: DC | PRN
  Administered 2015-02-02: 1 mg via INTRAVENOUS

## 2015-02-02 MED ORDER — PHENOL 1.4 % MT LIQD: 1.0000 | OROMUCOSAL | Status: DC | PRN

## 2015-02-02 MED ORDER — ALUM & MAG HYDROXIDE-SIMETH 200-200-20 MG/5ML PO SUSP: 30.0000 mL | ORAL | Status: DC | PRN

## 2015-02-02 MED ORDER — LACTATED RINGERS IV SOLN: INTRAVENOUS | Status: DC

## 2015-02-02 MED ORDER — ONDANSETRON HCL 4 MG/2ML IJ SOLN: 4.0000 mg | Freq: Four times a day (QID) | INTRAMUSCULAR | Status: DC | PRN

## 2015-02-02 MED ORDER — SCOPOLAMINE 1 MG/3DAYS TD PT72
MEDICATED_PATCH | TRANSDERMAL | Status: AC
  Administered 2015-02-02: 1 via TRANSDERMAL
  Filled 2015-02-02: qty 1

## 2015-02-02 MED ORDER — LIDOCAINE HCL (CARDIAC) 20 MG/ML IV SOLN
INTRAVENOUS | Status: AC
  Filled 2015-02-02: qty 5

## 2015-02-02 MED ORDER — DIAZEPAM 5 MG PO TABS: 5.0000 mg | ORAL_TABLET | Freq: Four times a day (QID) | ORAL | Status: DC | PRN

## 2015-02-02 MED ORDER — METOCLOPRAMIDE HCL 5 MG/ML IJ SOLN: 5.0000 mg | Freq: Three times a day (TID) | INTRAMUSCULAR | Status: DC | PRN

## 2015-02-02 MED ORDER — FENTANYL CITRATE (PF) 100 MCG/2ML IJ SOLN: 25.0000 ug | INTRAMUSCULAR | Status: DC | PRN

## 2015-02-02 MED ORDER — PHENYLEPHRINE HCL 10 MG/ML IJ SOLN
10.0000 mg | INTRAVENOUS | Status: DC | PRN
  Administered 2015-02-02: 10 ug/min via INTRAVENOUS

## 2015-02-02 MED ORDER — DIAZEPAM 5 MG PO TABS
ORAL_TABLET | ORAL | Status: AC
  Filled 2015-02-02: qty 1

## 2015-02-02 MED ORDER — LACTATED RINGERS IV SOLN
INTRAVENOUS | Status: DC | PRN
  Administered 2015-02-02 (×2): via INTRAVENOUS

## 2015-02-02 MED ORDER — LACTATED RINGERS IV SOLN
INTRAVENOUS | Status: DC
  Administered 2015-02-02: 75 mL/h via INTRAVENOUS

## 2015-02-02 MED ORDER — PROPOFOL 10 MG/ML IV BOLUS
INTRAVENOUS | Status: AC
  Filled 2015-02-02: qty 20

## 2015-02-02 MED ORDER — HYPROMELLOSE (GONIOSCOPIC) 2.5 % OP SOLN
1.0000 [drp] | OPHTHALMIC | Status: DC | PRN
  Filled 2015-02-02: qty 15

## 2015-02-02 MED ORDER — ONDANSETRON HCL 4 MG/2ML IJ SOLN
INTRAMUSCULAR | Status: AC
  Filled 2015-02-02: qty 2

## 2015-02-02 MED ORDER — EPHEDRINE SULFATE 50 MG/ML IJ SOLN
INTRAMUSCULAR | Status: AC
  Filled 2015-02-02: qty 1

## 2015-02-02 MED ORDER — CEFAZOLIN SODIUM-DEXTROSE 2-3 GM-% IV SOLR
2.0000 g | Freq: Four times a day (QID) | INTRAVENOUS | Status: AC
  Administered 2015-02-02 – 2015-02-03 (×3): 2 g via INTRAVENOUS
  Filled 2015-02-02 (×3): qty 50

## 2015-02-02 MED ORDER — GLYCOPYRROLATE 0.2 MG/ML IJ SOLN
INTRAMUSCULAR | Status: AC
  Filled 2015-02-02: qty 2

## 2015-02-02 MED ORDER — 0.9 % SODIUM CHLORIDE (POUR BTL) OPTIME
TOPICAL | Status: DC | PRN
  Administered 2015-02-02: 1000 mL

## 2015-02-02 MED ORDER — SODIUM CHLORIDE 0.9 % IJ SOLN
INTRAMUSCULAR | Status: AC
  Filled 2015-02-02: qty 10

## 2015-02-02 MED ORDER — HYDROCHLOROTHIAZIDE 25 MG PO TABS
25.0000 mg | ORAL_TABLET | Freq: Every day | ORAL | Status: DC
  Administered 2015-02-03: 25 mg via ORAL
  Filled 2015-02-02: qty 1

## 2015-02-02 MED ORDER — ONDANSETRON HCL 4 MG PO TABS: 4.0000 mg | ORAL_TABLET | Freq: Four times a day (QID) | ORAL | Status: DC | PRN

## 2015-02-02 MED ORDER — KETOROLAC TROMETHAMINE 15 MG/ML IJ SOLN
INTRAMUSCULAR | Status: AC
  Filled 2015-02-02: qty 1

## 2015-02-02 MED ORDER — DIAZEPAM 5 MG PO TABS
5.0000 mg | ORAL_TABLET | Freq: Four times a day (QID) | ORAL | Status: DC | PRN
  Administered 2015-02-02 (×2): 5 mg via ORAL
  Filled 2015-02-02: qty 1

## 2015-02-02 MED ORDER — BENAZEPRIL HCL 20 MG PO TABS
20.0000 mg | ORAL_TABLET | Freq: Every day | ORAL | Status: DC
  Administered 2015-02-03: 20 mg via ORAL
  Filled 2015-02-02: qty 1

## 2015-02-02 MED ORDER — ASPIRIN 81 MG PO CHEW
81.0000 mg | CHEWABLE_TABLET | Freq: Every day | ORAL | Status: DC
  Administered 2015-02-02 – 2015-02-03 (×2): 81 mg via ORAL
  Filled 2015-02-02 (×3): qty 1

## 2015-02-02 MED ORDER — GABAPENTIN 600 MG PO TABS
600.0000 mg | ORAL_TABLET | Freq: Three times a day (TID) | ORAL | Status: DC
  Administered 2015-02-02 – 2015-02-03 (×2): 600 mg via ORAL
  Filled 2015-02-02 (×5): qty 1

## 2015-02-02 MED ORDER — OXYCODONE-ACETAMINOPHEN 5-325 MG PO TABS: 1.0000 | ORAL_TABLET | ORAL | Status: DC | PRN

## 2015-02-02 MED ORDER — PROPOFOL 10 MG/ML IV BOLUS
INTRAVENOUS | Status: DC | PRN
  Administered 2015-02-02: 90 mg via INTRAVENOUS

## 2015-02-02 MED ORDER — DOCUSATE SODIUM 100 MG PO CAPS
100.0000 mg | ORAL_CAPSULE | Freq: Two times a day (BID) | ORAL | Status: DC
  Administered 2015-02-02 – 2015-02-03 (×2): 100 mg via ORAL
  Filled 2015-02-02 (×2): qty 1

## 2015-02-02 MED ORDER — FENTANYL CITRATE (PF) 250 MCG/5ML IJ SOLN
INTRAMUSCULAR | Status: AC
Start: 2015-02-02 — End: 2015-02-02
  Filled 2015-02-02: qty 5

## 2015-02-02 MED ORDER — NEOSTIGMINE METHYLSULFATE 10 MG/10ML IV SOLN
INTRAVENOUS | Status: DC | PRN
  Administered 2015-02-02: 3 mg via INTRAVENOUS

## 2015-02-02 MED ORDER — MIDAZOLAM HCL 2 MG/2ML IJ SOLN
INTRAMUSCULAR | Status: AC
  Filled 2015-02-02: qty 2

## 2015-02-02 MED ORDER — DEXAMETHASONE SODIUM PHOSPHATE 10 MG/ML IJ SOLN
INTRAMUSCULAR | Status: DC | PRN
  Administered 2015-02-02: 4 mg via INTRAVENOUS

## 2015-02-02 MED ORDER — BUPIVACAINE-EPINEPHRINE (PF) 0.5% -1:200000 IJ SOLN
INTRAMUSCULAR | Status: DC | PRN
  Administered 2015-02-02: 30 mL via PERINEURAL

## 2015-02-02 MED ORDER — MENTHOL 3 MG MT LOZG: 1.0000 | LOZENGE | OROMUCOSAL | Status: DC | PRN

## 2015-02-02 MED ORDER — LIDOCAINE HCL (CARDIAC) 20 MG/ML IV SOLN
INTRAVENOUS | Status: DC | PRN
  Administered 2015-02-02: 30 mg via INTRAVENOUS

## 2015-02-02 MED ORDER — SUCCINYLCHOLINE CHLORIDE 20 MG/ML IJ SOLN
INTRAMUSCULAR | Status: AC
  Filled 2015-02-02: qty 1

## 2015-02-02 MED ORDER — FENTANYL CITRATE (PF) 100 MCG/2ML IJ SOLN
INTRAMUSCULAR | Status: DC | PRN
  Administered 2015-02-02: 50 ug via INTRAVENOUS
  Administered 2015-02-02: 200 ug via INTRAVENOUS

## 2015-02-02 MED ORDER — ROCURONIUM BROMIDE 100 MG/10ML IV SOLN
INTRAVENOUS | Status: DC | PRN
  Administered 2015-02-02: 50 mg via INTRAVENOUS

## 2015-02-02 MED ORDER — ACETAMINOPHEN 650 MG RE SUPP: 650.0000 mg | Freq: Four times a day (QID) | RECTAL | Status: DC | PRN

## 2015-02-02 SURGICAL SUPPLY — 69 items
ADH SKN CLS LQ APL DERMABOND (GAUZE/BANDAGES/DRESSINGS) ×1
BLADE SAW SGTL 83.5X18.5 (BLADE) ×2 IMPLANT
CAPT SHLDR TOTAL 2 ×1 IMPLANT
CEMENT HV SMART SET (Cement) ×1 IMPLANT
COVER SURGICAL LIGHT HANDLE (MISCELLANEOUS) ×2 IMPLANT
DERMABOND ADHESIVE PROPEN (GAUZE/BANDAGES/DRESSINGS) ×1
DERMABOND ADVANCED .7 DNX6 (GAUZE/BANDAGES/DRESSINGS) ×1 IMPLANT
DRAPE IMP U-DRAPE 54X76 (DRAPES) ×2 IMPLANT
DRAPE INCISE IOBAN 66X45 STRL (DRAPES) ×2 IMPLANT
DRAPE ORTHO SPLIT 77X108 STRL (DRAPES) ×4
DRAPE SURG 17X11 SM STRL (DRAPES) ×2 IMPLANT
DRAPE SURG ORHT 6 SPLT 77X108 (DRAPES) ×2 IMPLANT
DRAPE U-SHAPE 47X51 STRL (DRAPES) ×2 IMPLANT
DRESSING AQUACEL AQ EXTRA 4X5 (GAUZE/BANDAGES/DRESSINGS) ×2 IMPLANT
DRILL BIT 7/64X5 (BIT) IMPLANT
DRSG AQUACEL AG ADV 3.5X10 (GAUZE/BANDAGES/DRESSINGS) ×2 IMPLANT
DURAPREP 26ML APPLICATOR (WOUND CARE) ×4 IMPLANT
ELECT BLADE 4.0 EZ CLEAN MEGAD (MISCELLANEOUS) ×2
ELECT CAUTERY BLADE 6.4 (BLADE) ×2 IMPLANT
ELECT REM PT RETURN 9FT ADLT (ELECTROSURGICAL) ×2
ELECTRODE BLDE 4.0 EZ CLN MEGD (MISCELLANEOUS) ×1 IMPLANT
ELECTRODE REM PT RTRN 9FT ADLT (ELECTROSURGICAL) ×1 IMPLANT
FACESHIELD WRAPAROUND (MASK) ×6 IMPLANT
FACESHIELD WRAPAROUND OR TEAM (MASK) ×3 IMPLANT
GLOVE BIO SURGEON STRL SZ7 (GLOVE) ×1 IMPLANT
GLOVE BIO SURGEON STRL SZ7.5 (GLOVE) ×2 IMPLANT
GLOVE BIO SURGEON STRL SZ8 (GLOVE) ×2 IMPLANT
GLOVE BIOGEL PI IND STRL 6.5 (GLOVE) IMPLANT
GLOVE BIOGEL PI INDICATOR 6.5 (GLOVE) ×2
GLOVE EUDERMIC 7 POWDERFREE (GLOVE) ×2 IMPLANT
GLOVE SS BIOGEL STRL SZ 7.5 (GLOVE) ×1 IMPLANT
GLOVE SUPERSENSE BIOGEL SZ 7.5 (GLOVE) ×1
GLOVE SURG SS PI 6.0 STRL IVOR (GLOVE) ×1 IMPLANT
GOWN STRL REUS W/ TWL LRG LVL3 (GOWN DISPOSABLE) ×1 IMPLANT
GOWN STRL REUS W/ TWL XL LVL3 (GOWN DISPOSABLE) ×2 IMPLANT
GOWN STRL REUS W/TWL LRG LVL3 (GOWN DISPOSABLE) ×8
GOWN STRL REUS W/TWL XL LVL3 (GOWN DISPOSABLE) ×4
KIT BASIN OR (CUSTOM PROCEDURE TRAY) ×2 IMPLANT
KIT ROOM TURNOVER OR (KITS) ×2 IMPLANT
MANIFOLD NEPTUNE II (INSTRUMENTS) ×2 IMPLANT
NDL HYPO 25GX1X1/2 BEV (NEEDLE) IMPLANT
NDL SUT 6 .5 CRC .975X.05 MAYO (NEEDLE) ×1 IMPLANT
NEEDLE HYPO 25GX1X1/2 BEV (NEEDLE) IMPLANT
NEEDLE MAYO TAPER (NEEDLE) ×2
NS IRRIG 1000ML POUR BTL (IV SOLUTION) ×2 IMPLANT
PACK SHOULDER (CUSTOM PROCEDURE TRAY) ×2 IMPLANT
PAD ARMBOARD 7.5X6 YLW CONV (MISCELLANEOUS) ×4 IMPLANT
PASSER SUT SWANSON 36MM LOOP (INSTRUMENTS) IMPLANT
PIN METAGLENE 2.5 (PIN) ×1 IMPLANT
SLING ARM FOAM STRAP LRG (SOFTGOODS) ×1 IMPLANT
SLING ARM LRG ADULT FOAM STRAP (SOFTGOODS) IMPLANT
SLING ARM XL FOAM STRAP (SOFTGOODS) ×2 IMPLANT
SMARTMIX MINI TOWER (MISCELLANEOUS) ×2
SPONGE LAP 18X18 X RAY DECT (DISPOSABLE) ×2 IMPLANT
SPONGE LAP 4X18 X RAY DECT (DISPOSABLE) ×2 IMPLANT
SUCTION FRAZIER TIP 10 FR DISP (SUCTIONS) ×2 IMPLANT
SUT BONE WAX W31G (SUTURE) IMPLANT
SUT FIBERWIRE #2 38 T-5 BLUE (SUTURE) ×4
SUT MNCRL AB 3-0 PS2 18 (SUTURE) ×2 IMPLANT
SUT VIC AB 1 CT1 27 (SUTURE) ×6
SUT VIC AB 1 CT1 27XBRD ANBCTR (SUTURE) ×3 IMPLANT
SUT VIC AB 2-0 CT1 27 (SUTURE) ×4
SUT VIC AB 2-0 CT1 TAPERPNT 27 (SUTURE) ×2 IMPLANT
SUTURE FIBERWR #2 38 T-5 BLUE (SUTURE) ×2 IMPLANT
SYR CONTROL 10ML LL (SYRINGE) IMPLANT
TOWEL OR 17X24 6PK STRL BLUE (TOWEL DISPOSABLE) ×2 IMPLANT
TOWEL OR 17X26 10 PK STRL BLUE (TOWEL DISPOSABLE) ×2 IMPLANT
TOWER SMARTMIX MINI (MISCELLANEOUS) ×1 IMPLANT
WATER STERILE IRR 1000ML POUR (IV SOLUTION) ×2 IMPLANT

## 2015-02-02 NOTE — Progress Notes (Signed)
Orthopedic Tech Progress Note Patient Details:  [name] [name] 10-07-1935 [id number] Patient already has arm sling on. Patient ID: LUISALBERTO BEEGLE, male   DOB: 1936-02-22, 79 y.o.   MRN: [id number]   Braulio Bosch 02/02/2015, 7:25 PM

## 2015-02-02 NOTE — Anesthesia Procedure Notes (Addendum)
Anesthesia Regional Block:  Interscalene brachial plexus block  Pre-Anesthetic Checklist: ,, timeout performed, Correct Patient, Correct Site, Correct Laterality, Correct Procedure, Correct Position, site marked, Risks and benefits discussed,  Surgical consent,  Pre-op evaluation,  At surgeon's request and post-op pain management  Laterality: Left and Upper  Prep: chloraprep       Needles:  Injection technique: Single-shot  Needle Type: Echogenic Stimulator Needle     Needle Length: 4cm 4 cm Needle Gauge: 22 and 22 G    Additional Needles:  Procedures: nerve stimulator Interscalene brachial plexus block  Nerve Stimulator or Paresthesia:  Response: forearm twitch, 0.48 mA, 0.1 ms,   Additional Responses:   Narrative:  Start time: 02/02/2015 7:00 AM End time: 02/02/2015 7:07 AM Injection made incrementally with aspirations every 5 mL. Anesthesiologist: Annye Asa  Additional Notes: Pt identified in Holding room.  Monitors applied. Working IV access confirmed. Sterile prep L neck.  #22ga PNS to forearm twitch at 0.68mA threshold.  30cc 0.5% Bupivacaine with 1:200k epi injected incrementally after negative test dose.  Patient asymptomatic, VSS, no heme aspirated, tolerated well.  Jenita Seashore, MD   Procedure Name: Intubation Date/Time: 02/02/2015 7:59 AM Performed by: Izora Gala Pre-anesthesia Checklist: Patient identified, Emergency Drugs available, Suction available and Patient being monitored Patient Re-evaluated:Patient Re-evaluated prior to inductionOxygen Delivery Method: Circle system utilized Preoxygenation: Pre-oxygenation with 100% oxygen Intubation Type: IV induction Ventilation: Mask ventilation without difficulty Laryngoscope Size: Miller and 3 Grade View: Grade III Tube type: Oral Tube size: 7.5 mm Number of attempts: 2 Airway Equipment and Method: Stylet Placement Confirmation: ETT inserted through vocal cords under direct vision,  positive ETCO2  and breath sounds checked- equal and bilateral Secured at: 22 cm Tube secured with: Tape Dental Injury: Teeth and Oropharynx as per pre-operative assessment  Comments: Recommend Glidescope available

## 2015-02-02 NOTE — H&P (Signed)
[name] [name]    Chief Complaint: OA LEFT SHOULDER  HPI: The patient is a 79 y.o. male with end stage left shoulder OA  Past Medical History  Diagnosis Date  . HYPERLIPIDEMIA 04/23/2007  . 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 HYPERTENSION 06/02/2008  . URI 09/01/2008  . ALLERGIC RHINITIS 04/23/2007  . RECTAL BLEEDING 02/04/2008    no recent problems  . BENIGN PROSTATIC HYPERTROPHY 04/23/2007    resolved after TURP  . OSTEOARTHRITIS 04/23/2007  . Palpitations 02/04/2008  . CHEST PAIN 02/04/2008  . NAUSEA 12/29/2008    ? med related, no current problems  . NEPHROLITHIASIS, HX OF 04/23/2007    pt denies this   . Cellulitis june 2013    left side("left flank")  . PONV (postoperative nausea and vomiting)   . H/O blurred vision     both eyes, occasional  . Neuropathy of left foot   . Kidney stone   . Pneumonia   . Headache     Past Surgical History  Procedure Laterality Date  . Appendectomy    . Hernia repair    . Lithotripsy  1992    cystoscopy with stent placement also done(1 stone fragment remains)  . Right shoulder replacement  Sep 28, 2009  . Left foot surgery for fx  Jul 30, 2011  . Transurethral resection of prostate  03/27/2012    Procedure: TRANSURETHRAL RESECTION OF THE PROSTATE WITH GYRUS INSTRUMENTS;  Surgeon: Claybon Jabs, MD;  Location: [facility];  Service: Urology;;  . Total knee arthroplasty Right 08/30/2013    Procedure: RIGHT TOTAL KNEE ARTHROPLASTY RIGHT ;  Surgeon: Gearlean Alf, MD;  Location: [facility] OR[facility];  Service: Orthopedics;  Laterality: Right;  . Colonoscopy      Family History  Problem Relation Age of Onset  . Heart disease Mother 63    MI  . Stroke Father 17  . COPD Sister   . Diabetes Sister   . Diabetes Brother     Social History:  reports that he has never smoked. He has never used smokeless tobacco. He reports that he does not drink alcohol or use illicit drugs.  Allergies: No Known Allergies  Medications Prior to Admission   Medication Sig Dispense Refill  . aspirin 81 MG tablet Take 81 mg by mouth daily.    . B Complex Vitamins (VITAMIN-B COMPLEX PO) Take by mouth daily.    . benazepril-hydrochlorthiazide (LOTENSIN HCT) 20-12.5 MG per tablet TAKE 1 BY MOUTH EVERY MORNING 90 tablet 0  . celecoxib (CELEBREX) 200 MG capsule Take 1 capsule (200 mg total) by mouth daily. 90 capsule 3  . Cholecalciferol (VITAMIN D3) 5000 UNITS CAPS Take 1,000 Units by mouth daily.    . Coenzyme Q10 (CO Q 10 PO) Take 1 tablet by mouth daily.     . fluticasone (FLONASE) 50 MCG/ACT nasal spray USE 1 SPRAY INTO BOTH NOSTRILS DAILY. 32 g 1  . gabapentin (NEURONTIN) 600 MG tablet TAKE 1 BY MOUTH 3 TIMES DAILY 270 tablet 0  . Glucosamine-Chondroit-Vit C-Mn (GLUCOSAMINE CHONDR 1500 COMPLX) CAPS Take 1 capsule by mouth daily.    . hydroxypropyl methylcellulose / hypromellose (ISOPTO TEARS / GONIOVISC) 2.5 % ophthalmic solution Place 1 drop into both eyes as needed for dry eyes.    Marland Kitchen loratadine (CLARITIN) 10 MG tablet Take 10 mg by mouth every evening.     . Methylsulfonylmethane (MSM PO) Take 300 mg by mouth daily.    . Multiple  Vitamin (MULTIVITAMIN) tablet Take 1 tablet by mouth daily.    . potassium citrate (UROCIT-K) 10 MEQ (1080 MG) SR tablet TAKE 2 BY MOUTH TWICE DAILY WITH A MEAL (Patient taking differently: TAKE 20 MEQ BY MOUTH TWICE DAILY WITH A MEAL) 400 tablet 0  . psyllium (HYDROCIL/METAMUCIL) 95 % PACK Take 2 packets by mouth daily.     . simvastatin (ZOCOR) 40 MG tablet TAKE 1 BY MOUTH AT BEDTIME (Patient taking differently: TAKE 40 MG BY MOUTH AT BEDTIME) 90 tablet 0  . tadalafil (CIALIS) 5 MG tablet 1 tablet daily for BPH symptoms as well as ED (Patient taking differently: Take 5 mg by mouth daily as needed for erectile dysfunction (BPH). 1 tablet daily for BPH symptoms as well as E) 30 tablet 6     Physical Exam: left shoulder with painful and restricted motion as noted at recent office visits  Vitals  Temp:  [98.3 F (36.8  C)] 98.3 F (36.8 C) (05/19 0625) Pulse Rate:  [63-74] 72 (05/19 0728) Resp:  [17-21] 21 (05/19 0728) BP: (144-145)/(58-61) 145/59 mmHg (05/19 0727) SpO2:  [96 %-98 %] 98 % (05/19 0728) Weight:  [94.348 kg (208 lb)-94.462 kg (208 lb 4 oz)] 94.348 kg (208 lb) (05/19 0625)  Assessment/Plan  Impression: OA LEFT SHOULDER   Plan of Action: Procedure(s): LEFT TOTAL SHOULDER ARTHROPLASTY  Wanda Cellucci M 02/02/2015, 7:40 AM Contact # [phone]

## 2015-02-02 NOTE — Anesthesia Postprocedure Evaluation (Signed)
  Anesthesia Post-op Note  Patient: [name] [name]  Procedure(s) Performed: Procedure(s): LEFT TOTAL SHOULDER ARTHROPLASTY (Left)  Patient Location: PACU  Anesthesia Type:GA combined with regional for post-op pain  Level of Consciousness: awake, alert , oriented and patient cooperative  Airway and Oxygen Therapy: Patient Spontanous Breathing  Post-op Pain: none  Post-op Assessment: Post-op Vital signs reviewed, Patient's Cardiovascular Status Stable, Respiratory Function Stable, Patent Airway, No signs of Nausea or vomiting and Pain level controlled  Post-op Vital Signs: Reviewed and stable  Last Vitals:  Filed Vitals:   02/02/15 1045  BP:   Pulse: 66  Temp:   Resp: 12    Complications: No apparent anesthesia complications

## 2015-02-02 NOTE — Transfer of Care (Signed)
Immediate Anesthesia Transfer of Care Note  Patient: [name] [name]  Procedure(s) Performed: Procedure(s): LEFT TOTAL SHOULDER ARTHROPLASTY (Left)  Patient Location: PACU  Anesthesia Type:General  Level of Consciousness: awake, alert , oriented and patient cooperative  Airway & Oxygen Therapy: Patient Spontanous Breathing  Post-op Assessment: Report given to RN, Post -op Vital signs reviewed and stable and Patient moving all extremities  Post vital signs: Reviewed and stable  Last Vitals:  Filed Vitals:   02/02/15 1025  BP:   Pulse:   Temp: 36.7 C  Resp:     Complications: No apparent anesthesia complications

## 2015-02-02 NOTE — Op Note (Signed)
02/02/2015  9:56 AM  PATIENT:   [name] [name]  79 y.o. male  PRE-OPERATIVE DIAGNOSIS: end stage OA LEFT SHOULDER   POST-OPERATIVE DIAGNOSIS:  same  PROCEDURE:  L TSA #12 stem, 52x18 eccentric head, 48 glenoid  SURGEON:  Izabel Chim, Metta Clines M.D.  ASSISTANTS: Shuford pac   ANESTHESIA:   GET + ISB  EBL: 250  SPECIMEN:  none  Drains: none   PATIENT DISPOSITION:  PACU - hemodynamically stable.    PLAN OF CARE: Admit to inpatient   Dictation# [id number]   Contact # [phone]

## 2015-02-02 NOTE — Discharge Instructions (Signed)
° °  Maryah Marinaro M. Alixandria Friedt, M.D., F.A.A.O.S. °Orthopaedic Surgery °Specializing in Arthroscopic and Reconstructive °Surgery of the Shoulder and Knee °[phone] °[address] - [place], [place] [address] - Fax [phone] ° ° °POST-OP TOTAL SHOULDER REPLACEMENT/SHOULDER HEMIARTHROPLASTY INSTRUCTIONS ° °1. Call the office at [phone] to schedule your first post-op appointment 10-14 days from the date of your surgery. ° °2. The bandage over your incision is waterproof. You may begin showering with this dressing on. You may leave this dressing on until first follow up appointment within 2 weeks. If you would like to remove it you may do so after the 5th day. Go slow and tug at the borders gently to break the bond the dressing has with the skin. The steri strips may come off with the dressing. At this point if there is no drainage it is okay to go without a bandage or you may cover it with a light guaze and tape. Leave the steri-strips in place over your incision. You can expect drainage that is bloody or yellow in nature that should gradually decrease from day of surgery. Change your dressing daily until drainage is completely resolved, then you may feel free to go without a bandage. You can also expect significant bruising around your shoulder that will drift down your arm and into your chest wall. This is very normal and should resolve over several days. ° ° 3. Wear your sling/immobilizer at all times except to perform the exercises below or to occasionally let your arm dangle by your side to stretch your elbow. You also need to sleep in your sling immobilizer until instructed otherwise. ° °4. Range of motion to your elbow, wrist, and hand are encouraged 3-5 times daily. Exercise to your hand and fingers helps to reduce swelling you may experience. ° °5. Utilize ice to the shoulder 3-5 times minimum a day and additionally if you are experiencing pain. ° °6. Prescriptions for a pain medication and a muscle  relaxant are provided for you. It is recommended that if you are experiencing pain that you pain medication alone is not controlling, add the muscle relaxant along with the pain medication which can give additional pain relief. The first 1-2 days is generally the most severe of your pain and then should gradually decrease. As your pain lessens it is recommended that you decrease your use of the pain medications to an "as needed basis'" only and to always comply with the recommended dosages of the pain medications. ° °7. Pain medications can produce constipation along with their use. If you experience this, the use of an over the counter stool softener or laxative daily is recommended.  ° °8. For most patients, if insurance allows, home health services to include therapy has been arranged. ° °9. For additional questions or concerns, please do not hesitate to call the office. If after hours there is an answering service to forward your concerns to the physician on call. ° °POST-OP EXERCISES ° °Pendulum Exercises ° °Perform pendulum exercises while standing and bending at the waist. Support your uninvolved arm on a table or chair and allow your operated arm to hang freely. Make sure to do these exercises passively - not using you shoulder muscles. ° °Repeat 20 times. Do 3 sessions per day. ° ° ° ° °

## 2015-02-02 NOTE — Anesthesia Preprocedure Evaluation (Addendum)
Anesthesia Evaluation  Patient identified by MRN, date of birth, ID band Patient awake    Reviewed: Allergy & Precautions, NPO status , Patient's Chart, lab work & pertinent test results  History of Anesthesia Complications (+) PONV and history of anesthetic complications  Airway Mallampati: II  TM Distance: >3 FB Neck ROM: Full    Dental  (+) Dental Advisory Given   Pulmonary neg pulmonary ROS,  breath sounds clear to auscultation        Cardiovascular hypertension, Pt. on medications - anginaRhythm:Regular Rate:Normal     Neuro/Psych negative neurological ROS     GI/Hepatic Neg liver ROS, GERD-  Controlled,  Endo/Other  negative endocrine ROS  Renal/GU negative Renal ROS     Musculoskeletal  (+) Arthritis -, Osteoarthritis,    Abdominal   Peds  Hematology negative hematology ROS (+)   Anesthesia Other Findings   Reproductive/Obstetrics                          Anesthesia Physical Anesthesia Plan  ASA: II  Anesthesia Plan: General   Post-op Pain Management:    Induction: Intravenous  Airway Management Planned: Oral ETT  Additional Equipment:   Intra-op Plan:   Post-operative Plan: Extubation in OR  Informed Consent: I have reviewed the patients History and Physical, chart, labs and discussed the procedure including the risks, benefits and alternatives for the proposed anesthesia with the patient or authorized representative who has indicated his/her understanding and acceptance.   Dental advisory given  Plan Discussed with: CRNA and Surgeon  Anesthesia Plan Comments: (Plan routine monitors, GETA with interscalene block for post op analgesia)        Anesthesia Quick Evaluation

## 2015-02-03 NOTE — Op Note (Signed)
[name] [name], [name] [name] NO.:  [id number]  MEDICAL RECORD NO.:  [id number]  LOCATION:  5N08C                        FACILITY:  [facility]  PHYSICIAN:  [name] Clines. Makinze Jani, M.D.  DATE OF BIRTH:  1936-07-15  DATE OF PROCEDURE:  02/02/2015 DATE OF DISCHARGE:                              OPERATIVE REPORT   PREOPERATIVE DIAGNOSIS:  End-stage left shoulder osteoarthritis.  POSTOPERATIVE DIAGNOSIS:  End-stage left shoulder osteoarthritis.  PROCEDURE:  Left total shoulder arthroplasty utilizing a Press-Fit size 12 DePuy modular stem, a 52 x 18 eccentric head and a 48 glenoid cemented.  SURGEON:  [name] Clines. Loreena Valeri, MD  ASSISTANT:  Reather Laurence. Shuford, [name] [name]  ANESTHESIA:  General endotracheal as well as an interscalene block.  ESTIMATED BLOOD LOSS:  250 mL.  DRAINS:  None.  HISTORY:  [name] [name] is a 79 year old gentleman who has had chronic and progressive increasing left shoulder pain with severe restrictions in mobility and increasing functional limitations related to end-stage glenohumeral arthritis.  He is well known to our practice after previous right total shoulder arthroplasty several years ago which he had done well with, and now brought to the operating room for planned left total shoulder arthroplasty.  Preoperatively, I counseled [name] [name] regarding treatment options as well as potential risks versus benefits thereof.  Possible complications were reviewed including potential for bleeding, infection, neurovascular injury, persistent pain, loss of motion, anesthetic complication, failure of the implant, possible need for additional surgery.  He understands and accepts and agrees with our planned procedure.  PROCEDURE IN DETAIL:  After undergoing routine preop evaluation, patient received prophylactic antibiotics and an interscalene block was established in the holding area by the Anesthesia Department.  Placed supine on the operating table, underwent smooth  induction of a general endotracheal anesthesia.  Placed in beach-chair position and appropriately padded and protected.  Left shoulder girdle was then sterilely prepped and draped in standard fashion.  Time-out was called. An anterior deltopectoral approach to the left shoulder was made through a 10 cm incision.  Skin flaps were elevated and electrocautery was used for hemostasis.  Dissection carried deeply with the cephalic vein taken laterally with the deltoid.  Deltopectoral interval was then developed proximal distal in the upper cm and half of the pectoralis major, tendon was tenotomized to enhance exposure.  We then identified, mobilized conjoined tendons, was impacted medially and adhesions divided beneath the deltoid.  At this point, the biceps tendon was unroofed, tenotomized for later tenodesis and the rotator cuff was split from the apex of the bicipital groove to the base of the coracoid and the subscap was then divided away from the lesser tuberosity leaving a cm cuff of tissue for later repair.  We then divided the capsular tissues anteroinferiorly and inferiorly to allow delivery of the humeral head through the wound and there was a very large osteophyte of the humeral neck, which we excised with an osteotome and we were able to deliver the humeral head completely through the wound protecting rotator cuff superiorly and posteriorly.  We outlined our proposed humeral head resection and this was then performed using an oscillating saw maintaining the retroversion of approximately 25 degrees.  Once the head was  excised, we then used a rongeur to remove the large osteophyte which had developed anteriorly, posteriorly and inferiorly.  I then performed hand reaming of the canal up to size 12.  We broached with a size 12 with appropriate fit and fixation maintaining native retroversion.  Once this was completed, a metal cap was placed at the cut surface of proximal humerus.  We  then exposed the glenoid with a combination of Fukuda, snake tongue and pitchfork retractors.  The proximal biceps and the labrum were excised circumferentially.  We mobilized and performed a capsular release anteriorly, since the subscap was completely mobilized and gained circumferential visualization of glenoid which should have some moderate retroversion and severe bone wear.  The guidepin was then placed into the center of the glenoid and then we reamed the glenoid with size 48 which showed the best fit and coverage.  A rongeur was then used to remove the residual bony debris.  The margins of the glenoid and the solid subchondral bony bed was achieved.  We then placed our central drill hole followed by the peripheral peg holes.  I irrigated meticulously, a trial glenoid was placed with excellent fit.  At this point, we cleaned and dried the glenoid.  We mixed cement in appropriate consistency, was introduced into the peripheral peg holes and a final 48 glenoid was impacted into position.  The cement was allowed to harden with excellent fixation.  We returned our attention to the proximal humerus where we inserted our final size 12.5 modular stem to the appropriate level with excellent fit and fixation maintaining native retroversion approximately 25 degrees.  We then performed some trial reductions and the 52 x 18 eccentric head showed the best soft tissue balancing and good coverage of the bony surfaces.  The final 52 x 18 head was then impacted onto the clean and dried Morse taper.  Final bone contouring was completed.  The wound was irrigated.  Final reduction was performed.  This showed a 50% translation of the humeral head and glenoid.  The overall soft tissue balance was positioned much to our satisfaction.  At this point, the subscapularis was then confirmed to be properly mobilized and it was repaired back to lesser tuberosity with a series of #2 grasping FiberWire sutures.   Rotator interval was repaired with 3 figure-of-eight FiberWire sutures and a biceps tendon was then tenodesed at the level of the pec major tendon with FiberWire.  Wound was then irrigated.  Hemostasis was obtained.  The shoulder showed excellent motion.  Good soft tissue balance.  The deltopectoral interval was then reapproximated with figure-of-eight #1 Vicryl sutures.  2-0 Vicryl for subcu layer, intracuticular 2-0 Monocryl for skin followed by Dermabond and Aquacel dressing.  Left arm was placed in a sling.  The patient was then awakened, extubated, and taken to recovery room in stable condition.  [name] A. Shuford, [name] [name] was used as an Environmental consultant throughout this case, essential for help with positioning of the patient, positioning of the extremity, management of the retractors, tissue manipulation, implantation of prosthesis, wound closure, and intraoperative decision making.     [name] [name], M.D.     KMS/MEDQ  D:  02/02/2015  T:  02/03/2015  Job:  [id number]

## 2015-02-03 NOTE — Progress Notes (Signed)
Occupational Therapy Evaluation Patient Details Name: [name] [name] MRN: [id number] DOB: 03-26-1936 Today's Date: 02/03/2015    History of Present Illness L TSA   Clinical Impression   Completed all education regarding management of LUE with ADL, functional mobility and HEP per passive shoulder protocol. Excellent participation. Pt/wife demonstrated understanding. Pt ready for D/C home and will follow up with Dr. Onnie Graham for rehab of the shoulder.     Follow Up Recommendations  Supervision - Intermittent;Other (comment) (follow up with Dr. Onnie Graham per shoulder protocol)    Equipment Recommendations  None recommended by OT    Recommendations for Other Services       Precautions / Restrictions Precautions Precautions: Shoulder Type of Shoulder Precautions: passive protocol. FF 90; Abd 60; ER 30; pendulums Shoulder Interventions: Shoulder sling/immobilizer;Off for dressing/bathing/exercises;At all times Precaution Booklet Issued: Yes (comment) Restrictions Weight Bearing Restrictions: Yes LUE Weight Bearing: Non weight bearing      Mobility Bed Mobility Overal bed mobility: Modified Independent                Transfers Overall transfer level: Modified independent                    Balance Overall balance assessment: No apparent balance deficits (not formally assessed)                                          ADL Overall ADL's : Needs assistance/impaired                                     Functional mobility during ADLs: Modified independent General ADL Comments: Completed education regarding compensatory techniques during bathing/dressing session with pt/wife. Wife able to return demonstrate  Discussed home safety. Recommendation of using shower chair for bathing to reduce risk of falls. Pt/wife verbalized understanding.      Vision     Perception     Praxis      Pertinent Vitals/Pain Pain Assessment:  0-10 Pain Score: 8  Pain Location: L shoulder Pain Descriptors / Indicators: Aching Pain Intervention(s): Limited activity within patient's tolerance;Monitored during session;Repositioned;Ice applied     Hand Dominance Right   Extremity/Trunk Assessment Upper Extremity Assessment Upper Extremity Assessment: LUE deficits/detail LUE Deficits / Details: s/p replacement LUE: Unable to fully assess due to pain;Unable to fully assess due to immobilization LUE Coordination: decreased gross motor   Lower Extremity Assessment Lower Extremity Assessment: Overall WFL for tasks assessed   Cervical / Trunk Assessment Cervical / Trunk Assessment: Normal   Communication Communication Communication: No difficulties   Cognition Arousal/Alertness: Awake/alert Behavior During Therapy: WFL for tasks assessed/performed Overall Cognitive Status: Within Functional Limits for tasks assessed                     General Comments       Exercises Exercises: Shoulder     Shoulder Instructions Shoulder Instructions Donning/doffing shirt without moving shoulder: Caregiver independent with task Method for sponge bathing under operated UE: Modified independent Donning/doffing sling/immobilizer: Modified independent Correct positioning of sling/immobilizer: Modified independent Pendulum exercises (written home exercise program): Modified independent ROM for elbow, wrist and digits of operated UE: Modified independent Sling wearing schedule (on at all times/off for ADL's): Modified independent Proper positioning of operated UE when showering: Modified independent Positioning of  UE while sleeping: Modified independent    Home Living Family/patient expects to be discharged to:: Private residence Living Arrangements: Spouse/significant other Available Help at Discharge: Family;Available 24 hours/day Type of Home: House Home Access: Stairs to enter [employer] of Steps: 2 Entrance  Stairs-Rails: None Home Layout: Two level;Able to live on main level with bedroom/bathroom     Bathroom Shower/Tub: Tub/shower unit Shower/tub characteristics: Architectural technologist: Standard Bathroom Accessibility: Yes How Accessible: Accessible via walker Home Equipment: [place] - 2 wheels;Bedside commode          Prior Functioning/Environment Level of Independence: Independent        Comments: active with horses    OT Diagnosis: Generalized weakness;Acute pain   OT Problem List: Decreased strength;Decreased range of motion;Decreased activity tolerance;Decreased knowledge of precautions;Pain;Impaired UE functional use   OT Treatment/Interventions:      OT Goals(Current goals can be found in the care plan section) Acute Rehab OT Goals Patient Stated Goal: to go home OT Goal Formulation: All assessment and education complete, DC therapy  OT Frequency:     Barriers to D/C:            Co-evaluation              End of Session Nurse Communication: Mobility status;Other (comment) (ready for D/C)  Activity Tolerance: Patient tolerated treatment well Patient left: in chair;with call bell/phone within reach;with family/visitor present   Time: 0910-0956 OT Time Calculation (min): 46 min Charges:  OT General Charges $OT Visit: 1 Procedure OT Evaluation $Initial OT Evaluation Tier I: 1 Procedure OT Treatments $Self Care/Home Management : 8-22 mins $Therapeutic Exercise: 8-22 mins G-Codes:    Yarah Fuente,HILLARY Feb 28, 2015, 10:10 AM   Maurie Boettcher, OTR/L  [phone] 2015/02/28

## 2015-02-03 NOTE — Progress Notes (Signed)
[name] [name]  MRN: [id number] DOB/Age: 22-Sep-1935 79 y.o. Physician: Ander Slade, M.D. 1 Day Post-Op Procedure(s) (LRB): LEFT TOTAL SHOULDER ARTHROPLASTY (Left)  Subjective: Rested well last night, block wearing off and good pain control with oral meds Vital Signs Temp:  [97.7 F (36.5 C)-99.1 F (37.3 C)] 97.9 F (36.6 C) (05/20 0503) Pulse Rate:  [63-123] 70 (05/20 0503) Resp:  [10-24] 18 (05/20 0503) BP: (104-143)/(49-93) 112/59 mmHg (05/20 0503) SpO2:  [93 %-100 %] 98 % (05/20 0503)  Lab Results No results for input(s): WBC, HGB, HCT, PLT in the last 72 hours. BMET No results for input(s): NA, K, CL, CO2, GLUCOSE, BUN, CREATININE, CALCIUM in the last 72 hours. INR  Date Value Ref Range Status  01/23/2015 0.98 0.00 - 1.49 Final     Exam  Dressing dry left shoulder, N/V intact distally  Plan D/c home after OT , f/u 10 to 14 days, TSA protocol [name] 02/03/2015, 7:44 AM    Contact # [phone]

## 2015-02-07 ENCOUNTER — Encounter (HOSPITAL_COMMUNITY): Payer: Self-pay | Admitting: Orthopedic Surgery

## 2015-02-08 NOTE — Discharge Summary (Signed)
PATIENT ID:      [name] [name]  MRN:     [id number] DOB/AGE:    [birth year] / 79 y.o.     DISCHARGE SUMMARY  ADMISSION DATE:    02/02/2015 DISCHARGE DATE:  02/03/2015  ADMISSION DIAGNOSIS:  Left shoulder arthritis Past Medical History  Diagnosis Date  . HYPERLIPIDEMIA 04/23/2007  . 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 HYPERTENSION 06/02/2008  . URI 09/01/2008  . ALLERGIC RHINITIS 04/23/2007  . RECTAL BLEEDING 02/04/2008    no recent problems  . BENIGN PROSTATIC HYPERTROPHY 04/23/2007    resolved after TURP  . OSTEOARTHRITIS 04/23/2007  . Palpitations 02/04/2008  . CHEST PAIN 02/04/2008  . NAUSEA 12/29/2008    ? med related, no current problems  . NEPHROLITHIASIS, HX OF 04/23/2007    pt denies this   . Cellulitis june 2013    left side("left flank")  . PONV (postoperative nausea and vomiting)   . H/O blurred vision     both eyes, occasional  . Neuropathy of left foot   . Kidney stone   . Pneumonia   . Headache     DISCHARGE DIAGNOSIS:   Active Problems:   * No active hospital problems. *   PROCEDURE:  Left total shoulder arthroplasty   CONSULTS:   none  HISTORY:  See H&P in chart.  HOSPITAL COURSE:  [name] is a 79 y.o. admitted on 5/19/20106 with a chief complaint of left shoulder pain, and found to have a diagnosis of left shoulder end stage arthritis.  They were brought to the operating room on 5/19/200416 and underwent Left total shoulder arthroplasty     Patient underwent the above named procedure and tolerated it well. The following day they were hemodynamically stable and pain was controlled on oral analgesics. They were neurovascularly intact to the operative extremity. OT was ordered and worked with patient per protocol. They were medically and orthopaedically stable for discharge on 02/03/15.    DIAGNOSTIC STUDIES:  RECENT RADIOGRAPHIC STUDIES :  No results found.  RECENT VITAL SIGNS:  No data found. Marland Kitchen  RECENT EKG RESULTS:     Orders placed or performed during the hospital encounter of 01/23/15  . EKG 12-Lead  . EKG 12-Lead    DISCHARGE INSTRUCTIONS:    DISCHARGE MEDICATIONS:   Per med rec  FOLLOW UP VISIT:    DISCHARGE TO: Home  DISPOSITION: Good  DISCHARGE CONDITION:  Good   Makaila Windle for Dr. Justice Britain 02/08/2015, 4:28 PM

## 2015-03-13 ENCOUNTER — Other Ambulatory Visit: Payer: Self-pay

## 2015-03-15 ENCOUNTER — Ambulatory Visit: Payer: Medicare Other | Admitting: Internal Medicine

## 2015-03-17 ENCOUNTER — Encounter: Payer: Self-pay | Admitting: Internal Medicine

## 2015-03-17 ENCOUNTER — Ambulatory Visit (INDEPENDENT_AMBULATORY_CARE_PROVIDER_SITE_OTHER): Payer: Medicare Other | Admitting: Internal Medicine

## 2015-03-17 VITALS — BP 120/70 | HR 64 | Temp 98.3°F | Wt 200.0 lb

## 2015-03-17 DIAGNOSIS — Z96653 Presence of artificial knee joint, bilateral: Secondary | ICD-10-CM

## 2015-03-17 DIAGNOSIS — I10 Essential (primary) hypertension: Secondary | ICD-10-CM

## 2015-03-17 DIAGNOSIS — M150 Primary generalized (osteo)arthritis: Secondary | ICD-10-CM

## 2015-03-17 DIAGNOSIS — M159 Polyosteoarthritis, unspecified: Secondary | ICD-10-CM

## 2015-03-17 NOTE — Progress Notes (Signed)
Pre visit review using our clinic review tool, if applicable. No additional management support is needed unless otherwise documented below in the visit note. 

## 2015-03-17 NOTE — Progress Notes (Signed)
Subjective:    Patient ID: [name] [name], male    DOB: [birth year], 79 y.o.   MRN: [id number]  HPI  79 year old patient who has a history of arthritis.  He is status post knee and bilateral shoulder replacement surgeries.  He has essential hypertension that has been well-controlled on combination therapy.  No concerns or complaints today.  Remains on statin therapy for dyslipidemia.  His arthritis has been stable.  Past Medical History  Diagnosis Date  . HYPERLIPIDEMIA 04/23/2007  . 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 HYPERTENSION 06/02/2008  . URI 09/01/2008  . ALLERGIC RHINITIS 04/23/2007  . RECTAL BLEEDING 02/04/2008    no recent problems  . BENIGN PROSTATIC HYPERTROPHY 04/23/2007    resolved after TURP  . OSTEOARTHRITIS 04/23/2007  . Palpitations 02/04/2008  . CHEST PAIN 02/04/2008  . NAUSEA 12/29/2008    ? med related, no current problems  . NEPHROLITHIASIS, HX OF 04/23/2007    pt denies this   . Cellulitis june 2013    left side("left flank")  . PONV (postoperative nausea and vomiting)   . H/O blurred vision     both eyes, occasional  . Neuropathy of left foot   . Kidney stone   . Pneumonia   . Headache     History   Social History  . Marital Status: Married    Spouse Name: N/A  . Number of Children: N/A  . Years of Education: N/A   Occupational History  . Not on file.   Social History Main Topics  . Smoking status: Never Smoker   . Smokeless tobacco: Never Used  . Alcohol Use: No  . Drug Use: No  . Sexual Activity: Yes   Other Topics Concern  . Not on file   Social History Narrative    Past Surgical History  Procedure Laterality Date  . Appendectomy    . Hernia repair    . Lithotripsy  1992    cystoscopy with stent placement also done(1 stone fragment remains)  . Right shoulder replacement  Sep 28, 2009  . Left foot surgery for fx  Jul 30, 2011  . Transurethral resection of prostate  03/27/2012    Procedure: TRANSURETHRAL RESECTION OF  THE PROSTATE WITH GYRUS INSTRUMENTS;  Surgeon: Claybon Jabs, MD;  Location: [facility];  Service: Urology;;  . Total knee arthroplasty Right 08/30/2013    Procedure: RIGHT TOTAL KNEE ARTHROPLASTY RIGHT ;  Surgeon: Gearlean Alf, MD;  Location: [facility];  Service: Orthopedics;  Laterality: Right;  . Colonoscopy    . Total shoulder arthroplasty Left 02/02/2015    Procedure: LEFT TOTAL SHOULDER ARTHROPLASTY;  Surgeon: Justice Britain, MD;  Location: [facility];  Service: Orthopedics;  Laterality: Left;    Family History  Problem Relation Age of Onset  . Heart disease Mother 34    MI  . Stroke Father 57  . COPD Sister   . Diabetes Sister   . Diabetes Brother     No Known Allergies  Current Outpatient Prescriptions on File Prior to Visit  Medication Sig Dispense Refill  . aspirin 81 MG tablet Take 81 mg by mouth daily.    . B Complex Vitamins (VITAMIN-B COMPLEX PO) Take by mouth daily.    . benazepril-hydrochlorthiazide (LOTENSIN HCT) 20-12.5 MG per tablet TAKE 1 BY MOUTH EVERY MORNING 90 tablet 0  . celecoxib (CELEBREX) 200 MG capsule Take 1 capsule (200 mg total) by mouth daily. 90 capsule 3  .  Cholecalciferol (VITAMIN D3) 5000 UNITS CAPS Take 1,000 Units by mouth daily.    . Coenzyme Q10 (CO Q 10 PO) Take 1 tablet by mouth daily.     . fluticasone (FLONASE) 50 MCG/ACT nasal spray USE 1 SPRAY INTO BOTH NOSTRILS DAILY. 32 g 1  . Glucosamine-Chondroit-Vit C-Mn (GLUCOSAMINE CHONDR 1500 COMPLX) CAPS Take 1 capsule by mouth daily.    . hydroxypropyl methylcellulose / hypromellose (ISOPTO TEARS / GONIOVISC) 2.5 % ophthalmic solution Place 1 drop into both eyes as needed for dry eyes.    Marland Kitchen loratadine (CLARITIN) 10 MG tablet Take 10 mg by mouth every evening.     . Methylsulfonylmethane (MSM PO) Take 300 mg by mouth daily.    . Multiple Vitamin (MULTIVITAMIN) tablet Take 1 tablet by mouth daily.    Marland Kitchen oxyCODONE-acetaminophen (ROXICET) 5-325 MG per tablet Take 1 tablet by mouth every 4 (four) hours as  needed. 60 tablet 0  . potassium citrate (UROCIT-K) 10 MEQ (1080 MG) SR tablet TAKE 2 BY MOUTH TWICE DAILY WITH A MEAL (Patient taking differently: TAKE 20 MEQ BY MOUTH TWICE DAILY WITH A MEAL) 400 tablet 0  . psyllium (HYDROCIL/METAMUCIL) 95 % PACK Take 2 packets by mouth daily.     . simvastatin (ZOCOR) 40 MG tablet TAKE 1 BY MOUTH AT BEDTIME (Patient taking differently: TAKE 40 MG BY MOUTH AT BEDTIME) 90 tablet 0  . tadalafil (CIALIS) 5 MG tablet 1 tablet daily for BPH symptoms as well as ED (Patient taking differently: Take 5 mg by mouth daily as needed for erectile dysfunction (BPH). 1 tablet daily for BPH symptoms as well as E) 30 tablet 6   No current facility-administered medications on file prior to visit.    BP 120/70 mmHg  Pulse 64  Temp(Src) 98.3 F (36.8 C) (Oral)  Wt 200 lb (90.719 kg)     Review of Systems  Constitutional: Negative for fever, chills, appetite change and fatigue.  HENT: Negative for congestion, dental problem, ear pain, hearing loss, sore throat, tinnitus, trouble swallowing and voice change.   Eyes: Negative for pain, discharge and visual disturbance.  Respiratory: Negative for cough, chest tightness, wheezing and stridor.   Cardiovascular: Negative for chest pain, palpitations and leg swelling.  Gastrointestinal: Negative for nausea, vomiting, abdominal pain, diarrhea, constipation, blood in stool and abdominal distention.  Genitourinary: Negative for urgency, hematuria, flank pain, discharge, difficulty urinating and genital sores.  Musculoskeletal: Positive for arthralgias. Negative for myalgias, back pain, joint swelling, gait problem and neck stiffness.  Skin: Negative for rash.  Neurological: Negative for dizziness, syncope, speech difficulty, weakness, numbness and headaches.  Hematological: Negative for adenopathy. Does not bruise/bleed easily.  Psychiatric/Behavioral: Negative for behavioral problems and dysphoric mood. The patient is not  nervous/anxious.        Objective:   Physical Exam  Constitutional: He is oriented to person, place, and time. He appears well-developed.  HENT:  Head: Normocephalic.  Right Ear: External ear normal.  Left Ear: External ear normal.  Eyes: Conjunctivae and EOM are normal.  Neck: Normal range of motion.  Cardiovascular: Normal rate and normal heart sounds.   Pulmonary/Chest: Breath sounds normal.  Abdominal: Bowel sounds are normal.  Musculoskeletal: Normal range of motion. He exhibits no edema or tenderness.  Well-healed surgical scars from prior joint replacement surgeries  Neurological: He is alert and oriented to person, place, and time.  Psychiatric: He has a normal mood and affect. His behavior is normal.  Assessment & Plan:   Hypertension, well-controlled Osteoarthritis.  Status post knee and shoulder replacement surgeries Dyslipidemia.  Continue statin therapy Allergic rhinitis  CPX 6-12 months All medications updated

## 2015-03-17 NOTE — Patient Instructions (Signed)
Limit your sodium (Salt) intake  Please check your blood pressure on a regular basis.  If it is consistently greater than 150/90, please make an office appointment.  Return in one year for follow-up  

## 2015-04-24 ENCOUNTER — Other Ambulatory Visit: Payer: Self-pay | Admitting: Internal Medicine

## 2015-05-10 ENCOUNTER — Other Ambulatory Visit: Payer: Self-pay | Admitting: Internal Medicine

## 2015-07-31 ENCOUNTER — Other Ambulatory Visit: Payer: Self-pay | Admitting: Internal Medicine

## 2015-09-25 ENCOUNTER — Telehealth: Payer: Self-pay | Admitting: Internal Medicine

## 2015-09-25 NOTE — Telephone Encounter (Signed)
Pt will now be using [facility] FOR HIS PHARMACY

## 2015-09-25 NOTE — Telephone Encounter (Signed)
Noted, pharmacy changed in chart.

## 2015-10-02 ENCOUNTER — Telehealth: Payer: Self-pay | Admitting: Internal Medicine

## 2015-10-02 MED ORDER — GABAPENTIN 600 MG PO TABS: ORAL_TABLET | ORAL | Status: DC

## 2015-10-02 NOTE — Telephone Encounter (Signed)
Pt request refill of the following: gabapentin (NEURONTIN) 600 MG tablet   Phamacy:  [facility]

## 2015-10-02 NOTE — Telephone Encounter (Signed)
Pt notified Rx for Gabapentin sent to [facility] as requested.

## 2015-10-25 ENCOUNTER — Encounter: Payer: Self-pay | Admitting: Internal Medicine

## 2015-10-26 MED ORDER — GABAPENTIN 600 MG PO TABS: ORAL_TABLET | ORAL | Status: DC

## 2015-10-26 MED ORDER — BENAZEPRIL-HYDROCHLOROTHIAZIDE 20-12.5 MG PO TABS: ORAL_TABLET | ORAL | Status: DC

## 2015-10-26 MED ORDER — FLUTICASONE PROPIONATE 50 MCG/ACT NA SUSP: 1.0000 | Freq: Every day | NASAL | Status: DC

## 2015-10-26 MED ORDER — SIMVASTATIN 40 MG PO TABS: ORAL_TABLET | ORAL | Status: DC

## 2015-10-26 MED ORDER — CELECOXIB 200 MG PO CAPS: 200.0000 mg | ORAL_CAPSULE | Freq: Every day | ORAL | Status: DC

## 2015-10-26 MED ORDER — POTASSIUM CITRATE ER 10 MEQ (1080 MG) PO TBCR: EXTENDED_RELEASE_TABLET | ORAL | Status: DC

## 2015-10-26 NOTE — Telephone Encounter (Signed)
Spoke to pt, told him Rx's were sent to [facility] and the Celebrex Rx is ready for pickup. Pt verbalized understanding.

## 2015-12-06 DIAGNOSIS — H40023 Open angle with borderline findings, high risk, bilateral: Secondary | ICD-10-CM | POA: Diagnosis not present

## 2016-01-10 DIAGNOSIS — R972 Elevated prostate specific antigen [PSA]: Secondary | ICD-10-CM | POA: Diagnosis not present

## 2016-02-11 ENCOUNTER — Encounter (HOSPITAL_BASED_OUTPATIENT_CLINIC_OR_DEPARTMENT_OTHER): Payer: Self-pay | Admitting: Emergency Medicine

## 2016-02-11 ENCOUNTER — Emergency Department (HOSPITAL_BASED_OUTPATIENT_CLINIC_OR_DEPARTMENT_OTHER)
Admission: EM | Admit: 2016-02-11 | Discharge: 2016-02-11 | Disposition: A | Discharge status: Home / Self Care | Payer: Medicare Other | Attending: Emergency Medicine | Admitting: Emergency Medicine

## 2016-02-11 ENCOUNTER — Emergency Department (HOSPITAL_BASED_OUTPATIENT_CLINIC_OR_DEPARTMENT_OTHER): Payer: Medicare Other

## 2016-02-11 DIAGNOSIS — Y9389 Activity, other specified: Secondary | ICD-10-CM | POA: Insufficient documentation

## 2016-02-11 DIAGNOSIS — Z7982 Long term (current) use of aspirin: Secondary | ICD-10-CM | POA: Diagnosis not present

## 2016-02-11 DIAGNOSIS — Z23 Encounter for immunization: Secondary | ICD-10-CM | POA: Insufficient documentation

## 2016-02-11 DIAGNOSIS — Y999 Unspecified external cause status: Secondary | ICD-10-CM | POA: Diagnosis not present

## 2016-02-11 DIAGNOSIS — Z79899 Other long term (current) drug therapy: Secondary | ICD-10-CM | POA: Diagnosis not present

## 2016-02-11 DIAGNOSIS — W60XXXA Contact with nonvenomous plant thorns and spines and sharp leaves, initial encounter: Secondary | ICD-10-CM | POA: Diagnosis not present

## 2016-02-11 DIAGNOSIS — M7989 Other specified soft tissue disorders: Secondary | ICD-10-CM | POA: Diagnosis not present

## 2016-02-11 DIAGNOSIS — L089 Local infection of the skin and subcutaneous tissue, unspecified: Secondary | ICD-10-CM | POA: Insufficient documentation

## 2016-02-11 DIAGNOSIS — M79644 Pain in right finger(s): Secondary | ICD-10-CM | POA: Diagnosis present

## 2016-02-11 DIAGNOSIS — E785 Hyperlipidemia, unspecified: Secondary | ICD-10-CM | POA: Diagnosis not present

## 2016-02-11 DIAGNOSIS — I10 Essential (primary) hypertension: Secondary | ICD-10-CM | POA: Diagnosis not present

## 2016-02-11 DIAGNOSIS — Y92137 Garden or yard on military base as the place of occurrence of the external cause: Secondary | ICD-10-CM | POA: Diagnosis not present

## 2016-02-11 LAB — CBG MONITORING, ED: Glucose-Capillary: 98 mg/dL (ref 65–99)

## 2016-02-11 MED ORDER — CLINDAMYCIN HCL 150 MG PO CAPS
300.0000 mg | ORAL_CAPSULE | Freq: Once | ORAL | Status: AC
  Administered 2016-02-11: 300 mg via ORAL
  Filled 2016-02-11: qty 2

## 2016-02-11 MED ORDER — HYDROCODONE-ACETAMINOPHEN 5-325 MG PO TABS: 1.0000 | ORAL_TABLET | ORAL | Status: DC | PRN

## 2016-02-11 MED ORDER — TETANUS-DIPHTH-ACELL PERTUSSIS 5-2.5-18.5 LF-MCG/0.5 IM SUSP
0.5000 mL | Freq: Once | INTRAMUSCULAR | Status: AC
  Administered 2016-02-11: 0.5 mL via INTRAMUSCULAR
  Filled 2016-02-11: qty 0.5

## 2016-02-11 MED ORDER — CLINDAMYCIN HCL 150 MG PO CAPS: 300.0000 mg | ORAL_CAPSULE | Freq: Four times a day (QID) | ORAL | Status: DC

## 2016-02-11 NOTE — ED Notes (Addendum)
While leaving ED after discharge, patient began shaking and c/o sudden onset nausea and chest tightness.  Pt brought back into ED treatment room for further evaluation.

## 2016-02-11 NOTE — ED Notes (Signed)
Pt pulling out nettles yesterday and got a briar stuck in right 2nd finger.  Right 2nd finger swollen and red today.

## 2016-02-11 NOTE — ED Notes (Signed)
Pt c/o R index finger pain which started yesterday. Pt thinks he may have gotten a briar stuck in his finger. Index finger is noted to be red. Pt states his doctor told him to come to the ED for evaluation any time there is a suspected infection due to multiple joint replacements.

## 2016-02-11 NOTE — ED Notes (Signed)
Pt lying in stretcher, no dyspnea noted, calmer, NAD, alert, interactive, less shaky, "feeling better", VSS.

## 2016-02-11 NOTE — ED Notes (Signed)
Pt alert, NAD, calm, interactive, resps e/u, speaking in clear complete sentences, no dyspnea noted, R hand elevated, wife at [facility].

## 2016-02-11 NOTE — ED Provider Notes (Signed)
CSN: [id number]     Arrival date & time 02/11/16  1753 History   First MD Initiated Contact with Patient 02/11/16 1825     Chief Complaint  Patient presents with  . Finger Injury     (Consider location/radiation/quality/duration/timing/severity/associated sxs/prior Treatment) HPI   Patient is 80 year old male with PMH of HTN and HLD who presents to the ED with complaint of right finger pain and swelling, onset yesterday. Patient reports he was working in the yard yesterday and notes that he stuck his right index finger pad thorn when pulling out a thorn bush. Patient reports he was able to pull out a small thorn from his finger but states over the past day he has began to have worsening swelling, pain and redness to his right index finger. Denies fever, numbness, tingling, weakness, drainage. Patient reports he called his orthopedist and spoke with Dr. Tonita Cong (orthopedist on call with [facility]) who advised him to come to the ED for antibiotics. Patient denies any other recent injury or trauma to affected site. Pt endorses having multiple joint replacements. Tetanus not UTD.  Past Medical History  Diagnosis Date  . HYPERLIPIDEMIA 04/23/2007  . 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 HYPERTENSION 06/02/2008  . URI 09/01/2008  . ALLERGIC RHINITIS 04/23/2007  . RECTAL BLEEDING 02/04/2008    no recent problems  . BENIGN PROSTATIC HYPERTROPHY 04/23/2007    resolved after TURP  . OSTEOARTHRITIS 04/23/2007  . Palpitations 02/04/2008  . CHEST PAIN 02/04/2008  . NAUSEA 12/29/2008    ? med related, no current problems  . NEPHROLITHIASIS, HX OF 04/23/2007    pt denies this   . Cellulitis june 2013    left side("left flank")  . PONV (postoperative nausea and vomiting)   . H/O blurred vision     both eyes, occasional  . Neuropathy of left foot   . Kidney stone   . Pneumonia   . Headache    Past Surgical History  Procedure Laterality Date  . Appendectomy    . Hernia repair    . Lithotripsy   1992    cystoscopy with stent placement also done(1 stone fragment remains)  . Right shoulder replacement  Sep 28, 2009  . Left foot surgery for fx  Jul 30, 2011  . Transurethral resection of prostate  03/27/2012    Procedure: TRANSURETHRAL RESECTION OF THE PROSTATE WITH GYRUS INSTRUMENTS;  Surgeon: Claybon Jabs, MD;  Location: [facility];  Service: Urology;;  . Total knee arthroplasty Right 08/30/2013    Procedure: RIGHT TOTAL KNEE ARTHROPLASTY RIGHT ;  Surgeon: Gearlean Alf, MD;  Location: [facility];  Service: Orthopedics;  Laterality: Right;  . Colonoscopy    . Total shoulder arthroplasty Left 02/02/2015    Procedure: LEFT TOTAL SHOULDER ARTHROPLASTY;  Surgeon: Justice Britain, MD;  Location: [facility];  Service: Orthopedics;  Laterality: Left;   Family History  Problem Relation Age of Onset  . Heart disease Mother 69    MI  . Stroke Father 47  . COPD Sister   . Diabetes Sister   . Diabetes Brother    Social History  Substance Use Topics  . Smoking status: Never Smoker   . Smokeless tobacco: Never Used  . Alcohol Use: No    Review of Systems  Constitutional: Negative for fever.  Musculoskeletal: Positive for joint swelling (right index finger) and arthralgias (right index finger).  Skin: Positive for color change (redness). Negative for rash.  Neurological: Negative for weakness  and numbness.      Allergies  Review of patient's allergies indicates no known allergies.  Home Medications   Prior to Admission medications   Medication Sig Start Date End Date Taking? Authorizing Provider  aspirin 81 MG tablet Take 81 mg by mouth daily.   Yes Historical Provider, MD  B Complex Vitamins (VITAMIN-B COMPLEX PO) Take by mouth daily.   Yes Historical Provider, MD  benazepril-hydrochlorthiazide (LOTENSIN HCT) 20-12.5 MG tablet TAKE 1 BY MOUTH EVERY MORNING 10/26/15  Yes Marletta Lor, MD  celecoxib (CELEBREX) 200 MG capsule Take 1 capsule (200 mg total) by mouth daily. 10/26/15  Yes Marletta Lor, MD  Cholecalciferol (VITAMIN D3) 5000 UNITS CAPS Take 1,000 Units by mouth daily.   Yes Historical Provider, MD  Coenzyme Q10 (CO Q 10 PO) Take 1 tablet by mouth daily.    Yes Historical Provider, MD  fluticasone (FLONASE) 50 MCG/ACT nasal spray Place 1 spray into both nostrils daily. 10/26/15  Yes Marletta Lor, MD  gabapentin (NEURONTIN) 600 MG tablet TAKE 1 BY MOUTH 3 TIMES DAILY 10/26/15  Yes Marletta Lor, MD  Glucosamine-Chondroit-Vit C-Mn (GLUCOSAMINE CHONDR 1500 COMPLX) CAPS Take 1 capsule by mouth daily.   Yes Historical Provider, MD  loratadine (CLARITIN) 10 MG tablet Take 10 mg by mouth every evening.    Yes Historical Provider, MD  Methylsulfonylmethane (MSM PO) Take 300 mg by mouth daily.   Yes Historical Provider, MD  potassium citrate (UROCIT-K) 10 MEQ (1080 MG) SR tablet TAKE 2 BY MOUTH TWICE DAILY WITH A MEAL 10/26/15  Yes Marletta Lor, MD  psyllium (HYDROCIL/METAMUCIL) 95 % PACK Take 2 packets by mouth daily.    Yes Historical Provider, MD  simvastatin (ZOCOR) 40 MG tablet TAKE 1 BY MOUTH AT BEDTIME 10/26/15  Yes Marletta Lor, MD  tadalafil (CIALIS) 5 MG tablet 1 tablet daily for BPH symptoms as well as ED Patient taking differently: Take 5 mg by mouth daily as needed for erectile dysfunction (BPH). 1 tablet daily for BPH symptoms as well as E 09/13/14  Yes Marletta Lor, MD  clindamycin (CLEOCIN) 150 MG capsule Take 2 capsules (300 mg total) by mouth 4 (four) times daily. May dispense as 150mg  capsules 02/11/16   Nona Dell, PA-C  HYDROcodone-acetaminophen (NORCO/VICODIN) 5-325 MG tablet Take 1 tablet by mouth every 4 (four) hours as needed. 02/11/16   Nona Dell, PA-C  hydroxypropyl methylcellulose / hypromellose (ISOPTO TEARS / GONIOVISC) 2.5 % ophthalmic solution Place 1 drop into both eyes as needed for dry eyes.    Historical Provider, MD  Multiple Vitamin (MULTIVITAMIN) tablet Take 1 tablet by mouth daily.     Historical Provider, MD  oxyCODONE-acetaminophen (ROXICET) 5-325 MG per tablet Take 1 tablet by mouth every 4 (four) hours as needed. 02/02/15   Justice Britain, MD   BP 134/67 mmHg  Pulse 78  Temp(Src) 98.9 F (37.2 C) (Axillary)  Resp 19  Ht 5\' 10"  (1.778 m)  Wt 90.719 kg  BMI 28.70 kg/m2  SpO2 97% Physical Exam  Constitutional: He is oriented to person, place, and time. He appears well-developed and well-nourished. No distress.  HENT:  Head: Normocephalic and atraumatic.  Eyes: Conjunctivae and EOM are normal. Right eye exhibits no discharge. Left eye exhibits no discharge. No scleral icterus.  Neck: Normal range of motion. Neck supple.  Cardiovascular: Normal rate.   Pulmonary/Chest: Effort normal.  Musculoskeletal:       Right hand: He exhibits tenderness and  swelling. He exhibits normal range of motion, no bony tenderness, normal two-point discrimination, normal capillary refill, no deformity and no laceration. Normal sensation noted. Normal strength noted.       Hands: Mild swelling and erythema noted to right index finger at middle and distal phalanx and DIP joint with TTP. Right index finger pad soft with no induration or fluctuance. 2+ radial pulse. Cap refill <2.   Neurological: He is alert and oriented to person, place, and time.  Skin: He is not diaphoretic.  Nursing note and vitals reviewed.          ED Course  Procedures (including critical care time) Labs Review Labs Reviewed  CBG MONITORING, ED    Imaging Review Dg Finger Index Right  02/11/2016  CLINICAL DATA:  Redness and swelling for 1 day, possible foreign body EXAM: RIGHT INDEX FINGER 2+V COMPARISON:  None. FINDINGS: Degenerative changes are noted at the second DIP joint. No acute fracture or dislocation is seen. No radiopaque foreign body is noted. IMPRESSION: Degenerative change without acute abnormality. Electronically Signed   By: Inez Catalina M.D.   On: 02/11/2016 19:16   I have personally  reviewed and evaluated these images and lab results as part of my medical decision-making.   EKG Interpretation   Date/Time:  Sunday Feb 11 2016 20:40:18 EDT Ventricular Rate:  77 PR Interval:  174 QRS Duration: 102 QT Interval:  379 QTC Calculation: 429 R Axis:   11 Text Interpretation:  Sinus rhythm since last tracing no significant  change Confirmed by BELFI  MD, MELANIE ([id number]) on 02/11/2016 8:48:47 PM      MDM   Final diagnoses:  Finger infection    Patient presents with redness and swelling to right index finger that occurred after getting a thorn second his finger yesterday. Denies drainage or fever. VSS. Exam revealed mild swelling and erythema to right index finger at middle and distal phalanx. Finger soft, no induration, fluctuance or drainage. Right index finger x-ray negative. Consulted hand surgery for finger infection. Dr. Grandville Silos advised that it exam is concerning for felon, than an I&D is warranted with initiation of antibiotics. Exam non-concernig for tenosynovitis at this time. Tetanus updated. Plan to start patient on clindamycin, patient given initial dose in the ED. We'll discharge patient home with symptomatic treatment and pain meds. Discussed results and plan for discharge with patient. Advised patient to return to the ED tomorrow afternoon for wound recheck due to outpatient orthopedic offices been closed. I advised patient to schedule a follow-up appointment with his requested orthopedist at [facility] in 2 days.  I was notified by nurse, while patient was in the ED lobby after being discharged and shaking and reporting sudden onset of nausea and chest tightness. Patient was brought back into in ED room for further evaluation. On my reevaluation, patient reports his symptoms resolved spontaneously approximately 3 minutes after onset. Patient reports he currently feels back at baseline. VSS, afebrile. Exam unremarkable, RRR, lungs CTAB, CNs 2-12 grossly  intact, no neuro deficits. I suspect patient's symptoms are likely due to vagal episode after getting tetanus shot. CBG 98. EKG showed sinus rhythm. Patient is hemodynamically stable. I feel that patient is appropriate to be discharged home at this time. Discussed return precautions with patient.       Chesley Noon [place], [place] 02/12/16 0017  Malvin Johns, MD 02/14/16 [phone]

## 2016-02-11 NOTE — ED Notes (Signed)
EDP and PA updated, in to see.

## 2016-02-11 NOTE — Discharge Instructions (Signed)
Take your medications as prescribed. He may also take Tylenol and/or ibuprofen as prescribed over-the-counter for pain relief. Return to the emergency Department tomorrow afternoon for wound recheck. I also recommend following up with the hand surgeon listed above for wound recheck on Tuesday. Please return to the Emergency Department if symptoms worsen or new onset of fever, worsening redness, swelling, warmth, drainage, decreased range of motion of right index finger, numbness, tingling, weakness.

## 2016-02-12 ENCOUNTER — Encounter (HOSPITAL_BASED_OUTPATIENT_CLINIC_OR_DEPARTMENT_OTHER): Payer: Self-pay | Admitting: Emergency Medicine

## 2016-02-12 ENCOUNTER — Emergency Department (HOSPITAL_BASED_OUTPATIENT_CLINIC_OR_DEPARTMENT_OTHER)
Admission: EM | Admit: 2016-02-12 | Discharge: 2016-02-12 | Disposition: A | Discharge status: Home / Self Care | Payer: Medicare Other | Attending: Emergency Medicine | Admitting: Emergency Medicine

## 2016-02-12 DIAGNOSIS — M1990 Unspecified osteoarthritis, unspecified site: Secondary | ICD-10-CM | POA: Diagnosis not present

## 2016-02-12 DIAGNOSIS — L089 Local infection of the skin and subcutaneous tissue, unspecified: Secondary | ICD-10-CM | POA: Diagnosis not present

## 2016-02-12 DIAGNOSIS — Z79899 Other long term (current) drug therapy: Secondary | ICD-10-CM | POA: Diagnosis not present

## 2016-02-12 DIAGNOSIS — M60044 Infective myositis, right finger(s): Secondary | ICD-10-CM | POA: Insufficient documentation

## 2016-02-12 DIAGNOSIS — E785 Hyperlipidemia, unspecified: Secondary | ICD-10-CM | POA: Insufficient documentation

## 2016-02-12 DIAGNOSIS — I10 Essential (primary) hypertension: Secondary | ICD-10-CM | POA: Diagnosis not present

## 2016-02-12 DIAGNOSIS — Z7982 Long term (current) use of aspirin: Secondary | ICD-10-CM | POA: Diagnosis not present

## 2016-02-12 NOTE — ED Notes (Signed)
Pt sts he was seen here last night for an infection in his finger and was told to come back for a recheck today. Pt sts it is looking better today.

## 2016-02-12 NOTE — Discharge Instructions (Signed)
Fingertip Infection When an infection is around the nail, it is called a paronychia. When it appears over the tip of the finger, it is called a felon. These infections are due to minor injuries or cracks in the skin. If they are not treated properly, they can lead to bone infection and permanent damage to the fingernail. Incision and drainage is necessary if a pus pocket (an abscess) has formed. Antibiotics and pain medicine may also be needed. Keep your hand elevated for the next 2-3 days to reduce swelling and pain. If a pack was placed in the abscess, it should be removed in 1-2 days by your caregiver. Soak the finger in warm water for 20 minutes 4 times daily to help promote drainage. Keep the hands as dry as possible. Wear protective gloves with cotton liners. See your caregiver for follow-up care as recommended.  HOME CARE INSTRUCTIONS   Keep wound clean, dry and dressed as suggested by your caregiver.  Soak in warm salt water for fifteen minutes, four times per day for bacterial infections.  Your caregiver will prescribe an antibiotic if a bacterial infection is suspected. Take antibiotics as directed and finish the prescription, even if the problem appears to be improving before the medicine is gone.  Only take over-the-counter or prescription medicines for pain, discomfort, or fever as directed by your caregiver. SEEK IMMEDIATE MEDICAL CARE IF:  There is redness, swelling, or increasing pain in the wound.  Pus or any other unusual drainage is coming from the wound.  An unexplained oral temperature above 102 F (38.9 C) develops.  You notice a foul smell coming from the wound or dressing. MAKE SURE YOU:   Understand these instructions.  Monitor your condition.  Contact your caregiver if you are getting worse or not improving.   This information is not intended to replace advice given to you by your health care provider. Make sure you discuss any questions you have with your  health care provider.   Document Released: 10/10/2004 Document Revised: 11/25/2011 Document Reviewed: 02/20/2015 Elsevier Interactive Patient Education 2016 [employer]  

## 2016-02-12 NOTE — ED Notes (Signed)
MD at bedside. 

## 2016-02-12 NOTE — ED Provider Notes (Signed)
CSN: [id number]     Arrival date & time 02/12/16  1703 History  By signing my name below, I, Hansel Feinstein, attest that this documentation has been prepared under the direction and in the presence of Dorie Rank, MD. Electronically Signed: Hansel Feinstein, ED Scribe. 02/12/2016. 5:37 PM.     Chief Complaint  Patient presents with  . Follow-up   The history is provided by the patient. No language interpreter was used.   HPI Comments: [name] [name] is a 80 y.o. male who presents to the Emergency Department for wound check of an infection to the right index finger onset 2 days ago. Pt was seen initially in the ED yesterday for these symptoms, had negative XR and was given rx for Clindamycin. The area did not require lancing. He states he has been compliant with this medication. Pt states his pain, redness and swelling has decreased significantly since yesterday. He denies fever, chills.   Past Medical History  Diagnosis Date  . HYPERLIPIDEMIA 04/23/2007  . 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 HYPERTENSION 06/02/2008  . URI 09/01/2008  . ALLERGIC RHINITIS 04/23/2007  . RECTAL BLEEDING 02/04/2008    no recent problems  . BENIGN PROSTATIC HYPERTROPHY 04/23/2007    resolved after TURP  . OSTEOARTHRITIS 04/23/2007  . Palpitations 02/04/2008  . CHEST PAIN 02/04/2008  . NAUSEA 12/29/2008    ? med related, no current problems  . NEPHROLITHIASIS, HX OF 04/23/2007    pt denies this   . Cellulitis june 2013    left side("left flank")  . PONV (postoperative nausea and vomiting)   . H/O blurred vision     both eyes, occasional  . Neuropathy of left foot   . Kidney stone   . Pneumonia   . Headache    Past Surgical History  Procedure Laterality Date  . Appendectomy    . Hernia repair    . Lithotripsy  1992    cystoscopy with stent placement also done(1 stone fragment remains)  . Right shoulder replacement  Sep 28, 2009  . Left foot surgery for fx  Jul 30, 2011  . Transurethral resection of  prostate  03/27/2012    Procedure: TRANSURETHRAL RESECTION OF THE PROSTATE WITH GYRUS INSTRUMENTS;  Surgeon: Claybon Jabs, MD;  Location: [facility];  Service: Urology;;  . Total knee arthroplasty Right 08/30/2013    Procedure: RIGHT TOTAL KNEE ARTHROPLASTY RIGHT ;  Surgeon: Gearlean Alf, MD;  Location: [facility];  Service: Orthopedics;  Laterality: Right;  . Colonoscopy    . Total shoulder arthroplasty Left 02/02/2015    Procedure: LEFT TOTAL SHOULDER ARTHROPLASTY;  Surgeon: Justice Britain, MD;  Location: [place];  Service: Orthopedics;  Laterality: Left;   Family History  Problem Relation Age of Onset  . Heart disease Mother 72    MI  . Stroke Father 65  . COPD Sister   . Diabetes Sister   . Diabetes Brother    Social History  Substance Use Topics  . Smoking status: Never Smoker   . Smokeless tobacco: Never Used  . Alcohol Use: No    Review of Systems  Constitutional: Negative for fever and chills.  Skin:       +area of pain, redness and swelling to the right index finger  All other systems reviewed and are negative.  Allergies  Review of patient's allergies indicates no known allergies.  Home Medications   Prior to Admission medications   Medication Sig Start  Date End Date Taking? Authorizing Provider  aspirin 81 MG tablet Take 81 mg by mouth daily.    Historical Provider, MD  B Complex Vitamins (VITAMIN-B COMPLEX PO) Take by mouth daily.    Historical Provider, MD  benazepril-hydrochlorthiazide (LOTENSIN HCT) 20-12.5 MG tablet TAKE 1 BY MOUTH EVERY MORNING 10/26/15   Marletta Lor, MD  celecoxib (CELEBREX) 200 MG capsule Take 1 capsule (200 mg total) by mouth daily. 10/26/15   Marletta Lor, MD  Cholecalciferol (VITAMIN D3) 5000 UNITS CAPS Take 1,000 Units by mouth daily.    Historical Provider, MD  clindamycin (CLEOCIN) 150 MG capsule Take 2 capsules (300 mg total) by mouth 4 (four) times daily. May dispense as 150mg  capsules 02/11/16   Nona Dell, PA-C   Coenzyme Q10 (CO Q 10 PO) Take 1 tablet by mouth daily.     Historical Provider, MD  fluticasone (FLONASE) 50 MCG/ACT nasal spray Place 1 spray into both nostrils daily. 10/26/15   Marletta Lor, MD  gabapentin (NEURONTIN) 600 MG tablet TAKE 1 BY MOUTH 3 TIMES DAILY 10/26/15   Marletta Lor, MD  Glucosamine-Chondroit-Vit C-Mn (GLUCOSAMINE CHONDR 1500 COMPLX) CAPS Take 1 capsule by mouth daily.    Historical Provider, MD  HYDROcodone-acetaminophen (NORCO/VICODIN) 5-325 MG tablet Take 1 tablet by mouth every 4 (four) hours as needed. 02/11/16   Nona Dell, PA-C  hydroxypropyl methylcellulose / hypromellose (ISOPTO TEARS / GONIOVISC) 2.5 % ophthalmic solution Place 1 drop into both eyes as needed for dry eyes.    Historical Provider, MD  loratadine (CLARITIN) 10 MG tablet Take 10 mg by mouth every evening.     Historical Provider, MD  Methylsulfonylmethane (MSM PO) Take 300 mg by mouth daily.    Historical Provider, MD  Multiple Vitamin (MULTIVITAMIN) tablet Take 1 tablet by mouth daily.    Historical Provider, MD  oxyCODONE-acetaminophen (ROXICET) 5-325 MG per tablet Take 1 tablet by mouth every 4 (four) hours as needed. 02/02/15   Justice Britain, MD  potassium citrate (UROCIT-K) 10 MEQ (1080 MG) SR tablet TAKE 2 BY MOUTH TWICE DAILY WITH A MEAL 10/26/15   Marletta Lor, MD  psyllium (HYDROCIL/METAMUCIL) 95 % PACK Take 2 packets by mouth daily.     Historical Provider, MD  simvastatin (ZOCOR) 40 MG tablet TAKE 1 BY MOUTH AT BEDTIME 10/26/15   Marletta Lor, MD  tadalafil (CIALIS) 5 MG tablet 1 tablet daily for BPH symptoms as well as ED Patient taking differently: Take 5 mg by mouth daily as needed for erectile dysfunction (BPH). 1 tablet daily for BPH symptoms as well as E 09/13/14   Marletta Lor, MD   BP 131/70 mmHg  Pulse 70  Temp(Src) 97.8 F (36.6 C) (Oral)  Resp 18  Ht 5\' 10"  (1.778 m)  Wt 200 lb (90.719 kg)  BMI 28.70 kg/m2  SpO2 97% Physical Exam   Constitutional: He appears well-developed and well-nourished. No distress.  HENT:  Head: Normocephalic and atraumatic.  Right Ear: External ear normal.  Left Ear: External ear normal.  Eyes: Conjunctivae are normal. Right eye exhibits no discharge. Left eye exhibits no discharge. No scleral icterus.  Neck: Neck supple. No tracheal deviation present.  Cardiovascular: Normal rate.   Pulmonary/Chest: Effort normal. No stridor. No respiratory distress.  Musculoskeletal: He exhibits no edema.  Mild edema of the right index finger. No pain with ROM. No increased warmth. No lymphangitic streaking.   Neurological: He is alert. Cranial nerve deficit: no gross deficits.  Skin: Skin is warm and dry. No rash noted.  Psychiatric: He has a normal mood and affect.  Nursing note and vitals reviewed.   ED Course  Procedures (including critical care time) DIAGNOSTIC STUDIES: Oxygen Saturation is 97% on RA, normal by my interpretation.    COORDINATION OF CARE: 5:28 PM Discussed treatment plan with pt at bedside which includes continued Clindamycin and pt agreed to plan.    MDM   Final diagnoses:  Finger infection   Patient was seen yesterday for an infection of his finger. Patient was started on antibiotics. He has noticed a significant improvement. The pain and swelling are decreasing decreasing  Plan is to continue his current regimen. Follow-up as previously instructed   I personally performed the services described in this documentation, which was scribed in my presence.  The recorded information has been reviewed and is accurate.   Dorie Rank, MD 02/12/16 [id number]

## 2016-02-15 DIAGNOSIS — Z96612 Presence of left artificial shoulder joint: Secondary | ICD-10-CM | POA: Diagnosis not present

## 2016-02-15 DIAGNOSIS — Z471 Aftercare following joint replacement surgery: Secondary | ICD-10-CM | POA: Diagnosis not present

## 2016-03-12 DIAGNOSIS — L821 Other seborrheic keratosis: Secondary | ICD-10-CM | POA: Diagnosis not present

## 2016-03-12 DIAGNOSIS — D2262 Melanocytic nevi of left upper limb, including shoulder: Secondary | ICD-10-CM | POA: Diagnosis not present

## 2016-03-12 DIAGNOSIS — D2261 Melanocytic nevi of right upper limb, including shoulder: Secondary | ICD-10-CM | POA: Diagnosis not present

## 2016-03-12 DIAGNOSIS — L570 Actinic keratosis: Secondary | ICD-10-CM | POA: Diagnosis not present

## 2016-03-21 ENCOUNTER — Other Ambulatory Visit (INDEPENDENT_AMBULATORY_CARE_PROVIDER_SITE_OTHER): Payer: Medicare Other

## 2016-03-21 DIAGNOSIS — Z0000 Encounter for general adult medical examination without abnormal findings: Secondary | ICD-10-CM

## 2016-03-21 LAB — BASIC METABOLIC PANEL
BUN: 34 mg/dL — ABNORMAL HIGH (ref 6–23)
CALCIUM: 9.7 mg/dL (ref 8.4–10.5)
CO2: 27 meq/L (ref 19–32)
CREATININE: 0.78 mg/dL (ref 0.40–1.50)
Chloride: 104 mEq/L (ref 96–112)
GFR: 102.44 mL/min (ref >60.00)
Glucose, Bld: 104 mg/dL — ABNORMAL HIGH (ref 70–99)
Potassium: 4.5 mEq/L (ref 3.5–5.1)
Sodium: 139 mEq/L (ref 135–145)

## 2016-03-21 LAB — CBC WITH DIFFERENTIAL/PLATELET
BASOS ABS: 0 10*3/uL (ref 0.0–0.1)
Basophils Relative: 0.6 % (ref 0.0–3.0)
EOS ABS: 0.4 10*3/uL (ref 0.0–0.7)
Eosinophils Relative: 5.2 % — ABNORMAL HIGH (ref 0.0–5.0)
HEMATOCRIT: 43.3 % (ref 39.0–52.0)
Hemoglobin: 14.7 g/dL (ref 13.0–17.0)
LYMPHS ABS: 1.5 10*3/uL (ref 0.7–4.0)
LYMPHS PCT: 20.3 % (ref 12.0–46.0)
MCHC: 33.9 g/dL (ref 30.0–36.0)
MCV: 90.6 fl (ref 78.0–100.0)
Monocytes Absolute: 0.6 10*3/uL (ref 0.1–1.0)
Monocytes Relative: 8.5 % (ref 3.0–12.0)
NEUTROS ABS: 4.9 10*3/uL (ref 1.4–7.7)
NEUTROS PCT: 65.4 % (ref 43.0–77.0)
PLATELETS: 188 10*3/uL (ref 150.0–400.0)
RBC: 4.78 Mil/uL (ref 4.22–5.81)
RDW: 13.4 % (ref 11.5–15.5)
WBC: 7.4 10*3/uL (ref 4.0–10.5)

## 2016-03-21 LAB — LIPID PANEL
CHOL/HDL RATIO: 3
Cholesterol: 152 mg/dL (ref 0–200)
HDL: 49.9 mg/dL (ref >39.00)
LDL CALC: 84 mg/dL (ref 0–99)
NONHDL: 102.45
Triglycerides: 90 mg/dL (ref 0.0–149.0)
VLDL: 18 mg/dL (ref 0.0–40.0)

## 2016-03-21 LAB — POC URINALSYSI DIPSTICK (AUTOMATED)
Bilirubin, UA: NEGATIVE
Blood, UA: NEGATIVE
GLUCOSE UA: NEGATIVE
KETONES UA: NEGATIVE
Leukocytes, UA: NEGATIVE
Nitrite, UA: NEGATIVE
Protein, UA: NEGATIVE
SPEC GRAV UA: 1.015
Urobilinogen, UA: 0.2
pH, UA: 5

## 2016-03-21 LAB — TSH: TSH: 3.67 u[IU]/mL (ref 0.35–4.50)

## 2016-03-21 LAB — HEPATIC FUNCTION PANEL
ALK PHOS: 70 U/L (ref 39–117)
ALT: 18 U/L (ref 0–53)
AST: 17 U/L (ref 0–37)
Albumin: 4.6 g/dL (ref 3.5–5.2)
BILIRUBIN DIRECT: 0.2 mg/dL (ref 0.0–0.3)
BILIRUBIN TOTAL: 1.1 mg/dL (ref 0.2–1.2)
Total Protein: 6.9 g/dL (ref 6.0–8.3)

## 2016-03-21 LAB — PSA: PSA: 4.34 ng/mL — ABNORMAL HIGH (ref 0.10–4.00)

## 2016-03-26 ENCOUNTER — Encounter: Payer: Medicare Other | Admitting: Internal Medicine

## 2016-04-01 ENCOUNTER — Ambulatory Visit (INDEPENDENT_AMBULATORY_CARE_PROVIDER_SITE_OTHER): Payer: Medicare Other | Admitting: Internal Medicine

## 2016-04-01 ENCOUNTER — Encounter: Payer: Self-pay | Admitting: Internal Medicine

## 2016-04-01 VITALS — BP 120/70 | HR 69 | Temp 98.0°F | Ht 68.5 in | Wt 198.0 lb

## 2016-04-01 DIAGNOSIS — Z0000 Encounter for general adult medical examination without abnormal findings: Secondary | ICD-10-CM | POA: Diagnosis not present

## 2016-04-01 DIAGNOSIS — M150 Primary generalized (osteo)arthritis: Secondary | ICD-10-CM | POA: Diagnosis not present

## 2016-04-01 DIAGNOSIS — R7302 Impaired glucose tolerance (oral): Secondary | ICD-10-CM

## 2016-04-01 DIAGNOSIS — I10 Essential (primary) hypertension: Secondary | ICD-10-CM | POA: Diagnosis not present

## 2016-04-01 DIAGNOSIS — E785 Hyperlipidemia, unspecified: Secondary | ICD-10-CM

## 2016-04-01 DIAGNOSIS — J3089 Other allergic rhinitis: Secondary | ICD-10-CM

## 2016-04-01 DIAGNOSIS — M159 Polyosteoarthritis, unspecified: Secondary | ICD-10-CM

## 2016-04-01 HISTORY — DX: Impaired glucose tolerance (oral): R73.02

## 2016-04-01 NOTE — Progress Notes (Signed)
Patient ID: [name] [name], male   DOB: 02/09/1936, 80 y.o.   MRN: [id number]  Subjective:    Patient ID: [name] [name], male    DOB: 05-12-1936, 80 y.o.   MRN: [id number]  Hypertension Pertinent negatives include no chest pain, headaches, neck pain, palpitations or shortness of breath.   History of Present Illness:   80 -year-old patient who is seen today for a wellness examination.  He is followed by urology for BPH and has a history of nephrolithiasis. He has dyslipidemia osteoarthritis and hypertension. He is doing quite well and denies any cardiopulmonary complaints. He has had a TURP and also knee replacement surgery  Past Medical History  Diagnosis Date  . HYPERLIPIDEMIA 04/23/2007  . 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 HYPERTENSION 06/02/2008  . URI 09/01/2008  . ALLERGIC RHINITIS 04/23/2007  . RECTAL BLEEDING 02/04/2008    no recent problems  . BENIGN PROSTATIC HYPERTROPHY 04/23/2007    resolved after TURP  . OSTEOARTHRITIS 04/23/2007  . Palpitations 02/04/2008  . CHEST PAIN 02/04/2008  . NAUSEA 12/29/2008    ? med related, no current problems  . NEPHROLITHIASIS, HX OF 04/23/2007    pt denies this   . Cellulitis june 2013    left side("left flank")  . PONV (postoperative nausea and vomiting)   . H/O blurred vision     both eyes, occasional  . Neuropathy of left foot   . Kidney stone   . Pneumonia   . Headache     Social History   Social History  . Marital Status: Married    Spouse Name: N/A  . Number of Children: N/A  . Years of Education: N/A   Occupational History  . Not on file.   Social History Main Topics  . Smoking status: Never Smoker   . Smokeless tobacco: Never Used  . Alcohol Use: No  . Drug Use: No  . Sexual Activity: Yes   Other Topics Concern  . Not on file   Social History Narrative    Past Surgical History  Procedure Laterality Date  . Appendectomy    . Hernia repair    . Lithotripsy  1992    cystoscopy with stent  placement also done(1 stone fragment remains)  . Right shoulder replacement  Sep 28, 2009  . Left foot surgery for fx  Jul 30, 2011  . Transurethral resection of prostate  03/27/2012    Procedure: TRANSURETHRAL RESECTION OF THE PROSTATE WITH GYRUS INSTRUMENTS;  Surgeon: Claybon Jabs, MD;  Location: [facility];  Service: Urology;;  . Total knee arthroplasty Right 08/30/2013    Procedure: RIGHT TOTAL KNEE ARTHROPLASTY RIGHT ;  Surgeon: Gearlean Alf, MD;  Location: [facility];  Service: Orthopedics;  Laterality: Right;  . Colonoscopy    . Total shoulder arthroplasty Left 02/02/2015    Procedure: LEFT TOTAL SHOULDER ARTHROPLASTY;  Surgeon: Justice Britain, MD;  Location: [place];  Service: Orthopedics;  Laterality: Left;    Family History  Problem Relation Age of Onset  . Heart disease Mother 85    MI  . Stroke Father 14  . COPD Sister   . Diabetes Sister   . Diabetes Brother     No Known Allergies  Current Outpatient Prescriptions on File Prior to Visit  Medication Sig Dispense Refill  . aspirin 81 MG tablet Take 81 mg by mouth daily.    . B Complex Vitamins (VITAMIN-B COMPLEX PO) Take by mouth daily.    Marland Kitchen  benazepril-hydrochlorthiazide (LOTENSIN HCT) 20-12.5 MG tablet TAKE 1 BY MOUTH EVERY MORNING 90 tablet 1  . celecoxib (CELEBREX) 200 MG capsule Take 1 capsule (200 mg total) by mouth daily. 90 capsule 1  . Cholecalciferol (VITAMIN D3) 5000 UNITS CAPS Take 1,000 Units by mouth daily.    . Coenzyme Q10 (CO Q 10 PO) Take 1 tablet by mouth daily.     . fluticasone (FLONASE) 50 MCG/ACT nasal spray Place 1 spray into both nostrils daily. 32 g 5  . gabapentin (NEURONTIN) 600 MG tablet TAKE 1 BY MOUTH 3 TIMES DAILY 270 tablet 1  . Glucosamine-Chondroit-Vit C-Mn (GLUCOSAMINE CHONDR 1500 COMPLX) CAPS Take 1 capsule by mouth daily.    . hydroxypropyl methylcellulose / hypromellose (ISOPTO TEARS / GONIOVISC) 2.5 % ophthalmic solution Place 1 drop into both eyes as needed for dry eyes.    Marland Kitchen loratadine  (CLARITIN) 10 MG tablet Take 10 mg by mouth every evening.     . Methylsulfonylmethane (MSM PO) Take 300 mg by mouth daily.    . Multiple Vitamin (MULTIVITAMIN) tablet Take 1 tablet by mouth daily.    . potassium citrate (UROCIT-K) 10 MEQ (1080 MG) SR tablet TAKE 2 BY MOUTH TWICE DAILY WITH A MEAL 400 tablet 1  . psyllium (HYDROCIL/METAMUCIL) 95 % PACK Take 2 packets by mouth daily.     . simvastatin (ZOCOR) 40 MG tablet TAKE 1 BY MOUTH AT BEDTIME 90 tablet 1  . tadalafil (CIALIS) 5 MG tablet 1 tablet daily for BPH symptoms as well as ED (Patient taking differently: Take 5 mg by mouth daily as needed for erectile dysfunction (BPH). 1 tablet daily for BPH symptoms as well as E) 30 tablet 6   No current facility-administered medications on file prior to visit.    Temp(Src) 98 F (36.7 C) (Oral)  Ht 5' 8.5" (1.74 m)  Wt 198 lb (89.812 kg)  BMI 29.66 kg/m2     Here for Medicare AWV:   1. Risk factors based on Past M, S, F history: risk factors include hypertension, dyslipidemia, and a family history of cardiac disease  2. Physical Activities: remains quite active. He has 5 horses  and rides daily, also walks frequently.  Uses a stationary bike 45 minutes each day  3. Depression/mood: no history of depression or mood disorder  4. Hearing: mild sensorineural hearing loss, left Ear deafness 5. ADL's: independent in all aspects of daily living  6. Fall Risk: low  7. Home Safety: no problems identified  8. Height, weight, &visual acuity:height and weight normal and unchanged. No difficulty with visual acuity  9. Counseling: heart healthy diet and regular. Exercise encouraged as well as modest weight loss  10. Labs ordered based on risk factors: average her profile, including lipid profile will be reviewed  11. Referral Coordination- follow-up urology  12. Care Plan- heart healthy diet and weight loss, exercise regimen discussed and encouraged  13. Cognitive Assessment- alert and oriented  normal affect. No cognitive dysfunction; handles all executive functions without difficulty  14.  Preventive services will include annual health assessments.  Patient was provided with a written and personalized care plan 15.  Provider list updated.  Includes primary care medicine, orthopedics and urology as well as ophthalmology   Allergies (verified) :  No Known Drug Allergies   Past History:  Past Medical History:   Hyperlipidemia  Nephrolithiasis, hx of  Osteoarthritis  Benign prostatic hypertrophy  Allergic rhinitis  systolic hypertension  external hemorrhoids  Hypertension   Past Surgical  History:   Appendectomy  Inguinal herniorrhaphy  Lithotripsy  colonoscopy 2004  ETT 1-06  Cardiolye 6-09  TURP in July 2013  Family History:   father died at 42, cerebrovascular disease  mother died at age 38, MI  Three  sisters two brothers, positive for diabetes; one sister with COPD who is deceased; one brother  died of suicide death   Social History:   Married   Quite active on a horse [place]    Review of Systems  Constitutional: Negative for fever, chills, activity change, appetite change and fatigue.  HENT: Negative for congestion, dental problem, ear pain, hearing loss, mouth sores, rhinorrhea, sinus pressure, sneezing, tinnitus, trouble swallowing and voice change.   Eyes: Negative for photophobia, pain, redness and visual disturbance.  Respiratory: Negative for apnea, cough, choking, chest tightness, shortness of breath and wheezing.   Cardiovascular: Negative for chest pain, palpitations and leg swelling.  Gastrointestinal: Negative for nausea, vomiting, abdominal pain, diarrhea, constipation, blood in stool, abdominal distention, anal bleeding and rectal pain.  Genitourinary: Negative for dysuria, urgency, frequency, hematuria, flank pain, decreased urine volume, discharge, penile swelling, scrotal swelling, difficulty urinating, genital sores and testicular pain.   Musculoskeletal: Negative for myalgias, back pain, joint swelling, arthralgias, gait problem, neck pain and neck stiffness.  Skin: Negative for color change, rash and wound.  Neurological: Negative for dizziness, tremors, seizures, syncope, facial asymmetry, speech difficulty, weakness, light-headedness, numbness and headaches.  Hematological: Negative for adenopathy. Does not bruise/bleed easily.  Psychiatric/Behavioral: Negative for suicidal ideas, hallucinations, behavioral problems, confusion, sleep disturbance, self-injury, dysphoric mood, decreased concentration and agitation. The patient is not nervous/anxious.        Objective:   Physical Exam  Constitutional: He appears well-developed and well-nourished.  HENT:  Head: Normocephalic and atraumatic.  Right Ear: External ear normal.  Left Ear: External ear normal.  Nose: Nose normal.  Mouth/Throat: Oropharynx is clear and moist.  Eyes: Conjunctivae and EOM are normal. Pupils are equal, round, and reactive to light. No scleral icterus.  Neck: Normal range of motion. Neck supple. No JVD present. No thyromegaly present.  Cardiovascular: Regular rhythm, normal heart sounds and intact distal pulses.  Exam reveals no gallop and no friction rub.   No murmur heard. Pulmonary/Chest: Effort normal and breath sounds normal. He exhibits no tenderness.  Abdominal: Soft. Bowel sounds are normal. He exhibits no distension and no mass. There is no tenderness.  Genitourinary: Penis normal.  Musculoskeletal: Normal range of motion. He exhibits no edema or tenderness.  Right knee brace  Lymphadenopathy:    He has no cervical adenopathy.  Neurological: He is alert. He has normal reflexes. No cranial nerve deficit. Coordination normal.  Skin: Skin is warm and dry. No rash noted.  Psychiatric: He has a normal mood and affect. His behavior is normal.   Surgical scars both shoulders Right total knee replacement therapy Uncircumcised Posterior  tibial pulses not easily palpable       Assessment & Plan:    Preventive health examination Hypertension well controlled Dyslipidemia well controlled. We'll continue simvastatin therapy BPH status post TURP.  Impaired glucose tolerance Deafness, left ear   Recheck 6-12  months Urology followup as scheduled Laboratory profile reviewed   Nyoka Cowden, MD

## 2016-04-01 NOTE — Progress Notes (Signed)
Pre visit review using our clinic review tool, if applicable. No additional management support is needed unless otherwise documented below in the visit note. 

## 2016-04-01 NOTE — Patient Instructions (Addendum)
Limit your sodium (Salt) intake  Please check your blood pressure on a regular basis.  If it is consistently greater than 150/90, please make an office appointment.  Return in 6 months for follow-up    It is important that you exercise regularly, at least 20 minutes 3 to 4 times per week.  If you develop chest pain or shortness of breath seek  medical attention.   

## 2016-04-02 ENCOUNTER — Other Ambulatory Visit: Payer: Self-pay | Admitting: Internal Medicine

## 2016-04-02 NOTE — Telephone Encounter (Signed)
Refill sent to pharmacy.   

## 2016-05-06 ENCOUNTER — Encounter: Payer: Self-pay | Admitting: Internal Medicine

## 2016-05-13 MED ORDER — CELECOXIB 200 MG PO CAPS: 200.0000 mg | ORAL_CAPSULE | Freq: Every day | ORAL | 1 refills | Status: DC

## 2016-05-13 NOTE — Telephone Encounter (Signed)
Spoke to pt, told him Rx for Celebrex is ready for pickup will be at the front desk. Rx printed and signed by Dr. Raliegh Ip.

## 2016-06-12 DIAGNOSIS — H35033 Hypertensive retinopathy, bilateral: Secondary | ICD-10-CM | POA: Diagnosis not present

## 2016-06-12 DIAGNOSIS — H524 Presbyopia: Secondary | ICD-10-CM | POA: Diagnosis not present

## 2016-06-14 DIAGNOSIS — H60333 Swimmer's ear, bilateral: Secondary | ICD-10-CM | POA: Insufficient documentation

## 2016-06-14 DIAGNOSIS — H9311 Tinnitus, right ear: Secondary | ICD-10-CM | POA: Diagnosis not present

## 2016-06-14 DIAGNOSIS — H903 Sensorineural hearing loss, bilateral: Secondary | ICD-10-CM | POA: Insufficient documentation

## 2016-06-14 DIAGNOSIS — H9313 Tinnitus, bilateral: Secondary | ICD-10-CM | POA: Diagnosis not present

## 2016-06-14 HISTORY — DX: Swimmer's ear, bilateral: H60.333

## 2016-06-14 HISTORY — DX: Sensorineural hearing loss, bilateral: H90.3

## 2016-06-24 ENCOUNTER — Other Ambulatory Visit: Payer: Self-pay | Admitting: Internal Medicine

## 2016-07-10 DIAGNOSIS — R972 Elevated prostate specific antigen [PSA]: Secondary | ICD-10-CM | POA: Diagnosis not present

## 2016-07-17 DIAGNOSIS — N401 Enlarged prostate with lower urinary tract symptoms: Secondary | ICD-10-CM | POA: Diagnosis not present

## 2016-07-17 DIAGNOSIS — R972 Elevated prostate specific antigen [PSA]: Secondary | ICD-10-CM | POA: Diagnosis not present

## 2016-07-26 DIAGNOSIS — R972 Elevated prostate specific antigen [PSA]: Secondary | ICD-10-CM | POA: Diagnosis not present

## 2016-09-18 ENCOUNTER — Other Ambulatory Visit: Payer: Self-pay | Admitting: Internal Medicine

## 2016-09-23 ENCOUNTER — Emergency Department (HOSPITAL_BASED_OUTPATIENT_CLINIC_OR_DEPARTMENT_OTHER)
Admission: EM | Admit: 2016-09-23 | Discharge: 2016-09-23 | Disposition: A | Discharge status: Home / Self Care | Payer: Medicare Other | Attending: Emergency Medicine | Admitting: Emergency Medicine

## 2016-09-23 ENCOUNTER — Ambulatory Visit: Payer: Medicare Other | Admitting: Internal Medicine

## 2016-09-23 ENCOUNTER — Encounter (HOSPITAL_BASED_OUTPATIENT_CLINIC_OR_DEPARTMENT_OTHER): Payer: Self-pay | Admitting: *Deleted

## 2016-09-23 ENCOUNTER — Emergency Department (HOSPITAL_BASED_OUTPATIENT_CLINIC_OR_DEPARTMENT_OTHER): Payer: Medicare Other

## 2016-09-23 DIAGNOSIS — R05 Cough: Secondary | ICD-10-CM | POA: Diagnosis not present

## 2016-09-23 DIAGNOSIS — Z79899 Other long term (current) drug therapy: Secondary | ICD-10-CM | POA: Diagnosis not present

## 2016-09-23 DIAGNOSIS — I10 Essential (primary) hypertension: Secondary | ICD-10-CM | POA: Insufficient documentation

## 2016-09-23 DIAGNOSIS — J40 Bronchitis, not specified as acute or chronic: Secondary | ICD-10-CM | POA: Diagnosis not present

## 2016-09-23 DIAGNOSIS — Z7982 Long term (current) use of aspirin: Secondary | ICD-10-CM | POA: Diagnosis not present

## 2016-09-23 MED ORDER — ALBUTEROL SULFATE HFA 108 (90 BASE) MCG/ACT IN AERS: 1.0000 | INHALATION_SPRAY | Freq: Four times a day (QID) | RESPIRATORY_TRACT | 0 refills | Status: DC | PRN

## 2016-09-23 MED ORDER — IPRATROPIUM BROMIDE 0.02 % IN SOLN
0.5000 mg | Freq: Once | RESPIRATORY_TRACT | Status: AC
  Administered 2016-09-23: 0.5 mg via RESPIRATORY_TRACT
  Filled 2016-09-23: qty 2.5

## 2016-09-23 MED ORDER — ALBUTEROL SULFATE (2.5 MG/3ML) 0.083% IN NEBU
5.0000 mg | INHALATION_SOLUTION | Freq: Once | RESPIRATORY_TRACT | Status: AC
  Administered 2016-09-23: 5 mg via RESPIRATORY_TRACT
  Filled 2016-09-23: qty 6

## 2016-09-23 MED ORDER — AZITHROMYCIN 250 MG PO TABS: 250.0000 mg | ORAL_TABLET | Freq: Every day | ORAL | 0 refills | Status: DC

## 2016-09-23 MED ORDER — BENZONATATE 100 MG PO CAPS: 100.0000 mg | ORAL_CAPSULE | Freq: Three times a day (TID) | ORAL | 0 refills | Status: DC

## 2016-09-23 MED ORDER — PREDNISONE 50 MG PO TABS
60.0000 mg | ORAL_TABLET | Freq: Once | ORAL | Status: AC
  Administered 2016-09-23: 60 mg via ORAL
  Filled 2016-09-23: qty 1

## 2016-09-23 MED ORDER — PREDNISONE 20 MG PO TABS: ORAL_TABLET | ORAL | 0 refills | Status: DC

## 2016-09-23 MED FILL — predniSONE 20 MG TABS: 20 | 9 days supply | Qty: 12 | Fill #0

## 2016-09-23 MED FILL — PROAIR HFA 90 MCG INHALER: 108 (90 BAS | 25 days supply | Qty: 9 | Fill #0

## 2016-09-23 MED FILL — BENZONATATE 100 MG CAPSULE: 100 | 7 days supply | Qty: 21 | Fill #0

## 2016-09-23 MED FILL — AZITHROMYCIN 250 MG TABLET: 250 | 5 days supply | Qty: 6 | Fill #0

## 2016-09-23 NOTE — Discharge Instructions (Signed)
Take the prescribed medication as directed-- start prednisone tomorrow, you have already had today's dose.  Use inhaler as needed for wheezing.  Can use your codeine at night to help with cough if desired. Follow-up with your primary care doctor. Return to the ED for new or worsening symptoms.

## 2016-09-23 NOTE — ED Provider Notes (Signed)
[facility] Provider Note   CSN: [id number] Arrival date & time: 09/23/16  0940     History   Chief Complaint Chief Complaint  Patient presents with  . Cough    HPI [name] [name] is a 81 y.o. male.  The history is provided by the patient, medical records and the spouse.  Cough  Associated symptoms include sore throat.     81 year old male with history of BPH, hyperlipidemia, hypertension, kidney stones, presenting to the ED for URI type symptoms for the past 4 days. Patient's wife was sick with similar symptoms about 2 weeks ago. States he began feeling bad 4 days ago on Friday. States initially he had a scratchy/sore throat and a dry cough, but upon waking this morning cough was productive with discolored mucus. Also reports some nasal congestion. Endorses low-grade fever at home. He has been taking some Sudafed without improvement. He did take a hydrocodone last night as he has learned in the past for this helps with cough. He denies any chest pain. States he did notice some wheezing last night. He has no history of asthma or COPD. He is not a smoker.  Past Medical History:  Diagnosis Date  . ALLERGIC RHINITIS 04/23/2007  . BENIGN PROSTATIC HYPERTROPHY 04/23/2007   resolved after TURP  . Cellulitis june 2013   left side("left flank")  . CHEST PAIN 02/04/2008  . H/O blurred vision    both eyes, occasional  . Headache   . HYPERLIPIDEMIA 04/23/2007  . HYPERTENSION 06/02/2008  . Kidney stone   . NAUSEA 12/29/2008   ? med related, no current problems  . NEPHROLITHIASIS, HX OF 04/23/2007   pt denies this   . Neuropathy of left foot   . OSTEOARTHRITIS 04/23/2007  . Palpitations 02/04/2008  . Pneumonia   . PONV (postoperative nausea and vomiting)   . RECTAL BLEEDING 02/04/2008   no recent problems  . 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 URI 09/01/2008    Patient Active Problem List   Diagnosis Date Noted  . Impaired glucose tolerance 04/01/2016  . S/P  shoulder replacement 02/02/2015  . 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 OA (osteoarthritis) of knee 08/30/2013  . TINNITUS 02/28/2010  . NAUSEA 12/29/2008  . URI 09/01/2008  . Essential hypertension 06/02/2008  . RECTAL BLEEDING 02/04/2008  . PALPITATIONS 02/04/2008  . CHEST PAIN 02/04/2008  . Dyslipidemia 04/23/2007  . Allergic rhinitis 04/23/2007  . BENIGN PROSTATIC HYPERTROPHY 04/23/2007    Class: Present on Admission  . Osteoarthritis 04/23/2007  . NEPHROLITHIASIS, HX OF 04/23/2007    Past Surgical History:  Procedure Laterality Date  . APPENDECTOMY    . COLONOSCOPY    . HERNIA REPAIR    . left foot surgery for fx  Jul 30, 2011  . LITHOTRIPSY  1992   cystoscopy with stent placement also done(1 stone fragment remains)  . right shoulder replacement  Sep 28, 2009  . TOTAL KNEE ARTHROPLASTY Right 08/30/2013   Procedure: RIGHT TOTAL KNEE ARTHROPLASTY RIGHT ;  Surgeon: Gearlean Alf, MD;  Location: [facility];  Service: Orthopedics;  Laterality: Right;  . TOTAL SHOULDER ARTHROPLASTY Left 02/02/2015   Procedure: LEFT TOTAL SHOULDER ARTHROPLASTY;  Surgeon: Justice Britain, MD;  Location: [facility];  Service: Orthopedics;  Laterality: Left;  . TRANSURETHRAL RESECTION OF PROSTATE  03/27/2012   Procedure: TRANSURETHRAL RESECTION OF THE PROSTATE WITH GYRUS INSTRUMENTS;  Surgeon: Claybon Jabs, MD;  Location: [facility];  Service: Urology;;  Home Medications    Prior to Admission medications   Medication Sig Start Date End Date Taking? Authorizing Provider  aspirin 81 MG tablet Take 81 mg by mouth daily.   Yes Historical Provider, MD  benazepril-hydrochlorthiazide (LOTENSIN HCT) 20-12.5 MG tablet TAKE 1 TABLET BY MOUTH  EVERY MORNING 09/18/16  Yes Marletta Lor, MD  celecoxib (CELEBREX) 200 MG capsule Take 1 capsule (200 mg total) by mouth daily. 05/13/16  Yes Marletta Lor, MD  Cholecalciferol (VITAMIN D3) 5000 UNITS CAPS Take 1,000 Units by mouth daily.   Yes Historical  Provider, MD  Coenzyme Q10 (CO Q 10 PO) Take 1 tablet by mouth daily.    Yes Historical Provider, MD  fluticasone (FLONASE) 50 MCG/ACT nasal spray Place 1 spray into both nostrils daily. 10/26/15  Yes Marletta Lor, MD  gabapentin (NEURONTIN) 600 MG tablet TAKE 1 TABLET BY MOUTH 3  TIMES A DAY 09/18/16  Yes Marletta Lor, MD  Glucosamine-Chondroit-Vit C-Mn (GLUCOSAMINE CHONDR 1500 COMPLX) CAPS Take 1 capsule by mouth daily.   Yes Historical Provider, MD  loratadine (CLARITIN) 10 MG tablet Take 10 mg by mouth every evening.    Yes Historical Provider, MD  Methylsulfonylmethane (MSM PO) Take 300 mg by mouth daily.   Yes Historical Provider, MD  Multiple Vitamin (MULTIVITAMIN) tablet Take 1 tablet by mouth daily.   Yes Historical Provider, MD  potassium citrate (UROCIT-K) 10 MEQ (1080 MG) SR tablet TAKE 2 TABLETS BY MOUTH  TWICE DAILY WITH A MEAL 06/24/16  Yes Marletta Lor, MD  psyllium (HYDROCIL/METAMUCIL) 95 % PACK Take 2 packets by mouth daily.    Yes Historical Provider, MD  simvastatin (ZOCOR) 40 MG tablet TAKE 1 TABLET BY MOUTH AT  BEDTIME 09/18/16  Yes Marletta Lor, MD  B Complex Vitamins (VITAMIN-B COMPLEX PO) Take by mouth daily.    Historical Provider, MD  hydroxypropyl methylcellulose / hypromellose (ISOPTO TEARS / GONIOVISC) 2.5 % ophthalmic solution Place 1 drop into both eyes as needed for dry eyes.    Historical Provider, MD  tadalafil (CIALIS) 5 MG tablet 1 tablet daily for BPH symptoms as well as ED Patient taking differently: Take 5 mg by mouth daily as needed for erectile dysfunction (BPH). 1 tablet daily for BPH symptoms as well as E 09/13/14   Marletta Lor, MD    Family History Family History  Problem Relation Age of Onset  . Heart disease Mother 57    MI  . Stroke Father 75  . COPD Sister   . Diabetes Sister   . Diabetes Brother     Social History Social History  Substance Use Topics  . Smoking status: Never Smoker  . Smokeless tobacco:  Never Used  . Alcohol use No     Allergies   Patient has no known allergies.   Review of Systems Review of Systems  HENT: Positive for congestion and sore throat.   Respiratory: Positive for cough.   All other systems reviewed and are negative.    Physical Exam Updated Vital Signs BP 141/65 (BP Location: Left Arm)   Pulse 82   Temp 98.2 F (36.8 C) (Oral)   Resp 18   Ht 5\' 10"  (1.778 m)   Wt 90.7 kg   SpO2 94%   BMI 28.70 kg/m   Physical Exam  Constitutional: He is oriented to person, place, and time. He appears well-developed and well-nourished.  HENT:  Head: Normocephalic and atraumatic.  Right Ear: Tympanic membrane and ear  canal normal.  Left Ear: Tympanic membrane and ear canal normal.  Nose: Mucosal edema present.  Mouth/Throat: Uvula is midline and mucous membranes are normal. Posterior oropharyngeal erythema (mild) present.  Eyes: Conjunctivae and EOM are normal. Pupils are equal, round, and reactive to light.  Neck: Normal range of motion.  Cardiovascular: Normal rate, regular rhythm and normal heart sounds.   Pulmonary/Chest: Effort normal. He has wheezes.  Diffuse expiratory wheezes throughout, normal work of breathing, no distress, able to speak in sentences without difficulty  Abdominal: Soft. Bowel sounds are normal.  Musculoskeletal: Normal range of motion.  Neurological: He is alert and oriented to person, place, and time.  Skin: Skin is warm and dry.  Psychiatric: He has a normal mood and affect.  Nursing note and vitals reviewed.    ED Treatments / Results  Labs (all labs ordered are listed, but only abnormal results are displayed) Labs Reviewed - No data to display  EKG  EKG Interpretation None       Radiology Dg Chest 2 View  Result Date: 09/23/2016 CLINICAL DATA:  Cough, congestion, and sore throat since 09/20/2016, wheezing, history hypertension, hyperlipidemia EXAM: CHEST  2 VIEW COMPARISON:  09/02/2013 FINDINGS: Normal heart  size, mediastinal contours, and pulmonary vascularity. Minimal chronic bronchitic changes. Lungs otherwise clear. No pleural effusion or pneumothorax. Bones appear demineralized with BILATERAL shoulder prostheses noted. Mild scattered endplate spur formation thoracic spine. IMPRESSION: Minimal chronic bronchitic changes. No acute abnormalities. Electronically Signed   By: Lavonia Dana M.D.   On: 09/23/2016 10:37    Procedures Procedures (including critical care time)  Medications Ordered in ED Medications  albuterol (PROVENTIL) (2.5 MG/3ML) 0.083% nebulizer solution 5 mg (5 mg Nebulization Given 09/23/16 1010)  ipratropium (ATROVENT) nebulizer solution 0.5 mg (0.5 mg Nebulization Given 09/23/16 1010)  predniSONE (DELTASONE) tablet 60 mg (60 mg Oral Given 09/23/16 1008)     Initial Impression / Assessment and Plan / ED Course  I have reviewed the triage vital signs and the nursing notes.  Pertinent labs & imaging results that were available during my care of the patient were reviewed by me and considered in my medical decision making (see chart for details).  Clinical Course    81 year old male here with cough and sore throat for the past 4 days. He denies any chest pain. On exam he is afebrile and nontoxic. He does have diffuse expiratory wheezes throughout but is in no acute respiratory distress. His vitals are stable on room air. Chest x-ray does reveal bronchitis which correlates clinically. After treatment with prednisone and albuterol/Atrovent neb, lung sounds have cleared. Patient reports he is feeling significantly better. VS remain stable.  Given his age and comorbidities, will cover with antibiotics. Continue prednisone taper, albuterol inhaler, Tessalon Perles cough. He does have PCP follow-up.  Discussed plan with patient and wife, they both acknowledged understanding and agreed with plan of care.  Return precautions given for new or worsening symptoms.  Final Clinical Impressions(s) / ED  Diagnoses   Final diagnoses:  Bronchitis    New Prescriptions Discharge Medication List as of 09/23/2016 11:26 AM    START taking these medications   Details  albuterol (PROVENTIL HFA;VENTOLIN HFA) 108 (90 Base) MCG/ACT inhaler Inhale 1-2 puffs into the lungs every 6 (six) hours as needed for wheezing., Starting Mon 09/23/2016, Print    azithromycin (ZITHROMAX) 250 MG tablet Take 1 tablet (250 mg total) by mouth daily. Take first 2 tablets together, then 1 every day until finished., Starting Mon  09/23/2016, Print    benzonatate (TESSALON) 100 MG capsule Take 1 capsule (100 mg total) by mouth every 8 (eight) hours., Starting Mon 09/23/2016, Print    predniSONE (DELTASONE) 20 MG tablet Take 40 mg by mouth daily for 3 days, then 20mg  by mouth daily for 3 days, then 10mg  daily for 3 days, Print         Larene Pickett, PA-C 09/23/16 [place], MD 09/23/16 1504

## 2016-09-23 NOTE — ED Triage Notes (Signed)
Cough and sore throat since friday

## 2016-10-01 ENCOUNTER — Ambulatory Visit (INDEPENDENT_AMBULATORY_CARE_PROVIDER_SITE_OTHER): Payer: Medicare Other | Admitting: Internal Medicine

## 2016-10-01 ENCOUNTER — Encounter: Payer: Self-pay | Admitting: Internal Medicine

## 2016-10-01 VITALS — BP 146/70 | HR 71 | Temp 98.2°F | Ht 70.0 in | Wt 204.4 lb

## 2016-10-01 DIAGNOSIS — M150 Primary generalized (osteo)arthritis: Secondary | ICD-10-CM

## 2016-10-01 DIAGNOSIS — M159 Polyosteoarthritis, unspecified: Secondary | ICD-10-CM

## 2016-10-01 DIAGNOSIS — J069 Acute upper respiratory infection, unspecified: Secondary | ICD-10-CM | POA: Diagnosis not present

## 2016-10-01 DIAGNOSIS — I10 Essential (primary) hypertension: Secondary | ICD-10-CM

## 2016-10-01 NOTE — Progress Notes (Signed)
Pre visit review using our clinic review tool, if applicable. No additional management support is needed unless otherwise documented below in the visit note. 

## 2016-10-01 NOTE — Progress Notes (Signed)
Subjective:    Patient ID: [name] [name], male    DOB: 1936-05-31, 81 y.o.   MRN: [id number]  HPI 81 year old patient who is seen today for his six-month follow-up. He was seen in the ED 8 days ago with URI with cough and congestion.  He is completing a prednisone Dosepak and was treated with azithromycin.  He continues to improve. He has essential hypertension and a history of osteoarthritis.  He has a history of allergic rhinitis.  He has dyslipidemia and remains on statin therapy. No new concerns or complaints.    Past Medical History:  Diagnosis Date  . ALLERGIC RHINITIS 04/23/2007  . BENIGN PROSTATIC HYPERTROPHY 04/23/2007   resolved after TURP  . Cellulitis june 2013   left side("left flank")  . CHEST PAIN 02/04/2008  . H/O blurred vision    both eyes, occasional  . Headache   . HYPERLIPIDEMIA 04/23/2007  . HYPERTENSION 06/02/2008  . Kidney stone   . NAUSEA 12/29/2008   ? med related, no current problems  . NEPHROLITHIASIS, HX OF 04/23/2007   pt denies this   . Neuropathy of left foot   . OSTEOARTHRITIS 04/23/2007  . Palpitations 02/04/2008  . Pneumonia   . PONV (postoperative nausea and vomiting)   . RECTAL BLEEDING 02/04/2008   no recent problems  . 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 URI 09/01/2008     Social History   Social History  . Marital status: Married    Spouse name: N/A  . Number of children: N/A  . Years of education: N/A   Occupational History  . Not on file.   Social History Main Topics  . Smoking status: Never Smoker  . Smokeless tobacco: Never Used  . Alcohol use No  . Drug use: No  . Sexual activity: Yes   Other Topics Concern  . Not on file   Social History Narrative  . No narrative on file    Past Surgical History:  Procedure Laterality Date  . APPENDECTOMY    . COLONOSCOPY    . HERNIA REPAIR    . left foot surgery for fx  Jul 30, 2011  . LITHOTRIPSY  1992   cystoscopy with stent placement also done(1 stone  fragment remains)  . right shoulder replacement  Sep 28, 2009  . TOTAL KNEE ARTHROPLASTY Right 08/30/2013   Procedure: RIGHT TOTAL KNEE ARTHROPLASTY RIGHT ;  Surgeon: Gearlean Alf, MD;  Location: [facility];  Service: Orthopedics;  Laterality: Right;  . TOTAL SHOULDER ARTHROPLASTY Left 02/02/2015   Procedure: LEFT TOTAL SHOULDER ARTHROPLASTY;  Surgeon: Justice Britain, MD;  Location: [facility];  Service: Orthopedics;  Laterality: Left;  . TRANSURETHRAL RESECTION OF PROSTATE  03/27/2012   Procedure: TRANSURETHRAL RESECTION OF THE PROSTATE WITH GYRUS INSTRUMENTS;  Surgeon: Claybon Jabs, MD;  Location: [facility];  Service: Urology;;    Family History  Problem Relation Age of Onset  . Heart disease Mother 66    MI  . Stroke Father 29  . COPD Sister   . Diabetes Sister   . Diabetes Brother     No Known Allergies  Current Outpatient Prescriptions on File Prior to Visit  Medication Sig Dispense Refill  . albuterol (PROVENTIL HFA;VENTOLIN HFA) 108 (90 Base) MCG/ACT inhaler Inhale 1-2 puffs into the lungs every 6 (six) hours as needed for wheezing. 1 Inhaler 0  . aspirin 81 MG tablet Take 81 mg by mouth daily.    . B Complex Vitamins (  VITAMIN-B COMPLEX PO) Take by mouth daily.    . benazepril-hydrochlorthiazide (LOTENSIN HCT) 20-12.5 MG tablet TAKE 1 TABLET BY MOUTH  EVERY MORNING 90 tablet 1  . celecoxib (CELEBREX) 200 MG capsule Take 1 capsule (200 mg total) by mouth daily. 90 capsule 1  . Cholecalciferol (VITAMIN D3) 5000 UNITS CAPS Take 1,000 Units by mouth daily.    . Coenzyme Q10 (CO Q 10 PO) Take 1 tablet by mouth daily.     . fluticasone (FLONASE) 50 MCG/ACT nasal spray Place 1 spray into both nostrils daily. 32 g 5  . gabapentin (NEURONTIN) 600 MG tablet TAKE 1 TABLET BY MOUTH 3  TIMES A DAY 270 tablet 0  . Glucosamine-Chondroit-Vit C-Mn (GLUCOSAMINE CHONDR 1500 COMPLX) CAPS Take 1 capsule by mouth daily.    . hydroxypropyl methylcellulose / hypromellose (ISOPTO TEARS / GONIOVISC) 2.5 %  ophthalmic solution Place 1 drop into both eyes as needed for dry eyes.    Marland Kitchen loratadine (CLARITIN) 10 MG tablet Take 10 mg by mouth every evening.     . Multiple Vitamin (MULTIVITAMIN) tablet Take 1 tablet by mouth daily.    . potassium citrate (UROCIT-K) 10 MEQ (1080 MG) SR tablet TAKE 2 TABLETS BY MOUTH  TWICE DAILY WITH A MEAL 400 tablet 1  . predniSONE (DELTASONE) 20 MG tablet Take 40 mg by mouth daily for 3 days, then 20mg  by mouth daily for 3 days, then 10mg  daily for 3 days 12 tablet 0  . psyllium (HYDROCIL/METAMUCIL) 95 % PACK Take 2 packets by mouth daily.     . simvastatin (ZOCOR) 40 MG tablet TAKE 1 TABLET BY MOUTH AT  BEDTIME 90 tablet 1  . tadalafil (CIALIS) 5 MG tablet 1 tablet daily for BPH symptoms as well as ED (Patient taking differently: Take 5 mg by mouth daily as needed for erectile dysfunction (BPH). 1 tablet daily for BPH symptoms as well as E) 30 tablet 6   No current facility-administered medications on file prior to visit.     BP (!) 146/70 (BP Location: Left Arm, Patient Position: Sitting, Cuff Size: Normal)   Pulse 71   Temp 98.2 F (36.8 C) (Oral)   Ht 5\' 10"  (1.778 m)   Wt 204 lb 6.1 oz (92.7 kg)   SpO2 97%   BMI 29.33 kg/m      Review of Systems  Constitutional: Negative for appetite change, chills, fatigue and fever.  HENT: Positive for hearing loss. Negative for congestion, dental problem, ear pain, sore throat, tinnitus, trouble swallowing and voice change.        Decrease hearing on the left.  Followed by ENT  Eyes: Negative for pain, discharge and visual disturbance.  Respiratory: Positive for cough. Negative for chest tightness, wheezing and stridor.   Cardiovascular: Negative for chest pain, palpitations and leg swelling.  Gastrointestinal: Negative for abdominal distention, abdominal pain, blood in stool, constipation, diarrhea, nausea and vomiting.  Genitourinary: Negative for difficulty urinating, discharge, flank pain, genital sores,  hematuria and urgency.  Musculoskeletal: Negative for arthralgias, back pain, gait problem, joint swelling, myalgias and neck stiffness.  Skin: Negative for rash.  Neurological: Negative for dizziness, syncope, speech difficulty, weakness, numbness and headaches.  Hematological: Negative for adenopathy. Does not bruise/bleed easily.  Psychiatric/Behavioral: Negative for behavioral problems and dysphoric mood. The patient is not nervous/anxious.        Objective:   Physical Exam  Constitutional: He is oriented to person, place, and time. He appears well-developed.  Blood pressure 140/70  HENT:  Head: Normocephalic.  Right Ear: External ear normal.  Left Ear: External ear normal.  Eyes: Conjunctivae and EOM are normal.  Neck: Normal range of motion.  Cardiovascular: Normal rate.   Murmur heard. Grade 2/6 systolic murmur loudest at the base  Pulmonary/Chest:  Few Scattered rhonchi O2 saturation 97  Abdominal: Bowel sounds are normal.  Musculoskeletal: Normal range of motion. He exhibits no edema or tenderness.  Neurological: He is alert and oriented to person, place, and time.  Psychiatric: He has a normal mood and affect. His behavior is normal.          Assessment & Plan:   Resolving URI/bronchitis. Essential hypertension, stable Dyslipidemia.  Continue statin therapy Osteoarthritis  No change in medical regimen Medicines updated CPX 6 months  Riot Barrick Pilar Plate

## 2016-10-01 NOTE — Patient Instructions (Signed)
Limit your sodium (Salt) intake  Please check your blood pressure on a regular basis.  If it is consistently greater than 150/90, please make an office appointment.    It is important that you exercise regularly, at least 20 minutes 3 to 4 times per week.  If you develop chest pain or shortness of breath seek  medical attention.  Return in 6 months for follow-up  

## 2016-11-08 ENCOUNTER — Other Ambulatory Visit: Payer: Self-pay | Admitting: Internal Medicine

## 2016-11-19 ENCOUNTER — Encounter: Payer: Self-pay | Admitting: Internal Medicine

## 2016-11-21 ENCOUNTER — Other Ambulatory Visit: Payer: Self-pay | Admitting: Internal Medicine

## 2016-11-21 MED ORDER — CELECOXIB 200 MG PO CAPS: 200.0000 mg | ORAL_CAPSULE | Freq: Every day | ORAL | 2 refills | Status: DC

## 2016-12-27 ENCOUNTER — Other Ambulatory Visit: Payer: Self-pay | Admitting: Internal Medicine

## 2017-03-12 ENCOUNTER — Emergency Department (HOSPITAL_BASED_OUTPATIENT_CLINIC_OR_DEPARTMENT_OTHER): Payer: Medicare Other

## 2017-03-12 ENCOUNTER — Emergency Department (HOSPITAL_BASED_OUTPATIENT_CLINIC_OR_DEPARTMENT_OTHER)
Admission: EM | Admit: 2017-03-12 | Discharge: 2017-03-12 | Disposition: A | Discharge status: Home / Self Care | Payer: Medicare Other | Attending: Emergency Medicine | Admitting: Emergency Medicine

## 2017-03-12 ENCOUNTER — Encounter: Payer: Self-pay | Admitting: Internal Medicine

## 2017-03-12 ENCOUNTER — Encounter (HOSPITAL_BASED_OUTPATIENT_CLINIC_OR_DEPARTMENT_OTHER): Payer: Self-pay | Admitting: *Deleted

## 2017-03-12 DIAGNOSIS — Z79899 Other long term (current) drug therapy: Secondary | ICD-10-CM | POA: Insufficient documentation

## 2017-03-12 DIAGNOSIS — R0789 Other chest pain: Secondary | ICD-10-CM | POA: Diagnosis not present

## 2017-03-12 DIAGNOSIS — Y999 Unspecified external cause status: Secondary | ICD-10-CM | POA: Diagnosis not present

## 2017-03-12 DIAGNOSIS — S20211A Contusion of right front wall of thorax, initial encounter: Secondary | ICD-10-CM

## 2017-03-12 DIAGNOSIS — S0990XA Unspecified injury of head, initial encounter: Secondary | ICD-10-CM | POA: Diagnosis not present

## 2017-03-12 DIAGNOSIS — S20221A Contusion of right back wall of thorax, initial encounter: Secondary | ICD-10-CM | POA: Insufficient documentation

## 2017-03-12 DIAGNOSIS — L821 Other seborrheic keratosis: Secondary | ICD-10-CM | POA: Diagnosis not present

## 2017-03-12 DIAGNOSIS — L814 Other melanin hyperpigmentation: Secondary | ICD-10-CM | POA: Diagnosis not present

## 2017-03-12 DIAGNOSIS — L570 Actinic keratosis: Secondary | ICD-10-CM | POA: Diagnosis not present

## 2017-03-12 DIAGNOSIS — Y929 Unspecified place or not applicable: Secondary | ICD-10-CM | POA: Diagnosis not present

## 2017-03-12 DIAGNOSIS — W5512XA Struck by horse, initial encounter: Secondary | ICD-10-CM | POA: Diagnosis not present

## 2017-03-12 DIAGNOSIS — S098XXA Other specified injuries of head, initial encounter: Secondary | ICD-10-CM | POA: Insufficient documentation

## 2017-03-12 DIAGNOSIS — S20311A Abrasion of right front wall of thorax, initial encounter: Secondary | ICD-10-CM | POA: Diagnosis not present

## 2017-03-12 DIAGNOSIS — Y9352 Activity, horseback riding: Secondary | ICD-10-CM | POA: Insufficient documentation

## 2017-03-12 DIAGNOSIS — D225 Melanocytic nevi of trunk: Secondary | ICD-10-CM | POA: Diagnosis not present

## 2017-03-12 DIAGNOSIS — Z7982 Long term (current) use of aspirin: Secondary | ICD-10-CM | POA: Insufficient documentation

## 2017-03-12 DIAGNOSIS — I10 Essential (primary) hypertension: Secondary | ICD-10-CM | POA: Diagnosis not present

## 2017-03-12 DIAGNOSIS — R51 Headache: Secondary | ICD-10-CM | POA: Diagnosis not present

## 2017-03-12 DIAGNOSIS — D2261 Melanocytic nevi of right upper limb, including shoulder: Secondary | ICD-10-CM | POA: Diagnosis not present

## 2017-03-12 DIAGNOSIS — R079 Chest pain, unspecified: Secondary | ICD-10-CM | POA: Diagnosis present

## 2017-03-12 DIAGNOSIS — D2262 Melanocytic nevi of left upper limb, including shoulder: Secondary | ICD-10-CM | POA: Diagnosis not present

## 2017-03-12 DIAGNOSIS — C44519 Basal cell carcinoma of skin of other part of trunk: Secondary | ICD-10-CM | POA: Diagnosis not present

## 2017-03-12 NOTE — ED Provider Notes (Signed)
[facility] Provider Note: Georgena Spurling, MD, FACEP  CSN: [id number] MRN: [id number] ARRIVAL: 03/12/17 at [place]: [place]  Head Injury   HISTORY OF PRESENT ILLNESS  [name] [name] is a 81 y.o. male who was kicked by a horse about 6 PM yesterday evening. The horse kicked him in the right side of the head and in the right lower chest and upper abdomen. He has an abrasion across his right lower chest and upper abdomen. There was no loss of consciousness. He denies nausea or vomiting. He denies neck pain. He denies other injury. He rates the pain in his chest is a 4 out of 10, worse with breathing or palpation. He denies abdominal pain.   Past Medical History:  Diagnosis Date  . ALLERGIC RHINITIS 04/23/2007  . BENIGN PROSTATIC HYPERTROPHY 04/23/2007   resolved after TURP  . Cellulitis june 2013   left side("left flank")  . CHEST PAIN 02/04/2008  . H/O blurred vision    both eyes, occasional  . Headache   . HYPERLIPIDEMIA 04/23/2007  . HYPERTENSION 06/02/2008  . Kidney stone   . NAUSEA 12/29/2008   ? med related, no current problems  . NEPHROLITHIASIS, HX OF 04/23/2007   pt denies this   . Neuropathy of left foot   . OSTEOARTHRITIS 04/23/2007  . Palpitations 02/04/2008  . Pneumonia   . PONV (postoperative nausea and vomiting)   . RECTAL BLEEDING 02/04/2008   no recent problems  . 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 URI 09/01/2008    Past Surgical History:  Procedure Laterality Date  . APPENDECTOMY    . COLONOSCOPY    . HERNIA REPAIR    . left foot surgery for fx  Jul 30, 2011  . LITHOTRIPSY  1992   cystoscopy with stent placement also done(1 stone fragment remains)  . right shoulder replacement  Sep 28, 2009  . TOTAL KNEE ARTHROPLASTY Right 08/30/2013   Procedure: RIGHT TOTAL KNEE ARTHROPLASTY RIGHT ;  Surgeon: Gearlean Alf, MD;  Location: [facility];  Service: Orthopedics;  Laterality: Right;  . TOTAL SHOULDER ARTHROPLASTY  Left 02/02/2015   Procedure: LEFT TOTAL SHOULDER ARTHROPLASTY;  Surgeon: Justice Britain, MD;  Location: [facility];  Service: Orthopedics;  Laterality: Left;  . TRANSURETHRAL RESECTION OF PROSTATE  03/27/2012   Procedure: TRANSURETHRAL RESECTION OF THE PROSTATE WITH GYRUS INSTRUMENTS;  Surgeon: Claybon Jabs, MD;  Location: [facility];  Service: Urology;;    Family History  Problem Relation Age of Onset  . Heart disease Mother 32       MI  . Stroke Father 58  . COPD Sister   . Diabetes Sister   . Diabetes Brother     Social History  Substance Use Topics  . Smoking status: Never Smoker  . Smokeless tobacco: Never Used  . Alcohol use No    Prior to Admission medications   Medication Sig Start Date End Date Taking? Authorizing Provider  albuterol (PROVENTIL HFA;VENTOLIN HFA) 108 (90 Base) MCG/ACT inhaler Inhale 1-2 puffs into the lungs every 6 (six) hours as needed for wheezing. 09/23/16   Larene Pickett, PA-C  aspirin 81 MG tablet Take 81 mg by mouth daily.    [provider]  B Complex Vitamins (VITAMIN-B COMPLEX PO) Take by mouth daily.    [provider]  benazepril-hydrochlorthiazide (LOTENSIN HCT) 20-12.5 MG tablet TAKE 1 TABLET BY MOUTH  EVERY MORNING 09/18/16   Marletta Lor,  MD  celecoxib (CELEBREX) 200 MG capsule Take 1 capsule (200 mg total) by mouth daily. 11/21/16   Marletta Lor, MD  Cholecalciferol (VITAMIN D3) 5000 UNITS CAPS Take 1,000 Units by mouth daily.    [provider]  Coenzyme Q10 (CO Q 10 PO) Take 1 tablet by mouth daily.     [provider]  fluticasone (FLONASE) 50 MCG/ACT nasal spray PLACE 1 SPRAY INTO BOTH  NOSTRILS DAILY. 11/08/16   Marletta Lor, MD  gabapentin (NEURONTIN) 600 MG tablet TAKE 1 TABLET BY MOUTH 3  TIMES A DAY 12/27/16   Marletta Lor, MD  Glucosamine-Chondroit-Vit C-Mn (GLUCOSAMINE CHONDR 1500 COMPLX) CAPS Take 1 capsule by mouth daily.    [provider]  hydroxypropyl  methylcellulose / hypromellose (ISOPTO TEARS / GONIOVISC) 2.5 % ophthalmic solution Place 1 drop into both eyes as needed for dry eyes.    [provider]  loratadine (CLARITIN) 10 MG tablet Take 10 mg by mouth every evening.     [provider]  Multiple Vitamin (MULTIVITAMIN) tablet Take 1 tablet by mouth daily.    [provider]  potassium citrate (UROCIT-K) 10 MEQ (1080 MG) SR tablet TAKE 2 TABLETS BY MOUTH  TWICE DAILY WITH A MEAL 06/24/16   Marletta Lor, MD  predniSONE (DELTASONE) 20 MG tablet Take 40 mg by mouth daily for 3 days, then 20mg  by mouth daily for 3 days, then 10mg  daily for 3 days 09/23/16   Larene Pickett, PA-C  psyllium (HYDROCIL/METAMUCIL) 95 % PACK Take 2 packets by mouth daily.     [provider]  simvastatin (ZOCOR) 40 MG tablet TAKE 1 TABLET BY MOUTH AT  BEDTIME 09/18/16   Marletta Lor, MD  tadalafil (CIALIS) 5 MG tablet 1 tablet daily for BPH symptoms as well as ED Patient taking differently: Take 5 mg by mouth daily as needed for erectile dysfunction (BPH). 1 tablet daily for BPH symptoms as well as E 09/13/14   Marletta Lor, MD    Allergies Patient has no known allergies.   REVIEW OF SYSTEMS  Negative except as noted here or in the History of Present Illness.   PHYSICAL EXAMINATION  Initial Vital Signs Blood pressure (!) 136/59, pulse 64, temperature 97.6 F (36.4 C), temperature source Oral, resp. rate 18, height 5\' 10"  (1.778 m), weight 90.7 kg (200 lb), SpO2 97 %.  Examination General: Well-developed, well-nourished male in no acute distress; appearance consistent with age of record HENT: normocephalic; TMs normal; mild tenderness right side of head Eyes: pupils equal, round and reactive to light; extraocular muscles intact Neck: supple; no C-spine tenderness Heart: regular rate and rhythm Lungs: clear to auscultation bilaterally Chest: Right sided parasternal rib tenderness without deformity or  crepitus Abdomen: soft; nondistended; nontender; bowel sounds present Extremities: No deformity; full range of motion; pulses normal Neurologic: Awake, alert and oriented; motor function intact in all extremities and symmetric; no facial droop Skin: Warm and dry; superficial abrasion across the right lower chest and right upper abdomen Psychiatric: Normal mood and affect   RESULTS  Summary of this visit's results, reviewed by myself:   EKG Interpretation  Date/Time:    Ventricular Rate:    PR Interval:    QRS Duration:   QT Interval:    QTC Calculation:   R Axis:     Text Interpretation:        Laboratory Studies: No results found for this or any previous visit (from the past  24 hour(s)). Imaging Studies: Dg Ribs Unilateral W/chest Right  Result Date: 03/12/2017 CLINICAL DATA:  Right anterior lower chest wall pain after being kicked by horse yesterday evening. EXAM: RIGHT RIBS AND CHEST - 3+ VIEW COMPARISON:  09/23/2016 FINDINGS: No fracture or other bone lesions are seen involving the ribs. There is no evidence of pneumothorax or pleural effusion. Both lungs are clear. Heart size and mediastinal contours are within normal limits. IMPRESSION: Negative. Electronically Signed   By: Andreas Newport M.D.   On: 03/12/2017 03:22   Ct Head Wo Contrast  Result Date: 03/12/2017 CLINICAL DATA:  81 y/o M; kicked by a horse. Right temporal headache. EXAM: CT HEAD WITHOUT CONTRAST TECHNIQUE: Contiguous axial images were obtained from the base of the skull through the vertex without intravenous contrast. COMPARISON:  05/15/2010 MRI of the head FINDINGS: Brain: No evidence of acute infarction, hemorrhage, hydrocephalus, extra-axial collection or mass lesion/mass effect. Few nonspecific foci of hypoattenuation in subcortical and periventricular white matter compatible with mild chronic microvascular ischemic changes. Mild brain parenchymal volume loss. Small lucencies in the right caudate head and  body as well as left lentiform nucleus are compatible with chronic lacunar infarcts or prominent perivascular spaces. Vascular: No hyperdense vessel or unexpected calcification. Skull: Normal. Negative for fracture or focal lesion. Sinuses/Orbits: Left maxillary sinus small mucous retention cyst. Mild mucosal thickening of ethmoid and left frontal sinuses. Normal aeration of mastoid air cells. Orbits are unremarkable. Other: None. IMPRESSION: 1. No acute intracranial abnormality or calvarial fracture. 2. Mild for age chronic microvascular ischemic changes and mild parenchymal volume loss of the brain. 3. Mild paranasal sinus disease. Electronically Signed   By: Kristine Garbe M.D.   On: 03/12/2017 03:15    ED COURSE  Nursing notes and initial vitals signs, including pulse oximetry, reviewed.  Vitals:   03/12/17 0227 03/12/17 0228  BP:  (!) 136/59  Pulse:  64  Resp:  18  Temp:  97.6 F (36.4 C)  TempSrc:  Oral  SpO2:  97%  Weight: 90.7 kg (200 lb)   Height: 5\' 10"  (1.778 m)     PROCEDURES    ED DIAGNOSES     ICD-10-CM   1. Struck by horse, initial encounter W55.12XA   2. Minor head injury without loss of consciousness, initial encounter S09.90XA   3. Chest wall contusion, right, initial encounter S20.211A   4. Abrasion of right chest wall, initial encounter S20.311A        Patirica Longshore, Jenny Reichmann, MD 03/12/17 [phone]

## 2017-03-12 NOTE — ED Triage Notes (Addendum)
Kicked to rt side of head and rt rib area around 1800 last pm by horse,  Abrasion to rt ribs area   Denies loc  states while sitting in chair sounded like bubbles in chest

## 2017-03-12 NOTE — ED Notes (Signed)
Patient transported to radiology

## 2017-03-21 ENCOUNTER — Other Ambulatory Visit: Payer: Self-pay | Admitting: Internal Medicine

## 2017-03-22 IMAGING — DX DG FINGER INDEX 2+V*R*
3 series · 3 of 3 positions shown · non-contrast
Comparison: None.

CLINICAL DATA: Redness and swelling for 1 day, possible foreign
body

EXAM:
RIGHT INDEX FINGER 2+V

[finger ap]
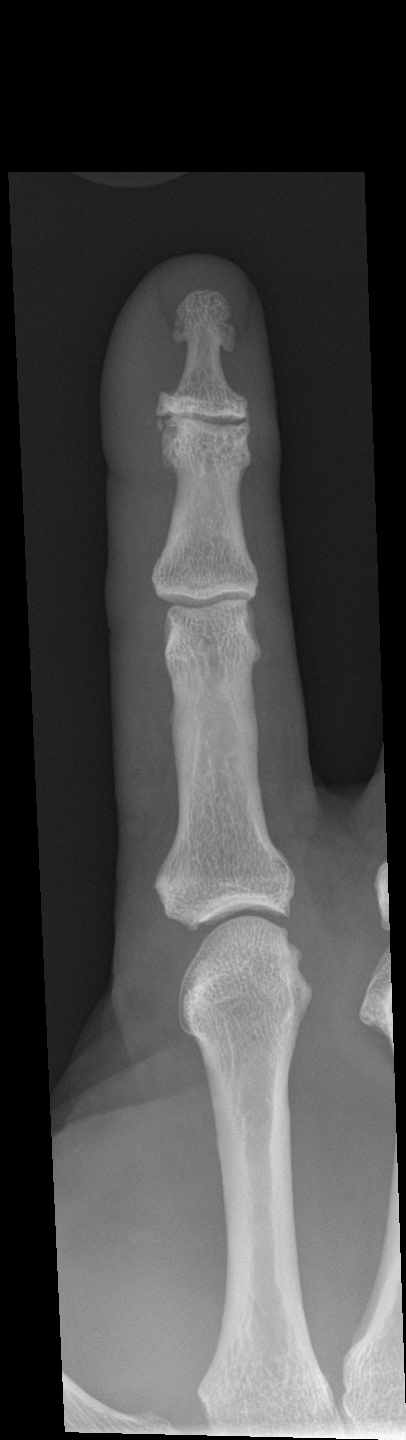

[finger obl]
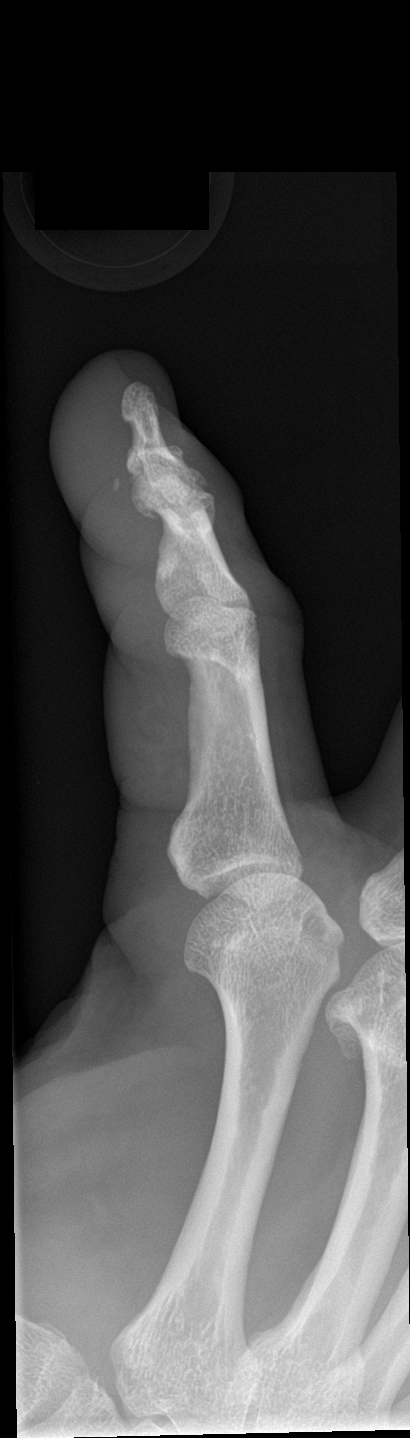

[finger lat]
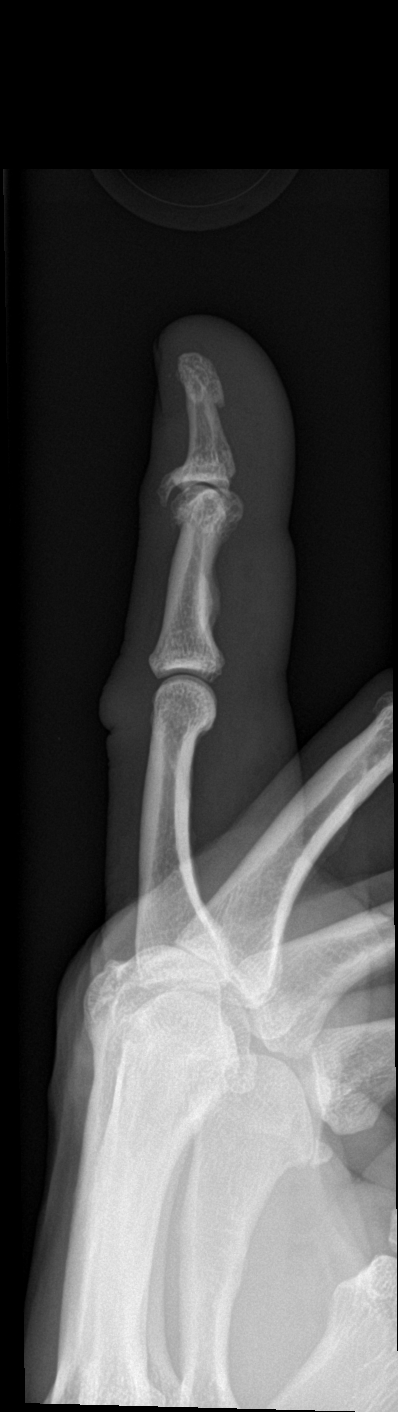

[3 of 3 positions shown; findings below may reference images not displayed]

FINDINGS: Degenerative changes are noted at the second DIP joint. No acute
fracture or dislocation is seen. No radiopaque foreign body is
noted.
IMPRESSION: Degenerative change without acute abnormality.

## 2017-04-02 ENCOUNTER — Other Ambulatory Visit (INDEPENDENT_AMBULATORY_CARE_PROVIDER_SITE_OTHER): Payer: Medicare Other

## 2017-04-02 DIAGNOSIS — I10 Essential (primary) hypertension: Secondary | ICD-10-CM | POA: Diagnosis not present

## 2017-04-02 DIAGNOSIS — E785 Hyperlipidemia, unspecified: Secondary | ICD-10-CM

## 2017-04-02 DIAGNOSIS — R7302 Impaired glucose tolerance (oral): Secondary | ICD-10-CM | POA: Diagnosis not present

## 2017-04-02 LAB — BASIC METABOLIC PANEL
BUN: 37 mg/dL — ABNORMAL HIGH (ref 6–23)
CALCIUM: 9.4 mg/dL (ref 8.4–10.5)
CO2: 26 meq/L (ref 19–32)
Chloride: 104 mEq/L (ref 96–112)
Creatinine, Ser: 0.86 mg/dL (ref 0.40–1.50)
GFR: 91.28 mL/min (ref >60.00)
Glucose, Bld: 107 mg/dL — ABNORMAL HIGH (ref 70–99)
POTASSIUM: 4.1 meq/L (ref 3.5–5.1)
SODIUM: 138 meq/L (ref 135–145)

## 2017-04-02 LAB — CBC WITH DIFFERENTIAL/PLATELET
Basophils Absolute: 0.1 10*3/uL (ref 0.0–0.1)
Basophils Relative: 0.8 % (ref 0.0–3.0)
EOS ABS: 0.3 10*3/uL (ref 0.0–0.7)
EOS PCT: 4.8 % (ref 0.0–5.0)
HCT: 41.5 % (ref 39.0–52.0)
Hemoglobin: 14.2 g/dL (ref 13.0–17.0)
LYMPHS ABS: 1.4 10*3/uL (ref 0.7–4.0)
Lymphocytes Relative: 19 % (ref 12.0–46.0)
MCHC: 34.3 g/dL (ref 30.0–36.0)
MCV: 90.4 fl (ref 78.0–100.0)
MONO ABS: 0.6 10*3/uL (ref 0.1–1.0)
Monocytes Relative: 8.3 % (ref 3.0–12.0)
NEUTROS PCT: 67.1 % (ref 43.0–77.0)
Neutro Abs: 4.8 10*3/uL (ref 1.4–7.7)
Platelets: 205 10*3/uL (ref 150.0–400.0)
RBC: 4.59 Mil/uL (ref 4.22–5.81)
RDW: 13.5 % (ref 11.5–15.5)
WBC: 7.2 10*3/uL (ref 4.0–10.5)

## 2017-04-02 LAB — POC URINALSYSI DIPSTICK (AUTOMATED)
BILIRUBIN UA: NEGATIVE
Blood, UA: NEGATIVE
GLUCOSE UA: NEGATIVE
Ketones, UA: NEGATIVE
LEUKOCYTES UA: NEGATIVE
NITRITE UA: NEGATIVE
Protein, UA: NEGATIVE
Spec Grav, UA: >=1.03 — AB (ref 1.010–1.025)
Urobilinogen, UA: 0.2 E.U./dL
pH, UA: 6 (ref 5.0–8.0)

## 2017-04-02 LAB — LIPID PANEL
Cholesterol: 143 mg/dL (ref 0–200)
HDL: 51 mg/dL (ref >39.00)
LDL Cholesterol: 78 mg/dL (ref 0–99)
NonHDL: 91.54
TRIGLYCERIDES: 66 mg/dL (ref 0.0–149.0)
Total CHOL/HDL Ratio: 3
VLDL: 13.2 mg/dL (ref 0.0–40.0)

## 2017-04-02 LAB — HEPATIC FUNCTION PANEL
ALK PHOS: 84 U/L (ref 39–117)
ALT: 19 U/L (ref 0–53)
AST: 19 U/L (ref 0–37)
Albumin: 4.4 g/dL (ref 3.5–5.2)
BILIRUBIN DIRECT: 0.2 mg/dL (ref 0.0–0.3)
TOTAL PROTEIN: 6.6 g/dL (ref 6.0–8.3)
Total Bilirubin: 1.2 mg/dL (ref 0.2–1.2)

## 2017-04-02 LAB — HEMOGLOBIN A1C: Hgb A1c MFr Bld: 5.7 % (ref 4.6–6.5)

## 2017-04-07 ENCOUNTER — Ambulatory Visit (INDEPENDENT_AMBULATORY_CARE_PROVIDER_SITE_OTHER): Payer: Medicare Other | Admitting: Internal Medicine

## 2017-04-07 ENCOUNTER — Encounter: Payer: Self-pay | Admitting: Internal Medicine

## 2017-04-07 VITALS — BP 140/70 | HR 76 | Temp 98.7°F | Ht 69.5 in | Wt 201.4 lb

## 2017-04-07 DIAGNOSIS — R7302 Impaired glucose tolerance (oral): Secondary | ICD-10-CM | POA: Diagnosis not present

## 2017-04-07 DIAGNOSIS — M150 Primary generalized (osteo)arthritis: Secondary | ICD-10-CM | POA: Diagnosis not present

## 2017-04-07 DIAGNOSIS — Z0000 Encounter for general adult medical examination without abnormal findings: Secondary | ICD-10-CM | POA: Diagnosis not present

## 2017-04-07 DIAGNOSIS — M159 Polyosteoarthritis, unspecified: Secondary | ICD-10-CM

## 2017-04-07 DIAGNOSIS — E785 Hyperlipidemia, unspecified: Secondary | ICD-10-CM | POA: Diagnosis not present

## 2017-04-07 DIAGNOSIS — J301 Allergic rhinitis due to pollen: Secondary | ICD-10-CM

## 2017-04-07 DIAGNOSIS — I10 Essential (primary) hypertension: Secondary | ICD-10-CM

## 2017-04-07 NOTE — Patient Instructions (Signed)
Limit your sodium (Salt) intake  Please check your blood pressure on a regular basis.  If it is consistently greater than 150/90, please make an office appointment.    It is important that you exercise regularly, at least 20 minutes 3 to 4 times per week.  If you develop chest pain or shortness of breath seek  medical attention.  Return in one year for follow-up  

## 2017-04-07 NOTE — Progress Notes (Signed)
Subjective:    Patient ID: [name] [name], male    DOB: November 30, 1935, 81 y.o.   MRN: [id number]  HPI  81 year old patient who is seen today for an annual preventive health examination and subsequent Medicare wellness visit.  Here for Medicare AWV:   1. Risk factors based on Past M, S, F history: risk factors include hypertension, dyslipidemia, and a family history of cardiac disease  2. Physical Activities: remains quite active. He has 5 horses  and rides daily, also walks frequently.  Uses a stationary bike 45 minutes each day Until recently when this activity was curtailed due to worsening right knee pain.  He does walk daily 3. Depression/mood: no history of depression or mood disorder  4. Hearing: mild sensorineural hearing loss, left Ear deafness 5. ADL's: independent in all aspects of daily living  6. Fall Risk: low  7. Home Safety: no problems identified  8. Height, weight, &visual acuity:height and weight normal and unchanged. No difficulty with visual acuity  9. Counseling: heart healthy diet and regular. Exercise encouraged as well as modest weight loss  10. Labs ordered based on risk factors: annual laboratory screen including lipid profile reviewed  11. Referral Coordination- follow-up urology  12. Care Plan- heart healthy diet and weight loss, exercise regimen discussed and encouraged  13. Cognitive Assessment- alert and oriented normal affect. No cognitive dysfunction; handles all executive functions without difficulty  14.  Preventive services will include annual health assessments.  Patient was provided with a written and personalized care plan 15.  Provider list updated.  Includes primary care medicine, orthopedics and urology as well as ophthalmology   Allergies (verified) :  No Known Drug Allergies   Past History:  Past Medical History:   Hyperlipidemia  Nephrolithiasis, hx of  Osteoarthritis  Benign prostatic hypertrophy  Allergic rhinitis  systolic  hypertension  external hemorrhoids  Hypertension   Past Surgical History:   Appendectomy  Inguinal herniorrhaphy  Lithotripsy  colonoscopy 2004  ETT 1-06  Cardiolye 6-09  TURP in July 2013  Family History:   father died at 27, cerebrovascular disease  mother died at age 76, MI  Three  sisters two brothers, positive for diabetes; one sister with COPD who is deceased; one brother  died of suicide death   Social History:   Married   Quite active on a horse farm   Past Medical History:  Diagnosis Date  . ALLERGIC RHINITIS 04/23/2007  . BENIGN PROSTATIC HYPERTROPHY 04/23/2007   resolved after TURP  . Cellulitis june 2013   left side("left flank")  . CHEST PAIN 02/04/2008  . H/O blurred vision    both eyes, occasional  . Headache   . HYPERLIPIDEMIA 04/23/2007  . HYPERTENSION 06/02/2008  . Kidney stone   . NAUSEA 12/29/2008   ? med related, no current problems  . NEPHROLITHIASIS, HX OF 04/23/2007   pt denies this   . Neuropathy of left foot   . OSTEOARTHRITIS 04/23/2007  . Palpitations 02/04/2008  . Pneumonia   . PONV (postoperative nausea and vomiting)   . RECTAL BLEEDING 02/04/2008   no recent problems  . 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 URI 09/01/2008     Social History   Social History  . Marital status: Married    Spouse name: N/A  . Number of children: N/A  . Years of education: N/A   Occupational History  . Not on file.   Social History Main Topics  . Smoking status:  Never Smoker  . Smokeless tobacco: Never Used  . Alcohol use No  . Drug use: No  . Sexual activity: Yes   Other Topics Concern  . Not on file   Social History Narrative  . No narrative on file    Past Surgical History:  Procedure Laterality Date  . APPENDECTOMY    . COLONOSCOPY    . HERNIA REPAIR    . left foot surgery for fx  Jul 30, 2011  . LITHOTRIPSY  1992   cystoscopy with stent placement also done(1 stone fragment remains)  . right shoulder  replacement  Sep 28, 2009  . TOTAL KNEE ARTHROPLASTY Right 08/30/2013   Procedure: RIGHT TOTAL KNEE ARTHROPLASTY RIGHT ;  Surgeon: Gearlean Alf, MD;  Location: [facility];  Service: Orthopedics;  Laterality: Right;  . TOTAL SHOULDER ARTHROPLASTY Left 02/02/2015   Procedure: LEFT TOTAL SHOULDER ARTHROPLASTY;  Surgeon: Justice Britain, MD;  Location: [facility];  Service: Orthopedics;  Laterality: Left;  . TRANSURETHRAL RESECTION OF PROSTATE  03/27/2012   Procedure: TRANSURETHRAL RESECTION OF THE PROSTATE WITH GYRUS INSTRUMENTS;  Surgeon: Claybon Jabs, MD;  Location: [facility];  Service: Urology;;    Family History  Problem Relation Age of Onset  . Heart disease Mother 77       MI  . Stroke Father 58  . COPD Sister   . Diabetes Sister   . Diabetes Brother     No Known Allergies  Current Outpatient Prescriptions on File Prior to Visit  Medication Sig Dispense Refill  . albuterol (PROVENTIL HFA;VENTOLIN HFA) 108 (90 Base) MCG/ACT inhaler Inhale 1-2 puffs into the lungs every 6 (six) hours as needed for wheezing. 1 Inhaler 0  . aspirin 81 MG tablet Take 81 mg by mouth daily.    . B Complex Vitamins (VITAMIN-B COMPLEX PO) Take by mouth daily.    . benazepril-hydrochlorthiazide (LOTENSIN HCT) 20-12.5 MG tablet TAKE 1 TABLET BY MOUTH  EVERY MORNING 90 tablet 1  . celecoxib (CELEBREX) 200 MG capsule Take 1 capsule (200 mg total) by mouth daily. 90 capsule 2  . Cholecalciferol (VITAMIN D3) 5000 UNITS CAPS Take 1,000 Units by mouth daily.    . Coenzyme Q10 (CO Q 10 PO) Take 1 tablet by mouth daily.     . fluticasone (FLONASE) 50 MCG/ACT nasal spray PLACE 1 SPRAY INTO BOTH  NOSTRILS DAILY. 32 g 3  . gabapentin (NEURONTIN) 600 MG tablet TAKE 1 TABLET BY MOUTH 3  TIMES A DAY 270 tablet 1  . Glucosamine-Chondroit-Vit C-Mn (GLUCOSAMINE CHONDR 1500 COMPLX) CAPS Take 1 capsule by mouth daily.    . hydroxypropyl methylcellulose / hypromellose (ISOPTO TEARS / GONIOVISC) 2.5 % ophthalmic solution Place 1 drop into  both eyes as needed for dry eyes.    Marland Kitchen loratadine (CLARITIN) 10 MG tablet Take 10 mg by mouth every evening.     . Multiple Vitamin (MULTIVITAMIN) tablet Take 1 tablet by mouth daily.    . potassium citrate (UROCIT-K) 10 MEQ (1080 MG) SR tablet TAKE 2 TABLETS BY MOUTH  TWICE DAILY WITH A MEAL 400 tablet 0  . psyllium (HYDROCIL/METAMUCIL) 95 % PACK Take 2 packets by mouth daily.     . simvastatin (ZOCOR) 40 MG tablet TAKE 1 TABLET BY MOUTH AT  BEDTIME 90 tablet 1   No current facility-administered medications on file prior to visit.     BP 140/70 (BP Location: Left Arm, Patient Position: Sitting, Cuff Size: Normal)   Pulse 76   Temp 98.7  F (37.1 C) (Oral)   Ht 5' 9.5" (1.765 m)   Wt 201 lb 6.4 oz (91.4 kg)   SpO2 98%   BMI 29.32 kg/m      Review of Systems  Constitutional: Negative for appetite change, chills, fatigue and fever.  HENT: Negative for congestion, dental problem, ear pain, hearing loss, sore throat, tinnitus, trouble swallowing and voice change.   Eyes: Negative for pain, discharge and visual disturbance.  Respiratory: Positive for cough. Negative for chest tightness, wheezing and stridor.   Cardiovascular: Negative for chest pain, palpitations and leg swelling.  Gastrointestinal: Negative for abdominal distention, abdominal pain, blood in stool, constipation, diarrhea, nausea and vomiting.  Genitourinary: Negative for difficulty urinating, discharge, flank pain, genital sores, hematuria and urgency.  Musculoskeletal: Positive for arthralgias. Negative for back pain, gait problem, joint swelling, myalgias and neck stiffness.       Right knee pain  Skin: Negative for rash.  Neurological: Negative for dizziness, syncope, speech difficulty, weakness, numbness and headaches.  Hematological: Negative for adenopathy. Does not bruise/bleed easily.  Psychiatric/Behavioral: Negative for behavioral problems and dysphoric mood. The patient is not nervous/anxious.          Objective:   Physical Exam  Constitutional: He appears well-developed and well-nourished.  Blood pressure 140/70  HENT:  Head: Normocephalic and atraumatic.  Right Ear: External ear normal.  Left Ear: External ear normal.  Nose: Nose normal.  Mouth/Throat: Oropharynx is clear and moist.  Eyes: Pupils are equal, round, and reactive to light. Conjunctivae and EOM are normal. No scleral icterus.  Neck: Normal range of motion. Neck supple. No JVD present. No thyromegaly present.  Cardiovascular: Regular rhythm and normal heart sounds.  Exam reveals no gallop and no friction rub.   No murmur heard. Pedal pulses faint  Pulmonary/Chest: Effort normal and breath sounds normal. He exhibits no tenderness.  Few crackles left base  Abdominal: Soft. Bowel sounds are normal. He exhibits no distension and no mass. There is no tenderness.  Genitourinary: Prostate normal and penis normal.  Musculoskeletal: Normal range of motion. He exhibits no edema or tenderness.  Surgical scars both shoulders and right knee  Right knee warm to touch with small effusion  Lymphadenopathy:    He has no cervical adenopathy.  Neurological: He is alert. He has normal reflexes. No cranial nerve deficit. Coordination normal.  Skin: Skin is warm and dry. No rash noted.  Psychiatric: He has a normal mood and affect. His behavior is normal.          Assessment & Plan:   Preventive health examination.  Unremarkable clinical exam.  Follow-up urology annually.  Laboratory studies reviewed and unremarkable.  Daily low-dose aspirin recommended  History of BPH.  Follow-up urology Medicare wellness visit Essential hypertension, stable.  Continue present regimen Dyslipidemia.  Continue simvastatin  Continue home blood pressure monitoring Follow-up one year or as needed  [employer]

## 2017-05-14 DIAGNOSIS — Z23 Encounter for immunization: Secondary | ICD-10-CM | POA: Diagnosis not present

## 2017-05-21 ENCOUNTER — Telehealth: Payer: Self-pay | Admitting: Internal Medicine

## 2017-05-21 NOTE — Telephone Encounter (Signed)
Pt need a paper script for celecoxib and would like to pick it up tomorrow 05/22/17.

## 2017-05-22 MED ORDER — CELECOXIB 200 MG PO CAPS: 200.0000 mg | ORAL_CAPSULE | Freq: Every day | ORAL | 2 refills | Status: DC

## 2017-05-22 NOTE — Telephone Encounter (Signed)
Pt notified Rx ready for pickup. Rx printed and signed.  

## 2017-05-22 NOTE — Telephone Encounter (Signed)
Rx printed, awaiting to be signed  

## 2017-06-05 ENCOUNTER — Encounter: Payer: Self-pay | Admitting: Internal Medicine

## 2017-06-11 DIAGNOSIS — H524 Presbyopia: Secondary | ICD-10-CM | POA: Diagnosis not present

## 2017-06-11 DIAGNOSIS — H35033 Hypertensive retinopathy, bilateral: Secondary | ICD-10-CM | POA: Diagnosis not present

## 2017-06-30 ENCOUNTER — Encounter (HOSPITAL_BASED_OUTPATIENT_CLINIC_OR_DEPARTMENT_OTHER): Payer: Self-pay | Admitting: *Deleted

## 2017-06-30 ENCOUNTER — Emergency Department (HOSPITAL_BASED_OUTPATIENT_CLINIC_OR_DEPARTMENT_OTHER)
Admission: EM | Admit: 2017-06-30 | Discharge: 2017-06-30 | Disposition: A | Discharge status: Home / Self Care | Payer: Medicare Other | Attending: Emergency Medicine | Admitting: Emergency Medicine

## 2017-06-30 DIAGNOSIS — Y92009 Unspecified place in unspecified non-institutional (private) residence as the place of occurrence of the external cause: Secondary | ICD-10-CM | POA: Diagnosis not present

## 2017-06-30 DIAGNOSIS — I10 Essential (primary) hypertension: Secondary | ICD-10-CM | POA: Insufficient documentation

## 2017-06-30 DIAGNOSIS — T23329A Burn of third degree of unspecified single finger (nail) except thumb, initial encounter: Secondary | ICD-10-CM

## 2017-06-30 DIAGNOSIS — X18XXXA Contact with other hot metals, initial encounter: Secondary | ICD-10-CM | POA: Insufficient documentation

## 2017-06-30 DIAGNOSIS — Z7982 Long term (current) use of aspirin: Secondary | ICD-10-CM | POA: Insufficient documentation

## 2017-06-30 DIAGNOSIS — T23339A Burn of third degree of unspecified multiple fingers (nail), not including thumb, initial encounter: Secondary | ICD-10-CM | POA: Diagnosis not present

## 2017-06-30 DIAGNOSIS — S6992XA Unspecified injury of left wrist, hand and finger(s), initial encounter: Secondary | ICD-10-CM | POA: Diagnosis present

## 2017-06-30 DIAGNOSIS — T310 Burns involving less than 10% of body surface: Secondary | ICD-10-CM | POA: Diagnosis not present

## 2017-06-30 DIAGNOSIS — Y9389 Activity, other specified: Secondary | ICD-10-CM | POA: Diagnosis not present

## 2017-06-30 DIAGNOSIS — Y999 Unspecified external cause status: Secondary | ICD-10-CM | POA: Insufficient documentation

## 2017-06-30 DIAGNOSIS — Z79899 Other long term (current) drug therapy: Secondary | ICD-10-CM | POA: Diagnosis not present
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
## 2017-06-30 NOTE — Discharge Instructions (Signed)
Return to the ER immediately if you start getting changing color of that finger or decreased sensation. Rash with soap and water daily and apply Neosporin twice a day
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
## 2017-07-04 ENCOUNTER — Other Ambulatory Visit: Payer: Self-pay | Admitting: Internal Medicine

## 2017-07-11 DIAGNOSIS — R972 Elevated prostate specific antigen [PSA]: Secondary | ICD-10-CM | POA: Diagnosis not present

## 2017-07-18 DIAGNOSIS — R972 Elevated prostate specific antigen [PSA]: Secondary | ICD-10-CM | POA: Diagnosis not present

## 2017-07-18 DIAGNOSIS — N401 Enlarged prostate with lower urinary tract symptoms: Secondary | ICD-10-CM | POA: Diagnosis not present

## 2017-10-23 ENCOUNTER — Other Ambulatory Visit: Payer: Self-pay | Admitting: Internal Medicine

## 2017-11-02 IMAGING — DX DG CHEST 2V
2 series · 2 of 2 positions shown · non-contrast
Comparison: 09/02/2013

CLINICAL DATA: Cough, congestion, and sore throat since 09/20/2016,
wheezing, history hypertension, hyperlipidemia

EXAM:
CHEST  2 VIEW

[chest pa]
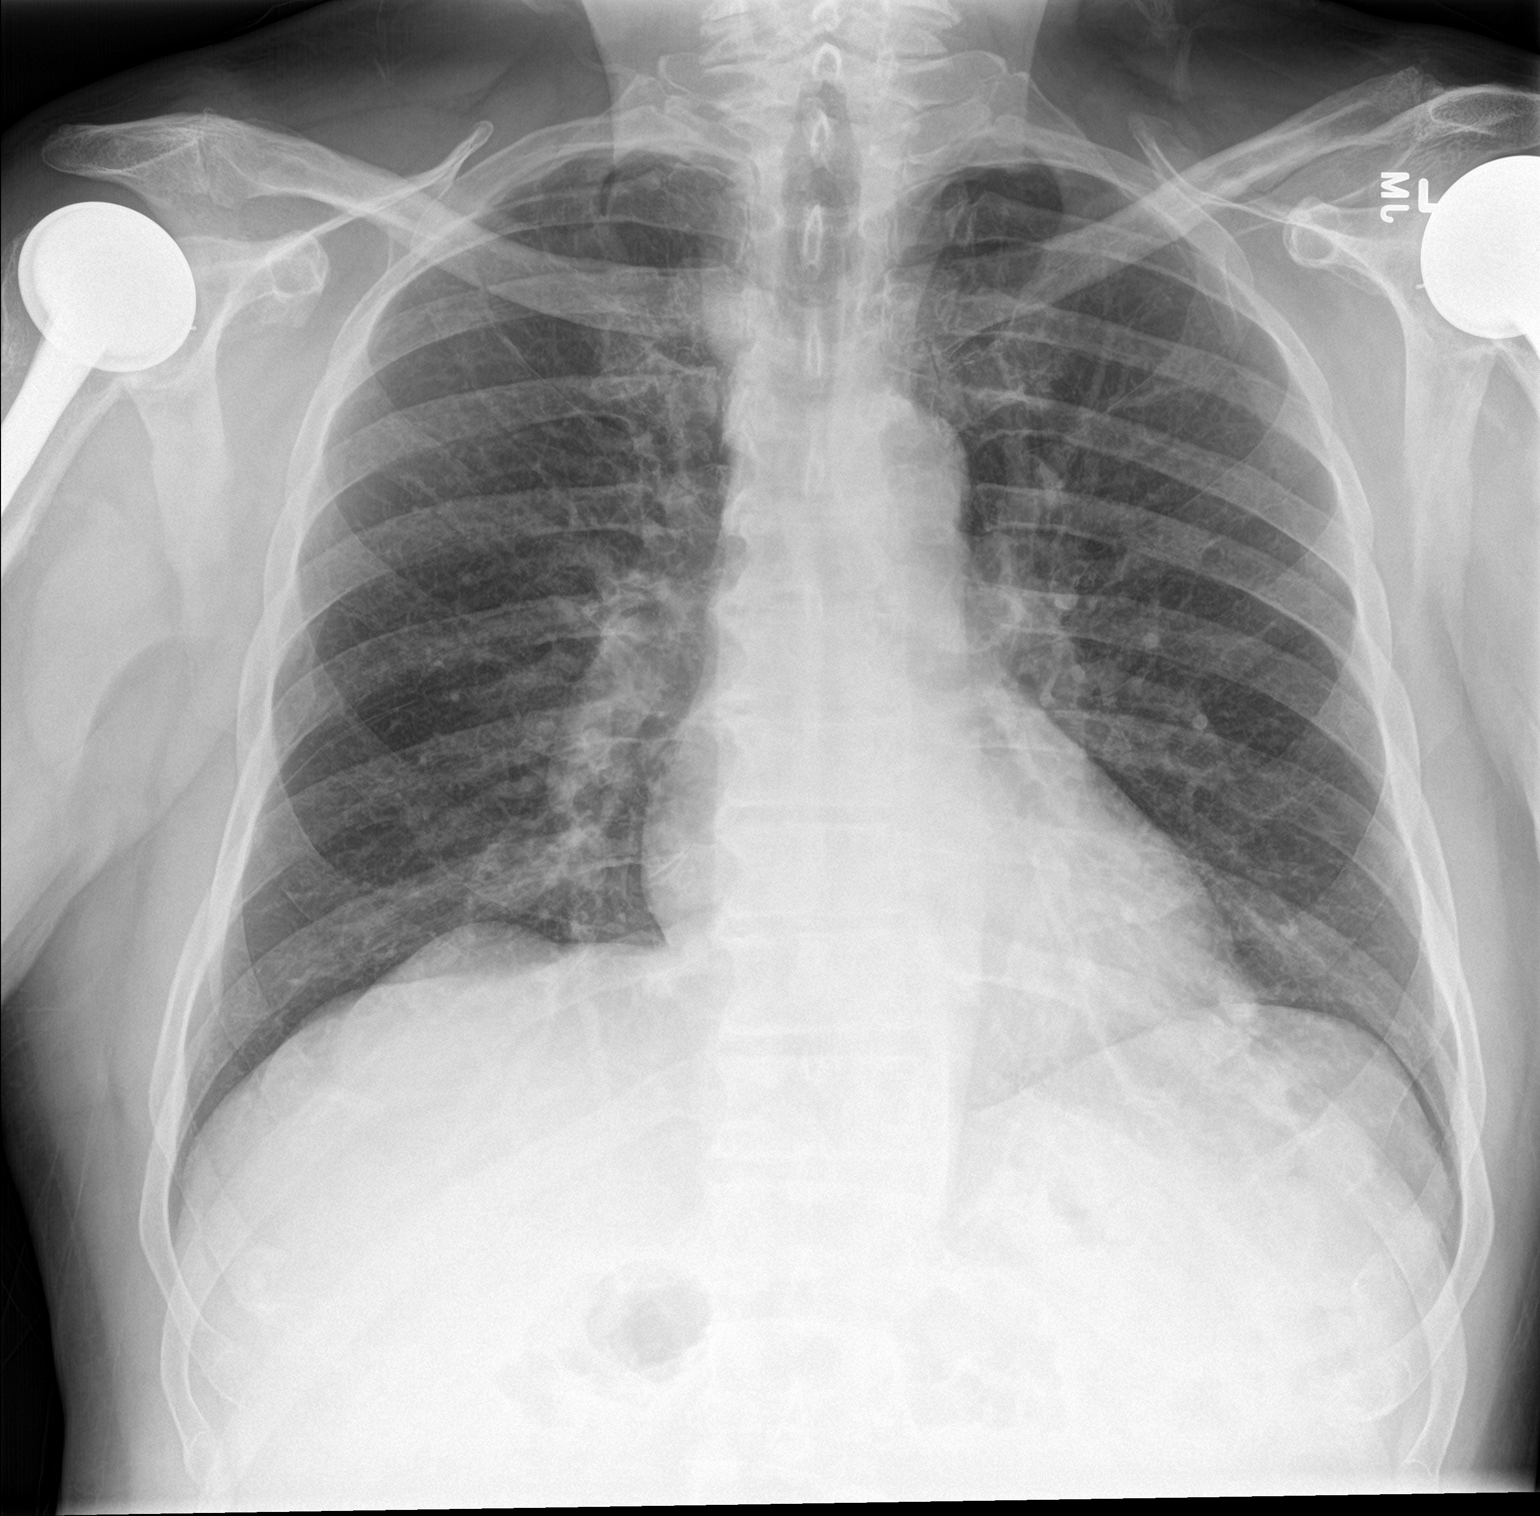

[chest lat]
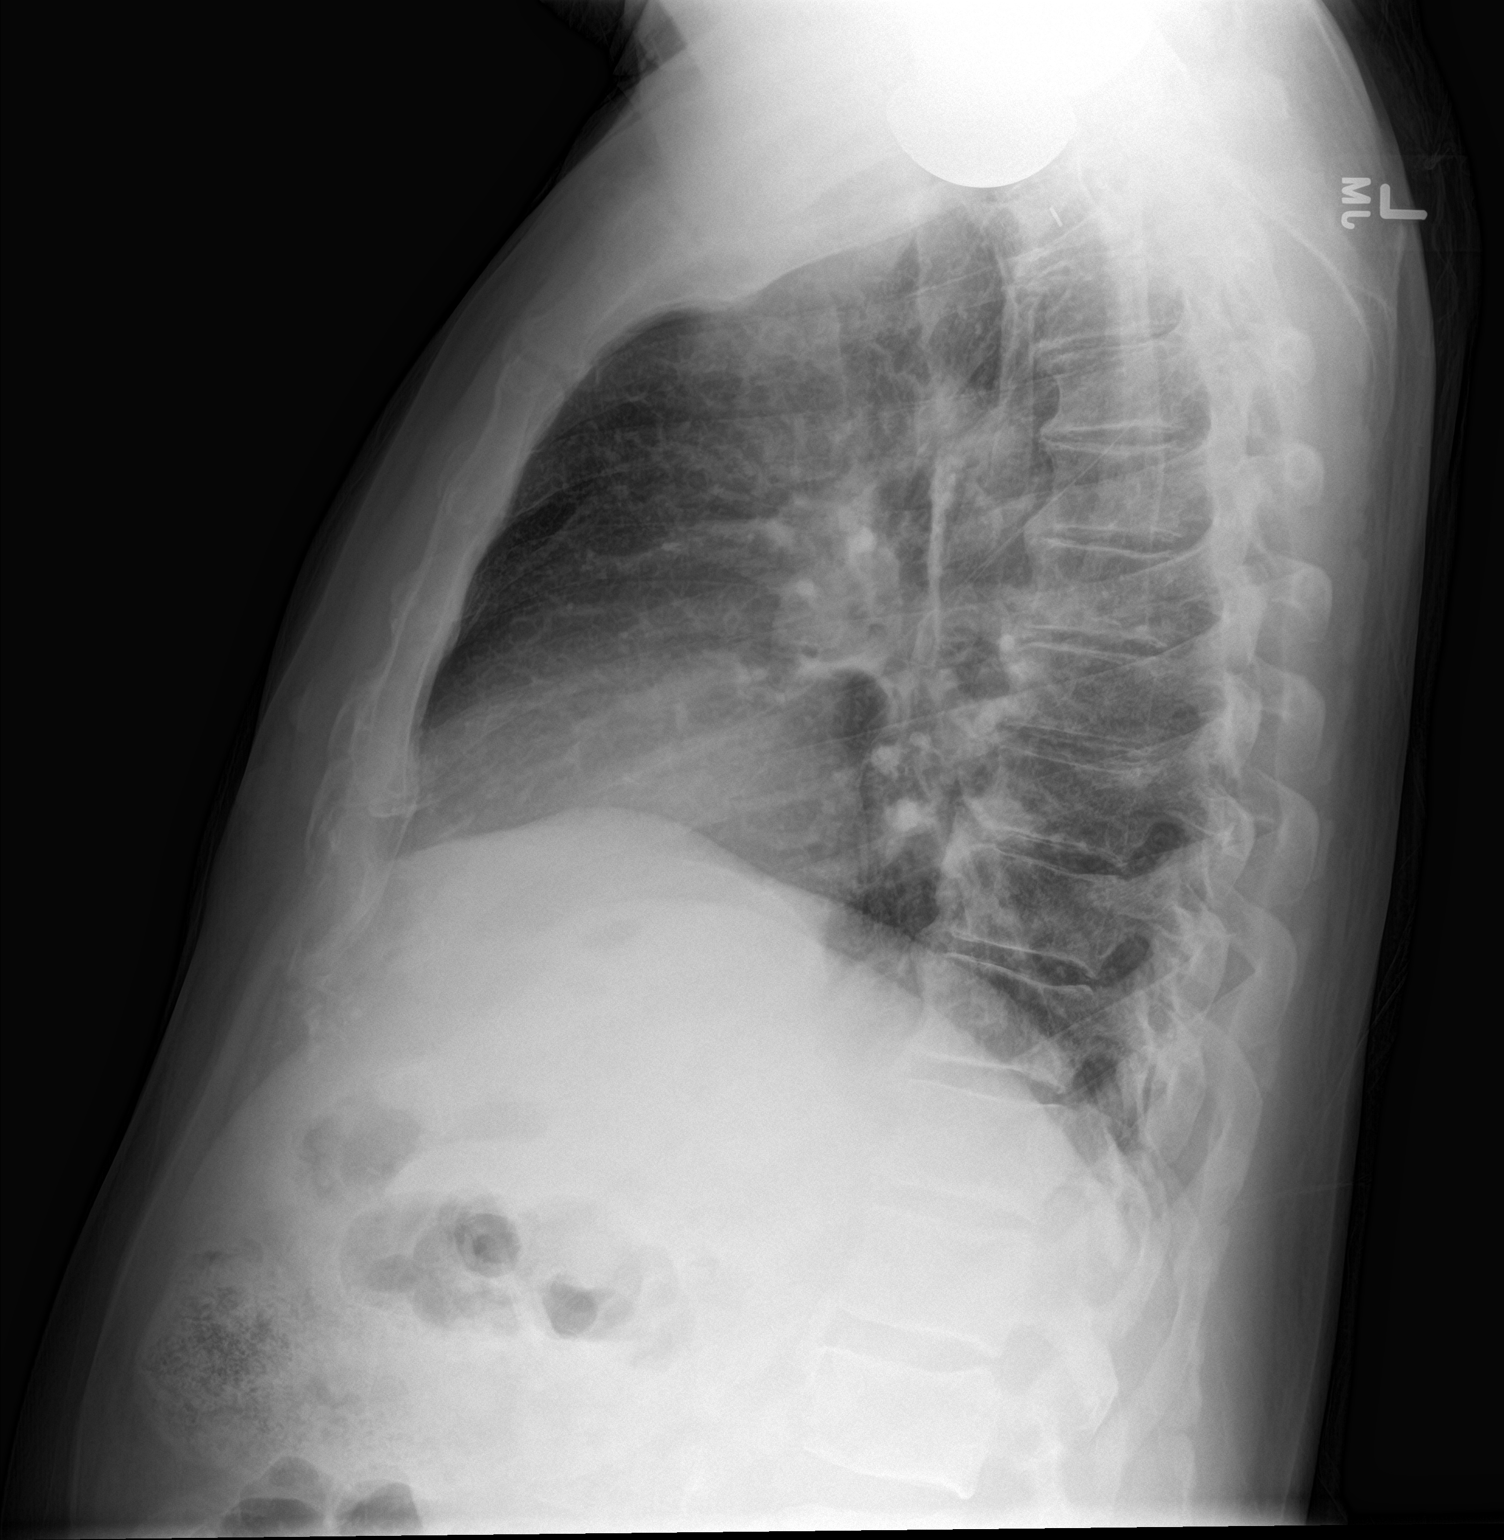

[2 of 2 positions shown; findings below may reference images not displayed]

FINDINGS: Normal heart size, mediastinal contours, and pulmonary vascularity.

Minimal chronic bronchitic changes.

Lungs otherwise clear.

No pleural effusion or pneumothorax.

Bones appear demineralized with BILATERAL shoulder prostheses noted.

Mild scattered endplate spur formation thoracic spine.
IMPRESSION: Minimal chronic bronchitic changes.

No acute abnormalities.

## 2017-11-15 DIAGNOSIS — M545 Low back pain: Secondary | ICD-10-CM | POA: Diagnosis not present

## 2017-11-15 DIAGNOSIS — M79605 Pain in left leg: Secondary | ICD-10-CM | POA: Diagnosis not present

## 2017-11-24 ENCOUNTER — Ambulatory Visit: Payer: Self-pay | Admitting: *Deleted

## 2017-11-24 NOTE — Telephone Encounter (Signed)
Would recommend holding Tamiflu at this time, but if patient becomes ill with fever, please call back for prescription to treat

## 2017-11-24 NOTE — Telephone Encounter (Signed)
Pt   Has   Been taking  Care  Of  A  Friend  Who  Became  Sick  2  Days   Ago  And  Was  Was  Diagnosed   With   The  Flu  And was  Rx  Tamiflu    His  Friends  Housekeeper   Has  The  Flu  As   Well   Pt  Reports  yest  He  Had  A  Runny  Nose  And  Mild  Body  Aches  - took   Some  Tylenol   Last  Night  And  Today  He  Feels  Fine    Pt  Is  Inquiring   If he   Needs   Tamiflu .       Patients  Number is   [place]  [place]   [place]

## 2017-11-25 NOTE — Telephone Encounter (Signed)
Left message for patient to return phone call.  

## 2017-11-27 NOTE — Telephone Encounter (Signed)
Called patient and left message to return call

## 2017-12-03 ENCOUNTER — Other Ambulatory Visit: Payer: Self-pay | Admitting: Internal Medicine

## 2017-12-03 ENCOUNTER — Other Ambulatory Visit: Payer: Self-pay | Admitting: *Deleted

## 2017-12-03 MED ORDER — SIMVASTATIN 40 MG PO TABS: 40.0000 mg | ORAL_TABLET | Freq: Every day | ORAL | 1 refills | Status: DC

## 2017-12-03 MED ORDER — POTASSIUM CITRATE ER 10 MEQ (1080 MG) PO TBCR: EXTENDED_RELEASE_TABLET | ORAL | 3 refills | Status: DC

## 2017-12-03 MED ORDER — BENAZEPRIL-HYDROCHLOROTHIAZIDE 20-12.5 MG PO TABS: 1.0000 | ORAL_TABLET | Freq: Every morning | ORAL | 1 refills | Status: DC

## 2017-12-03 NOTE — Telephone Encounter (Signed)
BP and Cholesterol medications refilled per protocol and notes at last visit. ( Annual exam 04/07/17)  Flonase and Gabapentin for refill review  LOV: 04/07/17   PCP: [place]: verified

## 2017-12-03 NOTE — Telephone Encounter (Signed)
Copied from [place] [id number]. Topic: Quick Communication - Rx Refill/Question >> Dec 03, 2017 12:37 PM Oliver Pila B wrote: Medication:  benazepril-hydrochlorthiazide (LOTENSIN HCT) 20-12.5 MG tablet [[id number]] , fluticasone (FLONASE) 50 MCG/ACT nasal spray [[id number]] , gabapentin (NEURONTIN) 600 MG tablet [[id number]] ,  potassium citrate (UROCIT-K) 10 MEQ (1080 MG) SR tablet [[id number]] , simvastatin (ZOCOR) 40 MG tablet [[id number]]   90 DAY SUPPLY FOR ALL   Has the patient contacted their pharmacy? Yes.     (Agent: If no, request that the patient contact the pharmacy for the refill.)   Preferred Pharmacy (with phone number or street name): [facility]   Agent: Please be advised that RX refills may take up to 3 business days. We ask that you follow-up with your pharmacy.

## 2017-12-04 MED ORDER — FLUTICASONE PROPIONATE 50 MCG/ACT NA SUSP: 1.0000 | Freq: Every day | NASAL | 3 refills | Status: DC

## 2017-12-04 MED ORDER — GABAPENTIN 600 MG PO TABS: 600.0000 mg | ORAL_TABLET | Freq: Three times a day (TID) | ORAL | 1 refills | Status: DC

## 2018-01-16 DIAGNOSIS — R972 Elevated prostate specific antigen [PSA]: Secondary | ICD-10-CM | POA: Diagnosis not present

## 2018-03-05 DIAGNOSIS — R972 Elevated prostate specific antigen [PSA]: Secondary | ICD-10-CM | POA: Diagnosis not present

## 2018-03-10 DIAGNOSIS — R310 Gross hematuria: Secondary | ICD-10-CM | POA: Diagnosis not present

## 2018-03-10 DIAGNOSIS — R972 Elevated prostate specific antigen [PSA]: Secondary | ICD-10-CM | POA: Diagnosis not present

## 2018-03-11 ENCOUNTER — Encounter: Payer: Self-pay | Admitting: Internal Medicine

## 2018-03-11 DIAGNOSIS — D485 Neoplasm of uncertain behavior of skin: Secondary | ICD-10-CM | POA: Diagnosis not present

## 2018-03-11 DIAGNOSIS — D225 Melanocytic nevi of trunk: Secondary | ICD-10-CM | POA: Diagnosis not present

## 2018-03-11 DIAGNOSIS — L821 Other seborrheic keratosis: Secondary | ICD-10-CM | POA: Diagnosis not present

## 2018-03-11 DIAGNOSIS — L859 Epidermal thickening, unspecified: Secondary | ICD-10-CM | POA: Diagnosis not present

## 2018-03-11 DIAGNOSIS — Z85828 Personal history of other malignant neoplasm of skin: Secondary | ICD-10-CM | POA: Diagnosis not present

## 2018-03-11 DIAGNOSIS — L570 Actinic keratosis: Secondary | ICD-10-CM | POA: Diagnosis not present

## 2018-03-11 DIAGNOSIS — K130 Diseases of lips: Secondary | ICD-10-CM | POA: Diagnosis not present

## 2018-03-11 DIAGNOSIS — D224 Melanocytic nevi of scalp and neck: Secondary | ICD-10-CM | POA: Diagnosis not present

## 2018-03-11 DIAGNOSIS — L814 Other melanin hyperpigmentation: Secondary | ICD-10-CM | POA: Diagnosis not present

## 2018-03-20 DIAGNOSIS — N200 Calculus of kidney: Secondary | ICD-10-CM | POA: Diagnosis not present

## 2018-03-20 DIAGNOSIS — R310 Gross hematuria: Secondary | ICD-10-CM | POA: Diagnosis not present

## 2018-03-24 DIAGNOSIS — N200 Calculus of kidney: Secondary | ICD-10-CM | POA: Diagnosis not present

## 2018-03-24 DIAGNOSIS — R310 Gross hematuria: Secondary | ICD-10-CM | POA: Diagnosis not present

## 2018-03-24 DIAGNOSIS — R972 Elevated prostate specific antigen [PSA]: Secondary | ICD-10-CM | POA: Diagnosis not present

## 2018-03-24 DIAGNOSIS — N210 Calculus in bladder: Secondary | ICD-10-CM | POA: Diagnosis not present

## 2018-03-26 DIAGNOSIS — R310 Gross hematuria: Secondary | ICD-10-CM | POA: Diagnosis not present

## 2018-03-26 DIAGNOSIS — R8271 Bacteriuria: Secondary | ICD-10-CM | POA: Diagnosis not present

## 2018-03-31 DIAGNOSIS — Z96651 Presence of right artificial knee joint: Secondary | ICD-10-CM | POA: Diagnosis not present

## 2018-03-31 DIAGNOSIS — M722 Plantar fascial fibromatosis: Secondary | ICD-10-CM | POA: Diagnosis not present

## 2018-03-31 DIAGNOSIS — Z471 Aftercare following joint replacement surgery: Secondary | ICD-10-CM | POA: Diagnosis not present

## 2018-04-09 ENCOUNTER — Ambulatory Visit (INDEPENDENT_AMBULATORY_CARE_PROVIDER_SITE_OTHER): Payer: PPO | Admitting: Internal Medicine

## 2018-04-09 ENCOUNTER — Encounter: Payer: Self-pay | Admitting: Internal Medicine

## 2018-04-09 VITALS — BP 126/70 | HR 66 | Temp 97.8°F | Ht 69.0 in | Wt 181.8 lb

## 2018-04-09 DIAGNOSIS — R7302 Impaired glucose tolerance (oral): Secondary | ICD-10-CM | POA: Diagnosis not present

## 2018-04-09 DIAGNOSIS — I10 Essential (primary) hypertension: Secondary | ICD-10-CM

## 2018-04-09 DIAGNOSIS — E785 Hyperlipidemia, unspecified: Secondary | ICD-10-CM

## 2018-04-09 DIAGNOSIS — M150 Primary generalized (osteo)arthritis: Secondary | ICD-10-CM | POA: Diagnosis not present

## 2018-04-09 DIAGNOSIS — Z0000 Encounter for general adult medical examination without abnormal findings: Secondary | ICD-10-CM | POA: Diagnosis not present

## 2018-04-09 DIAGNOSIS — M159 Polyosteoarthritis, unspecified: Secondary | ICD-10-CM

## 2018-04-09 LAB — COMPREHENSIVE METABOLIC PANEL
ALT: 16 U/L (ref 0–53)
AST: 14 U/L (ref 0–37)
Albumin: 4.3 g/dL (ref 3.5–5.2)
Alkaline Phosphatase: 53 U/L (ref 39–117)
BUN: 32 mg/dL — ABNORMAL HIGH (ref 6–23)
CALCIUM: 9.2 mg/dL (ref 8.4–10.5)
CHLORIDE: 105 meq/L (ref 96–112)
CO2: 27 meq/L (ref 19–32)
Creatinine, Ser: 0.75 mg/dL (ref 0.40–1.50)
GFR: 106.62 mL/min (ref >60.00)
GLUCOSE: 104 mg/dL — AB (ref 70–99)
Potassium: 4.6 mEq/L (ref 3.5–5.1)
Sodium: 139 mEq/L (ref 135–145)
Total Bilirubin: 0.7 mg/dL (ref 0.2–1.2)
Total Protein: 6.5 g/dL (ref 6.0–8.3)

## 2018-04-09 LAB — LIPID PANEL
CHOL/HDL RATIO: 2
Cholesterol: 137 mg/dL (ref 0–200)
HDL: 54.9 mg/dL (ref >39.00)
LDL CALC: 71 mg/dL (ref 0–99)
NONHDL: 81.99
TRIGLYCERIDES: 54 mg/dL (ref 0.0–149.0)
VLDL: 10.8 mg/dL (ref 0.0–40.0)

## 2018-04-09 LAB — TSH: TSH: 4.02 u[IU]/mL (ref 0.35–4.50)

## 2018-04-09 LAB — CBC WITH DIFFERENTIAL/PLATELET
BASOS PCT: 1.2 % (ref 0.0–3.0)
Basophils Absolute: 0.1 10*3/uL (ref 0.0–0.1)
EOS ABS: 0.3 10*3/uL (ref 0.0–0.7)
Eosinophils Relative: 4.4 % (ref 0.0–5.0)
HCT: 37.7 % — ABNORMAL LOW (ref 39.0–52.0)
HEMOGLOBIN: 13 g/dL (ref 13.0–17.0)
LYMPHS PCT: 16.3 % (ref 12.0–46.0)
Lymphs Abs: 1 10*3/uL (ref 0.7–4.0)
MCHC: 34.3 g/dL (ref 30.0–36.0)
MCV: 90.3 fl (ref 78.0–100.0)
MONO ABS: 0.5 10*3/uL (ref 0.1–1.0)
MONOS PCT: 7.6 % (ref 3.0–12.0)
NEUTROS ABS: 4.2 10*3/uL (ref 1.4–7.7)
NEUTROS PCT: 70.5 % (ref 43.0–77.0)
Platelets: 210 10*3/uL (ref 150.0–400.0)
RBC: 4.17 Mil/uL — ABNORMAL LOW (ref 4.22–5.81)
RDW: 13.5 % (ref 11.5–15.5)
WBC: 6 10*3/uL (ref 4.0–10.5)

## 2018-04-09 MED ORDER — POTASSIUM CITRATE ER 10 MEQ (1080 MG) PO TBCR: EXTENDED_RELEASE_TABLET | ORAL | 3 refills | Status: DC

## 2018-04-09 MED ORDER — BENAZEPRIL-HYDROCHLOROTHIAZIDE 20-12.5 MG PO TABS: 1.0000 | ORAL_TABLET | Freq: Every morning | ORAL | 4 refills | Status: DC

## 2018-04-09 MED ORDER — CELECOXIB 200 MG PO CAPS: 200.0000 mg | ORAL_CAPSULE | Freq: Every day | ORAL | 2 refills | Status: DC

## 2018-04-09 MED ORDER — ALBUTEROL SULFATE HFA 108 (90 BASE) MCG/ACT IN AERS: 1.0000 | INHALATION_SPRAY | Freq: Four times a day (QID) | RESPIRATORY_TRACT | 4 refills | Status: DC | PRN

## 2018-04-09 MED ORDER — BENAZEPRIL-HYDROCHLOROTHIAZIDE 20-12.5 MG PO TABS: 1.0000 | ORAL_TABLET | Freq: Every morning | ORAL | 1 refills | Status: DC

## 2018-04-09 MED ORDER — FLUTICASONE PROPIONATE 50 MCG/ACT NA SUSP: 1.0000 | Freq: Every day | NASAL | 3 refills | Status: DC

## 2018-04-09 MED ORDER — GABAPENTIN 600 MG PO TABS: 600.0000 mg | ORAL_TABLET | Freq: Three times a day (TID) | ORAL | 4 refills | Status: DC

## 2018-04-09 MED ORDER — SIMVASTATIN 40 MG PO TABS: 40.0000 mg | ORAL_TABLET | Freq: Every day | ORAL | 4 refills | Status: DC

## 2018-04-09 NOTE — Patient Instructions (Signed)
Limit your sodium (Salt) intake  Please check your blood pressure on a regular basis.  If it is consistently greater than 140/90, please make an office appointment.  Return in 6 months for follow-up    It is important that you exercise regularly, at least 20 minutes 3 to 4 times per week.  If you develop chest pain or shortness of breath seek  medical attention.

## 2018-04-09 NOTE — Progress Notes (Signed)
Subjective:    Patient ID: [name] [name], male    DOB: [birth year], 82 y.o.   MRN: [id number]  HPI  82 year old patient seen today for annual follow-up as well as a subsequent Medicare wellness visit He has a history of essential hypertension which has been well controlled.  He has dyslipidemia and osteoarthritis. The patient has had a recent urological evaluation due to gross hematuria secondary to a cystitis. Today he is doing well. Denies any cardiopulmonary complaints  His only complaint is decreased hearing from the left ear which has been chronic  He has had a recent eye examination  Allergies (verified) :  No Known Drug Allergies  Past History:  Past Medical History:   Hyperlipidemia  Nephrolithiasis, hx of  Osteoarthritis  Benign prostatic hypertrophy  Allergic rhinitis  systolic hypertension  external hemorrhoids  Hypertension  Past Surgical History:   Appendectomy  Inguinal herniorrhaphy  Lithotripsy  colonoscopy 2004  ETT 1-06  Cardiolye 6-09  TURP in July 2013  Family History:   father died at 29, cerebrovascular disease  mother died at age 44, MI  Three sisters two brothers, positive for diabetes; one sister with COPD who is deceased; one brother died of suicide death   Social History:   Married  Quite active on a horse farm   Past Medical History:  Diagnosis Date  . ALLERGIC RHINITIS 04/23/2007  . BENIGN PROSTATIC HYPERTROPHY 04/23/2007   resolved after TURP  . Cellulitis june 2013   left side("left flank")  . CHEST PAIN 02/04/2008  . H/O blurred vision    both eyes, occasional  . Headache   . HYPERLIPIDEMIA 04/23/2007  . HYPERTENSION 06/02/2008  . Kidney stone   . NAUSEA 12/29/2008   ? med related, no current problems  . NEPHROLITHIASIS, HX OF 04/23/2007   pt denies this   . Neuropathy of left foot   . OSTEOARTHRITIS 04/23/2007  . Palpitations 02/04/2008  . Pneumonia   . PONV (postoperative nausea and vomiting)   . RECTAL  BLEEDING 02/04/2008   no recent problems  . 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 URI 09/01/2008     Social History   Socioeconomic History  . Marital status: Married    Spouse name: Not on file  . Number of children: Not on file  . Years of education: Not on file  . Highest education level: Not on file  Occupational History  . Not on file  Social Needs  . Financial resource strain: Not on file  . Food insecurity:    Worry: Not on file    Inability: Not on file  . Transportation needs:    Medical: Not on file    Non-medical: Not on file  Tobacco Use  . Smoking status: Never Smoker  . Smokeless tobacco: Never Used  Substance and Sexual Activity  . Alcohol use: No  . Drug use: No  . Sexual activity: Yes  Lifestyle  . Physical activity:    Days per week: Not on file    Minutes per session: Not on file  . Stress: Not on file  Relationships  . Social connections:    Talks on phone: Not on file    Gets together: Not on file    Attends religious service: Not on file    Active member of club or organization: Not on file    Attends meetings of clubs or organizations: Not on file    Relationship status: Not on file  .  Intimate partner violence:    Fear of current or ex partner: Not on file    Emotionally abused: Not on file    Physically abused: Not on file    Forced sexual activity: Not on file  Other Topics Concern  . Not on file  Social History Narrative  . Not on file    Past Surgical History:  Procedure Laterality Date  . APPENDECTOMY    . COLONOSCOPY    . HERNIA REPAIR    . left foot surgery for fx  Jul 30, 2011  . LITHOTRIPSY  1992   cystoscopy with stent placement also done(1 stone fragment remains)  . right shoulder replacement  Sep 28, 2009  . TOTAL KNEE ARTHROPLASTY Right 08/30/2013   Procedure: RIGHT TOTAL KNEE ARTHROPLASTY RIGHT ;  Surgeon: Gearlean Alf, MD;  Location: [facility];  Service: Orthopedics;  Laterality: Right;  . TOTAL  SHOULDER ARTHROPLASTY Left 02/02/2015   Procedure: LEFT TOTAL SHOULDER ARTHROPLASTY;  Surgeon: Justice Britain, MD;  Location: [facility];  Service: Orthopedics;  Laterality: Left;  . TRANSURETHRAL RESECTION OF PROSTATE  03/27/2012   Procedure: TRANSURETHRAL RESECTION OF THE PROSTATE WITH GYRUS INSTRUMENTS;  Surgeon: Claybon Jabs, MD;  Location: [facility];  Service: Urology;;    Family History  Problem Relation Age of Onset  . Heart disease Mother 3       MI  . Stroke Father 18  . COPD Sister   . Diabetes Sister   . Diabetes Brother     No Known Allergies  Current Outpatient Medications on File Prior to Visit  Medication Sig Dispense Refill  . aspirin 81 MG tablet Take 81 mg by mouth daily.    . B Complex Vitamins (VITAMIN-B COMPLEX PO) Take by mouth daily.    . Cholecalciferol (VITAMIN D3) 5000 UNITS CAPS Take 1,000 Units by mouth daily.    . Coenzyme Q10 (CO Q 10 PO) Take 1 tablet by mouth daily.     . Glucosamine-Chondroit-Vit C-Mn (GLUCOSAMINE CHONDR 1500 COMPLX) CAPS Take 1 capsule by mouth daily.    . hydroxypropyl methylcellulose / hypromellose (ISOPTO TEARS / GONIOVISC) 2.5 % ophthalmic solution Place 1 drop into both eyes as needed for dry eyes.    Marland Kitchen loratadine (CLARITIN) 10 MG tablet Take 10 mg by mouth every evening.     . Multiple Vitamin (MULTIVITAMIN) tablet Take 1 tablet by mouth daily.    . psyllium (HYDROCIL/METAMUCIL) 95 % PACK Take 2 packets by mouth daily.      No current facility-administered medications on file prior to visit.     BP 126/70 (BP Location: Right Arm, Patient Position: Sitting, Cuff Size: Normal)   Pulse 66   Temp 97.8 F (36.6 C) (Oral)   Ht 5\' 9"  (1.753 m)   Wt 181 lb 12.8 oz (82.5 kg)   SpO2 99%   BMI 26.85 kg/m   Subsequent Medicare wellness visit  1. Risk factors, based on past  M,S,F history.  Cardiovascular risk factors include a history of hypertension and dyslipidemia.  He does have a family history of cardiac disease  2.  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  He does have some significant arthritis affecting the knees which does limit his activity somewhat  3.  Depression/mood: No history of major depression or mood disorder  4.  Hearing: Chronic left-sided nerve deafness  5.  ADL's: Independent  6.  Fall risk: Low  7.  Home  safety: No problems identified   8.  Height weight, and visual acuity; height and weight stable no change in visual acuity has had a recent eye examination  9.  Counseling: Continue heart healthy diet and active lifestyle  10. Lab orders based on risk factors: Laboratory update will be reviewed  11. Referral : None appropriate at this time.  Patient has had recent ophthalmology and urology follow-up  12. Care plan: Continue efforts at aggressive risk factor modification  13. Cognitive assessment: Alert and appropriate normal affect.  No cognitive dysfunction  14. Screening: Patient provided with a written and personalized 5-10 year screening schedule in the AVS.    15. Provider List Update: Primary care orthopedics ophthalmology as well as urology    Review of Systems  Constitutional: Negative for appetite change, chills, fatigue and fever.  HENT: Positive for hearing loss. Negative for congestion, dental problem, ear pain, sore throat, tinnitus, trouble swallowing and voice change.   Eyes: Negative for pain, discharge and visual disturbance.  Respiratory: Negative for cough, chest tightness, wheezing and stridor.   Cardiovascular: Negative for chest pain, palpitations and leg swelling.  Gastrointestinal: Negative for abdominal distention, abdominal pain, blood in stool, constipation, diarrhea, nausea and vomiting.  Genitourinary: Negative for difficulty urinating, discharge, flank pain, genital sores, hematuria and urgency.  Musculoskeletal: Positive for arthralgias. Negative for back pain, gait problem, joint swelling, myalgias  and neck stiffness.  Skin: Negative for rash.  Neurological: Negative for dizziness, syncope, speech difficulty, weakness, numbness and headaches.  Hematological: Negative for adenopathy. Does not bruise/bleed easily.  Psychiatric/Behavioral: Negative for behavioral problems and dysphoric mood. The patient is not nervous/anxious.        Objective:   Physical Exam  Constitutional: He appears well-developed and well-nourished.  HENT:  Head: Normocephalic and atraumatic.  Right Ear: External ear normal.  Left Ear: External ear normal.  Nose: Nose normal.  Mouth/Throat: Oropharynx is clear and moist.  Partial deafness left ear  Eyes: Pupils are equal, round, and reactive to light. Conjunctivae and EOM are normal. No scleral icterus.  Neck: Normal range of motion. Neck supple. No JVD present. No thyromegaly present.  Cardiovascular: Regular rhythm, normal heart sounds and intact distal pulses. Exam reveals no gallop and no friction rub.  No murmur heard. Pulmonary/Chest: Effort normal and breath sounds normal. He exhibits no tenderness.  Abdominal: Soft. Bowel sounds are normal. He exhibits no distension and no mass. There is no tenderness.  Genitourinary: Penis normal.  Musculoskeletal: Normal range of motion. He exhibits no edema or tenderness.  Lymphadenopathy:    He has no cervical adenopathy.  Neurological: He is alert. He has normal reflexes. No cranial nerve deficit. Coordination normal.  Skin: Skin is warm and dry. No rash noted.  Surgical scar right knee bilateral shoulders and right lower quadrant  Psychiatric: He has a normal mood and affect. His behavior is normal.          Assessment & Plan:   Essential hypertension.  Blood pressure well controlled Dyslipidemia continue simvastatin.  Will review a lipid profile Osteoarthritis Subsequent Medicare wellness visit review updated lab   Medications refilled Patient will follow-up with a new provider in approximately 6  months  Marletta Lor

## 2018-04-15 ENCOUNTER — Encounter: Payer: Self-pay | Admitting: Internal Medicine

## 2018-06-15 DIAGNOSIS — H5203 Hypermetropia, bilateral: Secondary | ICD-10-CM | POA: Diagnosis not present

## 2018-06-15 DIAGNOSIS — H35033 Hypertensive retinopathy, bilateral: Secondary | ICD-10-CM | POA: Diagnosis not present

## 2018-06-28 DIAGNOSIS — Z23 Encounter for immunization: Secondary | ICD-10-CM | POA: Diagnosis not present

## 2018-08-24 ENCOUNTER — Other Ambulatory Visit: Payer: Self-pay | Admitting: Internal Medicine

## 2018-09-22 DIAGNOSIS — R972 Elevated prostate specific antigen [PSA]: Secondary | ICD-10-CM | POA: Diagnosis not present

## 2018-12-02 ENCOUNTER — Telehealth: Payer: Self-pay | Admitting: Internal Medicine

## 2018-12-02 DIAGNOSIS — I10 Essential (primary) hypertension: Secondary | ICD-10-CM

## 2018-12-02 NOTE — Telephone Encounter (Signed)
Copied from [place] [id number]. Topic: General - Other >> Dec 02, 2018  2:52 PM Lennox Solders wrote: Reason for CRM: pt has an appt with dr Raoul Pitch on 01-19-2019 and needs an refill on benazepril-hctz . [facility]. Pt last seen dr Burnice Logan  in April 09, 2018

## 2018-12-02 NOTE — Telephone Encounter (Signed)
We have not seen this patient yet to be able to refill medication. Dr Kennieth Rad needs to fill or patient needs to est with [place] sooner if he no longer has medication refills.

## 2018-12-03 MED ORDER — BENAZEPRIL-HYDROCHLOROTHIAZIDE 20-12.5 MG PO TABS: 1.0000 | ORAL_TABLET | Freq: Every morning | ORAL | 0 refills | Status: DC

## 2018-12-03 NOTE — Addendum Note (Signed)
Addended by: Westley Hummer B on: 12/03/2018 01:54 PM   Modules accepted: Orders

## 2018-12-03 NOTE — Telephone Encounter (Signed)
Refill sent.

## 2018-12-03 NOTE — Telephone Encounter (Signed)
Any ideas? 

## 2019-01-19 ENCOUNTER — Encounter: Payer: Self-pay | Admitting: Family Medicine

## 2019-01-19 ENCOUNTER — Ambulatory Visit (INDEPENDENT_AMBULATORY_CARE_PROVIDER_SITE_OTHER): Payer: PPO | Admitting: Family Medicine

## 2019-01-19 ENCOUNTER — Other Ambulatory Visit: Payer: Self-pay

## 2019-01-19 VITALS — BP 126/62 | HR 62 | Ht 69.0 in

## 2019-01-19 DIAGNOSIS — E785 Hyperlipidemia, unspecified: Secondary | ICD-10-CM

## 2019-01-19 DIAGNOSIS — I10 Essential (primary) hypertension: Secondary | ICD-10-CM | POA: Diagnosis not present

## 2019-01-19 DIAGNOSIS — N400 Enlarged prostate without lower urinary tract symptoms: Secondary | ICD-10-CM | POA: Diagnosis not present

## 2019-01-19 DIAGNOSIS — M150 Primary generalized (osteo)arthritis: Secondary | ICD-10-CM | POA: Diagnosis not present

## 2019-01-19 DIAGNOSIS — E663 Overweight: Secondary | ICD-10-CM

## 2019-01-19 DIAGNOSIS — G629 Polyneuropathy, unspecified: Secondary | ICD-10-CM | POA: Diagnosis not present

## 2019-01-19 DIAGNOSIS — M159 Polyosteoarthritis, unspecified: Secondary | ICD-10-CM

## 2019-01-19 NOTE — Patient Instructions (Signed)
Please help us help you:  We are honored you have chosen [facility] for your Primary Care home. Below you will find basic instructions that you may need to access in the future. Please help us help you by reading the instructions, which cover many of the frequent questions we experience.   Prescription refills and request:  -In order to allow more efficient response time, please call your pharmacy for all refills. They will forward the request electronically to us. This allows for the quickest possible response. Request left on a nurse line can take longer to refill, since these are checked as time allows between office patients and other phone calls.  - refill request can take up to 3-5 working days to complete.  - If request is sent electronically and request is appropiate, it is usually completed in 1-2 business days.  - all patients will need to be seen routinely for all chronic medical conditions requiring prescription medications (see follow-up below). If you are overdue for follow up on your condition, you will be asked to make an appointment and we will call in enough medication to cover you until your appointment (up to 30 days).  - all controlled substances will require a face to face visit to request/refill.  - if you desire your prescriptions to go through a new pharmacy, and have an active script at original pharmacy, you will need to call your pharmacy and have scripts transferred to new pharmacy. This is completed between the pharmacy locations and not by your provider.    Results: If any images or labs were ordered, it can take up to 1 week to get results depending on the test ordered and the lab/facility running and resulting the test. - Normal or stable results, which do not need further discussion, may be released to your mychart immediately with attached note to you. A call may not be generated for normal results. Please make certain to sign up for mychart. If you have  questions on how to activate your mychart you can call the front office.  - If your results need further discussion, our office will attempt to contact you via phone, and if unable to reach you after 2 attempts, we will release your abnormal result to your mychart with instructions.  - All results will be automatically released in mychart after 1 week.  - Your provider will provide you with explanation and instruction on all relevant material in your results. Please keep in mind, results and labs may appear confusing or abnormal to the untrained eye, but it does not mean they are actually abnormal for you personally. If you have any questions about your results that are not covered, or you desire more detailed explanation than what was provided, you should make an appointment with your provider to do so.   Our office handles many outgoing and incoming calls daily. If we have not contacted you within 1 week about your results, please check your mychart to see if there is a message first and if not, then contact our office.  In helping with this matter, you help decrease call volume, and therefore allow us to be able to respond to patients needs more efficiently.   Acute office visits (sick visit):  An acute visit is intended for a new problem and are scheduled in shorter time slots to allow schedule openings for patients with new problems. This is the appropriate visit to discuss a new problem. Problems will not be addressed by   phone call or Echart message. Appointment is needed if requesting treatment. In order to provide you with excellent quality medical care with proper time for you to explain your problem, have an exam and receive treatment with instructions, these appointments should be limited to one new problem per visit. If you experience a new problem, in which you desire to be addressed, please make an acute office visit, we save openings on the schedule to accommodate you. Please do not save your  new problem for any other type of visit, let us take care of it properly and quickly for you.   Follow up visits:  Depending on your condition(s) your provider will need to see you routinely in order to provide you with quality care and prescribe medication(s). Most chronic conditions (Example: hypertension, Diabetes, depression/anxiety... etc), require visits a couple times a year. Your provider will instruct you on proper follow up for your personal medical conditions and history. Please make certain to make follow up appointments for your condition as instructed. Failing to do so could result in lapse in your medication treatment/refills. If you request a refill, and are overdue to be seen on a condition, we will always provide you with a 30 day script (once) to allow you time to schedule.    Medicare wellness (well visit): - we have a wonderful Nurse (Kim), that will meet with you and provide you will yearly medicare wellness visits. These visits should occur yearly (can not be scheduled less than 1 calendar year apart) and cover preventive health, immunizations, advance directives and screenings you are entitled to yearly through your medicare benefits. Do not miss out on your entitled benefits, this is when medicare will pay for these benefits to be ordered for you.  These are strongly encouraged by your provider and is the appropriate type of visit to make certain you are up to date with all preventive health benefits. If you have not had your medicare wellness exam in the last 12 months, please make certain to schedule one by calling the office and schedule your medicare wellness with Kim as soon as possible.   Yearly physical (well visit):  - Adults are recommended to be seen yearly for physicals. Check with your insurance and date of your last physical, most insurances require one calendar year between physicals. Physicals include all preventive health topics, screenings, medical exam and labs  that are appropriate for gender/age and history. You may have fasting labs needed at this visit. This is a well visit (not a sick visit), new problems should not be covered during this visit (see acute visit).  - Pediatric patients are seen more frequently when they are younger. Your provider will advise you on well child visit timing that is appropriate for your their age. - This is not a medicare wellness visit. Medicare wellness exams do not have an exam portion to the visit. Some medicare companies allow for a physical, some do not allow a yearly physical. If your medicare allows a yearly physical you can schedule the medicare wellness with our nurse Kim and have your physical with your provider after, on the same day. Please check with insurance for your full benefits.   Late Policy/No Shows:  - all new patients should arrive 15-30 minutes earlier than appointment to allow us time  to  obtain all personal demographics,  insurance information and for you to complete office paperwork. - All established patients should arrive 10-15 minutes earlier than appointment time to update all   information and be checked in .  - In our best efforts to run on time, if you are late for your appointment you will be asked to either reschedule or if able, we will work you back into the schedule. There will be a wait time to work you back in the schedule,  depending on availability.  - If you are unable to make it to your appointment as scheduled, please call 24 hours ahead of time to allow us to fill the time slot with someone else who needs to be seen. If you do not cancel your appointment ahead of time, you may be charged a no show fee.  

## 2019-01-19 NOTE — Progress Notes (Signed)
VIRTUAL VISIT VIA VIDEO  I connected with [name] [name] on 01/20/19 at  2:20 PM EDT by a video enabled telemedicine application and verified that I am speaking with the correct person using two identifiers. Location patient: Home Location provider: [facility], Office Persons participating in the virtual visit: Patient, Dr. Raoul Pitch and R.Baker, LPN  I discussed the limitations of evaluation and management by telemedicine and the availability of in person appointments. The patient expressed understanding and agreed to proceed.   SUBJECTIVE Chief Complaint  Patient presents with  . Hypertension    TOC. Pt needs refills on medications. Pt has no complaints.    Patient presents today to transfer of care secondary to his  [place] PCP retiring.  HPI:  Hypertension/hyperlipidemia: Pt reports compliance with Benzapril/HCTZ 20-12.5 mg daily, Zocor 40 mg nightly. Blood pressures ranges at home in normal limits. Patient denies chest pain, shortness of breath or lower extremity edema. Pt takes a daily baby ASA. Pt is  prescribed statin. BMP:04/09/2018 within normal limits-creatinine 0.75, GFR 106 CBC: 04/09/2018 mildly low hematocrit at 37.7 otherwise normal. TSH: 04/09/2018 within normal limits Lipids: 04/09/2018 total cholesterol 137, HDL 54, LDL 71, triglycerides 54 Diet: Monitors his diet Exercise: Routine exercise RF: Hypertension, hyperlipidemia, family history of heart disease and stroke  BPH/arthritis/neuropathy: Patient has a history of arthritis status post shoulder replacement and knee arthroplasty.  He also has a history of BPH.  He states he takes Celebrex 200 mg daily to help with his arthritis and it actually works better for his BPH than any of the other medications tried.  He sustained a lower extremity fracture injury in which she has chronic neuropathy.  Gabapentin helps with his discomfort surrounding neuropathy.  He has no complaints on these conditions today.    ROS: See pertinent positives and negatives per HPI.  Patient Active Problem List   Diagnosis Date Noted  . Neuropathy- chronic s/p trauma 01/20/2019  . Overweight (BMI 25.0-29.9) 01/20/2019  . Sensorineural hearing loss (SNHL), bilateral 06/14/2016  . Impaired glucose tolerance 04/01/2016  . 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 Essential hypertension 06/02/2008  . Dyslipidemia 04/23/2007  . Allergic rhinitis 04/23/2007  . BPH (benign prostatic hyperplasia) 04/23/2007    Class: Present on Admission  . Osteoarthritis 04/23/2007    Social History   Tobacco Use  . Smoking status: Never Smoker  . Smokeless tobacco: Never Used  Substance Use Topics  . Alcohol use: No    Current Outpatient Medications:  .  B Complex Vitamins (VITAMIN-B COMPLEX PO), Take by mouth daily., Disp: , Rfl:  .  benazepril-hydrochlorthiazide (LOTENSIN HCT) 20-12.5 MG tablet, Take 1 tablet by mouth every morning., Disp: 90 tablet, Rfl: 1 .  celecoxib (CELEBREX) 200 MG capsule, Take 1 capsule (200 mg total) by mouth daily., Disp: 90 capsule, Rfl: 1 .  Cholecalciferol (VITAMIN D3) 5000 UNITS CAPS, Take 1,000 Units by mouth daily., Disp: , Rfl:  .  Coenzyme Q10 (CO Q 10 PO), Take 1 tablet by mouth daily. , Disp: , Rfl:  .  fluticasone (FLONASE) 50 MCG/ACT nasal spray, Place 1 spray into both nostrils daily., Disp: 32 g, Rfl: 3 .  gabapentin (NEURONTIN) 600 MG tablet, Take 1 tablet (600 mg total) by mouth 3 (three) times daily., Disp: 270 tablet, Rfl: 1 .  Glucosamine-Chondroit-Vit C-Mn (GLUCOSAMINE CHONDR 1500 COMPLX) CAPS, Take 1 capsule by mouth daily., Disp: , Rfl:  .  hydroxypropyl methylcellulose / hypromellose (ISOPTO TEARS /  GONIOVISC) 2.5 % ophthalmic solution, Place 1 drop into both eyes as needed for dry eyes., Disp: , Rfl:  .  loratadine (CLARITIN) 10 MG tablet, Take 10 mg by mouth every evening. , Disp: , Rfl:  .  Multiple Vitamin (MULTIVITAMIN) tablet, Take 1  tablet by mouth daily., Disp: , Rfl:  .  potassium citrate (UROCIT-K) 10 MEQ (1080 MG) SR tablet, TAKE 2 TABLETS BY MOUTH  TWICE DAILY WITH A MEAL, Disp: 400 tablet, Rfl: 3 .  psyllium (HYDROCIL/METAMUCIL) 95 % PACK, Take 2 packets by mouth daily. , Disp: , Rfl:  .  simvastatin (ZOCOR) 40 MG tablet, Take 1 tablet (40 mg total) by mouth at bedtime., Disp: 90 tablet, Rfl: 1 .  aspirin 81 MG tablet, Take 81 mg by mouth daily., Disp: , Rfl:   No Known Allergies  OBJECTIVE: BP 126/62   Pulse 62   Ht 5\' 9"  (1.753 m)   BMI 26.85 kg/m  Gen: No acute distress. Nontoxic in appearance.  HENT: AT. [place].  MMM.  Eyes:Pupils Equal Round Reactive to light, Extraocular movements intact,  Conjunctiva without redness, discharge or icterus. CV: no edema Chest: Cough or shortness of breath not present Neuro:  Normal gait. Alert. Oriented x3  Psych: Normal affect, dress and demeanor. Normal speech. Normal thought content and judgment.  ASSESSMENT AND PLAN: [name] [name] is a 83 y.o. male present for TOC.  Benign prostatic hyperplasia without lower urinary tract symptoms - stable. Reports Celebrex works for arthritis and his BPH better than nay other med tried.   Dyslipidemia/Essential hypertension/Overweight (BMI 25.0-29.9) -Stable.   - Continue benazepril-HCTZ 20-12.5 mg daily. - Continue Zocor 40 mg daily. - continue baby aspirin - labs due next visit.  - low sodium diet and routine exercise.  - benazepril-hydrochlorthiazide (LOTENSIN HCT) 20-12.5 MG tablet; Take 1 tablet by mouth every morning.  Dispense: 90 tablet; Refill: 1  Neuropathy- chronic s/p trauma -Stable.  Status post trauma to lower extremity. -Continue gabapentin 600 mg 3 times daily.  Refills provided today.  Primary osteoarthritis involving multiple joints -Stable.  Continue Celebrex 200 mg daily.  Refills provided today.  Follow-up in 6 months provider appointment on chronic medical conditions labs will be due at that  time-patient was encouraged to fast  > 25 minutes spent with patient, >50% of time spent face to face counseling and coordinating care.     Howard Pouch, DO 01/20/2019

## 2019-01-20 ENCOUNTER — Encounter: Payer: Self-pay | Admitting: Family Medicine

## 2019-01-20 DIAGNOSIS — G629 Polyneuropathy, unspecified: Secondary | ICD-10-CM | POA: Insufficient documentation

## 2019-01-20 DIAGNOSIS — E663 Overweight: Secondary | ICD-10-CM | POA: Insufficient documentation

## 2019-01-20 MED ORDER — SIMVASTATIN 40 MG PO TABS: 40.0000 mg | ORAL_TABLET | Freq: Every day | ORAL | 1 refills | Status: DC

## 2019-01-20 MED ORDER — BENAZEPRIL-HYDROCHLOROTHIAZIDE 20-12.5 MG PO TABS: 1.0000 | ORAL_TABLET | Freq: Every morning | ORAL | 1 refills | Status: DC

## 2019-01-20 MED ORDER — CELECOXIB 200 MG PO CAPS: 200.0000 mg | ORAL_CAPSULE | Freq: Every day | ORAL | 1 refills | Status: DC

## 2019-01-20 MED ORDER — GABAPENTIN 600 MG PO TABS: 600.0000 mg | ORAL_TABLET | Freq: Three times a day (TID) | ORAL | 1 refills | Status: DC

## 2019-03-17 DIAGNOSIS — B079 Viral wart, unspecified: Secondary | ICD-10-CM | POA: Diagnosis not present

## 2019-03-17 DIAGNOSIS — D044 Carcinoma in situ of skin of scalp and neck: Secondary | ICD-10-CM | POA: Diagnosis not present

## 2019-03-17 DIAGNOSIS — D225 Melanocytic nevi of trunk: Secondary | ICD-10-CM | POA: Diagnosis not present

## 2019-03-17 DIAGNOSIS — L814 Other melanin hyperpigmentation: Secondary | ICD-10-CM | POA: Diagnosis not present

## 2019-03-17 DIAGNOSIS — D485 Neoplasm of uncertain behavior of skin: Secondary | ICD-10-CM | POA: Diagnosis not present

## 2019-03-17 DIAGNOSIS — Z85828 Personal history of other malignant neoplasm of skin: Secondary | ICD-10-CM | POA: Diagnosis not present

## 2019-03-17 DIAGNOSIS — L821 Other seborrheic keratosis: Secondary | ICD-10-CM | POA: Diagnosis not present

## 2019-03-17 DIAGNOSIS — C44619 Basal cell carcinoma of skin of left upper limb, including shoulder: Secondary | ICD-10-CM | POA: Diagnosis not present

## 2019-03-17 DIAGNOSIS — L570 Actinic keratosis: Secondary | ICD-10-CM | POA: Diagnosis not present

## 2019-03-30 DIAGNOSIS — C44619 Basal cell carcinoma of skin of left upper limb, including shoulder: Secondary | ICD-10-CM | POA: Diagnosis not present

## 2019-03-30 DIAGNOSIS — D044 Carcinoma in situ of skin of scalp and neck: Secondary | ICD-10-CM | POA: Diagnosis not present

## 2019-03-30 DIAGNOSIS — R972 Elevated prostate specific antigen [PSA]: Secondary | ICD-10-CM | POA: Diagnosis not present

## 2019-03-30 DIAGNOSIS — Z85828 Personal history of other malignant neoplasm of skin: Secondary | ICD-10-CM | POA: Diagnosis not present

## 2019-04-06 DIAGNOSIS — R351 Nocturia: Secondary | ICD-10-CM | POA: Diagnosis not present

## 2019-04-06 DIAGNOSIS — N401 Enlarged prostate with lower urinary tract symptoms: Secondary | ICD-10-CM | POA: Diagnosis not present

## 2019-04-06 DIAGNOSIS — R972 Elevated prostate specific antigen [PSA]: Secondary | ICD-10-CM | POA: Diagnosis not present

## 2019-04-28 ENCOUNTER — Other Ambulatory Visit: Payer: Self-pay

## 2019-04-28 NOTE — Telephone Encounter (Signed)
Faxed refill request from [facility]  RF request for Potassium Citrate ER 10MEQ Tabs  LOV:01/19/2019 Next ov: 07/01/2019 Last written: 04/09/2018 #400 x3 refills by Dr Burnice Logan (looks to be prior PCP)   Please advise

## 2019-04-29 ENCOUNTER — Emergency Department (HOSPITAL_COMMUNITY): Payer: PPO

## 2019-04-29 ENCOUNTER — Encounter (HOSPITAL_COMMUNITY): Payer: Self-pay | Admitting: Emergency Medicine

## 2019-04-29 ENCOUNTER — Observation Stay (HOSPITAL_COMMUNITY): Payer: PPO

## 2019-04-29 ENCOUNTER — Other Ambulatory Visit: Payer: Self-pay

## 2019-04-29 ENCOUNTER — Observation Stay (HOSPITAL_COMMUNITY)
Admission: EM | Admit: 2019-04-29 | Discharge: 2019-04-30 | Disposition: A | Discharge status: Home / Self Care | Payer: PPO | Attending: Internal Medicine | Admitting: Internal Medicine

## 2019-04-29 DIAGNOSIS — Z03818 Encounter for observation for suspected exposure to other biological agents ruled out: Secondary | ICD-10-CM | POA: Diagnosis not present

## 2019-04-29 DIAGNOSIS — H5340 Unspecified visual field defects: Principal | ICD-10-CM | POA: Diagnosis present

## 2019-04-29 DIAGNOSIS — G8929 Other chronic pain: Secondary | ICD-10-CM | POA: Diagnosis not present

## 2019-04-29 DIAGNOSIS — H35033 Hypertensive retinopathy, bilateral: Secondary | ICD-10-CM | POA: Diagnosis not present

## 2019-04-29 DIAGNOSIS — R600 Localized edema: Secondary | ICD-10-CM | POA: Insufficient documentation

## 2019-04-29 DIAGNOSIS — Z79899 Other long term (current) drug therapy: Secondary | ICD-10-CM | POA: Insufficient documentation

## 2019-04-29 DIAGNOSIS — Z20828 Contact with and (suspected) exposure to other viral communicable diseases: Secondary | ICD-10-CM | POA: Insufficient documentation

## 2019-04-29 DIAGNOSIS — H539 Unspecified visual disturbance: Secondary | ICD-10-CM | POA: Diagnosis not present

## 2019-04-29 DIAGNOSIS — Z96652 Presence of left artificial knee joint: Secondary | ICD-10-CM | POA: Diagnosis not present

## 2019-04-29 DIAGNOSIS — Z7982 Long term (current) use of aspirin: Secondary | ICD-10-CM | POA: Insufficient documentation

## 2019-04-29 DIAGNOSIS — R2689 Other abnormalities of gait and mobility: Secondary | ICD-10-CM | POA: Diagnosis not present

## 2019-04-29 DIAGNOSIS — I6523 Occlusion and stenosis of bilateral carotid arteries: Secondary | ICD-10-CM | POA: Diagnosis not present

## 2019-04-29 DIAGNOSIS — I10 Essential (primary) hypertension: Secondary | ICD-10-CM | POA: Diagnosis present

## 2019-04-29 DIAGNOSIS — H538 Other visual disturbances: Secondary | ICD-10-CM | POA: Diagnosis not present

## 2019-04-29 DIAGNOSIS — E785 Hyperlipidemia, unspecified: Secondary | ICD-10-CM | POA: Diagnosis present

## 2019-04-29 DIAGNOSIS — G453 Amaurosis fugax: Secondary | ICD-10-CM | POA: Diagnosis not present

## 2019-04-29 DIAGNOSIS — G459 Transient cerebral ischemic attack, unspecified: Secondary | ICD-10-CM

## 2019-04-29 DIAGNOSIS — H2513 Age-related nuclear cataract, bilateral: Secondary | ICD-10-CM | POA: Diagnosis not present

## 2019-04-29 DIAGNOSIS — Z96651 Presence of right artificial knee joint: Secondary | ICD-10-CM | POA: Insufficient documentation

## 2019-04-29 DIAGNOSIS — I6503 Occlusion and stenosis of bilateral vertebral arteries: Secondary | ICD-10-CM | POA: Diagnosis not present

## 2019-04-29 HISTORY — DX: Unspecified visual field defects: H53.40

## 2019-04-29 HISTORY — DX: Other chronic pain: G89.29

## 2019-04-29 LAB — COMPREHENSIVE METABOLIC PANEL
ALT: 19 U/L (ref 0–44)
AST: 20 U/L (ref 15–41)
Albumin: 4.1 g/dL (ref 3.5–5.0)
Alkaline Phosphatase: 52 U/L (ref 38–126)
Anion gap: 11 (ref 5–15)
BUN: 35 mg/dL — ABNORMAL HIGH (ref 8–23)
CO2: 20 mmol/L — ABNORMAL LOW (ref 22–32)
Calcium: 9.4 mg/dL (ref 8.9–10.3)
Chloride: 109 mmol/L (ref 98–111)
Creatinine, Ser: 0.64 mg/dL (ref 0.61–1.24)
GFR calc Af Amer: >60 mL/min (ref >60)
GFR calc non Af Amer: >60 mL/min (ref >60)
Glucose, Bld: 102 mg/dL — ABNORMAL HIGH (ref 70–99)
Potassium: 3.6 mmol/L (ref 3.5–5.1)
Sodium: 140 mmol/L (ref 135–145)
Total Bilirubin: 0.8 mg/dL (ref 0.3–1.2)
Total Protein: 6.8 g/dL (ref 6.5–8.1)

## 2019-04-29 LAB — CBC
HCT: 38.7 % — ABNORMAL LOW (ref 39.0–52.0)
Hemoglobin: 13 g/dL (ref 13.0–17.0)
MCH: 31 pg (ref 26.0–34.0)
MCHC: 33.6 g/dL (ref 30.0–36.0)
MCV: 92.4 fL (ref 80.0–100.0)
Platelets: 172 10*3/uL (ref 150–400)
RBC: 4.19 MIL/uL — ABNORMAL LOW (ref 4.22–5.81)
RDW: 13.7 % (ref 11.5–15.5)
WBC: 8.7 10*3/uL (ref 4.0–10.5)
nRBC: 0 % (ref 0.0–0.2)

## 2019-04-29 LAB — DIFFERENTIAL
Abs Immature Granulocytes: 0.02 10*3/uL (ref 0.00–0.07)
Basophils Absolute: 0 10*3/uL (ref 0.0–0.1)
Basophils Relative: 1 %
Eosinophils Absolute: 0.3 10*3/uL (ref 0.0–0.5)
Eosinophils Relative: 3 %
Immature Granulocytes: 0 %
Lymphocytes Relative: 19 %
Lymphs Abs: 1.7 10*3/uL (ref 0.7–4.0)
Monocytes Absolute: 0.7 10*3/uL (ref 0.1–1.0)
Monocytes Relative: 8 %
Neutro Abs: 6.1 10*3/uL (ref 1.7–7.7)
Neutrophils Relative %: 69 %

## 2019-04-29 LAB — APTT: aPTT: 27 seconds (ref 24–36)

## 2019-04-29 LAB — PROTIME-INR
INR: 1.1 (ref 0.8–1.2)
Prothrombin Time: 13.6 seconds (ref 11.4–15.2)

## 2019-04-29 LAB — SARS CORONAVIRUS 2 (TAT 6-24 HRS): SARS Coronavirus 2: NEGATIVE

## 2019-04-29 LAB — CBG MONITORING, ED: Glucose-Capillary: 100 mg/dL — ABNORMAL HIGH (ref 70–99)

## 2019-04-29 MED ORDER — ATORVASTATIN CALCIUM 80 MG PO TABS
80.0000 mg | ORAL_TABLET | Freq: Every day | ORAL | Status: DC
  Administered 2019-04-30: 80 mg via ORAL
  Filled 2019-04-29: qty 1

## 2019-04-29 MED ORDER — SODIUM CHLORIDE 0.9% FLUSH
3.0000 mL | Freq: Once | INTRAVENOUS | Status: AC
  Administered 2019-04-29: 3 mL via INTRAVENOUS

## 2019-04-29 MED ORDER — LORATADINE 10 MG PO TABS
10.0000 mg | ORAL_TABLET | Freq: Every day | ORAL | Status: DC
  Administered 2019-04-30: 10 mg via ORAL
  Filled 2019-04-29: qty 1

## 2019-04-29 MED ORDER — HYPROMELLOSE (GONIOSCOPIC) 2.5 % OP SOLN
1.0000 [drp] | OPHTHALMIC | Status: DC | PRN
  Filled 2019-04-29: qty 15

## 2019-04-29 MED ORDER — IOHEXOL 350 MG/ML SOLN
100.0000 mL | Freq: Once | INTRAVENOUS | Status: AC | PRN
  Administered 2019-04-29: 100 mL via INTRAVENOUS

## 2019-04-29 MED ORDER — ASPIRIN 325 MG PO TABS
325.0000 mg | ORAL_TABLET | Freq: Every day | ORAL | Status: DC
  Administered 2019-04-30 (×2): 325 mg via ORAL
  Filled 2019-04-29 (×2): qty 1

## 2019-04-29 MED ORDER — ENOXAPARIN SODIUM 40 MG/0.4ML ~~LOC~~ SOLN
40.0000 mg | Freq: Every day | SUBCUTANEOUS | Status: DC
  Administered 2019-04-30: 10:00:00 40 mg via SUBCUTANEOUS
  Filled 2019-04-29: qty 0.4

## 2019-04-29 MED ORDER — VITAMIN D 25 MCG (1000 UNIT) PO TABS
1000.0000 [IU] | ORAL_TABLET | Freq: Every day | ORAL | Status: DC
  Administered 2019-04-30: 1000 [IU] via ORAL
  Filled 2019-04-29: qty 1

## 2019-04-29 MED ORDER — FLUTICASONE PROPIONATE 50 MCG/ACT NA SUSP
1.0000 | Freq: Every day | NASAL | Status: DC | PRN
  Filled 2019-04-29: qty 16

## 2019-04-29 MED ORDER — CELECOXIB 200 MG PO CAPS
200.0000 mg | ORAL_CAPSULE | Freq: Every day | ORAL | Status: DC
  Administered 2019-04-30: 200 mg via ORAL
  Filled 2019-04-29: qty 1

## 2019-04-29 MED ORDER — GABAPENTIN 300 MG PO CAPS
600.0000 mg | ORAL_CAPSULE | Freq: Every day | ORAL | Status: DC
  Administered 2019-04-30: 01:00:00 600 mg via ORAL
  Filled 2019-04-29 (×2): qty 2

## 2019-04-29 MED ORDER — ACETAMINOPHEN 650 MG RE SUPP: 650.0000 mg | RECTAL | Status: DC | PRN

## 2019-04-29 MED ORDER — STROKE: EARLY STAGES OF RECOVERY BOOK
Freq: Once | Status: AC
  Administered 2019-04-30: 01:00:00
  Filled 2019-04-29: qty 1

## 2019-04-29 MED ORDER — VITAMIN D3 25 MCG PO TABS: 1000.0000 [IU] | ORAL_TABLET | Freq: Every day | ORAL | Status: DC

## 2019-04-29 MED ORDER — KRILL OIL 1000 MG PO CAPS: 2000.0000 mg | ORAL_CAPSULE | Freq: Every day | ORAL | Status: DC

## 2019-04-29 MED ORDER — SENNOSIDES-DOCUSATE SODIUM 8.6-50 MG PO TABS: 1.0000 | ORAL_TABLET | Freq: Every evening | ORAL | Status: DC | PRN

## 2019-04-29 MED ORDER — VITAMIN C 500 MG PO TABS
1500.0000 mg | ORAL_TABLET | Freq: Every day | ORAL | Status: DC
  Administered 2019-04-30: 1500 mg via ORAL
  Filled 2019-04-29: qty 3

## 2019-04-29 MED ORDER — ACETAMINOPHEN 160 MG/5ML PO SOLN: 650.0000 mg | ORAL | Status: DC | PRN

## 2019-04-29 MED ORDER — ACETAMINOPHEN 325 MG PO TABS: 650.0000 mg | ORAL_TABLET | ORAL | Status: DC | PRN

## 2019-04-29 MED ORDER — COQ10 200 MG PO CAPS: 200.0000 mg | ORAL_CAPSULE | Freq: Every day | ORAL | Status: DC
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
## 2019-04-29 NOTE — ED Notes (Signed)
Patient transported to CT 

## 2019-04-29 NOTE — Addendum Note (Signed)
Addended by: Howard Pouch A on: 04/29/2019 01:49 PM   Modules accepted: Orders
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
## 2019-04-29 NOTE — ED Notes (Signed)
Pt transported to MRI, 3W aware of delay.

## 2019-04-29 NOTE — ED Notes (Signed)
Pt returned from CT °
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
## 2019-04-29 NOTE — ED Triage Notes (Signed)
Pt reports approximately 9:30 am today he had visual field change where the lower half of his vision was black. He reported this changed to gray, then completely disappeared. Pt reports while working in the yard he felt off balance. Pt was seen at eye doctor and told come her. Pt reports they dilated his eyes. Pt does not have drift to either extremity.

## 2019-04-29 NOTE — Telephone Encounter (Signed)
Called patient and lvm to cb in regards to rx

## 2019-04-29 NOTE — Telephone Encounter (Signed)
Call pt and ask if he wants/needs refills and called to where. If so may refill.
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
## 2019-04-30 ENCOUNTER — Observation Stay (HOSPITAL_BASED_OUTPATIENT_CLINIC_OR_DEPARTMENT_OTHER): Payer: PPO

## 2019-04-30 ENCOUNTER — Inpatient Hospital Stay (HOSPITAL_BASED_OUTPATIENT_CLINIC_OR_DEPARTMENT_OTHER): Payer: PPO

## 2019-04-30 DIAGNOSIS — I340 Nonrheumatic mitral (valve) insufficiency: Secondary | ICD-10-CM

## 2019-04-30 DIAGNOSIS — H5340 Unspecified visual field defects: Secondary | ICD-10-CM

## 2019-04-30 DIAGNOSIS — I351 Nonrheumatic aortic (valve) insufficiency: Secondary | ICD-10-CM

## 2019-04-30 DIAGNOSIS — G453 Amaurosis fugax: Secondary | ICD-10-CM

## 2019-04-30 DIAGNOSIS — I639 Cerebral infarction, unspecified: Secondary | ICD-10-CM

## 2019-04-30 LAB — LIPID PANEL
Cholesterol: 122 mg/dL (ref 0–200)
HDL: 56 mg/dL (ref >40)
LDL Cholesterol: 59 mg/dL (ref 0–99)
Total CHOL/HDL Ratio: 2.2 RATIO
Triglycerides: 34 mg/dL (ref <150)
VLDL: 7 mg/dL (ref 0–40)

## 2019-04-30 LAB — SEDIMENTATION RATE: Sed Rate: 2 mm/hr (ref 0–16)

## 2019-04-30 LAB — ECHOCARDIOGRAM COMPLETE
Height: 71 in
Weight: 2892.44 oz

## 2019-04-30 LAB — C-REACTIVE PROTEIN: CRP: <0.8 mg/dL (ref <1.0)

## 2019-04-30 LAB — HEMOGLOBIN A1C
Hgb A1c MFr Bld: 5.3 % (ref 4.8–5.6)
Mean Plasma Glucose: 105.41 mg/dL

## 2019-04-30 LAB — VITAMIN B12: Vitamin B-12: 499 pg/mL (ref 180–914)

## 2019-04-30 LAB — TSH: TSH: 4.923 u[IU]/mL — ABNORMAL HIGH (ref 0.350–4.500)

## 2019-04-30 MED ORDER — ASPIRIN EC 81 MG PO TBEC
81.0000 mg | DELAYED_RELEASE_TABLET | Freq: Every day | ORAL | Status: DC
  Administered 2019-04-30: 16:00:00 81 mg via ORAL
  Filled 2019-04-30: qty 1

## 2019-04-30 NOTE — Progress Notes (Signed)
SLP Cancellation Note  Patient Details Name: [name] [name] MRN: [id number] DOB: 10/21/1935   Cancelled treatment:       Reason Eval/Treat Not Completed: SLP screened, no needs identified, will sign off. Pt is an 83 y.o. male with medical history significant of hypertension, hyperlipidemia who was sent to the hospital from ophthalmology for evaluation of visual field changes. MRI was negative for acute changes. Based on chart review and on the reports of the pt, nursing, and his family, the pt is not demonstrating any speech, language or cognitive deficits at this time. SLP services are therefor not clinically indicated and SLP will sign off.   Luca Burston I. Hardin Negus, [place], [place] Office number [phone] Pager [place] 04/30/2019, 4:59 PM

## 2019-04-30 NOTE — Evaluation (Signed)
Occupational Therapy Evaluation Patient Details Name: [name] [name] MRN: [id number] DOB: 12/03/1935 Today's Date: 04/30/2019    History of Present Illness Pt is an 83 yo male s/p visual deficits that have resolved now. Pt PMHx: HTN, HLD, TKA, L foot sx, L TSA, BPH.   Clinical Impression   Pt PTA: living with spouse and describes a very active lifestyle. Pt currently with no focal deficits in all fields- vision, sensation, proprioception and no leaning with mobility or sitting tasks. Pt independent with mobility. Pt 24/24 on Dynamic Gait Index and managing stairs well. Pt independent for ADL. Pt does not require continued skilled OT acute care. OT signing off. (No PT needs)    Follow Up Recommendations  No OT follow up    Equipment Recommendations  None recommended by OT    Recommendations for Other Services Other (comment)(No need for PT eval. Pt is independent)     Precautions / Restrictions Precautions Precautions: None Restrictions Weight Bearing Restrictions: No      Mobility Bed Mobility Overal bed mobility: Modified Independent                Transfers Overall transfer level: Modified independent                    Balance Overall balance assessment: Modified Independent                               Standardized Balance Assessment Standardized Balance Assessment : Dynamic Gait Index   Dynamic Gait Index Level Surface: Normal Change in Gait Speed: Normal Gait with Horizontal Head Turns: Normal Gait with Vertical Head Turns: Normal Gait and Pivot Turn: Normal Step Over Obstacle: Normal Step Around Obstacles: Normal Steps: Normal Total Score: 24     ADL either performed or assessed with clinical judgement   ADL Overall ADL's : Modified independent                                       General ADL Comments: No physical assist required. pt cautious.     Vision Baseline Vision/History: Wears  glasses Wears Glasses: At all times Patient Visual Report: No change from baseline Vision Assessment?: No apparent visual deficits     Perception     Praxis      Pertinent Vitals/Pain       Hand Dominance Right   Extremity/Trunk Assessment Upper Extremity Assessment Upper Extremity Assessment: Overall WFL for tasks assessed   Lower Extremity Assessment Lower Extremity Assessment: Overall WFL for tasks assessed   Cervical / Trunk Assessment Cervical / Trunk Assessment: Normal   Communication Communication Communication: No difficulties   Cognition Arousal/Alertness: Awake/alert Behavior During Therapy: WFL for tasks assessed/performed Overall Cognitive Status: Within Functional Limits for tasks assessed                                     General Comments  Pt with no focal deficits in all fields- vision, sensation, proprioception and no leaning with mobility or sitting tasks.    Exercises     Shoulder Instructions      Home Living Family/patient expects to be discharged to:: Private residence Living Arrangements: Spouse/significant other Available Help at Discharge: Family;Available 24 hours/day Type of Home: House Home Access: Stairs  to enter Entrance Stairs-Number of Steps: 2 Entrance Stairs-Rails: None Home Layout: Two level;Able to live on main level with bedroom/bathroom Alternate Level Stairs-Number of Steps: full flight   Bathroom Shower/Tub: Teacher, early years/pre: Standard     Home Equipment: Environmental consultant - 2 wheels;Wheelchair - manual          Prior Functioning/Environment Level of Independence: Independent                 OT Problem List:        OT Treatment/Interventions:      OT Goals(Current goals can be found in the care plan section)    OT Frequency:     Barriers to D/C:            Co-evaluation              AM-PAC OT "6 Clicks" Daily Activity     Outcome Measure Help from another person  eating meals?: None Help from another person taking care of personal grooming?: None Help from another person toileting, which includes using toliet, bedpan, or urinal?: None Help from another person bathing (including washing, rinsing, drying)?: None Help from another person to put on and taking off regular upper body clothing?: None Help from another person to put on and taking off regular lower body clothing?: None 6 Click Score: 24   End of Session Equipment Utilized During Treatment: Gait belt Nurse Communication: Mobility status  Activity Tolerance: Patient tolerated treatment well Patient left: in chair;with call bell/phone within reach  OT Visit Diagnosis: Unsteadiness on feet (R26.81)                Time: 5726-2035 OT Time Calculation (min): 17 min Charges:  OT General Charges $OT Visit: 1 Visit OT Evaluation $OT Eval Moderate Complexity: 1 Mod  Darryl Nestle) Marsa Aris OTR/L Acute Rehabilitation Services Pager: [phone] Office: [place] 04/30/2019, 2:07 PM

## 2019-04-30 NOTE — TOC Transition Note (Signed)
Transition of Care [facility]) - CM/SW Discharge Note   Patient Details  Name: [name] [name] MRN: [id number] Date of Birth: [birth year]  [facility] CM/SW Contact:  Pollie Friar, RN Phone Number: 04/30/2019, 2:24 PM   Clinical Narrative:    Pt is discharging home with self care. No f/u per PT/OT and no DME needs.  Pt has PcP, insurance and transportation home.   Final next level of care: Home/Self Care Barriers to Discharge: No Barriers Identified   Patient Goals and CMS Choice        Discharge Placement                       Discharge Plan and Services                                     Social Determinants of Health (SDOH) Interventions     Readmission Risk Interventions No flowsheet data found.

## 2019-04-30 NOTE — Plan of Care (Signed)
Problem: Education: Goal: Knowledge of General Education information will improve Description: Including pain rating scale, medication(s)/side effects and non-pharmacologic comfort measures 04/30/2019 1635 by Myriam Forehand, RN Outcome: Adequate for Discharge 04/30/2019 1634 by Myriam Forehand, RN Outcome: Adequate for Discharge 04/30/2019 1634 by Myriam Forehand, RN Outcome: Adequate for Discharge 04/30/2019 1632 by Myriam Forehand, RN Outcome: Adequate for Discharge 04/30/2019 1632 by Myriam Forehand, RN Outcome: Adequate for Discharge   Problem: Health Behavior/Discharge Planning: Goal: Ability to manage health-related needs will improve 04/30/2019 1635 by Myriam Forehand, RN Outcome: Adequate for Discharge 04/30/2019 1634 by Myriam Forehand, RN Outcome: Adequate for Discharge 04/30/2019 1634 by Myriam Forehand, RN Outcome: Adequate for Discharge 04/30/2019 1632 by Myriam Forehand, RN Outcome: Adequate for Discharge 04/30/2019 1632 by Myriam Forehand, RN Outcome: Adequate for Discharge   Problem: Clinical Measurements: Goal: Ability to maintain clinical measurements within normal limits will improve 04/30/2019 1635 by Myriam Forehand, RN Outcome: Adequate for Discharge 04/30/2019 1634 by Myriam Forehand, RN Outcome: Adequate for Discharge 04/30/2019 1634 by Myriam Forehand, RN Outcome: Adequate for Discharge 04/30/2019 1632 by Myriam Forehand, RN Outcome: Adequate for Discharge 04/30/2019 1632 by Myriam Forehand, RN Outcome: Adequate for Discharge   Problem: Activity: Goal: Risk for activity intolerance will decrease 04/30/2019 1635 by Myriam Forehand, RN Outcome: Adequate for Discharge 04/30/2019 1634 by Myriam Forehand, RN Outcome: Adequate for Discharge 04/30/2019 1634 by Myriam Forehand, RN Outcome: Adequate for Discharge 04/30/2019 1632 by Myriam Forehand, RN Outcome: Adequate for Discharge 04/30/2019 1632 by Myriam Forehand, RN Outcome: Adequate for Discharge   Problem: Nutrition: Goal: Adequate nutrition will  be maintained 04/30/2019 1635 by Myriam Forehand, RN Outcome: Adequate for Discharge 04/30/2019 1634 by Myriam Forehand, RN Outcome: Adequate for Discharge 04/30/2019 1634 by Myriam Forehand, RN Outcome: Adequate for Discharge 04/30/2019 1632 by Myriam Forehand, RN Outcome: Adequate for Discharge 04/30/2019 1632 by Myriam Forehand, RN Outcome: Adequate for Discharge   Problem: Elimination: Goal: Will not experience complications related to bowel motility 04/30/2019 1635 by Myriam Forehand, RN Outcome: Adequate for Discharge 04/30/2019 1634 by Myriam Forehand, RN Outcome: Adequate for Discharge 04/30/2019 1634 by Myriam Forehand, RN Outcome: Adequate for Discharge 04/30/2019 1632 by Myriam Forehand, RN Outcome: Adequate for Discharge 04/30/2019 1632 by Myriam Forehand, RN Outcome: Adequate for Discharge   Problem: Pain Managment: Goal: General experience of comfort will improve 04/30/2019 1635 by Myriam Forehand, RN Outcome: Adequate for Discharge 04/30/2019 1634 by Myriam Forehand, RN Outcome: Adequate for Discharge 04/30/2019 1634 by Myriam Forehand, RN Outcome: Adequate for Discharge 04/30/2019 1632 by Myriam Forehand, RN Outcome: Adequate for Discharge 04/30/2019 1632 by Myriam Forehand, RN Outcome: Adequate for Discharge   Problem: Safety: Goal: Ability to remain free from injury will improve 04/30/2019 1635 by Myriam Forehand, RN Outcome: Adequate for Discharge 04/30/2019 1634 by Myriam Forehand, RN Outcome: Adequate for Discharge 04/30/2019 1634 by Myriam Forehand, RN Outcome: Adequate for Discharge 04/30/2019 1632 by Myriam Forehand, RN Outcome: Adequate for Discharge 04/30/2019 1632 by Myriam Forehand, RN Outcome: Adequate for Discharge   Problem: Skin Integrity: Goal: Risk for impaired skin integrity will decrease 04/30/2019 1635 by Myriam Forehand, RN Outcome: Adequate for Discharge 04/30/2019 1634 by Myriam Forehand, RN Outcome: Adequate for Discharge 04/30/2019 1634 by Myriam Forehand, RN Outcome: Adequate for  Discharge 04/30/2019 1632 by Myriam Forehand, RN Outcome: Adequate for Discharge 04/30/2019 1632 by Myriam Forehand, RN Outcome: Adequate for Discharge   Problem: Education: Goal: Knowledge of disease or condition will improve 04/30/2019 1635 by Myriam Forehand, RN Outcome: Adequate for Discharge 04/30/2019 1634 by  Myriam Forehand, RN Outcome: Adequate for Discharge 04/30/2019 1634 by Myriam Forehand, RN Outcome: Adequate for Discharge 04/30/2019 1632 by Myriam Forehand, RN Outcome: Adequate for Discharge 04/30/2019 1632 by Myriam Forehand, RN Outcome: Adequate for Discharge Goal: Knowledge of secondary prevention will improve 04/30/2019 1635 by Myriam Forehand, RN Outcome: Adequate for Discharge 04/30/2019 1634 by Myriam Forehand, RN Outcome: Adequate for Discharge 04/30/2019 1634 by Myriam Forehand, RN Outcome: Adequate for Discharge 04/30/2019 1632 by Myriam Forehand, RN Outcome: Adequate for Discharge 04/30/2019 1632 by Myriam Forehand, RN Outcome: Adequate for Discharge Goal: Knowledge of patient specific risk factors addressed and post discharge goals established will improve 04/30/2019 1635 by Myriam Forehand, RN Outcome: Adequate for Discharge 04/30/2019 1634 by Myriam Forehand, RN Outcome: Adequate for Discharge 04/30/2019 1634 by Myriam Forehand, RN Outcome: Adequate for Discharge 04/30/2019 1632 by Myriam Forehand, RN Outcome: Adequate for Discharge 04/30/2019 1632 by Myriam Forehand, RN Outcome: Adequate for Discharge   Problem: Coping: Goal: Will verbalize positive feelings about self 04/30/2019 1635 by Myriam Forehand, RN Outcome: Adequate for Discharge 04/30/2019 1634 by Myriam Forehand, RN Outcome: Adequate for Discharge 04/30/2019 1634 by Myriam Forehand, RN Outcome: Adequate for Discharge 04/30/2019 1632 by Myriam Forehand, RN Outcome: Adequate for Discharge 04/30/2019 1632 by Myriam Forehand, RN Outcome: Adequate for Discharge   Problem: Health Behavior/Discharge Planning: Goal: Ability to manage  health-related needs will improve 04/30/2019 1635 by Myriam Forehand, RN Outcome: Adequate for Discharge 04/30/2019 1634 by Myriam Forehand, RN Outcome: Adequate for Discharge 04/30/2019 1634 by Myriam Forehand, RN Outcome: Adequate for Discharge 04/30/2019 1632 by Myriam Forehand, RN Outcome: Adequate for Discharge 04/30/2019 1632 by Myriam Forehand, RN Outcome: Adequate for Discharge   Problem: Self-Care: Goal: Ability to participate in self-care as condition permits will improve 04/30/2019 1635 by Myriam Forehand, RN Outcome: Adequate for Discharge 04/30/2019 1634 by Myriam Forehand, RN Outcome: Adequate for Discharge 04/30/2019 1634 by Myriam Forehand, RN Outcome: Adequate for Discharge 04/30/2019 1632 by Myriam Forehand, RN Outcome: Adequate for Discharge 04/30/2019 1632 by Myriam Forehand, RN Outcome: Adequate for Discharge   Problem: Nutrition: Goal: Dietary intake will improve 04/30/2019 1635 by Myriam Forehand, RN Outcome: Adequate for Discharge 04/30/2019 1634 by Myriam Forehand, RN Outcome: Adequate for Discharge 04/30/2019 1634 by Myriam Forehand, RN Outcome: Adequate for Discharge 04/30/2019 1632 by Myriam Forehand, RN Outcome: Adequate for Discharge 04/30/2019 1632 by Myriam Forehand, RN Outcome: Adequate for Discharge   Problem: Ischemic Stroke/TIA Tissue Perfusion: Goal: Complications of ischemic stroke/TIA will be minimized 04/30/2019 1635 by Myriam Forehand, RN Outcome: Adequate for Discharge 04/30/2019 1634 by Myriam Forehand, RN Outcome: Adequate for Discharge 04/30/2019 1634 by Myriam Forehand, RN Outcome: Adequate for Discharge 04/30/2019 1632 by Myriam Forehand, RN Outcome: Adequate for Discharge 04/30/2019 1632 by Myriam Forehand, RN Outcome: Adequate for Discharge

## 2019-04-30 NOTE — Progress Notes (Signed)
Bilateral lower extremity venous duplex has been completed. Preliminary results can be found in CV Proc through chart review.  Results were given to the patient's nurse, Sian.  04/30/19 6:08 PM Carlos Levering RVT

## 2019-04-30 NOTE — Discharge Summary (Signed)
Physician Discharge Summary  [name] [name] BWG:[id number] DOB: Nov 11, 1935 DOA: 04/29/2019  PCP: [name] Pouch A, DO  Admit date: 04/29/2019 Discharge date: 04/30/2019  Admitted From: Home Disposition:  Home  Discharge Condition:Stable CODE STATUS:FULL Diet recommendation: Heart Healthy  Brief/Interim Summary:  HPI: [name] [name] is [name] 83 y.o. male with medical history significant of hypertension, hyperlipidemia, and conditions listed below being sent to the hospital from ophthalmology for evaluation of visual field changes.  Patient states earlier this morning while in the bathroom he noticed that in his right eye the lower part of the visual field turned black.  [name] few minutes later it then turned gray.  Symptoms lasted about 30 to 45 minutes and then completely resolved.  He then went outside to do some work and while coming into the house noticed that he had some gait instability, was leaning to the right.  This lasted just for [name] few minutes and then he could walk normally again.  States his wife did not notice any slurring of speech or facial droop.  There was no focal weakness or numbness.  No headaches.  Denies history of prior stroke.  States he does not smoke cigarettes.  States he used to take baby aspirin daily for heart disease prevention previously but stopped it 4 months ago.  ED Course: Blood pressure 155/74 on arrival, remainder of vitals stable.  White count 8.7, hemoglobin 13, platelet count 172,000.  Sodium 140, potassium 3.6, chloride 109, bicarb 20, BUN 35, creatinine 0.6, glucose 102.  COVID-19 test pending.  Patient was seen by neurology, recommended evaluation for stroke/TIA, evaluation for carotid stenosis or occlusion.   Hospital Course:  His hospital course remained stable.  He remained hemodynamically stable.  Neurology was following.  He underwent full stroke work-up.  MRI of the brain did not show any stroke.  CTA head and neck showed no acute vascular finding  with no large or medium vessel occlusion. This morning his vision remained normal.  He did not need PT/OT evaluation.  Echocardiogram showed normal left ventricular function with normal ejection fraction but showed possible PFO.  Venous duplex did not show any DVT. LDL was found to be 59.  Hemoglobin A1c of 5.3. Patient is hemodynamically stable for discharge to home today.  His etiology for transient loss of vision in one eye is obscure.Neurology cleared him for discharge. We recommend him to follow-up with his PCP in [name] week.  Discharge Diagnoses:  Principal Problem:   Visual field defect Active Problems:   Dyslipidemia   Essential hypertension   Chronic pain    Discharge Instructions  Discharge Instructions    Diet - low sodium heart healthy   Complete by: As directed    Discharge instructions   Complete by: As directed    1)Follow up with your PCP in [name] week.   Increase activity slowly   Complete by: As directed      Allergies as of 04/30/2019   No Known Allergies     Medication List    TAKE these medications   benazepril-hydrochlorthiazide 20-12.5 MG tablet Commonly known as: LOTENSIN HCT Take 1 tablet by mouth every morning. What changed: how much to take   celecoxib 200 MG capsule Commonly known as: CELEBREX Take 1 capsule (200 mg total) by mouth daily. What changed: when to take this   CoQ10 200 MG Caps Take 200 mg by mouth at bedtime.   fluticasone 50 MCG/ACT nasal spray Commonly known as: FLONASE Place 1 spray  into both nostrils daily. What changed:   when to take this  reasons to take this   gabapentin 600 MG tablet Commonly known as: NEURONTIN Take 1 tablet (600 mg total) by mouth 3 (three) times daily. What changed: when to take this   hydroxypropyl methylcellulose / hypromellose 2.5 % ophthalmic solution Commonly known as: ISOPTO TEARS / GONIOVISC Place 1 drop into both eyes as needed for dry eyes.   Krill Oil 1000 MG Caps Take 2,000 mg by  mouth daily with breakfast.   loratadine 10 MG tablet Commonly known as: CLARITIN Take 10 mg by mouth at bedtime.   METAMUCIL FIBER PO Take 2 capsules by mouth daily with breakfast.   NON FORMULARY Take 1 capsule by mouth See admin instructions. MedOp MaxiVision Whole Body Formula capsules: Take 1 capsule by mouth two times [name] day   NON FORMULARY Take 1 packet by mouth See admin instructions. Relief Factor: Dissolve 1 packet into preferred beverage and drink two times [name] day   NON FORMULARY Take 2 capsules by mouth See admin instructions. Clear Lungs capsules: Take 2 capsules by mouth in the morning   potassium citrate 10 MEQ (1080 MG) SR tablet Commonly known as: UROCIT-K TAKE 2 TABLETS BY MOUTH  TWICE DAILY WITH [name] MEAL What changed:   how much to take  how to take this  when to take this  additional instructions   simvastatin 40 MG tablet Commonly known as: ZOCOR Take 1 tablet (40 mg total) by mouth at bedtime.   vitamin C 500 MG tablet Commonly known as: ASCORBIC ACID Take 1,500 mg by mouth at bedtime.   Vitamin D3 125 MCG (5000 UT) Caps Take 5,000 Units by mouth at bedtime.      Follow-up Information    Kuneff, Renee A, DO. Schedule an appointment as soon as possible for [name] visit in 1 week(s).   Specialty: Family Medicine Contact information: [place] [id number] [phone]          No Known Allergies  Consultations:  Neurology   Procedures/Studies: Ct Angio Head W Or Wo Contrast  Result Date: 04/29/2019 CLINICAL DATA:  Visual disturbance beginning 0930 hours today. Inferior visual field affected. EXAM: CT ANGIOGRAPHY HEAD AND NECK TECHNIQUE: Multidetector CT imaging of the head and neck was performed using the standard protocol during bolus administration of intravenous contrast. Multiplanar CT image reconstructions and MIPs were obtained to evaluate the vascular anatomy. Carotid stenosis measurements (when applicable) are obtained  utilizing NASCET criteria, using the distal internal carotid diameter as the denominator. CONTRAST:  143mL OMNIPAQUE IOHEXOL 350 MG/ML SOLN COMPARISON:  03/12/2017 CT.  05/15/2010 MRI. FINDINGS: CT HEAD FINDINGS Brain: Mild age related volume loss. Mild chronic small-vessel ischemic change of the deep white matter. 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 No sign of acute infarction, mass lesion, hemorrhage, hydrocephalus or extra-axial collection. Vascular: There is atherosclerotic calcification of the major vessels at the base of the brain. Skull: Negative Sinuses: Clear Orbits: Normal Review of the MIP images confirms the above findings CTA NECK FINDINGS Aortic arch: Minimal atherosclerosis of the arch. No aneurysm or dissection. Branching pattern is normal without origin stenosis. Right carotid system: Common carotid artery widely patent to the bifurcation. Mild atherosclerotic calcification at the carotid bifurcation and ICA bulb but no stenosis. Cervical ICA is normal beyond that. Left carotid system: Common carotid artery is widely patent to the bifurcation. Mild atherosclerotic plaque at the ICA bulb but no stenosis. Cervical ICA is  widely patent beyond that. Vertebral arteries: Left vertebral artery origins widely patent. Right vertebral artery origin cannot be seen because of streak artifact from shoulder replacement. Both vertebral arteries are approximately equal in size an widely patent beyond that origin region through the cervical region to the foramen magnum. Skeleton: Ordinary cervical spondylosis. Other neck: No mass or lymphadenopathy. Upper chest: Normal Review of the MIP images confirms the above findings CTA HEAD FINDINGS Anterior circulation: Both internal carotid arteries are patent through the skull base and siphon regions. No siphon stenosis. The anterior and middle cerebral vessels are patent without proximal stenosis, aneurysm or vascular malformation. Posterior circulation: Both  vertebral arteries are patent at the foramen magnum. There is atherosclerotic plaque in both V4 segments with stenosis estimated at 50% on each side. Posteroinferior cerebellar arteries show flow. Both vertebral arteries reach the basilar. No basilar stenosis. Superior cerebellar and posterior cerebral arteries show flow. Venous sinuses: Patent and normal. Anatomic variants: None significant. Review of the MIP images confirms the above findings IMPRESSION: No acute vascular finding.  No large or medium vessel occlusion. Atherosclerotic disease at both carotid bifurcations but without stenosis or pronounced irregularity. 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 Vessels sufficiently patent beyond that to the basilar artery. Electronically Signed   By: Nelson Chimes M.D.   On: 04/29/2019 19:06   Ct Angio Neck W And/or Wo Contrast  Result Date: 04/29/2019 CLINICAL DATA:  Visual disturbance beginning 0930 hours today. Inferior visual field affected. EXAM: CT ANGIOGRAPHY HEAD AND NECK TECHNIQUE: Multidetector CT imaging of the head and neck was performed using the standard protocol during bolus administration of intravenous contrast. Multiplanar CT image reconstructions and MIPs were obtained to evaluate the vascular anatomy. Carotid stenosis measurements (when applicable) are obtained utilizing NASCET criteria, using the distal internal carotid diameter as the denominator. CONTRAST:  174mL OMNIPAQUE IOHEXOL 350 MG/ML SOLN COMPARISON:  03/12/2017 CT.  05/15/2010 MRI. FINDINGS: CT HEAD FINDINGS Brain: Mild age related volume loss. Mild chronic small-vessel ischemic change of the deep white matter. 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 No sign of acute infarction, mass lesion, hemorrhage, hydrocephalus or extra-axial collection. Vascular: There is atherosclerotic  calcification of the major vessels at the base of the brain. Skull: Negative Sinuses: Clear Orbits: Normal Review of the MIP images confirms the above findings CTA NECK FINDINGS Aortic arch: Minimal atherosclerosis of the arch. No aneurysm or dissection. Branching pattern is normal without origin stenosis. Right carotid system: Common carotid artery widely patent to the bifurcation. Mild atherosclerotic calcification at the carotid bifurcation and ICA bulb but no stenosis. Cervical ICA is normal beyond that. Left carotid system: Common carotid artery is widely patent to the bifurcation. Mild atherosclerotic plaque at the ICA bulb but no stenosis. Cervical ICA is widely patent beyond that. Vertebral arteries: Left vertebral artery origins widely patent. Right vertebral artery origin cannot be seen because of streak artifact from shoulder replacement. Both vertebral arteries are approximately equal in size an widely patent beyond that origin region through the cervical region to the foramen magnum. Skeleton: Ordinary cervical spondylosis. Other neck: No mass or lymphadenopathy. Upper chest: Normal Review of the MIP images confirms the above findings CTA HEAD FINDINGS Anterior circulation: Both internal carotid arteries are patent through the skull base and siphon regions. No siphon stenosis. The anterior and middle cerebral vessels are patent without proximal stenosis, aneurysm or  vascular malformation. Posterior circulation: Both vertebral arteries are patent at the foramen magnum. There is atherosclerotic plaque in both V4 segments with stenosis estimated at 50% on each side. Posteroinferior cerebellar arteries show flow. Both vertebral arteries reach the basilar. No basilar stenosis. Superior cerebellar and posterior cerebral arteries show flow. Venous sinuses: Patent and normal. Anatomic variants: None significant. Review of the MIP images confirms the above findings IMPRESSION: No acute vascular finding.  No  large or medium vessel occlusion. Atherosclerotic disease at both carotid bifurcations but without stenosis or pronounced irregularity. 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 Vessels sufficiently patent beyond that to the basilar artery. Electronically Signed   By: Nelson Chimes M.D.   On: 04/29/2019 19:06   Mr Brain Wo Contrast  Result Date: 04/29/2019 CLINICAL DATA:  83 year old male with visual disturbance beginning at 0930 hours today. Inferior visual field affected. EXAM: MRI HEAD WITHOUT CONTRAST TECHNIQUE: Multiplanar, multiecho pulse sequences of the brain and surrounding structures were obtained without intravenous contrast. COMPARISON:  CTA head and neck earlier today.  Brain MRI 05/15/2010. FINDINGS: Brain: No restricted diffusion to suggest acute infarction. No midline shift, mass effect, evidence of mass lesion, ventriculomegaly, extra-axial collection or acute intracranial hemorrhage. Cervicomedullary junction and pituitary are within normal limits. Scattered mild for age cerebral white matter T2 and FLAIR hyperintensity, only mildly progressed since 2011. No occipital cortical encephalomalacia or discrete signal changes along the visual pathways. No chronic blood products. No cortical encephalomalacia identified. There are multiple small chronic basal ganglia lacunar infarcts which are new since 2011 (series 10, image 15). Thalami, brainstem and cerebellum remain normal for age. Vascular: Major intracranial vascular flow voids are stable since 2011 with mild to moderate generalized intracranial artery tortuosity. Skull and upper cervical spine: Negative visible cervical spine. Visualized bone marrow signal is within normal limits. Sinuses/Orbits: Orbits appear stable since 2011 and negative. Mild to moderate bilateral paranasal sinus mucosal  thickening has mildly progressed. Stable chronic left maxillary sinus mucous retention cyst. Other: Mastoids remain clear. Visible internal auditory structures appear normal. Scalp and face soft tissues appear negative. IMPRESSION: 1. No acute intracranial abnormality. No explanation for acute visual disturbance. 2. Chronic small vessel disease is mild for age, and mildly progressed since 2011. Chronic intracranial artery tortuosity. 3. Mild to moderate paranasal sinus disease. Electronically Signed   By: Genevie Ann M.D.   On: 04/29/2019 23:48       Subjective:  Patient seen and examined the bedside this morning.  Remains comfortable.  Hemodynamically stable.  No focal logical deficits.  His vision is  at baseline.  Discharge Exam: Vitals:   04/30/19 0807 04/30/19 1207  BP: (!) 101/51 125/68  Pulse: (!) 57 (!) 50  Resp: 17 20  Temp: 97.6 F (36.4 C) 97.6 F (36.4 C)  SpO2: 96% 100%   Vitals:   04/30/19 0227 04/30/19 0634 04/30/19 0807 04/30/19 1207  BP: 135/60 122/66 (!) 101/51 125/68  Pulse: (!) 51 (!) 58 (!) 57 (!) 50  Resp: 18 17 17 20   Temp: 97.7 F (36.5 C) 97.7 F (36.5 C) 97.6 F (36.4 C) 97.6 F (36.4 C)  TempSrc: Oral Oral Oral Oral  SpO2: 100% 99% 96% 100%  Weight:      Height:        General: Pt is alert, awake, not in acute distress Cardiovascular: RRR, S1/S2 +, no rubs,  no gallops Respiratory: CTA bilaterally, no wheezing, no rhonchi Abdominal: Soft, NT, ND, bowel sounds + Extremities: no edema, no cyanosis    The results of significant diagnostics from this hospitalization (including imaging, microbiology, ancillary and laboratory) are listed below for reference.     Microbiology: Recent Results (from the past 240 hour(s))  SARS CORONAVIRUS 2 Nasal Swab Aptima Multi Swab     Status: None   Collection Time: 04/29/19  4:47 PM   Specimen: Aptima Multi Swab; Nasal Swab  Result Value Ref Range Status   SARS Coronavirus 2 NEGATIVE NEGATIVE Final    Comment:  (NOTE) SARS-CoV-2 target nucleic acids are NOT DETECTED. The SARS-CoV-2 RNA is generally detectable in upper and lower respiratory specimens during the acute phase of infection. Negative results do not preclude SARS-CoV-2 infection, do not rule out co-infections with other pathogens, and should not be used as the sole basis for treatment or other patient management decisions. Negative results must be combined with clinical observations, patient history, and epidemiological information. The expected result is Negative. Fact Sheet for Patients: SugarRoll.be Fact Sheet for Healthcare Providers: https://www.woods-mathews.com/ This test is not yet approved or cleared by the [place] FDA and  has been authorized for detection and/or diagnosis of SARS-CoV-2 by FDA under an Emergency Use Authorization (EUA). This EUA will remain  in effect (meaning this test can be used) for the duration of the COVID-19 declaration under Section 56 4(b)(1) of the Act, 21 U.S.C. section 360bbb-3(b)(1), unless the authorization is terminated or revoked sooner. Performed at [facility], [place] [address], [place], [place] [address]      Labs: BNP (last 3 results) No results for input(s): BNP in the last 8760 hours. Basic Metabolic Panel: Recent Labs  Lab 04/29/19 1551  NA 140  K 3.6  CL 109  CO2 20*  GLUCOSE 102*  BUN 35*  CREATININE 0.64  CALCIUM 9.4   Liver Function Tests: Recent Labs  Lab 04/29/19 1551  AST 20  ALT 19  ALKPHOS 52  BILITOT 0.8  PROT 6.8  ALBUMIN 4.1   No results for input(s): LIPASE, AMYLASE in the last 168 hours. No results for input(s): AMMONIA in the last 168 hours. CBC: Recent Labs  Lab 04/29/19 1551  WBC 8.7  NEUTROABS 6.1  HGB 13.0  HCT 38.7*  MCV 92.4  PLT 172   Cardiac Enzymes: No results for input(s): CKTOTAL, CKMB, CKMBINDEX, TROPONINI in the last 168 hours. BNP: Invalid input(s):  POCBNP CBG: Recent Labs  Lab 04/29/19 1636  GLUCAP 100*   D-Dimer No results for input(s): DDIMER in the last 72 hours. Hgb A1c Recent Labs    04/30/19 0459  HGBA1C 5.3   Lipid Profile Recent Labs    04/30/19 0459  CHOL 122  HDL 56  LDLCALC 59  TRIG 34  CHOLHDL 2.2   Thyroid function studies Recent Labs    04/30/19 0459  TSH 4.923*   Anemia work up Recent Labs    04/30/19 0459  VITAMINB12 499   Urinalysis    Component Value Date/Time   COLORURINE YELLOW 09/04/2013 1401   APPEARANCEUR CLEAR 09/04/2013 1401   LABSPEC 1.017 09/04/2013 1401   PHURINE 5.5 09/04/2013 1401   GLUCOSEU NEGATIVE 09/04/2013 1401   HGBUR NEGATIVE 09/04/2013 1401   HGBUR negative 04/17/2007 0755   BILIRUBINUR N 04/02/2017 1317   [name] [name] 09/04/2013 1401   PROTEINUR N 04/02/2017 1317   PROTEINUR NEGATIVE 09/04/2013 1401   UROBILINOGEN 0.2 04/02/2017 1317  UROBILINOGEN 1.0 09/04/2013 1401   NITRITE N 04/02/2017 1317   NITRITE NEGATIVE 09/04/2013 1401   LEUKOCYTESUR Negative 04/02/2017 1317   Sepsis Labs Invalid input(s): PROCALCITONIN,  WBC,  LACTICIDVEN Microbiology Recent Results (from the past 240 hour(s))  SARS CORONAVIRUS 2 Nasal Swab Aptima Multi Swab     Status: None   Collection Time: 04/29/19  4:47 PM   Specimen: Aptima Multi Swab; Nasal Swab  Result Value Ref Range Status   SARS Coronavirus 2 NEGATIVE NEGATIVE Final    Comment: (NOTE) SARS-CoV-2 target nucleic acids are NOT DETECTED. The SARS-CoV-2 RNA is generally detectable in upper and lower respiratory specimens during the acute phase of infection. Negative results do not preclude SARS-CoV-2 infection, do not rule out co-infections with other pathogens, and should not be used as the sole basis for treatment or other patient management decisions. Negative results must be combined with clinical observations, patient history, and epidemiological information. The expected result is Negative. Fact Sheet  for Patients: SugarRoll.be Fact Sheet for Healthcare Providers: https://www.woods-mathews.com/ This test is not yet approved or cleared by the [place] FDA and  has been authorized for detection and/or diagnosis of SARS-CoV-2 by FDA under an Emergency Use Authorization (EUA). This EUA will remain  in effect (meaning this test can be used) for the duration of the COVID-19 declaration under Section 56 4(b)(1) of the Act, 21 U.S.C. section 360bbb-3(b)(1), unless the authorization is terminated or revoked sooner. Performed at [facility], [place] [address] [place], [place] [address]     Please note: You were cared for by [name] hospitalist during your hospital stay. Once you are discharged, your primary care physician will handle any further medical issues. Please note that NO REFILLS for any discharge medications will be authorized once you are discharged, as it is imperative that you return to your primary care physician (or establish [name] relationship with [name] primary care physician if you do not have one) for your post hospital discharge needs so that they can reassess your need for medications and monitor your lab values.    Time coordinating discharge: 40 minutes  SIGNED:   Shelly Coss, MD  [facility] 04/30/2019, 2:18 PM Pager [phone]  If 7PM-7AM, please contact night-coverage [email or web address] Password [email or web address]

## 2019-04-30 NOTE — Plan of Care (Signed)
Problem: Education: Goal: Knowledge of General Education information will improve Description: Including pain rating scale, medication(s)/side effects and non-pharmacologic comfort measures 04/30/2019 1634 by Myriam Forehand, RN Outcome: Adequate for Discharge 04/30/2019 1632 by Myriam Forehand, RN Outcome: Adequate for Discharge 04/30/2019 1632 by Myriam Forehand, RN Outcome: Adequate for Discharge   Problem: Health Behavior/Discharge Planning: Goal: Ability to manage health-related needs will improve 04/30/2019 1634 by Myriam Forehand, RN Outcome: Adequate for Discharge 04/30/2019 1632 by Myriam Forehand, RN Outcome: Adequate for Discharge 04/30/2019 1632 by Myriam Forehand, RN Outcome: Adequate for Discharge   Problem: Clinical Measurements: Goal: Ability to maintain clinical measurements within normal limits will improve 04/30/2019 1634 by Myriam Forehand, RN Outcome: Adequate for Discharge 04/30/2019 1632 by Myriam Forehand, RN Outcome: Adequate for Discharge 04/30/2019 1632 by Myriam Forehand, RN Outcome: Adequate for Discharge   Problem: Activity: Goal: Risk for activity intolerance will decrease 04/30/2019 1634 by Myriam Forehand, RN Outcome: Adequate for Discharge 04/30/2019 1632 by Myriam Forehand, RN Outcome: Adequate for Discharge 04/30/2019 1632 by Myriam Forehand, RN Outcome: Adequate for Discharge   Problem: Nutrition: Goal: Adequate nutrition will be maintained 04/30/2019 1634 by Myriam Forehand, RN Outcome: Adequate for Discharge 04/30/2019 1632 by Myriam Forehand, RN Outcome: Adequate for Discharge 04/30/2019 1632 by Myriam Forehand, RN Outcome: Adequate for Discharge   Problem: Elimination: Goal: Will not experience complications related to bowel motility 04/30/2019 1634 by Myriam Forehand, RN Outcome: Adequate for Discharge 04/30/2019 1632 by Myriam Forehand, RN Outcome: Adequate for Discharge 04/30/2019 1632 by Myriam Forehand, RN Outcome: Adequate for Discharge   Problem: Pain  Managment: Goal: General experience of comfort will improve 04/30/2019 1634 by Myriam Forehand, RN Outcome: Adequate for Discharge 04/30/2019 1632 by Myriam Forehand, RN Outcome: Adequate for Discharge 04/30/2019 1632 by Myriam Forehand, RN Outcome: Adequate for Discharge   Problem: Safety: Goal: Ability to remain free from injury will improve 04/30/2019 1634 by Myriam Forehand, RN Outcome: Adequate for Discharge 04/30/2019 1632 by Myriam Forehand, RN Outcome: Adequate for Discharge 04/30/2019 1632 by Myriam Forehand, RN Outcome: Adequate for Discharge   Problem: Skin Integrity: Goal: Risk for impaired skin integrity will decrease 04/30/2019 1634 by Myriam Forehand, RN Outcome: Adequate for Discharge 04/30/2019 1632 by Myriam Forehand, RN Outcome: Adequate for Discharge 04/30/2019 1632 by Myriam Forehand, RN Outcome: Adequate for Discharge   Problem: Education: Goal: Knowledge of disease or condition will improve 04/30/2019 1634 by Myriam Forehand, RN Outcome: Adequate for Discharge 04/30/2019 1632 by Myriam Forehand, RN Outcome: Adequate for Discharge 04/30/2019 1632 by Myriam Forehand, RN Outcome: Adequate for Discharge Goal: Knowledge of secondary prevention will improve 04/30/2019 1634 by Myriam Forehand, RN Outcome: Adequate for Discharge 04/30/2019 1632 by Myriam Forehand, RN Outcome: Adequate for Discharge 04/30/2019 1632 by Myriam Forehand, RN Outcome: Adequate for Discharge Goal: Knowledge of patient specific risk factors addressed and post discharge goals established will improve 04/30/2019 1634 by Myriam Forehand, RN Outcome: Adequate for Discharge 04/30/2019 1632 by Myriam Forehand, RN Outcome: Adequate for Discharge 04/30/2019 1632 by Myriam Forehand, RN Outcome: Adequate for Discharge   Problem: Coping: Goal: Will verbalize positive feelings about self 04/30/2019 1634 by Myriam Forehand, RN Outcome: Adequate for Discharge 04/30/2019 1632 by Myriam Forehand, RN Outcome: Adequate for Discharge 04/30/2019 1632  by Myriam Forehand, RN Outcome: Adequate for Discharge   Problem: Health Behavior/Discharge Planning: Goal: Ability to manage health-related needs will improve 04/30/2019 1634 by Myriam Forehand, RN Outcome: Adequate for Discharge 04/30/2019 1632 by Myriam Forehand, RN Outcome: Adequate for Discharge 04/30/2019 1632 by Myriam Forehand, RN  Outcome: Adequate for Discharge   Problem: Self-Care: Goal: Ability to participate in self-care as condition permits will improve 04/30/2019 1634 by Myriam Forehand, RN Outcome: Adequate for Discharge 04/30/2019 1632 by Myriam Forehand, RN Outcome: Adequate for Discharge 04/30/2019 1632 by Myriam Forehand, RN Outcome: Adequate for Discharge   Problem: Nutrition: Goal: Dietary intake will improve 04/30/2019 1634 by Myriam Forehand, RN Outcome: Adequate for Discharge 04/30/2019 1632 by Myriam Forehand, RN Outcome: Adequate for Discharge 04/30/2019 1632 by Myriam Forehand, RN Outcome: Adequate for Discharge   Problem: Ischemic Stroke/TIA Tissue Perfusion: Goal: Complications of ischemic stroke/TIA will be minimized 04/30/2019 1634 by Myriam Forehand, RN Outcome: Adequate for Discharge 04/30/2019 1632 by Myriam Forehand, RN Outcome: Adequate for Discharge 04/30/2019 1632 by Myriam Forehand, RN Outcome: Adequate for Discharge

## 2019-04-30 NOTE — Progress Notes (Signed)
STROKE TEAM PROGRESS NOTE   INTERVAL HISTORY Pt lying in bed. Wife is at bedside.  Patient recounted HPI with me.  He stated that for the last several years he had some episode of dizziness, feeling of falling to the right, but able to hold himself without a fall, lasting 2 to 3 minutes.  Yesterday, he was in bathroom straining, suddenly had right eye lower visual field black gradually returns to gray and recovered, lasting 35 to 40 minutes.  He went to see his optometrist, he examined him and sent him to [facility] for concern for amaurosis fugax.  Vitals:   04/30/19 0227 04/30/19 0634 04/30/19 0807 04/30/19 1207  BP: 135/60 122/66 (!) 101/51 125/68  Pulse: (!) 51 (!) 58 (!) 57 (!) 50  Resp: '18 17 17 20  ' Temp: 97.7 F (36.5 C) 97.7 F (36.5 C) 97.6 F (36.4 C) 97.6 F (36.4 C)  TempSrc: Oral Oral Oral Oral  SpO2: 100% 99% 96% 100%  Weight:      Height:        CBC:  Recent Labs  Lab 04/29/19 1551  WBC 8.7  NEUTROABS 6.1  HGB 13.0  HCT 38.7*  MCV 92.4  PLT 563    Basic Metabolic Panel:  Recent Labs  Lab 04/29/19 1551  NA 140  K 3.6  CL 109  CO2 20*  GLUCOSE 102*  BUN 35*  CREATININE 0.64  CALCIUM 9.4   Lipid Panel:     Component Value Date/Time   CHOL 122 04/30/2019 0459   TRIG 34 04/30/2019 0459   HDL 56 04/30/2019 0459   CHOLHDL 2.2 04/30/2019 0459   VLDL 7 04/30/2019 0459   LDLCALC 59 04/30/2019 0459   HgbA1c:  Lab Results  Component Value Date   HGBA1C 5.3 04/30/2019   Urine Drug Screen: No results found for: LABOPIA, COCAINSCRNUR, LABBENZ, AMPHETMU, THCU, LABBARB  Alcohol Level No results found for: ETH  IMAGING Ct Angio Head W Or Wo Contrast  Result Date: 04/29/2019 CLINICAL DATA:  Visual disturbance beginning 0930 hours today. Inferior visual field affected. EXAM: CT ANGIOGRAPHY HEAD AND NECK TECHNIQUE: Multidetector CT imaging of the head and neck was performed using the standard protocol during bolus administration of intravenous contrast.  Multiplanar CT image reconstructions and MIPs were obtained to evaluate the vascular anatomy. Carotid stenosis measurements (when applicable) are obtained utilizing NASCET criteria, using the distal internal carotid diameter as the denominator. CONTRAST:  181m OMNIPAQUE IOHEXOL 350 MG/ML SOLN COMPARISON:  03/12/2017 CT.  05/15/2010 MRI. FINDINGS: CT HEAD FINDINGS Brain: Mild age related volume loss. Mild chronic small-vessel ischemic change of the deep white matter. 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 No sign of acute infarction, mass lesion, hemorrhage, hydrocephalus or extra-axial collection. Vascular: There is atherosclerotic calcification of the major vessels at the base of the brain. Skull: Negative Sinuses: Clear Orbits: Normal Review of the MIP images confirms the above findings CTA NECK FINDINGS Aortic arch: Minimal atherosclerosis of the arch. No aneurysm or dissection. Branching pattern is normal without origin stenosis. Right carotid system: Common carotid artery widely patent to the bifurcation. Mild atherosclerotic calcification at the carotid bifurcation and ICA bulb but no stenosis. Cervical ICA is normal beyond that. Left carotid system: Common carotid artery is widely patent to the bifurcation. Mild atherosclerotic plaque at the ICA bulb but no stenosis. Cervical ICA is widely patent beyond that. Vertebral arteries: Left vertebral artery origins widely patent. Right vertebral artery origin cannot be seen because of streak artifact from  shoulder replacement. Both vertebral arteries are approximately equal in size an widely patent beyond that origin region through the cervical region to the foramen magnum. Skeleton: Ordinary cervical spondylosis. Other neck: No mass or lymphadenopathy. Upper chest: Normal Review of the MIP images confirms the above findings CTA HEAD FINDINGS Anterior circulation: Both internal carotid arteries are patent through the skull base and siphon regions. No siphon  stenosis. The anterior and middle cerebral vessels are patent without proximal stenosis, aneurysm or vascular malformation. Posterior circulation: Both vertebral arteries are patent at the foramen magnum. There is atherosclerotic plaque in both V4 segments with stenosis estimated at 50% on each side. Posteroinferior cerebellar arteries show flow. Both vertebral arteries reach the basilar. No basilar stenosis. Superior cerebellar and posterior cerebral arteries show flow. Venous sinuses: Patent and normal. Anatomic variants: None significant. Review of the MIP images confirms the above findings IMPRESSION: No acute vascular finding.  No large or medium vessel occlusion. Atherosclerotic disease at both carotid bifurcations but without stenosis or pronounced irregularity. 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 Vessels sufficiently patent beyond that to the basilar artery. Electronically Signed   By: Nelson Chimes M.D.   On: 04/29/2019 19:06   Ct Angio Neck W And/or Wo Contrast  Result Date: 04/29/2019 CLINICAL DATA:  Visual disturbance beginning 0930 hours today. Inferior visual field affected. EXAM: CT ANGIOGRAPHY HEAD AND NECK TECHNIQUE: Multidetector CT imaging of the head and neck was performed using the standard protocol during bolus administration of intravenous contrast. Multiplanar CT image reconstructions and MIPs were obtained to evaluate the vascular anatomy. Carotid stenosis measurements (when applicable) are obtained utilizing NASCET criteria, using the distal internal carotid diameter as the denominator. CONTRAST:  159m OMNIPAQUE IOHEXOL 350 MG/ML SOLN COMPARISON:  03/12/2017 CT.  05/15/2010 MRI. FINDINGS: CT HEAD FINDINGS Brain: Mild age related volume loss. Mild chronic small-vessel ischemic change of the deep white matter. Old lacunar infarction  right caudate head. No sign of acute infarction, mass lesion, hemorrhage, hydrocephalus or extra-axial collection. Vascular: There is atherosclerotic calcification of the major vessels at the base of the brain. Skull: Negative Sinuses: Clear Orbits: Normal Review of the MIP images confirms the above findings CTA NECK FINDINGS Aortic arch: Minimal atherosclerosis of the arch. No aneurysm or dissection. Branching pattern is normal without origin stenosis. Right carotid system: Common carotid artery widely patent to the bifurcation. Mild atherosclerotic calcification at the carotid bifurcation and ICA bulb but no stenosis. Cervical ICA is normal beyond that. Left carotid system: Common carotid artery is widely patent to the bifurcation. Mild atherosclerotic plaque at the ICA bulb but no stenosis. Cervical ICA is widely patent beyond that. Vertebral arteries: Left vertebral artery origins widely patent. Right vertebral artery origin cannot be seen because of streak artifact from shoulder replacement. Both vertebral arteries are approximately equal in size an widely patent beyond that origin region through the cervical region to the foramen magnum. Skeleton: Ordinary cervical spondylosis. Other neck: No mass or lymphadenopathy. Upper chest: Normal Review of the MIP images confirms the above findings CTA HEAD FINDINGS Anterior circulation: Both internal carotid arteries are patent through the skull base and siphon regions. No siphon stenosis. The anterior and middle cerebral vessels are patent without proximal stenosis, aneurysm or vascular malformation. Posterior circulation: Both vertebral arteries are patent at the foramen magnum. There is atherosclerotic plaque in both V4 segments with stenosis estimated  at 50% on each side. Posteroinferior cerebellar arteries show flow. Both vertebral arteries reach the basilar. No basilar stenosis. Superior cerebellar and posterior cerebral arteries show flow. Venous sinuses: Patent  and normal. Anatomic variants: None significant. Review of the MIP images confirms the above findings IMPRESSION: No acute vascular finding.  No large or medium vessel occlusion. Atherosclerotic disease at both carotid bifurcations but without stenosis or pronounced irregularity. 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 Vessels sufficiently patent beyond that to the basilar artery. Electronically Signed   By: Nelson Chimes M.D.   On: 04/29/2019 19:06   Mr Brain Wo Contrast  Result Date: 04/29/2019 CLINICAL DATA:  83 year old male with visual disturbance beginning at 0930 hours today. Inferior visual field affected. EXAM: MRI HEAD WITHOUT CONTRAST TECHNIQUE: Multiplanar, multiecho pulse sequences of the brain and surrounding structures were obtained without intravenous contrast. COMPARISON:  CTA head and neck earlier today.  Brain MRI 05/15/2010. FINDINGS: Brain: No restricted diffusion to suggest acute infarction. No midline shift, mass effect, evidence of mass lesion, ventriculomegaly, extra-axial collection or acute intracranial hemorrhage. Cervicomedullary junction and pituitary are within normal limits. Scattered mild for age cerebral white matter T2 and FLAIR hyperintensity, only mildly progressed since 2011. No occipital cortical encephalomalacia or discrete signal changes along the visual pathways. No chronic blood products. No cortical encephalomalacia identified. There are multiple small chronic basal ganglia lacunar infarcts which are new since 2011 (series 10, image 15). Thalami, brainstem and cerebellum remain normal for age. Vascular: Major intracranial vascular flow voids are stable since 2011 with mild to moderate generalized intracranial artery tortuosity. Skull and upper cervical spine: Negative visible cervical spine. Visualized bone marrow  signal is within normal limits. Sinuses/Orbits: Orbits appear stable since 2011 and negative. Mild to moderate bilateral paranasal sinus mucosal thickening has mildly progressed. Stable chronic left maxillary sinus mucous retention cyst. Other: Mastoids remain clear. Visible internal auditory structures appear normal. Scalp and face soft tissues appear negative. IMPRESSION: 1. No acute intracranial abnormality. No explanation for acute visual disturbance. 2. Chronic small vessel disease is mild for age, and mildly progressed since 2011. Chronic intracranial artery tortuosity. 3. Mild to moderate paranasal sinus disease. Electronically Signed   By: Genevie Ann M.D.   On: 04/29/2019 23:48   2D Echocardiogram  1. The left ventricle has normal systolic function with an ejection fraction of 60-65%. The cavity size was normal. Left ventricular diastolic Doppler parameters are consistent with pseudonormalization.  2. The right ventricle has normal systolic function. The cavity was normal. There is no increase in right ventricular wall thickness. Right ventricular systolic pressure is normal with an estimated pressure of 27.6 mmHg.  3. Left atrial size was mild-moderately dilated.  4. The aortic valve has an indeterminate number of cusps but probably tricuspid. The valve is moderately thickened and severely calcificed with mildly restricted motion. There is mild aortic stenosis. There is mild AI. The planimetered AVA was 1.43m.  5. The aorta is normal unless otherwise noted.  6. Suspicious for PFO by colorflow doppler. Recommend agitated saline contrast injection.  PHYSICAL EXAM  Temp:  [97.6 F (36.4 C)-97.9 F (36.6 C)] 97.9 F (36.6 C) (08/14 1614) Pulse Rate:  [50-72] 57 (08/14 1614) Resp:  [13-20] 20 (08/14 1614) BP: (101-161)/(51-85) 138/64 (08/14 1614) SpO2:  [92 %-100 %] 100 % (08/14 1614) Weight:  [82 kg] 82 kg (08/14 0009)  General - Well nourished, well developed, in no apparent  distress.  Ophthalmologic - fundi not visualized due to noncooperation.  Cardiovascular - Regular rate and rhythm.  Mental Status -  Level of arousal and orientation to time, place, and person were intact. Language including expression, naming, repetition, comprehension was assessed and found intact. Fund of Knowledge was assessed and was intact.  Cranial Nerves II - XII - II - Visual field intact OU. III, IV, VI - Extraocular movements intact. V - Facial sensation intact bilaterally. VII - Facial movement intact bilaterally. VIII - Hearing & vestibular intact bilaterally. X - Palate elevates symmetrically. XI - Chin turning & shoulder shrug intact bilaterally. XII - Tongue protrusion intact.  Motor Strength - The patient's strength was normal in all extremities and pronator drift was absent.  Bulk was normal and fasciculations were absent.   Motor Tone - Muscle tone was assessed at the neck and appendages and was normal.  Reflexes - The patient's reflexes were symmetrical in all extremities and he had no pathological reflexes.  Sensory - Light touch, temperature/pinprick were assessed and were symmetrical.    Coordination - The patient had normal movements in the hands with no ataxia or dysmetria.  Tremor was absent.  Gait and Station - deferred.   ASSESSMENT/PLAN [name] [name] is a 83 y.o. male with history of HTN, HLD presenting from optometrist with transient recurrent RLQ vision loss OD with noted imbalance by wife and ? slurred speech per optometrist.   Likely transient vision loss due to temporary elevated ICP compressing on optic nerve in the setting of straining   CTA head & neck no acute finding. B ICA atherosclerosis. B V4 atherosclerosis w/ 50% B stenoses.  MRI  No acute abnormality.   2D Echo EF 60-65%. LA mod dilated. Suspicious for PFO. No source of embolus  LE venous doppler no DVT   LDL 59  HgbA1c 5.3  CRP & ESR normal  Lovenox 40 mg sq  daily for VTE prophylaxis  No antithrombotic prior to admission, now on aspirin 81 mg daily. Continue on dc  Therapy recommendations:  No PT, no OT  Disposition:  Return home  Hypertension  Stable . BP goal normotensive  Hyperlipidemia  Home meds:  zocor 40  LDL 59, goal < 70  Continue home statin at discharge  Other Stroke Risk Factors  Advanced age  Family hx stroke (father)  Other Active Problems  Chronic pain on cerebrex and gabapentin  Presyncope ? - educated pt on slow getting up or standing up  Hospital day # 0  Neurology will sign off. Please call with questions. No neuro follow up needed at this time. Thanks for the consult.   Rosalin Hawking, MD PhD Stroke Neurology 04/30/2019 5:51 PM  To contact Stroke Continuity provider, please refer to [email or web address] After hours, contact General Neurology

## 2019-04-30 NOTE — Progress Notes (Signed)
  Echocardiogram 2D Echocardiogram has been performed.  [name] [name] [name] [name] 04/30/2019, 3:30 PM

## 2019-04-30 NOTE — Plan of Care (Signed)
Problem: Education: Goal: Knowledge of General Education information will improve Description: Including pain rating scale, medication(s)/side effects and non-pharmacologic comfort measures 04/30/2019 1634 by Myriam Forehand, RN Outcome: Adequate for Discharge 04/30/2019 1634 by Myriam Forehand, RN Outcome: Adequate for Discharge 04/30/2019 1632 by Myriam Forehand, RN Outcome: Adequate for Discharge 04/30/2019 1632 by Myriam Forehand, RN Outcome: Adequate for Discharge   Problem: Health Behavior/Discharge Planning: Goal: Ability to manage health-related needs will improve 04/30/2019 1634 by Myriam Forehand, RN Outcome: Adequate for Discharge 04/30/2019 1634 by Myriam Forehand, RN Outcome: Adequate for Discharge 04/30/2019 1632 by Myriam Forehand, RN Outcome: Adequate for Discharge 04/30/2019 1632 by Myriam Forehand, RN Outcome: Adequate for Discharge   Problem: Clinical Measurements: Goal: Ability to maintain clinical measurements within normal limits will improve 04/30/2019 1634 by Myriam Forehand, RN Outcome: Adequate for Discharge 04/30/2019 1634 by Myriam Forehand, RN Outcome: Adequate for Discharge 04/30/2019 1632 by Myriam Forehand, RN Outcome: Adequate for Discharge 04/30/2019 1632 by Myriam Forehand, RN Outcome: Adequate for Discharge   Problem: Activity: Goal: Risk for activity intolerance will decrease 04/30/2019 1634 by Myriam Forehand, RN Outcome: Adequate for Discharge 04/30/2019 1634 by Myriam Forehand, RN Outcome: Adequate for Discharge 04/30/2019 1632 by Myriam Forehand, RN Outcome: Adequate for Discharge 04/30/2019 1632 by Myriam Forehand, RN Outcome: Adequate for Discharge   Problem: Nutrition: Goal: Adequate nutrition will be maintained 04/30/2019 1634 by Myriam Forehand, RN Outcome: Adequate for Discharge 04/30/2019 1634 by Myriam Forehand, RN Outcome: Adequate for Discharge 04/30/2019 1632 by Myriam Forehand, RN Outcome: Adequate for Discharge 04/30/2019 1632 by Myriam Forehand, RN Outcome: Adequate  for Discharge   Problem: Elimination: Goal: Will not experience complications related to bowel motility 04/30/2019 1634 by Myriam Forehand, RN Outcome: Adequate for Discharge 04/30/2019 1634 by Myriam Forehand, RN Outcome: Adequate for Discharge 04/30/2019 1632 by Myriam Forehand, RN Outcome: Adequate for Discharge 04/30/2019 1632 by Myriam Forehand, RN Outcome: Adequate for Discharge   Problem: Pain Managment: Goal: General experience of comfort will improve 04/30/2019 1634 by Myriam Forehand, RN Outcome: Adequate for Discharge 04/30/2019 1634 by Myriam Forehand, RN Outcome: Adequate for Discharge 04/30/2019 1632 by Myriam Forehand, RN Outcome: Adequate for Discharge 04/30/2019 1632 by Myriam Forehand, RN Outcome: Adequate for Discharge   Problem: Safety: Goal: Ability to remain free from injury will improve 04/30/2019 1634 by Myriam Forehand, RN Outcome: Adequate for Discharge 04/30/2019 1634 by Myriam Forehand, RN Outcome: Adequate for Discharge 04/30/2019 1632 by Myriam Forehand, RN Outcome: Adequate for Discharge 04/30/2019 1632 by Myriam Forehand, RN Outcome: Adequate for Discharge   Problem: Skin Integrity: Goal: Risk for impaired skin integrity will decrease 04/30/2019 1634 by Myriam Forehand, RN Outcome: Adequate for Discharge 04/30/2019 1634 by Myriam Forehand, RN Outcome: Adequate for Discharge 04/30/2019 1632 by Myriam Forehand, RN Outcome: Adequate for Discharge 04/30/2019 1632 by Myriam Forehand, RN Outcome: Adequate for Discharge   Problem: Education: Goal: Knowledge of disease or condition will improve 04/30/2019 1634 by Myriam Forehand, RN Outcome: Adequate for Discharge 04/30/2019 1634 by Myriam Forehand, RN Outcome: Adequate for Discharge 04/30/2019 1632 by Myriam Forehand, RN Outcome: Adequate for Discharge 04/30/2019 1632 by Myriam Forehand, RN Outcome: Adequate for Discharge Goal: Knowledge of secondary prevention will improve 04/30/2019 1634 by Myriam Forehand, RN Outcome: Adequate for  Discharge 04/30/2019 1634 by Myriam Forehand, RN Outcome: Adequate for Discharge 04/30/2019 1632 by Myriam Forehand, RN Outcome: Adequate for Discharge 04/30/2019 1632 by Myriam Forehand, RN Outcome: Adequate for Discharge Goal: Knowledge of patient specific risk factors addressed and post discharge goals established will improve 04/30/2019  1634 by Myriam Forehand, RN Outcome: Adequate for Discharge 04/30/2019 1634 by Myriam Forehand, RN Outcome: Adequate for Discharge 04/30/2019 1632 by Myriam Forehand, RN Outcome: Adequate for Discharge 04/30/2019 1632 by Myriam Forehand, RN Outcome: Adequate for Discharge   Problem: Coping: Goal: Will verbalize positive feelings about self 04/30/2019 1634 by Myriam Forehand, RN Outcome: Adequate for Discharge 04/30/2019 1634 by Myriam Forehand, RN Outcome: Adequate for Discharge 04/30/2019 1632 by Myriam Forehand, RN Outcome: Adequate for Discharge 04/30/2019 1632 by Myriam Forehand, RN Outcome: Adequate for Discharge   Problem: Health Behavior/Discharge Planning: Goal: Ability to manage health-related needs will improve 04/30/2019 1634 by Myriam Forehand, RN Outcome: Adequate for Discharge 04/30/2019 1634 by Myriam Forehand, RN Outcome: Adequate for Discharge 04/30/2019 1632 by Myriam Forehand, RN Outcome: Adequate for Discharge 04/30/2019 1632 by Myriam Forehand, RN Outcome: Adequate for Discharge   Problem: Self-Care: Goal: Ability to participate in self-care as condition permits will improve 04/30/2019 1634 by Myriam Forehand, RN Outcome: Adequate for Discharge 04/30/2019 1634 by Myriam Forehand, RN Outcome: Adequate for Discharge 04/30/2019 1632 by Myriam Forehand, RN Outcome: Adequate for Discharge 04/30/2019 1632 by Myriam Forehand, RN Outcome: Adequate for Discharge   Problem: Nutrition: Goal: Dietary intake will improve 04/30/2019 1634 by Myriam Forehand, RN Outcome: Adequate for Discharge 04/30/2019 1634 by Myriam Forehand, RN Outcome: Adequate for Discharge 04/30/2019 1632 by  Myriam Forehand, RN Outcome: Adequate for Discharge 04/30/2019 1632 by Myriam Forehand, RN Outcome: Adequate for Discharge   Problem: Ischemic Stroke/TIA Tissue Perfusion: Goal: Complications of ischemic stroke/TIA will be minimized 04/30/2019 1634 by Myriam Forehand, RN Outcome: Adequate for Discharge 04/30/2019 1634 by Myriam Forehand, RN Outcome: Adequate for Discharge 04/30/2019 1632 by Myriam Forehand, RN Outcome: Adequate for Discharge 04/30/2019 1632 by Myriam Forehand, RN Outcome: Adequate for Discharge

## 2019-04-30 NOTE — Plan of Care (Signed)
  Problem: Education: Goal: Knowledge of General Education information will improve Description: Including pain rating scale, medication(s)/side effects and non-pharmacologic comfort measures Outcome: Adequate for Discharge   Problem: Health Behavior/Discharge Planning: Goal: Ability to manage health-related needs will improve Outcome: Adequate for Discharge   Problem: Clinical Measurements: Goal: Ability to maintain clinical measurements within normal limits will improve Outcome: Adequate for Discharge   Problem: Activity: Goal: Risk for activity intolerance will decrease Outcome: Adequate for Discharge   Problem: Nutrition: Goal: Adequate nutrition will be maintained Outcome: Adequate for Discharge   Problem: Elimination: Goal: Will not experience complications related to bowel motility Outcome: Adequate for Discharge   Problem: Pain Managment: Goal: General experience of comfort will improve Outcome: Adequate for Discharge   Problem: Safety: Goal: Ability to remain free from injury will improve Outcome: Adequate for Discharge   Problem: Skin Integrity: Goal: Risk for impaired skin integrity will decrease Outcome: Adequate for Discharge   Problem: Education: Goal: Knowledge of disease or condition will improve Outcome: Adequate for Discharge Goal: Knowledge of secondary prevention will improve Outcome: Adequate for Discharge Goal: Knowledge of patient specific risk factors addressed and post discharge goals established will improve Outcome: Adequate for Discharge   Problem: Coping: Goal: Will verbalize positive feelings about self Outcome: Adequate for Discharge   Problem: Health Behavior/Discharge Planning: Goal: Ability to manage health-related needs will improve Outcome: Adequate for Discharge   Problem: Self-Care: Goal: Ability to participate in self-care as condition permits will improve Outcome: Adequate for Discharge   Problem: Nutrition: Goal:  Dietary intake will improve Outcome: Adequate for Discharge   Problem: Ischemic Stroke/TIA Tissue Perfusion: Goal: Complications of ischemic stroke/TIA will be minimized Outcome: Adequate for Discharge

## 2019-04-30 NOTE — Plan of Care (Signed)
  Problem: Education: Goal: Knowledge of General Education information will improve Description: Including pain rating scale, medication(s)/side effects and non-pharmacologic comfort measures 04/30/2019 1632 by Myriam Forehand, RN Outcome: Adequate for Discharge 04/30/2019 1632 by Myriam Forehand, RN Outcome: Adequate for Discharge   Problem: Health Behavior/Discharge Planning: Goal: Ability to manage health-related needs will improve 04/30/2019 1632 by Myriam Forehand, RN Outcome: Adequate for Discharge 04/30/2019 1632 by Myriam Forehand, RN Outcome: Adequate for Discharge   Problem: Clinical Measurements: Goal: Ability to maintain clinical measurements within normal limits will improve 04/30/2019 1632 by Myriam Forehand, RN Outcome: Adequate for Discharge 04/30/2019 1632 by Myriam Forehand, RN Outcome: Adequate for Discharge   Problem: Activity: Goal: Risk for activity intolerance will decrease 04/30/2019 1632 by Myriam Forehand, RN Outcome: Adequate for Discharge 04/30/2019 1632 by Myriam Forehand, RN Outcome: Adequate for Discharge   Problem: Nutrition: Goal: Adequate nutrition will be maintained 04/30/2019 1632 by Myriam Forehand, RN Outcome: Adequate for Discharge 04/30/2019 1632 by Myriam Forehand, RN Outcome: Adequate for Discharge   Problem: Elimination: Goal: Will not experience complications related to bowel motility 04/30/2019 1632 by Myriam Forehand, RN Outcome: Adequate for Discharge 04/30/2019 1632 by Myriam Forehand, RN Outcome: Adequate for Discharge   Problem: Pain Managment: Goal: General experience of comfort will improve 04/30/2019 1632 by Myriam Forehand, RN Outcome: Adequate for Discharge 04/30/2019 1632 by Myriam Forehand, RN Outcome: Adequate for Discharge   Problem: Safety: Goal: Ability to remain free from injury will improve 04/30/2019 1632 by Myriam Forehand, RN Outcome: Adequate for Discharge 04/30/2019 1632 by Myriam Forehand, RN Outcome: Adequate for Discharge   Problem: Skin  Integrity: Goal: Risk for impaired skin integrity will decrease 04/30/2019 1632 by Myriam Forehand, RN Outcome: Adequate for Discharge 04/30/2019 1632 by Myriam Forehand, RN Outcome: Adequate for Discharge   Problem: Education: Goal: Knowledge of disease or condition will improve 04/30/2019 1632 by Myriam Forehand, RN Outcome: Adequate for Discharge 04/30/2019 1632 by Myriam Forehand, RN Outcome: Adequate for Discharge Goal: Knowledge of secondary prevention will improve 04/30/2019 1632 by Myriam Forehand, RN Outcome: Adequate for Discharge 04/30/2019 1632 by Myriam Forehand, RN Outcome: Adequate for Discharge Goal: Knowledge of patient specific risk factors addressed and post discharge goals established will improve 04/30/2019 1632 by Myriam Forehand, RN Outcome: Adequate for Discharge 04/30/2019 1632 by Myriam Forehand, RN Outcome: Adequate for Discharge   Problem: Coping: Goal: Will verbalize positive feelings about self 04/30/2019 1632 by Myriam Forehand, RN Outcome: Adequate for Discharge 04/30/2019 1632 by Myriam Forehand, RN Outcome: Adequate for Discharge   Problem: Health Behavior/Discharge Planning: Goal: Ability to manage health-related needs will improve 04/30/2019 1632 by Myriam Forehand, RN Outcome: Adequate for Discharge 04/30/2019 1632 by Myriam Forehand, RN Outcome: Adequate for Discharge   Problem: Self-Care: Goal: Ability to participate in self-care as condition permits will improve 04/30/2019 1632 by Myriam Forehand, RN Outcome: Adequate for Discharge 04/30/2019 1632 by Myriam Forehand, RN Outcome: Adequate for Discharge   Problem: Nutrition: Goal: Dietary intake will improve 04/30/2019 1632 by Myriam Forehand, RN Outcome: Adequate for Discharge 04/30/2019 1632 by Myriam Forehand, RN Outcome: Adequate for Discharge   Problem: Ischemic Stroke/TIA Tissue Perfusion: Goal: Complications of ischemic stroke/TIA will be minimized 04/30/2019 1632 by Myriam Forehand, RN Outcome: Adequate for  Discharge 04/30/2019 1632 by Myriam Forehand, RN Outcome: Adequate for Discharge

## 2019-04-30 NOTE — Plan of Care (Signed)
Problem: Education: Goal: Knowledge of General Education information will improve Description: Including pain rating scale, medication(s)/side effects and non-pharmacologic comfort measures 04/30/2019 1635 by Myriam Forehand, RN Outcome: Adequate for Discharge 04/30/2019 1634 by Myriam Forehand, RN Outcome: Adequate for Discharge 04/30/2019 1634 by Myriam Forehand, RN Outcome: Adequate for Discharge 04/30/2019 1632 by Myriam Forehand, RN Outcome: Adequate for Discharge 04/30/2019 1632 by Myriam Forehand, RN Outcome: Adequate for Discharge   Problem: Health Behavior/Discharge Planning: Goal: Ability to manage health-related needs will improve 04/30/2019 1635 by Myriam Forehand, RN Outcome: Adequate for Discharge 04/30/2019 1634 by Myriam Forehand, RN Outcome: Adequate for Discharge 04/30/2019 1634 by Myriam Forehand, RN Outcome: Adequate for Discharge 04/30/2019 1632 by Myriam Forehand, RN Outcome: Adequate for Discharge 04/30/2019 1632 by Myriam Forehand, RN Outcome: Adequate for Discharge   Problem: Clinical Measurements: Goal: Ability to maintain clinical measurements within normal limits will improve 04/30/2019 1635 by Myriam Forehand, RN Outcome: Adequate for Discharge 04/30/2019 1634 by Myriam Forehand, RN Outcome: Adequate for Discharge 04/30/2019 1634 by Myriam Forehand, RN Outcome: Adequate for Discharge 04/30/2019 1632 by Myriam Forehand, RN Outcome: Adequate for Discharge 04/30/2019 1632 by Myriam Forehand, RN Outcome: Adequate for Discharge   Problem: Activity: Goal: Risk for activity intolerance will decrease 04/30/2019 1635 by Myriam Forehand, RN Outcome: Adequate for Discharge 04/30/2019 1634 by Myriam Forehand, RN Outcome: Adequate for Discharge 04/30/2019 1634 by Myriam Forehand, RN Outcome: Adequate for Discharge 04/30/2019 1632 by Myriam Forehand, RN Outcome: Adequate for Discharge 04/30/2019 1632 by Myriam Forehand, RN Outcome: Adequate for Discharge   Problem: Nutrition: Goal: Adequate nutrition will  be maintained 04/30/2019 1635 by Myriam Forehand, RN Outcome: Adequate for Discharge 04/30/2019 1634 by Myriam Forehand, RN Outcome: Adequate for Discharge 04/30/2019 1634 by Myriam Forehand, RN Outcome: Adequate for Discharge 04/30/2019 1632 by Myriam Forehand, RN Outcome: Adequate for Discharge 04/30/2019 1632 by Myriam Forehand, RN Outcome: Adequate for Discharge   Problem: Elimination: Goal: Will not experience complications related to bowel motility 04/30/2019 1635 by Myriam Forehand, RN Outcome: Adequate for Discharge 04/30/2019 1634 by Myriam Forehand, RN Outcome: Adequate for Discharge 04/30/2019 1634 by Myriam Forehand, RN Outcome: Adequate for Discharge 04/30/2019 1632 by Myriam Forehand, RN Outcome: Adequate for Discharge 04/30/2019 1632 by Myriam Forehand, RN Outcome: Adequate for Discharge   Problem: Pain Managment: Goal: General experience of comfort will improve 04/30/2019 1635 by Myriam Forehand, RN Outcome: Adequate for Discharge 04/30/2019 1634 by Myriam Forehand, RN Outcome: Adequate for Discharge 04/30/2019 1634 by Myriam Forehand, RN Outcome: Adequate for Discharge 04/30/2019 1632 by Myriam Forehand, RN Outcome: Adequate for Discharge 04/30/2019 1632 by Myriam Forehand, RN Outcome: Adequate for Discharge   Problem: Safety: Goal: Ability to remain free from injury will improve 04/30/2019 1635 by Myriam Forehand, RN Outcome: Adequate for Discharge 04/30/2019 1634 by Myriam Forehand, RN Outcome: Adequate for Discharge 04/30/2019 1634 by Myriam Forehand, RN Outcome: Adequate for Discharge 04/30/2019 1632 by Myriam Forehand, RN Outcome: Adequate for Discharge 04/30/2019 1632 by Myriam Forehand, RN Outcome: Adequate for Discharge   Problem: Skin Integrity: Goal: Risk for impaired skin integrity will decrease 04/30/2019 1635 by Myriam Forehand, RN Outcome: Adequate for Discharge 04/30/2019 1634 by Myriam Forehand, RN Outcome: Adequate for Discharge 04/30/2019 1634 by Myriam Forehand, RN Outcome: Adequate for  Discharge 04/30/2019 1632 by Myriam Forehand, RN Outcome: Adequate for Discharge 04/30/2019 1632 by Myriam Forehand, RN Outcome: Adequate for Discharge   Problem: Education: Goal: Knowledge of disease or condition will improve 04/30/2019 1635 by Myriam Forehand, RN Outcome: Adequate for Discharge 04/30/2019 1634 by  Myriam Forehand, RN Outcome: Adequate for Discharge 04/30/2019 1634 by Myriam Forehand, RN Outcome: Adequate for Discharge 04/30/2019 1632 by Myriam Forehand, RN Outcome: Adequate for Discharge 04/30/2019 1632 by Myriam Forehand, RN Outcome: Adequate for Discharge Goal: Knowledge of secondary prevention will improve 04/30/2019 1635 by Myriam Forehand, RN Outcome: Adequate for Discharge 04/30/2019 1634 by Myriam Forehand, RN Outcome: Adequate for Discharge 04/30/2019 1634 by Myriam Forehand, RN Outcome: Adequate for Discharge 04/30/2019 1632 by Myriam Forehand, RN Outcome: Adequate for Discharge 04/30/2019 1632 by Myriam Forehand, RN Outcome: Adequate for Discharge Goal: Knowledge of patient specific risk factors addressed and post discharge goals established will improve 04/30/2019 1635 by Myriam Forehand, RN Outcome: Adequate for Discharge 04/30/2019 1634 by Myriam Forehand, RN Outcome: Adequate for Discharge 04/30/2019 1634 by Myriam Forehand, RN Outcome: Adequate for Discharge 04/30/2019 1632 by Myriam Forehand, RN Outcome: Adequate for Discharge 04/30/2019 1632 by Myriam Forehand, RN Outcome: Adequate for Discharge   Problem: Coping: Goal: Will verbalize positive feelings about self 04/30/2019 1635 by Myriam Forehand, RN Outcome: Adequate for Discharge 04/30/2019 1634 by Myriam Forehand, RN Outcome: Adequate for Discharge 04/30/2019 1634 by Myriam Forehand, RN Outcome: Adequate for Discharge 04/30/2019 1632 by Myriam Forehand, RN Outcome: Adequate for Discharge 04/30/2019 1632 by Myriam Forehand, RN Outcome: Adequate for Discharge   Problem: Health Behavior/Discharge Planning: Goal: Ability to manage  health-related needs will improve 04/30/2019 1635 by Myriam Forehand, RN Outcome: Adequate for Discharge 04/30/2019 1634 by Myriam Forehand, RN Outcome: Adequate for Discharge 04/30/2019 1634 by Myriam Forehand, RN Outcome: Adequate for Discharge 04/30/2019 1632 by Myriam Forehand, RN Outcome: Adequate for Discharge 04/30/2019 1632 by Myriam Forehand, RN Outcome: Adequate for Discharge   Problem: Self-Care: Goal: Ability to participate in self-care as condition permits will improve 04/30/2019 1635 by Myriam Forehand, RN Outcome: Adequate for Discharge 04/30/2019 1634 by Myriam Forehand, RN Outcome: Adequate for Discharge 04/30/2019 1634 by Myriam Forehand, RN Outcome: Adequate for Discharge 04/30/2019 1632 by Myriam Forehand, RN Outcome: Adequate for Discharge 04/30/2019 1632 by Myriam Forehand, RN Outcome: Adequate for Discharge   Problem: Nutrition: Goal: Dietary intake will improve 04/30/2019 1635 by Myriam Forehand, RN Outcome: Adequate for Discharge 04/30/2019 1634 by Myriam Forehand, RN Outcome: Adequate for Discharge 04/30/2019 1634 by Myriam Forehand, RN Outcome: Adequate for Discharge 04/30/2019 1632 by Myriam Forehand, RN Outcome: Adequate for Discharge 04/30/2019 1632 by Myriam Forehand, RN Outcome: Adequate for Discharge   Problem: Ischemic Stroke/TIA Tissue Perfusion: Goal: Complications of ischemic stroke/TIA will be minimized 04/30/2019 1635 by Myriam Forehand, RN Outcome: Adequate for Discharge 04/30/2019 1634 by Myriam Forehand, RN Outcome: Adequate for Discharge 04/30/2019 1634 by Myriam Forehand, RN Outcome: Adequate for Discharge 04/30/2019 1632 by Myriam Forehand, RN Outcome: Adequate for Discharge 04/30/2019 1632 by Myriam Forehand, RN Outcome: Adequate for Discharge   Problem: Education: Goal: Knowledge of General Education information will improve Description: Including pain rating scale, medication(s)/side effects and non-pharmacologic comfort measures 04/30/2019 1635 by Myriam Forehand, RN Outcome:  Adequate for Discharge 04/30/2019 1634 by Myriam Forehand, RN Outcome: Adequate for Discharge 04/30/2019 1634 by Myriam Forehand, RN Outcome: Adequate for Discharge 04/30/2019 1632 by Myriam Forehand, RN Outcome: Adequate for Discharge 04/30/2019 1632 by Myriam Forehand, RN Outcome: Adequate for Discharge   Problem: Health Behavior/Discharge Planning: Goal: Ability to manage health-related needs will improve 04/30/2019 1635 by Myriam Forehand, RN Outcome: Adequate for Discharge 04/30/2019 1634 by Myriam Forehand, RN Outcome: Adequate for Discharge 04/30/2019 1634 by Myriam Forehand, RN Outcome: Adequate for Discharge 04/30/2019 1632 by Myriam Forehand, RN Outcome: Adequate  for Discharge 04/30/2019 1632 by Myriam Forehand, RN Outcome: Adequate for Discharge   Problem: Clinical Measurements: Goal: Ability to maintain clinical measurements within normal limits will improve 04/30/2019 1635 by Myriam Forehand, RN Outcome: Adequate for Discharge 04/30/2019 1634 by Myriam Forehand, RN Outcome: Adequate for Discharge 04/30/2019 1634 by Myriam Forehand, RN Outcome: Adequate for Discharge 04/30/2019 1632 by Myriam Forehand, RN Outcome: Adequate for Discharge 04/30/2019 1632 by Myriam Forehand, RN Outcome: Adequate for Discharge   Problem: Activity: Goal: Risk for activity intolerance will decrease 04/30/2019 1635 by Myriam Forehand, RN Outcome: Adequate for Discharge 04/30/2019 1634 by Myriam Forehand, RN Outcome: Adequate for Discharge 04/30/2019 1634 by Myriam Forehand, RN Outcome: Adequate for Discharge 04/30/2019 1632 by Myriam Forehand, RN Outcome: Adequate for Discharge 04/30/2019 1632 by Myriam Forehand, RN Outcome: Adequate for Discharge   Problem: Nutrition: Goal: Adequate nutrition will be maintained 04/30/2019 1635 by Myriam Forehand, RN Outcome: Adequate for Discharge 04/30/2019 1634 by Myriam Forehand, RN Outcome: Adequate for Discharge 04/30/2019 1634 by Myriam Forehand, RN Outcome: Adequate for Discharge 04/30/2019 1632  by Myriam Forehand, RN Outcome: Adequate for Discharge 04/30/2019 1632 by Myriam Forehand, RN Outcome: Adequate for Discharge   Problem: Elimination: Goal: Will not experience complications related to bowel motility 04/30/2019 1635 by Myriam Forehand, RN Outcome: Adequate for Discharge 04/30/2019 1634 by Myriam Forehand, RN Outcome: Adequate for Discharge 04/30/2019 1634 by Myriam Forehand, RN Outcome: Adequate for Discharge 04/30/2019 1632 by Myriam Forehand, RN Outcome: Adequate for Discharge 04/30/2019 1632 by Myriam Forehand, RN Outcome: Adequate for Discharge   Problem: Pain Managment: Goal: General experience of comfort will improve 04/30/2019 1635 by Myriam Forehand, RN Outcome: Adequate for Discharge 04/30/2019 1634 by Myriam Forehand, RN Outcome: Adequate for Discharge 04/30/2019 1634 by Myriam Forehand, RN Outcome: Adequate for Discharge 04/30/2019 1632 by Myriam Forehand, RN Outcome: Adequate for Discharge 04/30/2019 1632 by Myriam Forehand, RN Outcome: Adequate for Discharge   Problem: Safety: Goal: Ability to remain free from injury will improve 04/30/2019 1635 by Myriam Forehand, RN Outcome: Adequate for Discharge 04/30/2019 1634 by Myriam Forehand, RN Outcome: Adequate for Discharge 04/30/2019 1634 by Myriam Forehand, RN Outcome: Adequate for Discharge 04/30/2019 1632 by Myriam Forehand, RN Outcome: Adequate for Discharge 04/30/2019 1632 by Myriam Forehand, RN Outcome: Adequate for Discharge   Problem: Skin Integrity: Goal: Risk for impaired skin integrity will decrease 04/30/2019 1635 by Myriam Forehand, RN Outcome: Adequate for Discharge 04/30/2019 1634 by Myriam Forehand, RN Outcome: Adequate for Discharge 04/30/2019 1634 by Myriam Forehand, RN Outcome: Adequate for Discharge 04/30/2019 1632 by Myriam Forehand, RN Outcome: Adequate for Discharge 04/30/2019 1632 by Myriam Forehand, RN Outcome: Adequate for Discharge   Problem: Education: Goal: Knowledge of disease or condition will improve 04/30/2019 1635  by Myriam Forehand, RN Outcome: Adequate for Discharge 04/30/2019 1634 by Myriam Forehand, RN Outcome: Adequate for Discharge 04/30/2019 1634 by Myriam Forehand, RN Outcome: Adequate for Discharge 04/30/2019 1632 by Myriam Forehand, RN Outcome: Adequate for Discharge 04/30/2019 1632 by Myriam Forehand, RN Outcome: Adequate for Discharge Goal: Knowledge of secondary prevention will improve 04/30/2019 1635 by Myriam Forehand, RN Outcome: Adequate for Discharge 04/30/2019 1634 by Myriam Forehand, RN Outcome: Adequate for Discharge 04/30/2019 1634 by Myriam Forehand, RN Outcome: Adequate for Discharge 04/30/2019 1632 by Myriam Forehand, RN Outcome: Adequate for Discharge 04/30/2019 1632 by Myriam Forehand, RN Outcome: Adequate for Discharge Goal: Knowledge of patient specific risk factors addressed and post discharge goals established will improve 04/30/2019 1635 by Myriam Forehand, RN Outcome: Adequate for Discharge 04/30/2019 1634 by Elba Barman,  Peng-Sian, RN Outcome: Adequate for Discharge 04/30/2019 1634 by Myriam Forehand, RN Outcome: Adequate for Discharge 04/30/2019 1632 by Myriam Forehand, RN Outcome: Adequate for Discharge 04/30/2019 1632 by Myriam Forehand, RN Outcome: Adequate for Discharge   Problem: Coping: Goal: Will verbalize positive feelings about self 04/30/2019 1635 by Myriam Forehand, RN Outcome: Adequate for Discharge 04/30/2019 1634 by Myriam Forehand, RN Outcome: Adequate for Discharge 04/30/2019 1634 by Myriam Forehand, RN Outcome: Adequate for Discharge 04/30/2019 1632 by Myriam Forehand, RN Outcome: Adequate for Discharge 04/30/2019 1632 by Myriam Forehand, RN Outcome: Adequate for Discharge   Problem: Health Behavior/Discharge Planning: Goal: Ability to manage health-related needs will improve 04/30/2019 1635 by Myriam Forehand, RN Outcome: Adequate for Discharge 04/30/2019 1634 by Myriam Forehand, RN Outcome: Adequate for Discharge 04/30/2019 1634 by Myriam Forehand, RN Outcome: Adequate for Discharge 04/30/2019  1632 by Myriam Forehand, RN Outcome: Adequate for Discharge 04/30/2019 1632 by Myriam Forehand, RN Outcome: Adequate for Discharge   Problem: Self-Care: Goal: Ability to participate in self-care as condition permits will improve 04/30/2019 1635 by Myriam Forehand, RN Outcome: Adequate for Discharge 04/30/2019 1634 by Myriam Forehand, RN Outcome: Adequate for Discharge 04/30/2019 1634 by Myriam Forehand, RN Outcome: Adequate for Discharge 04/30/2019 1632 by Myriam Forehand, RN Outcome: Adequate for Discharge 04/30/2019 1632 by Myriam Forehand, RN Outcome: Adequate for Discharge   Problem: Nutrition: Goal: Dietary intake will improve 04/30/2019 1635 by Myriam Forehand, RN Outcome: Adequate for Discharge 04/30/2019 1634 by Myriam Forehand, RN Outcome: Adequate for Discharge 04/30/2019 1634 by Myriam Forehand, RN Outcome: Adequate for Discharge 04/30/2019 1632 by Myriam Forehand, RN Outcome: Adequate for Discharge 04/30/2019 1632 by Myriam Forehand, RN Outcome: Adequate for Discharge   Problem: Ischemic Stroke/TIA Tissue Perfusion: Goal: Complications of ischemic stroke/TIA will be minimized 04/30/2019 1635 by Myriam Forehand, RN Outcome: Adequate for Discharge 04/30/2019 1634 by Myriam Forehand, RN Outcome: Adequate for Discharge 04/30/2019 1634 by Myriam Forehand, RN Outcome: Adequate for Discharge 04/30/2019 1632 by Myriam Forehand, RN Outcome: Adequate for Discharge 04/30/2019 1632 by Myriam Forehand, RN Outcome: Adequate for Discharge

## 2019-04-30 NOTE — Progress Notes (Signed)
PT Cancellation Note/Discharge  Patient Details Name: NATION CRADLE MRN: [id number] DOB: 05/03/1936   Cancelled Treatment:    Reason Eval/Treat Not Completed: PT screened, no needs identified, will sign off.  OT screened for PT needs.  No needs identified at this time.  PT to sign off.   Thanks,  Barbarann Ehlers. Lukas Pelcher, PT, DPT  Acute Rehabilitation [phone] pager [phone] office  @ [facility]: [phone]     Harvie Heck 04/30/2019, 11:44 AM

## 2019-05-04 ENCOUNTER — Telehealth: Payer: Self-pay

## 2019-05-04 MED ORDER — POTASSIUM CITRATE ER 10 MEQ (1080 MG) PO TBCR: EXTENDED_RELEASE_TABLET | ORAL | 0 refills | Status: DC

## 2019-05-04 NOTE — Telephone Encounter (Signed)
Faxed refill request for pts potassium citrate tablets (see previous phone note). Pt was called and message was left on VM telling pt we received a refill request and needed to know if he would like that refilled and which pharmacy. Pt was provided with call back number.

## 2019-05-04 NOTE — Telephone Encounter (Signed)
Pt returned call and stated he did need a refill sent to [facility]. Pt confirmed he is taking two tablets two times daily. Pt asked for 90 day RX due to insurance and costs. Pt has 06/2019 appt scheduled as well as hospital F/U scheduled next week.

## 2019-05-04 NOTE — Addendum Note (Signed)
Addended by: Caroll Rancher L on: 05/04/2019 01:44 PM   Modules accepted: Orders

## 2019-05-04 NOTE — Telephone Encounter (Signed)
Spoke with patients wife, Paulette.   Transition Care Management Follow-up Telephone Call  Admit date: 04/29/2019 Discharge date: 04/30/2019 Principal Problem: Visual field defect   How have you been since you were released from the hospital? "He's doing okay"   Do you understand why you were in the hospital? Yes. Wife states pt had vision loss. He was evaluated at eye doctor who instructed him to go to ER. Stroke workup completed.    Do you understand the discharge instructions? yes   Where were you discharged to? Home. Resides with wife.    Items Reviewed:  Medications reviewed: yes  Allergies reviewed: yes  Dietary changes reviewed: yes  Referrals reviewed: yes   Functional Questionnaire:   Activities of Daily Living (ADLs):   He states they are independent in the following: ambulation, bathing and hygiene, feeding, continence, grooming, toileting and dressing States they require assistance with the following: None.   Any transportation issues/concerns?: no   Any patient concerns? no   Confirmed importance and date/time of follow-up visits scheduled yes  Provider Appointment booked with PCP 05/13/2019 @ 11am.   Confirmed with patient if condition begins to worsen call PCP or go to the ER.  Patient was given the office number and encouraged to call back with question or concerns.  : yes

## 2019-05-13 ENCOUNTER — Encounter: Payer: Self-pay | Admitting: Family Medicine

## 2019-05-13 ENCOUNTER — Ambulatory Visit (INDEPENDENT_AMBULATORY_CARE_PROVIDER_SITE_OTHER): Payer: PPO | Admitting: Family Medicine

## 2019-05-13 ENCOUNTER — Other Ambulatory Visit: Payer: Self-pay

## 2019-05-13 VITALS — BP 131/69 | HR 63 | Temp 97.4°F | Resp 18 | Ht 68.0 in | Wt 183.5 lb

## 2019-05-13 DIAGNOSIS — I359 Nonrheumatic aortic valve disorder, unspecified: Secondary | ICD-10-CM

## 2019-05-13 DIAGNOSIS — R931 Abnormal findings on diagnostic imaging of heart and coronary circulation: Secondary | ICD-10-CM

## 2019-05-13 DIAGNOSIS — E039 Hypothyroidism, unspecified: Secondary | ICD-10-CM

## 2019-05-13 DIAGNOSIS — Z9289 Personal history of other medical treatment: Secondary | ICD-10-CM | POA: Diagnosis not present

## 2019-05-13 DIAGNOSIS — E663 Overweight: Secondary | ICD-10-CM | POA: Diagnosis not present

## 2019-05-13 DIAGNOSIS — G459 Transient cerebral ischemic attack, unspecified: Secondary | ICD-10-CM

## 2019-05-13 DIAGNOSIS — I350 Nonrheumatic aortic (valve) stenosis: Secondary | ICD-10-CM

## 2019-05-13 DIAGNOSIS — I10 Essential (primary) hypertension: Secondary | ICD-10-CM | POA: Diagnosis not present

## 2019-05-13 DIAGNOSIS — E785 Hyperlipidemia, unspecified: Secondary | ICD-10-CM | POA: Diagnosis not present

## 2019-05-13 DIAGNOSIS — I517 Cardiomegaly: Secondary | ICD-10-CM

## 2019-05-13 DIAGNOSIS — R7989 Other specified abnormal findings of blood chemistry: Secondary | ICD-10-CM | POA: Diagnosis not present

## 2019-05-13 DIAGNOSIS — H5340 Unspecified visual field defects: Secondary | ICD-10-CM | POA: Diagnosis not present

## 2019-05-13 DIAGNOSIS — I351 Nonrheumatic aortic (valve) insufficiency: Secondary | ICD-10-CM

## 2019-05-13 DIAGNOSIS — Z8673 Personal history of transient ischemic attack (TIA), and cerebral infarction without residual deficits: Secondary | ICD-10-CM | POA: Insufficient documentation

## 2019-05-13 HISTORY — DX: Transient cerebral ischemic attack, unspecified: G45.9

## 2019-05-13 HISTORY — DX: Hypothyroidism, unspecified: E03.9

## 2019-05-13 HISTORY — DX: Cardiomegaly: I51.7

## 2019-05-13 HISTORY — DX: Nonrheumatic aortic (valve) stenosis: I35.0

## 2019-05-13 HISTORY — DX: Nonrheumatic aortic (valve) insufficiency: I35.1

## 2019-05-13 HISTORY — DX: Other specified abnormal findings of blood chemistry: R79.89

## 2019-05-13 HISTORY — DX: Nonrheumatic aortic valve disorder, unspecified: I35.9

## 2019-05-13 HISTORY — DX: Abnormal findings on diagnostic imaging of heart and coronary circulation: R93.1

## 2019-05-13 LAB — COMPREHENSIVE METABOLIC PANEL
ALT: 15 U/L (ref 0–53)
AST: 16 U/L (ref 0–37)
Albumin: 4.7 g/dL (ref 3.5–5.2)
Alkaline Phosphatase: 55 U/L (ref 39–117)
BUN: 40 mg/dL — ABNORMAL HIGH (ref 6–23)
CO2: 25 mEq/L (ref 19–32)
Calcium: 9.8 mg/dL (ref 8.4–10.5)
Chloride: 106 mEq/L (ref 96–112)
Creatinine, Ser: 0.74 mg/dL (ref 0.40–1.50)
GFR: 101.6 mL/min (ref >60.00)
Glucose, Bld: 95 mg/dL (ref 70–99)
Potassium: 4.9 mEq/L (ref 3.5–5.1)
Sodium: 139 mEq/L (ref 135–145)
Total Bilirubin: 0.7 mg/dL (ref 0.2–1.2)
Total Protein: 6.9 g/dL (ref 6.0–8.3)

## 2019-05-13 LAB — T4, FREE: Free T4: 0.72 ng/dL (ref 0.60–1.60)

## 2019-05-13 LAB — TSH: TSH: 3.52 u[IU]/mL (ref 0.35–4.50)

## 2019-05-13 NOTE — Progress Notes (Signed)
[name] [name] , January 08, 1936, 83 y.o., male MRN: [id number] Patient Care Team    Relationship Specialty Notifications Start End  Ma Hillock, DO PCP - General Family Medicine  01/19/19     Chief Complaint  Patient presents with   Hospitalization Follow-up    Pt is doing well with no complaints.      Subjective:  [name] [name]  is a 83 y.o. male presents for hospital follow up after recent admission on 04/29/2019 for primary diagnosis visual changes suspected TIA. Patient was discharged on 04/30/2019 to home. Patients discharge summary has been reviewed, as well as all labs/image studies obtained during hospitalization.   Patients hospital course: Patient reports he was experiencing some visual changes in his right lower visual field on the day of admission.  He saw his ophthalmologist which had concerns for stroke and told him to present to the emergency room.  Patient reports the onset of the visual disturbance occurred while he was having a bowel movement.  He noticed that his right lower visual field had turned gray and then to black and started to move from his lateral visual field to medial.  He states the symptoms lasted no more than 40 or 45 minutes and then completely resolved on their own.  He did also noticed some gait instability later that afternoon and felt he was leaning to the right.  Only lasted a few minutes and resolved on its own.  He remained stable during his hospital stay.   He underwent a full stroke work-up.  MRI the brain did not show any stroke.  CT of the head and neck did not show any vascular findings.  His echocardiogram did result with normal left ventricular systolic function with ejection fraction 60-65%.  There was also noted to be mild left atrial enlargement, mild aortic stenosis with severely calcified valve, mild aortic insufficiency and a possible PFO.  Labs reviewed sodium 140, potassium 3.6, chloride 109, CO2 20, glucose 102, BUN 35,  creatinine 0.64, calcium 9.4, alk phos 52, albumin 4.1, AST 20, ALT 19, total protein 6.8, GFR greater than 60.  Lipids with total cholesterol 122, HDL 56, LDL 59, triglycerides 34.  CRP normal.  B12 499.  CBC stable with normal hemoglobin and mildly decreased hematocrit at 38.7.  Sed rate normal.  PT 13.6, INR 1.1.  PTT 27.  A1c 5.3.  His TSH was 4.923.  TSH has been slowly increasing the last few years, this is the first he has been outside of normal parameters. Since hospital discharge patient reports he is doing very well.  He states he never really did feel bad except for those few minutes in which he was having the symptoms.  He is eating drinking his normal.  His blood pressure has been maintained and controlled.  He has not had any recurrent events.  Ct Angio Head W Or Wo Contrast  Result Date: 04/29/2019 CLINICAL DATA:  Visual disturbance beginning 0930 hours today. Inferior visual field affected. EXAM: CT ANGIOGRAPHY HEAD AND NECK TECHNIQUE: Multidetector CT imaging of the head and neck was performed using the standard protocol during bolus administration of intravenous contrast. Multiplanar CT image reconstructions and MIPs were obtained to evaluate the vascular anatomy. Carotid stenosis measurements (when applicable) are obtained utilizing NASCET criteria, using the distal internal carotid diameter as the denominator. CONTRAST:  123m OMNIPAQUE IOHEXOL 350 MG/ML SOLN COMPARISON:  03/12/2017 CT.  05/15/2010 MRI. FINDINGS: CT HEAD FINDINGS Brain: Mild age  related volume loss. Mild chronic small-vessel ischemic change of the deep white matter. 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 No sign of acute infarction, mass lesion, hemorrhage, hydrocephalus or extra-axial collection. Vascular: There is atherosclerotic calcification of the major vessels at the base of the brain. Skull: Negative Sinuses: Clear Orbits: Normal Review of the MIP images confirms the above findings CTA NECK FINDINGS Aortic arch:  Minimal atherosclerosis of the arch. No aneurysm or dissection. Branching pattern is normal without origin stenosis. Right carotid system: Common carotid artery widely patent to the bifurcation. Mild atherosclerotic calcification at the carotid bifurcation and ICA bulb but no stenosis. Cervical ICA is normal beyond that. Left carotid system: Common carotid artery is widely patent to the bifurcation. Mild atherosclerotic plaque at the ICA bulb but no stenosis. Cervical ICA is widely patent beyond that. Vertebral arteries: Left vertebral artery origins widely patent. Right vertebral artery origin cannot be seen because of streak artifact from shoulder replacement. Both vertebral arteries are approximately equal in size an widely patent beyond that origin region through the cervical region to the foramen magnum. Skeleton: Ordinary cervical spondylosis. Other neck: No mass or lymphadenopathy. Upper chest: Normal Review of the MIP images confirms the above findings CTA HEAD FINDINGS Anterior circulation: Both internal carotid arteries are patent through the skull base and siphon regions. No siphon stenosis. The anterior and middle cerebral vessels are patent without proximal stenosis, aneurysm or vascular malformation. Posterior circulation: Both vertebral arteries are patent at the foramen magnum. There is atherosclerotic plaque in both V4 segments with stenosis estimated at 50% on each side. Posteroinferior cerebellar arteries show flow. Both vertebral arteries reach the basilar. No basilar stenosis. Superior cerebellar and posterior cerebral arteries show flow. Venous sinuses: Patent and normal. Anatomic variants: None significant. Review of the MIP images confirms the above findings IMPRESSION: No acute vascular finding.  No large or medium vessel occlusion. Atherosclerotic disease at both carotid bifurcations but without stenosis or pronounced irregularity. Right vertebral artery origin not visible because of streak  artifact from right shoulder replacement. 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 Vessels sufficiently patent beyond that to the basilar artery. Electronically Signed   By: Nelson Chimes M.D.   On: 04/29/2019 19:06   Ct Angio Neck W And/or Wo Contrast  Result Date: 04/29/2019 CLINICAL DATA:  Visual disturbance beginning 0930 hours today. Inferior visual field affected. EXAM: CT ANGIOGRAPHY HEAD AND NECK TECHNIQUE: Multidetector CT imaging of the head and neck was performed using the standard protocol during bolus administration of intravenous contrast. Multiplanar CT image reconstructions and MIPs were obtained to evaluate the vascular anatomy. Carotid stenosis measurements (when applicable) are obtained utilizing NASCET criteria, using the distal internal carotid diameter as the denominator. CONTRAST:  11m OMNIPAQUE IOHEXOL 350 MG/ML SOLN COMPARISON:  03/12/2017 CT.  05/15/2010 MRI. FINDINGS: CT HEAD FINDINGS Brain: Mild age related volume loss. Mild chronic small-vessel ischemic change of the deep white matter. 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 No sign of acute infarction, mass lesion, hemorrhage, hydrocephalus or extra-axial collection. Vascular: There is atherosclerotic calcification of the major vessels at the base of the brain. Skull: Negative Sinuses: Clear Orbits: Normal Review of the MIP images confirms the above findings CTA NECK FINDINGS Aortic arch: Minimal atherosclerosis of the arch. No aneurysm or dissection. Branching pattern is normal without origin stenosis. Right carotid system: Common carotid artery widely patent to the bifurcation. Mild atherosclerotic calcification at the carotid bifurcation and ICA bulb but no  stenosis. Cervical ICA is normal beyond that. Left carotid system: Common carotid artery is widely patent to the bifurcation. Mild atherosclerotic plaque at the ICA bulb but no stenosis. Cervical ICA is widely patent  beyond that. Vertebral arteries: Left vertebral artery origins widely patent. Right vertebral artery origin cannot be seen because of streak artifact from shoulder replacement. Both vertebral arteries are approximately equal in size an widely patent beyond that origin region through the cervical region to the foramen magnum. Skeleton: Ordinary cervical spondylosis. Other neck: No mass or lymphadenopathy. Upper chest: Normal Review of the MIP images confirms the above findings CTA HEAD FINDINGS Anterior circulation: Both internal carotid arteries are patent through the skull base and siphon regions. No siphon stenosis. The anterior and middle cerebral vessels are patent without proximal stenosis, aneurysm or vascular malformation. Posterior circulation: Both vertebral arteries are patent at the foramen magnum. There is atherosclerotic plaque in both V4 segments with stenosis estimated at 50% on each side. Posteroinferior cerebellar arteries show flow. Both vertebral arteries reach the basilar. No basilar stenosis. Superior cerebellar and posterior cerebral arteries show flow. Venous sinuses: Patent and normal. Anatomic variants: None significant. Review of the MIP images confirms the above findings IMPRESSION: No acute vascular finding.  No large or medium vessel occlusion. Atherosclerotic disease at both carotid bifurcations but without stenosis or pronounced irregularity. 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 Vessels sufficiently patent beyond that to the basilar artery. Electronically Signed   By: Nelson Chimes M.D.   On: 04/29/2019 19:06   Mr Brain Wo Contrast  Result Date: 04/29/2019 CLINICAL DATA:  83 year old male with visual disturbance beginning at 0930 hours today. Inferior visual field affected. EXAM: MRI HEAD WITHOUT CONTRAST TECHNIQUE:  Multiplanar, multiecho pulse sequences of the brain and surrounding structures were obtained without intravenous contrast. COMPARISON:  CTA head and neck earlier today.  Brain MRI 05/15/2010. FINDINGS: Brain: No restricted diffusion to suggest acute infarction. No midline shift, mass effect, evidence of mass lesion, ventriculomegaly, extra-axial collection or acute intracranial hemorrhage. Cervicomedullary junction and pituitary are within normal limits. Scattered mild for age cerebral white matter T2 and FLAIR hyperintensity, only mildly progressed since 2011. No occipital cortical encephalomalacia or discrete signal changes along the visual pathways. No chronic blood products. No cortical encephalomalacia identified. There are multiple small chronic basal ganglia lacunar infarcts which are new since 2011 (series 10, image 15). Thalami, brainstem and cerebellum remain normal for age. Vascular: Major intracranial vascular flow voids are stable since 2011 with mild to moderate generalized intracranial artery tortuosity. Skull and upper cervical spine: Negative visible cervical spine. Visualized bone marrow signal is within normal limits. Sinuses/Orbits: Orbits appear stable since 2011 and negative. Mild to moderate bilateral paranasal sinus mucosal thickening has mildly progressed. Stable chronic left maxillary sinus mucous retention cyst. Other: Mastoids remain clear. Visible internal auditory structures appear normal. Scalp and face soft tissues appear negative. IMPRESSION: 1. No acute intracranial abnormality. No explanation for acute visual disturbance. 2. Chronic small vessel disease is mild for age, and mildly progressed since 2011. Chronic intracranial artery tortuosity. 3. Mild to moderate paranasal sinus disease. Electronically Signed   By: Genevie Ann M.D.   On: 04/29/2019 23:48   Vas [place] Lower Extremity Venous (dvt)  Result Date: 05/02/2019  Lower Venous Study Indications: Stroke.  Risk Factors: None  identified. Comparison Study: No prior studies. Performing Technologist: Belenda Cruise  Collins RVT  Examination Guidelines: A complete evaluation includes B-mode imaging, spectral Doppler, color Doppler, and power Doppler as needed of all accessible portions of each vessel. Bilateral testing is considered an integral part of a complete examination. Limited examinations for reoccurring indications may be performed as noted.  +---------+---------------+---------+-----------+----------+-------+  RIGHT     Compressibility Phasicity Spontaneity Properties Summary  +---------+---------------+---------+-----------+----------+-------+  CFV       Full            Yes       Yes                             +---------+---------------+---------+-----------+----------+-------+  SFJ       Full                                                      +---------+---------------+---------+-----------+----------+-------+  FV Prox   Full                                                      +---------+---------------+---------+-----------+----------+-------+  FV Mid    Full                                                      +---------+---------------+---------+-----------+----------+-------+  FV Distal Full                                                      +---------+---------------+---------+-----------+----------+-------+  PFV       Full                                                      +---------+---------------+---------+-----------+----------+-------+  POP       Full            Yes       Yes                             +---------+---------------+---------+-----------+----------+-------+  PTV       Full                                                      +---------+---------------+---------+-----------+----------+-------+  PERO      Full                                                      +---------+---------------+---------+-----------+----------+-------+   +---------+---------------+---------+-----------+----------+-------+  LEFT       Compressibility Phasicity Spontaneity Properties Summary  +---------+---------------+---------+-----------+----------+-------+  CFV       Full            Yes       Yes                             +---------+---------------+---------+-----------+----------+-------+  SFJ       Full                                                      +---------+---------------+---------+-----------+----------+-------+  FV Prox   Full                                                      +---------+---------------+---------+-----------+----------+-------+  FV Mid    Full                                                      +---------+---------------+---------+-----------+----------+-------+  FV Distal Full                                                      +---------+---------------+---------+-----------+----------+-------+  PFV       Full                                                      +---------+---------------+---------+-----------+----------+-------+  POP       Full            Yes       Yes                             +---------+---------------+---------+-----------+----------+-------+  PTV       Full                                                      +---------+---------------+---------+-----------+----------+-------+  PERO      Full                                                      +---------+---------------+---------+-----------+----------+-------+     Summary: Right: There is no evidence of deep vein thrombosis in the lower extremity. No cystic structure found in the popliteal fossa. Left: There is no evidence of deep vein thrombosis in the lower extremity. No cystic structure  found in the popliteal fossa.  *See table(s) above for measurements and observations. Electronically signed by Ruta Hinds MD on 05/02/2019 at 12:14:21 PM.    Final      Depression screen [facility] 2/9 01/19/2019 04/09/2018 04/01/2016 09/13/2014  Decreased Interest 0 0 0 0  Down, Depressed, Hopeless 0 0 0 0  PHQ - 2 Score 0 0 0 0    No  Known Allergies Social History   Tobacco Use   Smoking status: Never Smoker   Smokeless tobacco: Never Used  Substance Use Topics   Alcohol use: No   Past Medical History:  Diagnosis Date   ALLERGIC RHINITIS 04/23/2007   BENIGN PROSTATIC HYPERTROPHY 04/23/2007   resolved after TURP   Cellulitis june 2013   left side("left flank")   CHEST PAIN 02/04/2008   H/O blurred vision    both eyes, occasional   Headache    HYPERLIPIDEMIA 04/23/2007   HYPERTENSION 06/02/2008   NEPHROLITHIASIS, HX OF 04/23/2007   lithotripsy- fragment remains, no symptoms    Neuropathy    s/p trauma to left lower ext (horse accident)   Neuropathy of left foot    OSTEOARTHRITIS 04/23/2007   Palpitations 02/04/2008   Qualifier: Diagnosis of  By: Burnice Logan  MD, Doretha Sou    Pneumonia    PONV (postoperative nausea and vomiting)    RECTAL BLEEDING 02/04/2008   no recent problems   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)   Past Surgical History:  Procedure Laterality Date   APPENDECTOMY     COLONOSCOPY     HERNIA REPAIR     left foot surgery for fx  Jul 30, 2011   LITHOTRIPSY  1992   cystoscopy with stent placement also done(1 stone fragment remains)   right shoulder replacement  Sep 28, 2009   TOTAL KNEE ARTHROPLASTY Right 08/30/2013   Procedure: RIGHT TOTAL KNEE ARTHROPLASTY RIGHT ;  Surgeon: Gearlean Alf, MD;  Location: [facility];  Service: Orthopedics;  Laterality: Right;   TOTAL SHOULDER ARTHROPLASTY Left 02/02/2015   Procedure: LEFT TOTAL SHOULDER ARTHROPLASTY;  Surgeon: Justice Britain, MD;  Location: [place];  Service: Orthopedics;  Laterality: Left;   TRANSURETHRAL RESECTION OF PROSTATE  03/27/2012   Procedure: TRANSURETHRAL RESECTION OF THE PROSTATE WITH GYRUS INSTRUMENTS;  Surgeon: Claybon Jabs, MD;  Location: [facility];  Service: Urology;;   Family History  Problem Relation Age of Onset   Heart disease Mother 47       MI   Stroke Father 40   COPD Sister     Diabetes Sister    Diabetes Brother    Allergies as of 05/13/2019   No Known Allergies     Medication List       Accurate as of May 13, 2019 11:14 AM. If you have any questions, ask your nurse or doctor.        benazepril-hydrochlorthiazide 20-12.5 MG tablet Commonly known as: LOTENSIN HCT Take 1 tablet by mouth every morning. What changed: how much to take   celecoxib 200 MG capsule Commonly known as: CELEBREX Take 1 capsule (200 mg total) by mouth daily. What changed: when to take this   CoQ10 200 MG Caps Take 200 mg by mouth at bedtime.   fluticasone 50 MCG/ACT nasal spray Commonly known as: FLONASE Place 1 spray into both nostrils daily. What changed:   when to take this  reasons to take this   gabapentin 600 MG tablet Commonly known as: NEURONTIN Take 1 tablet (600  mg total) by mouth 3 (three) times daily. What changed: when to take this   hydroxypropyl methylcellulose / hypromellose 2.5 % ophthalmic solution Commonly known as: ISOPTO TEARS / GONIOVISC Place 1 drop into both eyes as needed for dry eyes.   Krill Oil 1000 MG Caps Take 2,000 mg by mouth daily with breakfast.   loratadine 10 MG tablet Commonly known as: CLARITIN Take 10 mg by mouth at bedtime.   METAMUCIL FIBER PO Take 2 capsules by mouth daily with breakfast.   NON FORMULARY Take 1 capsule by mouth See admin instructions. MedOp MaxiVision Whole Body Formula capsules: Take 1 capsule by mouth two times a day   NON FORMULARY Take 1 packet by mouth See admin instructions. Relief Factor: Dissolve 1 packet into preferred beverage and drink two times a day   NON FORMULARY Take 2 capsules by mouth See admin instructions. Clear Lungs capsules: Take 2 capsules by mouth in the morning   potassium citrate 10 MEQ (1080 MG) SR tablet Commonly known as: UROCIT-K TAKE 2 TABLETS BY MOUTH  TWICE DAILY WITH A MEAL   simvastatin 40 MG tablet Commonly known as: ZOCOR Take 1 tablet (40 mg total)  by mouth at bedtime.   vitamin C 500 MG tablet Commonly known as: ASCORBIC ACID Take 1,500 mg by mouth at bedtime.   Vitamin D3 125 MCG (5000 UT) Caps Take 5,000 Units by mouth at bedtime.       All past medical history, surgical history, allergies, family history, immunizations and medications were updated in the EMR today and reviewed under the history and medication portions of their EMR.      ROS: Negative, with the exception of above mentioned in HPI  Objective:  BP 131/69 (BP Location: Left Arm, Patient Position: Sitting, Cuff Size: Normal)    Pulse 63    Temp (!) 97.4 F (36.3 C) (Temporal)    Resp 18    Ht 5' 8" (1.727 m)    Wt 183 lb 8 oz (83.2 kg)    SpO2 99%    BMI 27.90 kg/m  Body mass index is 27.9 kg/m. Gen: Afebrile. No acute distress. Nontoxic in appearance, well developed, well nourished.  HENT: AT. [email or web address].MMM, no oral lesions.  Eyes:Pupils Equal Round Reactive to light, Extraocular movements intact,  Conjunctiva without redness, discharge or icterus. Neck/lymp/endocrine: Supple, no lymphadenopathy, no thyromegaly. CV: RRR 1/6 SM appreciated, no edema Chest: CTAB, no wheeze or crackles. Good air movement, normal resp effort.  Skin: No rashes, purpura or petechiae.  Neuro:  Normal gait. PERLA. EOMi. Alert.  Psych: Normal affect, dress and demeanor. Normal speech. Normal thought content and judgment.  Assessment/Plan: [name] [name] is a 83 y.o. male present for OV for Hospital discharge follow up Elevated TSH We will recheck TSH, added T4 today.   - TSH - T4, free - Comp Met (CMET)  TIA (transient ischemic attack)/Visual field defect/History of recent hospitalization -Discussed with patient TIAs can be the warning sign to future events such as a stroke.  Encouraged him to add back baby aspirin to his daily regimen.  Recommended he be evaluated by a cardiologist concerning the echocardiogram abnormalities.  He is agreeable to this today. - TSH - T4, free -  Comp Met (CMET)   AS/AI/Aortic valve calcification/LAE (left atrial enlargement)/Abnormal echocardiogram Patient denies any symptoms from the echocardiogram abnormalities.  His results are concerning for rather significant aortic valve calcifications and a possible PFO.  Discussed options with him today.  And  will refer to cardiology for further evaluation and recommendations on the above. - Ambulatory referral to Cardiology  Overweight (BMI 25.0-29.9)/Essential hypertension/Dyslipidemia -Blood pressures are stable.  He is compliant with BP meds and statin. -Encouraged him to restart his baby aspirin daily. Continue Lotensin 1 tab daily. Continue statin. Mediterranean/heart healthy diet. Keep routine follow-ups.  Has a physical scheduled in October.     Reviewed expectations re: course of current medical issues.  Discussed self-management of symptoms.  Outlined signs and symptoms indicating need for more acute intervention.  Patient verbalized understanding and all questions were answered.  Patient received an After-Visit Summary.  Any changes in medications were reviewed and patient was provided with updated med list with their AVS.      No orders of the defined types were placed in this encounter.    Note is dictated utilizing voice recognition software. Although note has been proof read prior to signing, occasional typographical errors still can be missed. If any questions arise, please do not hesitate to call for verification.   electronically signed by:  Howard Pouch, DO  [place]

## 2019-05-13 NOTE — Patient Instructions (Signed)
Aortic Valve Stenosis  Aortic valve stenosis is a narrowing of the aortic valve in the heart. The aortic valve opens and closes to regulate blood flow between the left side of the heart (left ventricle) and the artery that leads away from the heart (aorta). When the aortic valve becomes narrow, it is difficult for the heart to pump blood out to the body, which causes the heart to work harder. The extra work can weaken the heart muscle over time. Aortic valve stenosis can range from mild to severe. If it is not treated, it can become more severe over time and lead to heart failure. What are the causes? This condition may be caused by:  Buildup of calcium around and on the aortic valve. This can occur with aging. This is the most common cause of aortic valve stenosis.  A heart problem that developed in the womb (birth defect).  Rheumatic fever.  Radiation to the chest. What increases the risk? You may be more likely to develop this condition if:  You are older than age 41.  You were born with an abnormal bicuspid valve. What are the signs or symptoms? You may not have any symptoms until your condition becomes severe. It may take 10-20 years for mild or moderate aortic valve stenosis to become severe. Symptoms may include:  Shortness of breath. This may get worse during physical activity.  Feeling unusually weak and tired (fatigue).  Extreme discomfort in the chest, neck, or arm during physical activity (angina).  A heartbeat that is irregular or faster than normal (palpitations).  Dizziness or fainting. This may happen when you get physically tired or after you take certain heart medicines, such as nitroglycerin. How is this diagnosed? This condition may be diagnosed with:  A physical exam.  Echocardiogram. This is a type of imaging test that uses sound waves (ultrasound) to make images of your heart. There are two kinds of this test that may be used. ? Transthoracic  echocardiogram (TTE). For this type, a wand-like tool (transducer) is moved over your chest to create ultrasound images that are recorded by a computer. ? Transesophageal echocardiogram (TEE). For this type, a flexible tube (probe) is inserted down the part of the body that moves food from your mouth to your stomach (esophagus). The heart and the esophagus are close to each other. Your health care provider will use the probe to take clear, detailed pictures of the heart.  Cardiac catheterization. For this procedure, a small, thin tube (catheter) is passed through a large vein in your neck, groin, or arm. The catheter is used to get information about arteries, structures, blood pressure, and oxygen levels in your heart.  Stress tests. These are tests that evaluate the blood supply to your heart and your heart's response to exercise. You may work with a health care provider who specializes in the heart (cardiologist) for diagnosis and treatment. How is this treated? Treatment depends on how severe your condition is and what your symptoms are. You will need to have your heart checked regularly to make sure that your condition is not getting worse or causing serious problems. Treatment may also include:  Surgery to replace your aortic valve. This is the most common treatment for aortic valve stenosis, and it is the only treatment to cure the condition. Several types of surgeries are available. The surgery may be done: ? Through a large incision over your heart (open-heart surgery). ? Through small incisions, using a flexible tube called a catheter (  transcatheter aortic valve replacement, TAVR). °· Medicines that help to keep your heart rate regular. °· Medicines that thin your blood (anticoagulants) to prevent blood clots. °· Antibiotic medicines to help prevent infection. °If your condition is mild, you may only need regular follow-up visits for monitoring. °Follow these instructions at  home: °Lifestyle °· Limit alcohol intake to no more than 1 drink a day for nonpregnant women and 2 drinks a day for men. One drink equals 12 oz of beer, 5 oz of wine, or 1½ oz of hard liquor. °· Do not use any products that contain nicotine or tobacco, such as cigarettes and e-cigarettes. If you need help quitting, ask your health care provider. °· Work with your health care provider to manage your blood pressure and cholesterol. °· Maintain a healthy weight. °Eating and drinking ° °· Eat a heart-healthy diet that includes plenty of fresh fruits and vegetables, whole grains, lean protein, and low-fat or nonfat dairy. °· Limit how much caffeine you drink. Caffeine can affect your heart's rate and rhythm. °· Avoid foods that are: °? High in salt (sodium), saturated fat, or sugar. °? Canned or highly processed. °? Fried. °· Follow instructions from your health care provider about any other eating or drinking restrictions. °Activity °· Exercise regularly and return to your normal activities as told by your health care provider. Ask your health care provider what amount and type of physical activity is safe for you. °? If your aortic valve stenosis is mild, you may only need to avoid very intense physical activity, such as heavy weight lifting. °? The more severe your aortic valve stenosis is, the more activities you may need to avoid. °If you are taking blood thinners: °· Before you take any medicines that contain aspirin or NSAIDs, talk with your health care provider. These medicines increase your risk for dangerous bleeding. °· Take your medicine exactly as told, at the same time every day. °· Avoid activities that could cause injury or bruising, and follow instructions about how to prevent falls. °· Wear a medical alert bracelet or carry a card that lists what medicines you take. °General instructions °· Take over-the-counter and prescription medicines only as told by your health care provider. °· If you were  prescribed an antibiotic, take it as told by your health care provider. Do not stop taking the antibiotic even if you start to feel better. °· If you are a woman and you plan to become pregnant, talk with your health care provider before you become pregnant. °· Before you have any type of medical or dental procedure or surgery, tell all health care providers that you have aortic valve stenosis. This may affect the treatment that you receive. °· Keep all follow-up visits as told by your health care provider. This is important. °Contact a health care provider if: °· You have a fever. °Get help right away if: °· You develop any of the following symptoms: °? Chest pain. °? Chest tightness. °? Shortness of breath. °? Trouble breathing. °· You feel light-headed. °· You feel like you might faint. °· Your heartbeat is irregular or faster than normal. °These symptoms may represent a serious problem that is an emergency. Do not wait to see if the symptoms will go away. Get medical help right away. Call your local emergency services (911 in the [place] Do not drive yourself to the hospital. °Summary °· Aortic valve stenosis is a narrowing of the aortic valve in the heart. The aortic valve opens and   closes to regulate blood flow between the left side of the heart (left ventricle) and the artery that leads away from the heart (aorta). °· Aortic valve stenosis can range from mild to severe. If it is not treated, it can become more severe over time and lead to heart failure. °· Treatment depends on how severe your condition is and what your symptoms are. You will need to have your heart checked regularly to make sure that your condition is not getting worse or causing serious problems. °· Exercise regularly and return to your normal activities as told by your health care provider. Ask your health care provider what amount and type of physical activity is safe for you. °This information is not intended to replace advice given to you  by your health care provider. Make sure you discuss any questions you have with your health care provider. °Document Released: 06/01/2003 Document Revised: 08/15/2017 Document Reviewed: 06/05/2017 °Elsevier Patient Education © 2020 [employer] ° °

## 2019-05-14 ENCOUNTER — Telehealth: Payer: Self-pay | Admitting: Family Medicine

## 2019-05-14 NOTE — Telephone Encounter (Signed)
Please inform patient the following information: His potassium is back to his normal level. Continue at same dose. His thyroid panel is also back to normal level>> no need for further workup or medications.  I did end up referring to cardiology in place of order the other test we spoke of. Its is the better option with scheduling non-urgent test such as this during pandemic and also since he would need at least an evaluation of the aortic valve with cardiology- this was the best option. They should be receiving a call from that office to schedule.   Lastly, during his physical coming up- we will need to discuss the risk vs benefit of him continuing on the Celebrex given his recent TIA. Celebrex does have a warning label of increase cardiovascular events (stroke/MI) and not recommended for patients with history of cardiovascular events.

## 2019-05-14 NOTE — Telephone Encounter (Signed)
Pt was called and given information. He verbalized understanding and agreed to plan.

## 2019-05-14 NOTE — Telephone Encounter (Signed)
Pt was called and VM was left to return call  °

## 2019-06-07 LAB — HM DIABETES FOOT EXAM: HM Diabetic Foot Exam: NORMAL

## 2019-06-09 ENCOUNTER — Ambulatory Visit (INDEPENDENT_AMBULATORY_CARE_PROVIDER_SITE_OTHER): Payer: PPO | Admitting: Cardiology

## 2019-06-09 ENCOUNTER — Other Ambulatory Visit: Payer: Self-pay

## 2019-06-09 ENCOUNTER — Encounter: Payer: Self-pay | Admitting: Cardiology

## 2019-06-09 VITALS — BP 142/52 | HR 62 | Temp 97.4°F | Ht 68.0 in | Wt 183.0 lb

## 2019-06-09 DIAGNOSIS — G459 Transient cerebral ischemic attack, unspecified: Secondary | ICD-10-CM | POA: Diagnosis not present

## 2019-06-09 DIAGNOSIS — I10 Essential (primary) hypertension: Secondary | ICD-10-CM | POA: Diagnosis not present

## 2019-06-09 DIAGNOSIS — E785 Hyperlipidemia, unspecified: Secondary | ICD-10-CM | POA: Diagnosis not present

## 2019-06-09 DIAGNOSIS — I350 Nonrheumatic aortic (valve) stenosis: Secondary | ICD-10-CM | POA: Diagnosis not present

## 2019-06-09 NOTE — Progress Notes (Signed)
Patient referred for echo bubble study and asked by Dr. Docia Furl to have it performed at [facility] office because physician must be present for at least 1 hour. Patient declined at this time and Dr. Docia Furl made aware.
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
## 2019-06-09 NOTE — Addendum Note (Signed)
Addended by: Beckey Rutter on: 06/09/2019 09:53 AM   Modules accepted: Orders

## 2019-06-09 NOTE — Patient Instructions (Addendum)
Medication Instructions:  Your physician recommends that you continue on your current medications as directed. Please refer to the Current Medication list given to you today.  If you need a refill on your cardiac medications before your next appointment, please call your pharmacy.   Lab work: NONE If you have labs (blood work) drawn today and your tests are completely normal, you will receive your results only by: Marland Kitchen MyChart Message (if you have MyChart) OR . A paper copy in the mail If you have any lab test that is abnormal or we need to change your treatment, we will call you to review the results.  Testing/Procedures:  You have been referred for a bubble study echo.  Follow-Up: At [facility], you and your health needs are our priority.  As part of our continuing mission to provide you with exceptional heart care, we have created designated Provider Care Teams.  These Care Teams include your primary Cardiologist (physician) and Advanced Practice Providers (APPs -  Physician Assistants and Nurse Practitioners) who all work together to provide you with the care you need, when you need it. You will need a follow up appointment in 6 months.

## 2019-06-28 ENCOUNTER — Ambulatory Visit: Payer: PPO | Admitting: Family Medicine

## 2019-06-30 NOTE — Progress Notes (Signed)
Records received from [employer]. In home visit completed on 06/07/2019.

## 2019-07-01 ENCOUNTER — Encounter: Payer: Self-pay | Admitting: Family Medicine

## 2019-07-01 ENCOUNTER — Other Ambulatory Visit: Payer: Self-pay

## 2019-07-01 ENCOUNTER — Ambulatory Visit (INDEPENDENT_AMBULATORY_CARE_PROVIDER_SITE_OTHER): Payer: PPO | Admitting: Family Medicine

## 2019-07-01 VITALS — BP 142/74 | HR 67 | Temp 97.6°F | Resp 18 | Ht 68.0 in | Wt 181.2 lb

## 2019-07-01 DIAGNOSIS — M8949 Other hypertrophic osteoarthropathy, multiple sites: Secondary | ICD-10-CM

## 2019-07-01 DIAGNOSIS — I350 Nonrheumatic aortic (valve) stenosis: Secondary | ICD-10-CM

## 2019-07-01 DIAGNOSIS — N400 Enlarged prostate without lower urinary tract symptoms: Secondary | ICD-10-CM

## 2019-07-01 DIAGNOSIS — R053 Chronic cough: Secondary | ICD-10-CM

## 2019-07-01 DIAGNOSIS — I10 Essential (primary) hypertension: Secondary | ICD-10-CM

## 2019-07-01 DIAGNOSIS — I351 Nonrheumatic aortic (valve) insufficiency: Secondary | ICD-10-CM | POA: Diagnosis not present

## 2019-07-01 DIAGNOSIS — E785 Hyperlipidemia, unspecified: Secondary | ICD-10-CM

## 2019-07-01 DIAGNOSIS — M159 Polyosteoarthritis, unspecified: Secondary | ICD-10-CM

## 2019-07-01 DIAGNOSIS — R05 Cough: Secondary | ICD-10-CM

## 2019-07-01 DIAGNOSIS — R6889 Other general symptoms and signs: Secondary | ICD-10-CM

## 2019-07-01 DIAGNOSIS — E663 Overweight: Secondary | ICD-10-CM | POA: Diagnosis not present

## 2019-07-01 DIAGNOSIS — I359 Nonrheumatic aortic valve disorder, unspecified: Secondary | ICD-10-CM

## 2019-07-01 MED ORDER — POTASSIUM CITRATE ER 10 MEQ (1080 MG) PO TBCR: EXTENDED_RELEASE_TABLET | ORAL | 0 refills | Status: DC

## 2019-07-01 MED ORDER — CELECOXIB 200 MG PO CAPS: 200.0000 mg | ORAL_CAPSULE | Freq: Every day | ORAL | 1 refills | Status: DC

## 2019-07-01 MED ORDER — BENAZEPRIL HCL 20 MG PO TABS: 20.0000 mg | ORAL_TABLET | Freq: Every day | ORAL | 1 refills | Status: DC

## 2019-07-01 MED ORDER — TAMSULOSIN HCL 0.4 MG PO CAPS: ORAL_CAPSULE | ORAL | 0 refills | Status: DC

## 2019-07-01 MED ORDER — SIMVASTATIN 40 MG PO TABS: 40.0000 mg | ORAL_TABLET | Freq: Every day | ORAL | 1 refills | Status: DC

## 2019-07-01 MED ORDER — GABAPENTIN 600 MG PO TABS: 600.0000 mg | ORAL_TABLET | Freq: Every day | ORAL | 1 refills | Status: DC

## 2019-07-01 MED ORDER — IPRATROPIUM BROMIDE 0.03 % NA SOLN: 2.0000 | Freq: Two times a day (BID) | NASAL | 6 refills | Status: DC

## 2019-07-01 NOTE — Progress Notes (Signed)
SUBJECTIVE  Patient Care Team    Relationship Specialty Notifications Start End  Ma Hillock, DO PCP - General Family Medicine  01/19/19     Chief Complaint  Patient presents with  . Hypertension    Pt is doing well with no complaints. Pt has not taken BP medication this AM. Last dose was at 0530 yesterday morning.   . Allergies    Pt is taking allergy medications daily and still continues to cough all night. Pt is sleeping in recliner nightly.     HPI: [name] [name] is a 83 y.o. male present for chronic medical condition follow-up with acute concerns. Hypertension/hyperlipidemia: Pt reports compliance with Benzapril/HCTZ 20-12.5 mg daily, Zocor 40 mg nightly. Blood pressures ranges at home in normal limits. Patient denies chest pain, shortness of breath, dizziness or lower extremity edema.   Pt takes a daily baby ASA. Pt is  prescribed statin. BMP:04/09/2018 within normal limits-creatinine 0.75, GFR 106 CBC: 04/09/2018 mildly low hematocrit at 37.7 otherwise normal. TSH: 04/09/2018 within normal limits Lipids: 04/09/2018 total cholesterol 137, HDL 54, LDL 71, triglycerides 54 Diet: Monitors his diet Exercise: Routine exercise RF: Hypertension, hyperlipidemia, family history of heart disease and stroke  BPH/arthritis/neuropathy: Patient has a history of arthritis status post shoulder replacement and knee arthroplasty.  He also has a history of BPH.  He states he takes Celebrex 200 mg daily to help with his arthritis and it actually works better for his BPH than any of the other medications tried.  He sustained a lower extremity fracture injury in which she has chronic neuropathy.  Gabapentin helps with his discomfort surrounding neuropathy.  He has no complaints about these medications today.  PAD screening: PAD screening completed by [facility].  Digital ABI resulted with severe right foot at 0.17 and normal left foot.  Patient has callus over his left large toe which  likely created discrepancies.  He denies any symptoms associated with PAD.  PSA is monitored routinely through urology.  Chronic cough: Patient reports he has had a chronic cough for many years.  His wife states that he is coughing more through the night and she would like him to discuss this today.  He reports he takes Claritin 10 mg daily at bedtime.  He does not use any other allergy medications or nasal sprays.  He does not have a history of reflux.  He does have a history of allergic rhinitis.  He also feels if he lays on his side his symptoms are improved.  He has been on Benzapril for many years.  ROS: See pertinent positives and negatives per HPI.  Patient Active Problem List   Diagnosis Date Noted  . Abnormal ankle brachial index (ABI) 07/05/2019  . Abnormal echocardiogram 05/13/2019  . LAE (left atrial enlargement) 05/13/2019  . Aortic valve calcification 05/13/2019  . Aortic valve regurgitation 05/13/2019  . Aortic valve stenosis 05/13/2019  . TIA (transient ischemic attack) 05/13/2019  . Elevated TSH 05/13/2019  . Visual field defect 04/29/2019  . Chronic pain 04/29/2019  . Neuropathy- chronic s/p trauma 01/20/2019  . Overweight (BMI 25.0-29.9) 01/20/2019  . Sensorineural hearing loss (SNHL), bilateral 06/14/2016  . Impaired glucose tolerance 04/01/2016  . Essential hypertension 06/02/2008  . Dyslipidemia 04/23/2007  . Allergic rhinitis 04/23/2007  . BPH (benign prostatic hyperplasia) 04/23/2007    Class: Present on Admission  . Osteoarthritis 04/23/2007    Social History   Tobacco Use  . Smoking status: Never Smoker  . Smokeless tobacco:  Never Used  Substance Use Topics  . Alcohol use: No    Current Outpatient Medications:  .  celecoxib (CELEBREX) 200 MG capsule, Take 1 capsule (200 mg total) by mouth at bedtime., Disp: 90 capsule, Rfl: 1 .  Coenzyme Q10 (COQ10) 200 MG CAPS, Take 200 mg by mouth at bedtime., Disp: , Rfl:  .  fluticasone (FLONASE) 50 MCG/ACT  nasal spray, Place 1 spray into both nostrils daily. (Patient taking differently: Place 1 spray into both nostrils daily as needed for allergies or rhinitis. ), Disp: 32 g, Rfl: 3 .  gabapentin (NEURONTIN) 600 MG tablet, Take 1 tablet (600 mg total) by mouth at bedtime., Disp: 90 tablet, Rfl: 1 .  hydroxypropyl methylcellulose / hypromellose (ISOPTO TEARS / GONIOVISC) 2.5 % ophthalmic solution, Place 1 drop into both eyes as needed for dry eyes., Disp: , Rfl:  .  Krill Oil 1000 MG CAPS, Take 2,000 mg by mouth daily with breakfast., Disp: , Rfl:  .  loratadine (CLARITIN) 10 MG tablet, Take 10 mg by mouth at bedtime. , Disp: , Rfl:  .  Multiple Vitamin (MULTIVITAMIN) tablet, Take 1 tablet by mouth daily., Disp: , Rfl:  .  NON FORMULARY, Take 1 capsule by mouth See admin instructions. MedOp MaxiVision Whole Body Formula capsules: Take 1 capsule by mouth two times a day, Disp: , Rfl:  .  NON FORMULARY, Take 1 packet by mouth See admin instructions. Relief Factor: Dissolve 1 packet into preferred beverage and drink two times a day, Disp: , Rfl:  .  NON FORMULARY, Take 2 capsules by mouth See admin instructions. Clear Lungs capsules: Take 2 capsules by mouth in the morning, Disp: , Rfl:  .  potassium citrate (UROCIT-K) 10 MEQ (1080 MG) SR tablet, TAKE 2 TABLETS BY MOUTH  TWICE DAILY WITH A MEAL, Disp: 360 tablet, Rfl: 0 .  Psyllium (METAMUCIL FIBER PO), Take 2 capsules by mouth daily with breakfast., Disp: , Rfl:  .  simvastatin (ZOCOR) 40 MG tablet, Take 1 tablet (40 mg total) by mouth at bedtime., Disp: 90 tablet, Rfl: 1 .  benazepril (LOTENSIN) 20 MG tablet, Take 1 tablet (20 mg total) by mouth daily., Disp: 90 tablet, Rfl: 1 .  ipratropium (ATROVENT) 0.03 % nasal spray, Place 2 sprays into both nostrils every 12 (twelve) hours., Disp: 30 mL, Rfl: 6 .  tamsulosin (FLOMAX) 0.4 MG CAPS capsule, 1 tab daily., Disp: 90 capsule, Rfl: 0 .  vitamin C (ASCORBIC ACID) 500 MG tablet, Take 1,500 mg by mouth at  bedtime., Disp: , Rfl:   No Known Allergies  OBJECTIVE: BP (!) 142/74 (BP Location: Left Arm, Patient Position: Sitting, Cuff Size: Normal)   Pulse 67   Temp 97.6 F (36.4 C) (Temporal)   Resp 18   Ht 5\' 8"  (1.727 m)   Wt 181 lb 4 oz (82.2 kg)   SpO2 98%   BMI 27.56 kg/m  Gen: Afebrile. No acute distress.  Nontoxic, pleasant, Caucasian male.  Clear for weight. HENT: AT. [place].  Eyes:Pupils Equal Round Reactive to light, Extraocular movements intact,  Conjunctiva without redness, discharge or icterus. Neck/lymp/endocrine: Supple, no lymphadenopathy, no thyromegaly CV: RRR no murmur, no edema, +2/4 P posterior tibialis pulses Chest: CTAB, no wheeze or crackles Abd: Soft. NTND. BS present.  No masses palpated.  Skin: No rashes, purpura or petechiae.  Neuro/SK:  Normal gait. PERLA. EOMi. Alert. Oriented x3.  Normal capillary refill bilateral feet/toes.  Normal sensation/unchanged.  Callus formation left medial toe. Psych: Normal affect,  dress and demeanor. Normal speech. Normal thought content and judgment.  ASSESSMENT AND PLAN: YENG MORATAYA is a 83 y.o. male present for TOC.  Benign prostatic hyperplasia without lower urinary tract symptoms -[place].  Discussed Celebrex and black box warning.  He is wanting to try different medication today if possible.  We will start on Flomax 0.4 mg daily.  He will call in in 4 weeks if not working well enough and will increase dose at that time. -If not working for him, he will discontinue the Flomax and restart the Celebrex in which she was provided prescription for today as well.  Dyslipidemia/Essential hypertension/Overweight (BMI 25.0-29.9) - above goal and BPH sx with cough>>> DC benzapril/hctz-was only taking half tab.  Start Benzapril 20 mg 1 tab daily. -Of note: If chronic cough continues will need to consider discontinuing ACE inhibitor and trying amlodipine. - Continue Zocor 40 mg daily. - continue baby aspirin - labs due next visit.   - low sodium diet and routine exercise.  Follow-up 6 months, sooner if blood pressure above goal.  Neuropathy- chronic s/p trauma - stable Status post trauma to lower extremity. - continue gabapentin 600 mg qhs-refills provided today.  Primary osteoarthritis involving multiple joints - going to try off celebrex if able 2/2 to black box warning.  - start tylenol.  - If arthritis pain or BPH is not controlled can restart Celebrex and discontinue the Flomax if needed.  Chronic cough: Stop Claritin.  Start Allegra 180 mg daily OTC. -Start Atrovent nasal spray nightly. -Continue Flonase. Follow-up as needed.  If cough remains with need to consider GERD versus Benzapril as possible causes.  Abnormal ABI: Discussed results with him today.  His exam is normal with the exception of callus formation on his left large toe which possibly was the cause of his decreased reading.  Discussed signs and symptoms for him to monitor for decreased circulation.  At this time he would like to wait on any further testing.   Follow-up in 6 months provider appointment on chronic medical conditions labs will be due at that time-patient was encouraged to fast  > 25 minutes spent with patient, >50% of time spent face to face counseling and coordinating care.     [name] Pouch, DO 07/05/2019

## 2019-07-01 NOTE — Patient Instructions (Addendum)
1. If you notice any changes in your toe/foot and desire the vascular studies please make appt and we will get those ordered for you. 2. Start flomax daily. In 2 weeks stop the celebrex. Call in to let us know how it is working in 4 weeks from start of flomax. If working will refill for you, if not working well will increase dose. If increased dose dose not work after 4 weeks, then you can stop and restart celebrex.  3. BP: stop the combo med. Start benazepril 20 mg tab - 1 full tab once a day .  4.cough: stop Claritin, start OTC allegra 180 mg (24 hr tab). Start the new nasal spray- atrovent 2 sprays before bed.    Follow up 6 months- they will call you to schedule.

## 2019-07-05 ENCOUNTER — Encounter: Payer: Self-pay | Admitting: Family Medicine

## 2019-07-05 DIAGNOSIS — R6889 Other general symptoms and signs: Secondary | ICD-10-CM | POA: Insufficient documentation

## 2019-07-05 DIAGNOSIS — R05 Cough: Secondary | ICD-10-CM | POA: Insufficient documentation

## 2019-07-05 DIAGNOSIS — R053 Chronic cough: Secondary | ICD-10-CM | POA: Insufficient documentation

## 2019-07-05 HISTORY — DX: Chronic cough: R05.3

## 2019-07-05 HISTORY — DX: Other general symptoms and signs: R68.89

## 2019-07-12 DIAGNOSIS — H5203 Hypermetropia, bilateral: Secondary | ICD-10-CM | POA: Diagnosis not present

## 2019-07-12 DIAGNOSIS — H35033 Hypertensive retinopathy, bilateral: Secondary | ICD-10-CM | POA: Diagnosis not present

## 2019-07-19 ENCOUNTER — Encounter: Payer: Self-pay | Admitting: Family Medicine

## 2019-07-20 NOTE — Telephone Encounter (Signed)
Please advise any recommendations for patient.

## 2019-07-20 NOTE — Telephone Encounter (Signed)
Called patient and he verbalized understanding to stop the flomax and just take the celebrex only

## 2019-07-20 NOTE — Telephone Encounter (Signed)
Sounds like he gave it a good try. He can return to the Celebrex only. That medication was also refilled for him at his last appt with refills, so he should not need any additional refills.  Please DC the flomax at his pharmacy

## 2019-08-24 ENCOUNTER — Other Ambulatory Visit: Payer: Self-pay | Admitting: Family Medicine

## 2019-08-24 DIAGNOSIS — I10 Essential (primary) hypertension: Secondary | ICD-10-CM

## 2019-08-27 ENCOUNTER — Telehealth: Payer: Self-pay | Admitting: Family Medicine

## 2019-08-27 NOTE — Telephone Encounter (Signed)
Spoke with patient and let him know that he was given a 3 month supply with 1 refill. Patient stated he will contact his pharmacy.

## 2019-08-27 NOTE — Telephone Encounter (Signed)
Patient needs refill of simvastatin (ZOCOR) 40 MG tablet sent to [facility].

## 2019-09-16 DIAGNOSIS — M5416 Radiculopathy, lumbar region: Secondary | ICD-10-CM | POA: Diagnosis not present

## 2019-09-16 DIAGNOSIS — M1711 Unilateral primary osteoarthritis, right knee: Secondary | ICD-10-CM | POA: Diagnosis not present

## 2019-10-08 ENCOUNTER — Telehealth: Payer: Self-pay | Admitting: Cardiology

## 2019-10-08 NOTE — Telephone Encounter (Signed)
I spoke with patient's wife who reports patient's BP has been elevated today as outlined below. Also having feeling of skipped beats when sitting down. Does not feel skipped beats when moving around. Previous phone note from today indicates patient to be seen in [place] office next week. Wife reports appointment has been scheduled for February 2nd.  I told her I would send message to Dr Geraldo Pitter to clarify timing of appointment.

## 2019-10-08 NOTE — Telephone Encounter (Signed)
Spoke to pt, pt was on phone and will call back. Dr. Geraldo Pitter wanted him to be seen next week in [place] since he is having symptoms.

## 2019-10-08 NOTE — Telephone Encounter (Signed)
New Message  Patient c/o Palpitations:  High priority if patient c/o lightheadedness, shortness of breath, or chest pain  1) How long have you had palpitations/irregular HR/ Afib? Are you having the symptoms now? 2 days  2) Are you currently experiencing lightheadedness, SOB or CP? No  3) Do you have a history of afib (atrial fibrillation) or irregular heart rhythm? No  4) Have you checked your BP or HR? (document readings if available): 176/67; 75; 179/61; 77  5) Are you experiencing any other symptoms? No

## 2019-10-09 DIAGNOSIS — I10 Essential (primary) hypertension: Secondary | ICD-10-CM | POA: Diagnosis not present

## 2019-10-09 DIAGNOSIS — R509 Fever, unspecified: Secondary | ICD-10-CM | POA: Diagnosis not present

## 2019-10-09 DIAGNOSIS — Z20822 Contact with and (suspected) exposure to covid-19: Secondary | ICD-10-CM | POA: Diagnosis not present

## 2019-10-11 ENCOUNTER — Telehealth: Payer: Self-pay

## 2019-10-11 NOTE — Telephone Encounter (Signed)
Patient states his BP today is 148/62 HR 63. Patient states he is not having any chest pain but he can feel the flutter of irregular heartbeat. No edema and no SOB. Patient states he was running a fever but tested neg for covid on 10/10/19. He currently scheduled to see Dr. Docia Furl on 10/13/19.

## 2019-10-11 NOTE — Telephone Encounter (Signed)
Left message for patient to call office with update on his symptoms.

## 2019-10-11 NOTE — Telephone Encounter (Signed)
Patient Name: Raymund Emminger Gender: Male DOB: [birth year] Age: 84 Y 25 D Return Phone Number: [phone] (Primary), [phone] (Secondary) Address: City/State/Zip: [place] [address] Client [facility] - Client Client Site [place] - Day Physician Raoul Pitch, [place] Contact Type Call Who Is Calling Patient / Member / Family / Caregiver Call Type Triage / Clinical Caller Name Daljit Bridewell Relationship To Patient Spouse Return Phone Number [phone] (Primary) Chief Complaint Heart palpitations or irregular heartbeat Reason for Call Symptomatic / Request for [place] states her husband started having a fever today low grade of 99 yesterday evening it was 101. Caller states today his blood pressure keeps going higher 172/73 and her husband is on blood pressure medication. Caller states her husband is having heart palpitations. Translation No Nurse Assessment Nurse: Zorita Pang, RN, Deborah Date/Time (Eastern Time): 10/08/2019 3:30:46 PM Confirm and document reason for call. If symptomatic, describe symptoms. ---The caller states that her husband has had a fever. The caller is more concerned about his blood pressure which is 181/61. She states that his fever yesterday was 99-101.4. No other symptoms. She has a new temporal thermometer. He states that he is having heart palpations. Has the patient had close contact with a person known or suspected to have the novel coronavirus illness OR traveled / lives in area with major community spread (including international travel) in the last 14 days from the onset of symptoms? * If Asymptomatic, screen for exposure and travel within the last 14 days. ---No Does the patient have any new or worsening symptoms? ---Yes Will a triage be completed? ---Yes Related visit to physician within the last 2 weeks? ---No Does the PT have any chronic conditions? (i.e.  diabetes, asthma, this includes High risk factors for pregnancy, etc.) ---Yes List chronic conditions. ---hypertension Is this a behavioral health or substance abuse call? ---No PLEASE NOTE: All timestamps contained within this report are represented as [place] Standard Time. CONFIDENTIALTY NOTICE: This fax transmission is intended only for the addressee. It contains information that is legally privileged, confidential or otherwise protected from use or disclosure. If you are not the intended recipient, you are strictly prohibited from reviewing, disclosing, copying using or disseminating any of this information or taking any action in reliance on or regarding this information. If you have received this fax in error, please notify us immediately by telephone so that we can arrange for its return to [place]. Phone: [phone], Toll-Free: [phone], Fax: [phone] Page: 2 of 2 Call Id: [id number] Guidelines Guideline Title Affirmed Question Affirmed Notes Nurse Date/Time Eilene Ghazi Time) High Blood Pressure Systolic BP >= 99991111 OR Diastolic >= [id number] Womble, RN, Neoma Laming 10/08/2019 3:42:56 PM Disp. Time Eilene Ghazi Time) Disposition Final User 10/08/2019 3:45:36 PM See PCP within 24 Hours Yes Zorita Pang, RN, Garrel Ridgel Disagree/Comply Comply Caller Understands Yes PreDisposition Call Doctor Care Advice Given Per Guideline SEE PCP WITHIN 24 HOURS: * Chest pain or difficulty breathing occurs * Difficulty walking, difficulty talking, or severe headache occurs Referrals REFERRED TO PCP OFFICE   Pt was called and stated He went to urgent care and had COVID test and spouse, both Negative. He states they swabbed him 2x and both was neg. Stopped running fever Sat evening and has not had one since. Denies SOB, congestion, and heart palpitations. Pt states he was having chest pains/Palpitations when he did call but feels better now. BP this AM 145/60 and then 135/60. Pt takes BP meds in the AM and  was advised  to wait two hours before checking BP. He was advised if he had chest pain, SOB, arm back or jaw pain to go to ED. Pt says he has cardiology appt Wednesday and will wait to be seen at that time. He was encouraged to call and let them know what happened, he agreed.

## 2019-10-11 NOTE — Telephone Encounter (Signed)
I think that should be fine.  Please call patient and get details as to how the patient is doing now

## 2019-10-12 ENCOUNTER — Encounter (HOSPITAL_BASED_OUTPATIENT_CLINIC_OR_DEPARTMENT_OTHER): Payer: Self-pay | Admitting: *Deleted

## 2019-10-12 ENCOUNTER — Telehealth: Payer: Self-pay | Admitting: Cardiology

## 2019-10-12 ENCOUNTER — Emergency Department (HOSPITAL_BASED_OUTPATIENT_CLINIC_OR_DEPARTMENT_OTHER)
Admission: EM | Admit: 2019-10-12 | Discharge: 2019-10-12 | Disposition: A | Discharge status: Home / Self Care | Payer: PPO | Attending: Emergency Medicine | Admitting: Emergency Medicine

## 2019-10-12 ENCOUNTER — Emergency Department (HOSPITAL_BASED_OUTPATIENT_CLINIC_OR_DEPARTMENT_OTHER): Payer: PPO

## 2019-10-12 ENCOUNTER — Other Ambulatory Visit: Payer: Self-pay

## 2019-10-12 DIAGNOSIS — R079 Chest pain, unspecified: Secondary | ICD-10-CM | POA: Diagnosis not present

## 2019-10-12 DIAGNOSIS — I10 Essential (primary) hypertension: Secondary | ICD-10-CM | POA: Insufficient documentation

## 2019-10-12 DIAGNOSIS — Z8673 Personal history of transient ischemic attack (TIA), and cerebral infarction without residual deficits: Secondary | ICD-10-CM | POA: Diagnosis not present

## 2019-10-12 DIAGNOSIS — R002 Palpitations: Secondary | ICD-10-CM | POA: Insufficient documentation

## 2019-10-12 DIAGNOSIS — Z20822 Contact with and (suspected) exposure to covid-19: Secondary | ICD-10-CM | POA: Insufficient documentation

## 2019-10-12 DIAGNOSIS — R0789 Other chest pain: Secondary | ICD-10-CM | POA: Diagnosis not present

## 2019-10-12 LAB — CBC WITH DIFFERENTIAL/PLATELET
Abs Immature Granulocytes: 0.04 10*3/uL (ref 0.00–0.07)
Basophils Absolute: 0 10*3/uL (ref 0.0–0.1)
Basophils Relative: 0 %
Eosinophils Absolute: 0.2 10*3/uL (ref 0.0–0.5)
Eosinophils Relative: 3 %
HCT: 41.1 % (ref 39.0–52.0)
Hemoglobin: 13.7 g/dL (ref 13.0–17.0)
Immature Granulocytes: 0 %
Lymphocytes Relative: 13 %
Lymphs Abs: 1.3 10*3/uL (ref 0.7–4.0)
MCH: 31.2 pg (ref 26.0–34.0)
MCHC: 33.3 g/dL (ref 30.0–36.0)
MCV: 93.6 fL (ref 80.0–100.0)
Monocytes Absolute: 0.6 10*3/uL (ref 0.1–1.0)
Monocytes Relative: 6 %
Neutro Abs: 7.4 10*3/uL (ref 1.7–7.7)
Neutrophils Relative %: 78 %
Platelets: 220 10*3/uL (ref 150–400)
RBC: 4.39 MIL/uL (ref 4.22–5.81)
RDW: 12.7 % (ref 11.5–15.5)
WBC: 9.6 10*3/uL (ref 4.0–10.5)
nRBC: 0 % (ref 0.0–0.2)

## 2019-10-12 LAB — COMPREHENSIVE METABOLIC PANEL
ALT: 18 U/L (ref 0–44)
AST: 19 U/L (ref 15–41)
Albumin: 4.3 g/dL (ref 3.5–5.0)
Alkaline Phosphatase: 56 U/L (ref 38–126)
Anion gap: 9 (ref 5–15)
BUN: 36 mg/dL — ABNORMAL HIGH (ref 8–23)
CO2: 24 mmol/L (ref 22–32)
Calcium: 9.4 mg/dL (ref 8.9–10.3)
Chloride: 105 mmol/L (ref 98–111)
Creatinine, Ser: 0.8 mg/dL (ref 0.61–1.24)
GFR calc Af Amer: >60 mL/min (ref >60)
GFR calc non Af Amer: >60 mL/min (ref >60)
Glucose, Bld: 107 mg/dL — ABNORMAL HIGH (ref 70–99)
Potassium: 4.1 mmol/L (ref 3.5–5.1)
Sodium: 138 mmol/L (ref 135–145)
Total Bilirubin: 0.6 mg/dL (ref 0.3–1.2)
Total Protein: 7.2 g/dL (ref 6.5–8.1)

## 2019-10-12 LAB — URINALYSIS, ROUTINE W REFLEX MICROSCOPIC
Bilirubin Urine: NEGATIVE
Glucose, UA: NEGATIVE mg/dL
Hgb urine dipstick: NEGATIVE
Ketones, ur: 15 mg/dL — AB
Leukocytes,Ua: NEGATIVE
Nitrite: NEGATIVE
Protein, ur: NEGATIVE mg/dL
Specific Gravity, Urine: 1.02 (ref 1.005–1.030)
pH: 5.5 (ref 5.0–8.0)

## 2019-10-12 LAB — TROPONIN I (HIGH SENSITIVITY): Troponin I (High Sensitivity): 5 ng/L (ref <18)

## 2019-10-12 LAB — SARS CORONAVIRUS 2 BY RT PCR (HOSPITAL ORDER, PERFORMED IN ~~LOC~~ HOSPITAL LAB): SARS Coronavirus 2: NEGATIVE

## 2019-10-12 NOTE — ED Provider Notes (Signed)
Emergency Department Provider Note   I have reviewed the triage vital signs and the nursing notes.   HISTORY  Chief Complaint Chest Pain   HPI [name] [name] is a 84 y.o. male with PMH of HLD and HLD presents to the emergency department for evaluation of left chest fluttering with occasional discomfort in the shoulder over the past 5 days.  He reports subjective fever 3 days ago but nothing since.  He denies any congestion, shortness of breath, or current chest pain/fluttering symptoms.  He has an appointment with his cardiologist tomorrow but called the office who asked that he be evaluated in the ED. Denies GI symptoms. Patient with recent COVID test which was negative. He notes checking his BP at home with intermittent elevated readings to the SBP 180s.   Past Medical History:  Diagnosis Date  . ALLERGIC RHINITIS 04/23/2007  . BENIGN PROSTATIC HYPERTROPHY 04/23/2007   resolved after TURP  . Cellulitis june 2013   left side("left flank")  . CHEST PAIN 02/04/2008  . H/O blurred vision    both eyes, occasional  . Headache   . HYPERLIPIDEMIA 04/23/2007  . HYPERTENSION 06/02/2008  . NEPHROLITHIASIS, HX OF 04/23/2007   lithotripsy- fragment remains, no symptoms   . 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 Neuropathy of left foot   . OSTEOARTHRITIS 04/23/2007  . Palpitations 02/04/2008   Qualifier: Diagnosis of  By: Burnice Logan  MD, Doretha Sou   . Pneumonia   . PONV (postoperative nausea and vomiting)   . RECTAL BLEEDING 02/04/2008   no recent problems  . 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 Abnormal ankle brachial index (ABI) 07/05/2019  . Chronic cough 07/05/2019  . Abnormal echocardiogram 05/13/2019  . LAE (left atrial enlargement) 05/13/2019  . Aortic valve calcification 05/13/2019  . Aortic valve  regurgitation 05/13/2019  . Aortic valve stenosis 05/13/2019  . TIA (transient ischemic attack) 05/13/2019  . Elevated TSH 05/13/2019  . Visual field defect 04/29/2019  . Chronic pain 04/29/2019  . Neuropathy- chronic s/p trauma 01/20/2019  . Overweight (BMI 25.0-29.9) 01/20/2019  . Sensorineural hearing loss (SNHL), bilateral 06/14/2016  . Impaired glucose tolerance 04/01/2016  . Essential hypertension 06/02/2008  . Dyslipidemia 04/23/2007  . Allergic rhinitis 04/23/2007  . BPH (benign prostatic hyperplasia) 04/23/2007    Class: Present on Admission  . Osteoarthritis 04/23/2007    Past Surgical History:  Procedure Laterality Date  . APPENDECTOMY    . COLONOSCOPY    . HERNIA REPAIR    . left foot surgery for fx  Jul 30, 2011  . LITHOTRIPSY  1992   cystoscopy with stent placement also done(1 stone fragment remains)  . right shoulder replacement  Sep 28, 2009  . TOTAL KNEE ARTHROPLASTY Right 08/30/2013   Procedure: RIGHT TOTAL KNEE ARTHROPLASTY RIGHT ;  Surgeon: Gearlean Alf, MD;  Location: [facility];  Service: Orthopedics;  Laterality: Right;  . TOTAL SHOULDER ARTHROPLASTY Left 02/02/2015   Procedure: LEFT TOTAL SHOULDER ARTHROPLASTY;  Surgeon: Justice Britain, MD;  Location: [facility];  Service: Orthopedics;  Laterality: Left;  . TRANSURETHRAL RESECTION OF PROSTATE  03/27/2012   Procedure: TRANSURETHRAL RESECTION OF THE PROSTATE WITH GYRUS INSTRUMENTS;  Surgeon: Claybon Jabs, MD;  Location: [facility];  Service: Urology;;    Allergies Patient has no  known allergies.  Family History  Problem Relation Age of Onset  . Heart disease Mother 15       MI  . Stroke Father 41  . COPD Sister   . Diabetes Sister   . Diabetes Brother     Social History Social History   Tobacco Use  . Smoking status: Never Smoker  . Smokeless tobacco: Never Used  Substance Use Topics  . Alcohol use: No  . Drug use: No    Review of Systems  Constitutional: Positive fever 3 days prior.  Eyes: No  visual changes. ENT: No sore throat. Cardiovascular: Positive chest pain/fluttering.  Respiratory: Denies shortness of breath. Gastrointestinal: No abdominal pain.  No nausea, no vomiting.  No diarrhea.  No constipation. Genitourinary: Negative for dysuria. Musculoskeletal: Negative for back pain. Skin: Negative for rash. Neurological: Negative for headaches, focal weakness or numbness.  10-point ROS otherwise negative.  ____________________________________________   PHYSICAL EXAM:  VITAL SIGNS: ED Triage Vitals  Enc Vitals Group     BP 10/12/19 1216 137/83     Pulse Rate 10/12/19 1216 82     Resp 10/12/19 1216 18     Temp 10/12/19 1216 97.9 F (36.6 C)     Temp Source 10/12/19 1216 Oral     SpO2 10/12/19 1216 99 %     Weight 10/12/19 1213 180 lb (81.6 kg)     Height 10/12/19 1213 5\' 9"  (1.753 m)   Constitutional: Alert and oriented. Well appearing and in no acute distress. Eyes: Conjunctivae are normal. Head: Atraumatic. Nose: No congestion/rhinnorhea. Mouth/Throat: Mucous membranes are moist.  Neck: No stridor.  Cardiovascular: Normal rate, regular rhythm. Good peripheral circulation. Grossly normal heart sounds.   Respiratory: Normal respiratory effort.  No retractions. Lungs CTAB. Gastrointestinal: Soft and nontender. No distention.  Musculoskeletal: No lower extremity tenderness nor edema. No gross deformities of extremities. Neurologic:  Normal speech and language.  Skin:  Skin is warm, dry and intact. No rash noted.   ____________________________________________   LABS (all labs ordered are listed, but only abnormal results are displayed)  Labs Reviewed  COMPREHENSIVE METABOLIC PANEL - Abnormal; Notable for the following components:      Result Value   Glucose, Bld 107 (*)    BUN 36 (*)    All other components within normal limits  URINALYSIS, ROUTINE W REFLEX MICROSCOPIC - Abnormal; Notable for the following components:   Ketones, ur 15 (*)    All  other components within normal limits  SARS CORONAVIRUS 2 BY RT PCR (HOSPITAL ORDER, [facility])  URINE CULTURE  CBC WITH DIFFERENTIAL/PLATELET  TROPONIN I (HIGH SENSITIVITY)   ____________________________________________  EKG   EKG Interpretation  Date/Time:  Tuesday October 12 2019 12:13:57 EST Ventricular Rate:  83 PR Interval:  174 QRS Duration: 94 QT Interval:  360 QTC Calculation: 423 R Axis:   35 Text Interpretation: Normal sinus rhythm with sinus arrhythmia Normal ECG No STEMI Confirmed by Nanda Quinton [id number]) on 10/12/2019 12:27:16 PM       ____________________________________________  RADIOLOGY  DG Chest Portable 1 View  Result Date: 10/12/2019 CLINICAL DATA:  Chest pain EXAM: PORTABLE CHEST 1 VIEW COMPARISON:  03/12/2017 FINDINGS: The heart size and mediastinal contours are within normal limits. No focal airspace consolidation, pleural effusion, or pneumothorax. Bilateral shoulder arthroplasties. IMPRESSION: No active disease. Electronically Signed   By: Davina Poke D.O.   On: 10/12/2019 13:33    ____________________________________________   PROCEDURES  Procedure(s) performed:  Procedures  None ____________________________________________   INITIAL IMPRESSION / ASSESSMENT AND PLAN / ED COURSE  Pertinent labs & imaging results that were available during my care of the patient were reviewed by me and considered in my medical decision making (see chart for details).   Patient presents to the ED with CP/palpitations with subjective fever. No fever here. No clear infection source. COVID negative. CXR clear. Patient describing pain as more of a "fluttering, uncomfortable" feeling. EKG interpreted as above. Patient has f/u with his Cardiologist in the AM. Troponin is 5. Doubt ACS or PE clinically. Plan for discharge with strong ED return precautions discussed.    ____________________________________________  FINAL CLINICAL  IMPRESSION(S) / ED DIAGNOSES  Final diagnoses:  Palpitations    Note:  This document was prepared using Dragon voice recognition software and may include unintentional dictation errors.  Nanda Quinton, MD, [facility] Emergency Medicine    Antrone Walla, [facility], MD 10/12/19 [phone]

## 2019-10-12 NOTE — ED Triage Notes (Signed)
Fever and chest pain x 5 days.

## 2019-10-12 NOTE — ED Notes (Signed)
Presents with mild left chest pain, "feels like skipping a beat" States first onset was when he was doing light house work. Denies any associated symptoms. Left chest pain, radiates to left arm and left shoulder

## 2019-10-12 NOTE — Telephone Encounter (Signed)
Spoke with [name] [name], [name] [name] regarding [name] increased blood pressure this morning. He took [name] normal medications including [name] antihypertensive around 6 am and by 9am [name] BP was 183/66 in the R arm and 173/59 in the L arm. Pt reports having a shooting pain in [name] lower chest below [name] heart. He reports neck and arm pain that occurred last night but has stopped this morning. He reports feeling constant fluttering in [name] heart.  BP is now down to 150/65. I advised pt to go to the ED to be evaluated as soon as possible. [name] [name] agreeable.Pt has an appt with Dr. Lennox Pippins tomorrow.

## 2019-10-12 NOTE — ED Notes (Signed)
Pt verbalized understanding of dc instructions.

## 2019-10-12 NOTE — ED Notes (Signed)
Portable Xray at bedside.

## 2019-10-12 NOTE — Discharge Instructions (Signed)
You are seen in the emergency department today with heart palpitations and discomfort in the chest.  Your lab work is reassuring and I do not see evidence of a heart attack or other injury to the heart.  Please see your cardiologist tomorrow as scheduled and return to the emergency department with any worsening pain/pressure or other worsening symptoms.

## 2019-10-12 NOTE — ED Notes (Signed)
Pt unhooked from monitor, ambulated to bathroom with no assistance

## 2019-10-12 NOTE — ED Notes (Signed)
ED Provider at bedside. 

## 2019-10-12 NOTE — Telephone Encounter (Signed)
New Message    Pt c/o BP issue: STAT if pt c/o blurred vision, one-sided weakness or slurred speech  1. What are your last 5 BP readings? This Morning Right arm 183/66 Left arm 173/59  2. Are you having any other symptoms (ex. Dizziness, headache, blurred vision, passed out)? No, but is having some discomfort on the side of his left temple area and left side of neck   3. What is your BP issue? Pts wife is calling and is concerned because of the High BP readings the pt is having and is having some discomfort.

## 2019-10-13 ENCOUNTER — Ambulatory Visit (INDEPENDENT_AMBULATORY_CARE_PROVIDER_SITE_OTHER): Payer: PPO | Admitting: Cardiology

## 2019-10-13 ENCOUNTER — Encounter: Payer: Self-pay | Admitting: Cardiology

## 2019-10-13 VITALS — BP 138/70 | HR 75 | Ht 69.0 in | Wt 183.0 lb

## 2019-10-13 DIAGNOSIS — I10 Essential (primary) hypertension: Secondary | ICD-10-CM

## 2019-10-13 LAB — URINE CULTURE
Culture: NO GROWTH
Special Requests: NORMAL

## 2019-10-13 MED ORDER — LOSARTAN POTASSIUM 100 MG PO TABS: 100.0000 mg | ORAL_TABLET | Freq: Every day | ORAL | 3 refills | Status: DC

## 2019-10-13 MED ORDER — ALPRAZOLAM 0.25 MG PO TABS: 0.2500 mg | ORAL_TABLET | Freq: Every evening | ORAL | 0 refills | Status: DC | PRN

## 2019-10-13 NOTE — Progress Notes (Signed)
Cardiology Office Note:    Date:  10/13/2019   ID:  [name] [name], DOB 29-Mar-1936, MRN [id number]  PCP:  Ma Hillock, DO  Cardiologist:  Jenean Lindau, MD   Referring MD: Ma Hillock, DO    ASSESSMENT:    1. Essential hypertension    PLAN:    In order of problems listed above:  1. Essential hypertension: I discussed my diagnosis with the patient at extensive length.  His blood pressure is stable.  He is very worried because of the above-mentioned issues.  I prescribed him alprazolam 0.25 mg to be taken on a as needed basis only.  10 tablets were given with no refills.  Precautions were explained.  He also complains of significant dry cough and I will change his benazepril to losartan 100 mg daily.  Patient will keep a type of his blood pressures.  He will be seen in follow-up appointment in 2 weeks or earlier if he has any concerns.  I had extensive review of his records and questions were answered to satisfaction.  Total time for this appointment was 30 minutes.   Medication Adjustments/Labs and Tests Ordered: Current medicines are reviewed at length with the patient today.  Concerns regarding medicines are outlined above.  No orders of the defined types were placed in this encounter.  No orders of the defined types were placed in this encounter.    Chief Complaint  Patient presents with  . Hypertension     History of Present Illness:    [name] [name] is a 84 y.o. male.  Patient has past medical history of essential hypertension.  He felt that he had a fever and might have had the Covid.  He was tested for it and this was negative.  Subsequently is felt fine.  He walks 30 minutes a day without any problems.  No chest pain orthopnea or PND.  At the time of my evaluation, the patient is alert awake oriented and in no distress.  I reviewed his emergency room records extensively.  Past Medical History:  Diagnosis Date  . ALLERGIC RHINITIS 04/23/2007  . BENIGN  PROSTATIC HYPERTROPHY 04/23/2007   resolved after TURP  . Cellulitis june 2013   left side("left flank")  . CHEST PAIN 02/04/2008  . H/O blurred vision    both eyes, occasional  . Headache   . HYPERLIPIDEMIA 04/23/2007  . HYPERTENSION 06/02/2008  . NEPHROLITHIASIS, HX OF 04/23/2007   lithotripsy- fragment remains, no symptoms   . 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 Neuropathy of left foot   . OSTEOARTHRITIS 04/23/2007  . Palpitations 02/04/2008   Qualifier: Diagnosis of  By: Burnice Logan  MD, Doretha Sou   . Pneumonia   . PONV (postoperative nausea and vomiting)   . RECTAL BLEEDING 02/04/2008   no recent problems  . 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 APPENDECTOMY    . COLONOSCOPY    . HERNIA REPAIR    . left foot surgery for fx  Jul 30, 2011  . LITHOTRIPSY  1992   cystoscopy with stent placement also done(1 stone fragment remains)  . right shoulder replacement  Sep 28, 2009  . TOTAL KNEE ARTHROPLASTY Right 08/30/2013   Procedure: RIGHT TOTAL KNEE ARTHROPLASTY RIGHT ;  Surgeon: Gearlean Alf, MD;  Location: [facility];  Service: Orthopedics;  Laterality: Right;  . TOTAL SHOULDER ARTHROPLASTY Left 02/02/2015   Procedure: LEFT TOTAL SHOULDER ARTHROPLASTY;  Surgeon: Justice Britain, MD;  Location: [facility];  Service: Orthopedics;  Laterality: Left;  . TRANSURETHRAL RESECTION OF PROSTATE  03/27/2012   Procedure: TRANSURETHRAL RESECTION OF THE PROSTATE WITH GYRUS INSTRUMENTS;  Surgeon: Claybon Jabs, MD;  Location: [facility];  Service: Urology;;    Current Medications: Current Meds  Medication Sig  . celecoxib (CELEBREX) 200 MG capsule TAKE ONE CAPSULE BY MOUTH ONE TIME DAILY   . Coenzyme Q10 (COQ10) 200 MG CAPS Take 200 mg by mouth at bedtime.  . fluticasone (FLONASE) 50 MCG/ACT nasal spray Place 1 spray into both nostrils  daily. (Patient taking differently: Place 1 spray into both nostrils daily as needed for allergies or rhinitis. )  . gabapentin (NEURONTIN) 600 MG tablet Take 1 tablet (600 mg total) by mouth at bedtime. (Patient taking differently: Take 600 mg by mouth 3 (three) times daily. )  . hydroxypropyl methylcellulose / hypromellose (ISOPTO TEARS / GONIOVISC) 2.5 % ophthalmic solution Place 1 drop into both eyes as needed for dry eyes.  Javier Docker Oil 1000 MG CAPS Take 2,000 mg by mouth daily with breakfast.  . loratadine (CLARITIN) 10 MG tablet Take 10 mg by mouth at bedtime.   . Multiple Vitamin (MULTIVITAMIN) tablet Take 1 tablet by mouth daily.  . NON FORMULARY Take 1 capsule by mouth See admin instructions. MedOp MaxiVision Whole Body Formula capsules: Take 1 capsule by mouth two times a day  . NON FORMULARY Take 1 packet by mouth See admin instructions. Relief Factor: Dissolve 1 packet into preferred beverage and drink two times a day  . NON FORMULARY Take 2 capsules by mouth See admin instructions. Clear Lungs capsules: Take 2 capsules by mouth in the morning  . potassium citrate (UROCIT-K) 10 MEQ (1080 MG) SR tablet TAKE 2 TABLETS BY MOUTH  TWICE DAILY WITH A MEAL  . Psyllium (METAMUCIL FIBER PO) Take 2 capsules by mouth daily with breakfast.  . simvastatin (ZOCOR) 40 MG tablet Take 1 tablet (40 mg total) by mouth at bedtime.  . vitamin C (ASCORBIC ACID) 500 MG tablet Take 1,500 mg by mouth at bedtime.  . [DISCONTINUED] benazepril-hydrochlorthiazide (LOTENSIN HCT) 20-12.5 MG tablet benazepril 20 mg-hydrochlorothiazide 12.5 mg tablet  TAKE 1 TABLET BY MOUTH EVERY DAY IN THE MORNING     Allergies:   Patient has no known allergies.   Social History   Socioeconomic History  . Marital status: Married    Spouse name: Not on file  . Number of children: Not on file  . Years of education: Not on file  . Highest education level: Not on file  Occupational History  . Not on file  Tobacco Use  .  Smoking status: Never Smoker  . Smokeless tobacco: Never Used  Substance and Sexual Activity  . Alcohol use: No  . Drug use: No  . Sexual activity: Yes    Partners: Female  Other Topics Concern  . Not on file  Social History Narrative   Marital status/children/pets: Married   Education/employment: retired   Investment banker, operational of Radio broadcast assistant Strain:   . Difficulty of Paying Living Expenses: Not on file  Food Insecurity:   . Worried About Charity fundraiser in the Last Year: Not on file  . Ran Out of Food in the Last Year: Not on file  Transportation Needs:   .  Lack of Transportation (Medical): Not on file  . Lack of Transportation (Non-Medical): Not on file  Physical Activity:   . Days of Exercise per Week: Not on file  . Minutes of Exercise per Session: Not on file  Stress:   . Feeling of Stress : Not on file  Social Connections:   . Frequency of Communication with Friends and Family: Not on file  . Frequency of Social Gatherings with Friends and Family: Not on file  . Attends Religious Services: Not on file  . Active Member of Clubs or Organizations: Not on file  . Attends Archivist Meetings: Not on file  . Marital Status: Not on file     Family History: The patient's family history includes COPD in his sister; Diabetes in his brother and sister; Heart disease (age of onset: 29) in his mother; Stroke (age of onset: 67) in his father.  ROS:   Please see the history of present illness.    All other systems reviewed and are negative.  EKGs/Labs/Other Studies Reviewed:    The following studies were reviewed today: Emergency room records were reviewed and discussed with the patient at length.  EKG reveals sinus rhythm and nonspecific ST-T changes.   Recent Labs: 05/13/2019: TSH 3.52 10/12/2019: ALT 18; BUN 36; Creatinine, Ser 0.80; Hemoglobin 13.7; Platelets 220; Potassium 4.1; Sodium 138  Recent Lipid Panel    Component Value Date/Time    CHOL 122 04/30/2019 0459   TRIG 34 04/30/2019 0459   HDL 56 04/30/2019 0459   CHOLHDL 2.2 04/30/2019 0459   VLDL 7 04/30/2019 0459   LDLCALC 59 04/30/2019 0459   LDLDIRECT 109.8 06/05/2011 0959    Physical Exam:    VS:  BP 138/70   Pulse 75   Ht 5\' 9"  (1.753 m)   Wt 183 lb (83 kg)   SpO2 96%   BMI 27.02 kg/m     Wt Readings from Last 3 Encounters:  10/13/19 183 lb (83 kg)  10/12/19 180 lb (81.6 kg)  07/01/19 181 lb 4 oz (82.2 kg)     GEN: Patient is in no acute distress HEENT: Normal NECK: No JVD; No carotid bruits LYMPHATICS: No lymphadenopathy CARDIAC: Hear sounds regular, 2/6 systolic murmur at the apex. RESPIRATORY:  Clear to auscultation without rales, wheezing or rhonchi  ABDOMEN: Soft, non-tender, non-distended MUSCULOSKELETAL:  No edema; No deformity  SKIN: Warm and dry NEUROLOGIC:  Alert and oriented x 3 PSYCHIATRIC:  Normal affect   Signed, Jenean Lindau, MD  10/13/2019 2:35 PM    [facility]

## 2019-10-13 NOTE — Patient Instructions (Signed)
Medication Instructions:  Your physician has recommended you make the following change in your medication:  START taking Xanax 0.25 mg (1 tablet) as needed for anxiety   STOP taking benazipril  *If you need a refill on your cardiac medications before your next appointment, please call your pharmacy*  Lab Work: NONE If you have labs (blood work) drawn today and your tests are completely normal, you will receive your results only by: Marland Kitchen MyChart Message (if you have MyChart) OR . A paper copy in the mail If you have any lab test that is abnormal or we need to change your treatment, we will call you to review the results.  Testing/Procedures: NONE Follow-Up: At [facility], you and your health needs are our priority.  As part of our continuing mission to provide you with exceptional heart care, we have created designated Provider Care Teams.  These Care Teams include your primary Cardiologist (physician) and Advanced Practice Providers (APPs -  Physician Assistants and Nurse Practitioners) who all work together to provide you with the care you need, when you need it.  Your next appointment:   2 week(s)  The format for your next appointment:   In Person  Provider:   Jyl Heinz, MD  Other Instructions Alprazolam tablets What is this medicine? ALPRAZOLAM (al PRAY zoe lam) is a benzodiazepine. It is used to treat anxiety and panic attacks. This medicine may be used for other purposes; ask your health care provider or pharmacist if you have questions. COMMON BRAND NAME(S): Xanax What should I tell my health care provider before I take this medicine? They need to know if you have any of these conditions:  an alcohol or drug abuse problem  bipolar disorder, depression, psychosis or other mental health conditions  glaucoma  kidney or liver disease  lung or breathing disease  myasthenia gravis  Parkinson's disease  porphyria  seizures or a history of  seizures  suicidal thoughts  an unusual or allergic reaction to alprazolam, other benzodiazepines, foods, dyes, or preservatives  pregnant or trying to get pregnant  breast-feeding How should I use this medicine? Take this medicine by mouth with a glass of water. Follow the directions on the prescription label. Take your medicine at regular intervals. Do not take it more often than directed. Do not stop taking except on your doctor's advice. A special MedGuide will be given to you by the pharmacist with each prescription and refill. Be sure to read this information carefully each time. Talk to your pediatrician regarding the use of this medicine in children. Special care may be needed. Overdosage: If you think you have taken too much of this medicine contact a poison control center or emergency room at once. NOTE: This medicine is only for you. Do not share this medicine with others. What if I miss a dose? If you miss a dose, take it as soon as you can. If it is almost time for your next dose, take only that dose. Do not take double or extra doses. What may interact with this medicine? Do not take this medicine with any of the following medications:  certain antiviral medicines for HIV or AIDS like delavirdine, indinavir  certain medicines for fungal infections like ketoconazole and itraconazole  narcotic medicines for cough  sodium oxybate This medicine may also interact with the following medications:  alcohol  antihistamines for allergy, cough and cold  certain antibiotics like clarithromycin, erythromycin, isoniazid, rifampin, rifapentine, rifabutin, and troleandomycin  certain medicines for blood  pressure, heart disease, irregular heart beat  certain medicines for depression, like amitriptyline, fluoxetine, sertraline  certain medicines for seizures like carbamazepine, oxcarbazepine, phenobarbital, phenytoin, primidone  cimetidine  cyclosporine  male hormones, like  estrogens or progestins and birth control pills, patches, rings, or injections  general anesthetics like halothane, isoflurane, methoxyflurane, propofol  grapefruit juice  local anesthetics like lidocaine, pramoxine, tetracaine  medicines that relax muscles for surgery  narcotic medicines for pain  other antiviral medicines for HIV or AIDS  phenothiazines like chlorpromazine, mesoridazine, prochlorperazine, thioridazine This list may not describe all possible interactions. Give your health care provider a list of all the medicines, herbs, non-prescription drugs, or dietary supplements you use. Also tell them if you smoke, drink alcohol, or use illegal drugs. Some items may interact with your medicine. What should I watch for while using this medicine? Tell your doctor or health care professional if your symptoms do not start to get better or if they get worse. Do not stop taking except on your doctor's advice. You may develop a severe reaction. Your doctor will tell you how much medicine to take. You may get drowsy or dizzy. Do not drive, use machinery, or do anything that needs mental alertness until you know how this medicine affects you. To reduce the risk of dizzy and fainting spells, do not stand or sit up quickly, especially if you are an older patient. Alcohol may increase dizziness and drowsiness. Avoid alcoholic drinks. If you are taking another medicine that also causes drowsiness, you may have more side effects. Give your health care provider a list of all medicines you use. Your doctor will tell you how much medicine to take. Do not take more medicine than directed. Call emergency for help if you have problems breathing or unusual sleepiness. What side effects may I notice from receiving this medicine? Side effects that you should report to your doctor or health care professional as soon as possible:  allergic reactions like skin rash, itching or hives, swelling of the face, lips,  or tongue  breathing problems  confusion  loss of balance or coordination  signs and symptoms of low blood pressure like dizziness; feeling faint or lightheaded, falls; unusually weak or tired  suicidal thoughts or other mood changes Side effects that usually do not require medical attention (report to your doctor or health care professional if they continue or are bothersome):  dizziness  dry mouth  nausea, vomiting  tiredness This list may not describe all possible side effects. Call your doctor for medical advice about side effects. You may report side effects to FDA at 1-800-FDA-1088. Where should I keep my medicine? Keep out of the reach of children. This medicine can be abused. Keep your medicine in a safe place to protect it from theft. Do not share this medicine with anyone. Selling or giving away this medicine is dangerous and against the law. Store at room temperature between 20 and 25 degrees C (68 and 77 degrees F). This medicine may cause accidental overdose and death if taken by other adults, children, or pets. Mix any unused medicine with a substance like cat litter or coffee grounds. Then throw the medicine away in a sealed container like a sealed bag or a coffee can with a lid. Do not use the medicine after the expiration date. NOTE: This sheet is a summary. It may not cover all possible information. If you have questions about this medicine, talk to your doctor, pharmacist, or health care provider.  2020 Elsevier/Gold Standard (2015-06-01 13:47:25)

## 2019-10-19 ENCOUNTER — Ambulatory Visit: Payer: PPO | Admitting: Cardiology

## 2019-10-28 ENCOUNTER — Ambulatory Visit (INDEPENDENT_AMBULATORY_CARE_PROVIDER_SITE_OTHER): Payer: PPO | Admitting: Cardiology

## 2019-10-28 ENCOUNTER — Encounter: Payer: Self-pay | Admitting: Cardiology

## 2019-10-28 ENCOUNTER — Other Ambulatory Visit: Payer: Self-pay

## 2019-10-28 VITALS — BP 132/66 | HR 67 | Temp 97.7°F | Ht 69.0 in | Wt 183.1 lb

## 2019-10-28 DIAGNOSIS — I350 Nonrheumatic aortic (valve) stenosis: Secondary | ICD-10-CM | POA: Diagnosis not present

## 2019-10-28 DIAGNOSIS — I351 Nonrheumatic aortic (valve) insufficiency: Secondary | ICD-10-CM

## 2019-10-28 DIAGNOSIS — I10 Essential (primary) hypertension: Secondary | ICD-10-CM
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
## 2019-10-28 NOTE — Patient Instructions (Signed)
Medication Instructions:  No medication changes *If you need a refill on your cardiac medications before your next appointment, please call your pharmacy*  Lab Work: None ordered If you have labs (blood work) drawn today and your tests are completely normal, you will receive your results only by: Marland Kitchen MyChart Message (if you have MyChart) OR . A paper copy in the mail If you have any lab test that is abnormal or we need to change your treatment, we will call you to review the results.  Testing/Procedures: None ordered  Follow-Up: At [facility], you and your health needs are our priority.  As part of our continuing mission to provide you with exceptional heart care, we have created designated Provider Care Teams.  These Care Teams include your primary Cardiologist (physician) and Advanced Practice Providers (APPs -  Physician Assistants and Nurse Practitioners) who all work together to provide you with the care you need, when you need it.  Your next appointment:   3 month(s)  The format for your next appointment:   In Person  Provider:   Jyl Heinz, MD  Other Instructions NA

## 2019-12-09 DIAGNOSIS — M5136 Other intervertebral disc degeneration, lumbar region: Secondary | ICD-10-CM | POA: Insufficient documentation

## 2019-12-09 DIAGNOSIS — H906 Mixed conductive and sensorineural hearing loss, bilateral: Secondary | ICD-10-CM | POA: Diagnosis not present

## 2019-12-09 DIAGNOSIS — M51369 Other intervertebral disc degeneration, lumbar region without mention of lumbar back pain or lower extremity pain: Secondary | ICD-10-CM

## 2019-12-09 HISTORY — DX: Other intervertebral disc degeneration, lumbar region without mention of lumbar back pain or lower extremity pain: M51.369

## 2019-12-09 HISTORY — DX: Other intervertebral disc degeneration, lumbar region: M51.36

## 2019-12-16 DIAGNOSIS — M5136 Other intervertebral disc degeneration, lumbar region: Secondary | ICD-10-CM | POA: Diagnosis not present

## 2019-12-30 DIAGNOSIS — H906 Mixed conductive and sensorineural hearing loss, bilateral: Secondary | ICD-10-CM | POA: Diagnosis not present

## 2020-01-07 ENCOUNTER — Other Ambulatory Visit: Payer: Self-pay | Admitting: Family Medicine

## 2020-01-14 ENCOUNTER — Telehealth: Payer: Self-pay

## 2020-01-14 MED ORDER — POTASSIUM CITRATE ER 10 MEQ (1080 MG) PO TBCR: EXTENDED_RELEASE_TABLET | ORAL | 0 refills | Status: DC

## 2020-01-14 NOTE — Telephone Encounter (Signed)
Patient refill request   potassium citrate (UROCIT-K) 10 MEQ (1080 MG) SR tablet   [facility]

## 2020-01-14 NOTE — Telephone Encounter (Signed)
Pt was called and scheduled for his 6 month [facility], 30 day supply sent to pharmacy to last until patients appt

## 2020-01-17 DIAGNOSIS — I700 Atherosclerosis of aorta: Secondary | ICD-10-CM

## 2020-01-17 DIAGNOSIS — M5136 Other intervertebral disc degeneration, lumbar region: Secondary | ICD-10-CM | POA: Diagnosis not present

## 2020-01-17 DIAGNOSIS — I10 Essential (primary) hypertension: Secondary | ICD-10-CM | POA: Diagnosis not present

## 2020-01-17 HISTORY — DX: Atherosclerosis of aorta: I70.0

## 2020-01-18 ENCOUNTER — Other Ambulatory Visit: Payer: Self-pay

## 2020-01-18 ENCOUNTER — Encounter: Payer: Self-pay | Admitting: Family Medicine

## 2020-01-18 ENCOUNTER — Ambulatory Visit (INDEPENDENT_AMBULATORY_CARE_PROVIDER_SITE_OTHER): Payer: PPO | Admitting: Family Medicine

## 2020-01-18 VITALS — BP 141/82 | HR 70 | Temp 98.6°F | Resp 16 | Ht 69.0 in | Wt 185.4 lb

## 2020-01-18 DIAGNOSIS — N400 Enlarged prostate without lower urinary tract symptoms: Secondary | ICD-10-CM | POA: Diagnosis not present

## 2020-01-18 DIAGNOSIS — E785 Hyperlipidemia, unspecified: Secondary | ICD-10-CM

## 2020-01-18 DIAGNOSIS — R972 Elevated prostate specific antigen [PSA]: Secondary | ICD-10-CM | POA: Diagnosis not present

## 2020-01-18 DIAGNOSIS — N200 Calculus of kidney: Secondary | ICD-10-CM

## 2020-01-18 DIAGNOSIS — G629 Polyneuropathy, unspecified: Secondary | ICD-10-CM

## 2020-01-18 DIAGNOSIS — M8949 Other hypertrophic osteoarthropathy, multiple sites: Secondary | ICD-10-CM

## 2020-01-18 DIAGNOSIS — I10 Essential (primary) hypertension: Secondary | ICD-10-CM | POA: Diagnosis not present

## 2020-01-18 DIAGNOSIS — M159 Polyosteoarthritis, unspecified: Secondary | ICD-10-CM

## 2020-01-18 MED ORDER — SIMVASTATIN 40 MG PO TABS: 40.0000 mg | ORAL_TABLET | Freq: Every day | ORAL | 1 refills | Status: DC

## 2020-01-18 MED ORDER — GABAPENTIN 600 MG PO TABS: 600.0000 mg | ORAL_TABLET | Freq: Two times a day (BID) | ORAL | 1 refills | Status: DC

## 2020-01-18 MED ORDER — POTASSIUM CITRATE ER 10 MEQ (1080 MG) PO TBCR: EXTENDED_RELEASE_TABLET | ORAL | 5 refills | Status: DC

## 2020-01-18 NOTE — Patient Instructions (Addendum)
I have refilled your medications we provide.  Follow up in 5.5 months.   It was great  To see you today!

## 2020-01-18 NOTE — Progress Notes (Signed)
SUBJECTIVE  Patient Care Team    Relationship Specialty Notifications Start End  Ma Hillock, DO PCP - General Family Medicine  01/19/19   [name] Sine, MD Consulting Physician Dermatology  01/19/20   Revankar, [name] Cliche, MD Consulting Physician Cardiology  01/19/20   [name] [name], [facility]  Specialist  01/19/20   [name] [name]  Optometry  01/19/20     Chief Complaint  Patient presents with  . Hypertension    Needs refills on potassium. Celebrex and Gabapentin go to [facility].  . Hyperlipidemia    HPI: [name] [name] is a 84 y.o. male present for chronic medical condition follow-up with acute concerns. Hypertension/hyperlipidemia: Pt reports compliance with losartan 100, Zocor 40 mg nightly. Blood pressures ranges at home in normal limits. Patient denies chest pain, shortness of breath, dizziness or lower extremity edema.  Pt takes a daily baby ASA. Pt is  prescribed statin. Labs up-to-date.  He also has cardiology labs scheduled this week. Diet: Monitors his diet Exercise: Routine exercise RF: Hypertension, hyperlipidemia, family history of heart disease and stroke  BPH/arthritis/neuropathy: Patient has a history of arthritis status post shoulder replacement and knee arthroplasty.  He also has a history of BPH/s/p TURP.  He states he takes Celebrex 200 mg daily to help with his arthritis and it actually works better for his BPH than any of the other medications tried.  He reports he is doing well on Celebrex and needs refills today.  He did try the Flomax and it was not helpful.  He sustained a lower extremity fracture injury in which she has chronic neuropathy.  Gabapentin is helpful, he is taking 600 mg twice daily for his neuropathy pain.  Chronic cough: Chronic cough improved with the removal of Benzapril and start of losartan.  Losartan prescribed by cardiology.  Nephrolithiasis/elevated PSA: Patient reports he has been prescribed potassium citrate for many years.  He has  followed with urology during this time.  His urologist Dr. Karsten [name] has retired.  He would like a referral to a new urologist today.  He underwent lithotripsy in August 2008.  He states there still is a fragment of the stone up in his kidney but it has never created any problems for him.  ROS: See pertinent positives and negatives per HPI.  Patient Active Problem List   Diagnosis Date Noted  . Degeneration of lumbar intervertebral disc 12/09/2019  . Abnormal ankle brachial index (ABI) 07/05/2019  . Chronic cough 07/05/2019  . Abnormal echocardiogram 05/13/2019  . LAE (left atrial enlargement) 05/13/2019  . Aortic valve calcification 05/13/2019  . Aortic valve regurgitation 05/13/2019  . Aortic valve stenosis 05/13/2019  . TIA (transient ischemic attack) 05/13/2019  . Elevated TSH 05/13/2019  . Visual field defect 04/29/2019  . Chronic pain 04/29/2019  . Neuropathy- chronic s/p trauma 01/20/2019  . Sensorineural hearing loss (SNHL), bilateral 06/14/2016  . Impaired glucose tolerance 04/01/2016  . Essential hypertension 06/02/2008  . Dyslipidemia 04/23/2007  . Allergic rhinitis 04/23/2007  . BPH (benign prostatic hyperplasia) 04/23/2007    Class: Present on Admission  . Osteoarthritis 04/23/2007    Social History   Tobacco Use  . Smoking status: Never Smoker  . Smokeless tobacco: Never Used  Substance Use Topics  . Alcohol use: No    Current Outpatient Medications:  .  celecoxib (CELEBREX) 200 MG capsule, TAKE ONE CAPSULE BY MOUTH ONE TIME DAILY , Disp: 90 capsule, Rfl: 3 .  Coenzyme Q10 (COQ10) 200 MG CAPS, Take 200 mg by  mouth at bedtime., Disp: , Rfl:  .  fluticasone (FLONASE) 50 MCG/ACT nasal spray, Place 1 spray into both nostrils daily. (Patient taking differently: Place 1 spray into both nostrils daily as needed for allergies or rhinitis. ), Disp: 32 g, Rfl: 3 .  gabapentin (NEURONTIN) 600 MG tablet, Take 1 tablet (600 mg total) by mouth 2 (two) times daily., Disp: 180  tablet, Rfl: 1 .  ipratropium (ATROVENT) 0.03 % nasal spray, Place 2 sprays into both nostrils every 12 (twelve) hours., Disp: , Rfl:  .  Krill Oil 1000 MG CAPS, Take 2,000 mg by mouth daily with breakfast., Disp: , Rfl:  .  Multiple Vitamin (MULTIVITAMIN) tablet, Take 1 tablet by mouth daily., Disp: , Rfl:  .  NON FORMULARY, Take 1 capsule by mouth See admin instructions. MedOp MaxiVision Whole Body Formula capsules: Take 1 capsule by mouth two times a day, Disp: , Rfl:  .  NON FORMULARY, Take 1 packet by mouth See admin instructions. Relief Factor: Dissolve 1 packet into preferred beverage and drink two times a day, Disp: , Rfl:  .  NON FORMULARY, Take 2 capsules by mouth See admin instructions. Clear Lungs capsules: Take 2 capsules by mouth in the morning, Disp: , Rfl:  .  potassium citrate (UROCIT-K) 10 MEQ (1080 MG) SR tablet, TAKE 2 TABLETS BY MOUTH  TWICE DAILY WITH A MEAL, Disp: 120 tablet, Rfl: 5 .  Psyllium (METAMUCIL FIBER PO), Take 2 capsules by mouth daily with breakfast., Disp: , Rfl:  .  simvastatin (ZOCOR) 40 MG tablet, Take 1 tablet (40 mg total) by mouth at bedtime., Disp: 90 tablet, Rfl: 1 .  vitamin C (ASCORBIC ACID) 500 MG tablet, Take 1,500 mg by mouth at bedtime., Disp: , Rfl:  .  fexofenadine (ALLEGRA ALLERGY) 180 MG tablet, Allegra Allergy, Disp: , Rfl:  .  losartan (COZAAR) 100 MG tablet, Take 1 tablet (100 mg total) by mouth daily., Disp: 90 tablet, Rfl: 3  No Known Allergies  OBJECTIVE: BP (!) 141/82 (BP Location: Left Arm, Patient Position: Sitting, Cuff Size: Normal)   Pulse 70   Temp 98.6 F (37 C) (Temporal)   Resp 16   Ht 5\' 9"  (1.753 m)   Wt 185 lb 6 oz (84.1 kg)   SpO2 97%   BMI 27.38 kg/m  Gen: Afebrile. No acute distress.  Nontoxic.  Pleasant male. HENT: AT. [place].  Eyes:Pupils Equal Round Reactive to light, Extraocular movements intact,  Conjunctiva without redness, discharge or icterus. Neck/lymp/endocrine: Supple, no lymphadenopathy, no  thyromegaly CV: RRR no murmur, no edema Chest: CTAB, no wheeze or crackles Skin: no rashes, purpura or petechiae.  Neuro:  Normal gait. PERLA. EOMi. Alert. Oriented x3  Psych: Normal affect, dress and demeanor. Normal speech. Normal thought content and judgment.    ASSESSMENT AND PLAN: [name] [name] is a 84 y.o. male present for TOC.  Benign prostatic hyperplasia without lower urinary tract symptoms/nephrolithiasis/elevated PSA -Stable BPH -Continue celebrex> [facility] - flomax tried and reported not helpful.  -Continue potassium citrate> [facility] -Referral to urology placed-his urologist retired.  Dyslipidemia/Essential hypertension/Overweight (BMI 25.0-29.9) -Blood pressure stable on losartan. -Continue losartan-prescribed by cardiology -Continue Zocor 40 mg daily> [facility] - continue baby aspirin -Labs up-to-date - low sodium diet and routine exercise.  Follow-up 6 months, sooner if blood pressure above goal.  Neuropathy- chronic s/p trauma -Stable.  Status post trauma to lower extremity. -Continue gabapentin 600 mg twice daily> [facility]  Primary osteoarthritis involving multiple joints -Stable. -Continue Celebrex  Abnormal  ABI: Discussed signs and symptoms for him to monitor for decreased circulation.  At this time he would like to wait on any further testing.   Follow-up in 6 months provider appointment on chronic medical conditions   Orders Placed This Encounter  Procedures  . Ambulatory referral to Urology   Meds ordered this encounter  Medications  . DISCONTD: simvastatin (ZOCOR) 40 MG tablet    Sig: Take 1 tablet (40 mg total) by mouth at bedtime.    Dispense:  90 tablet    Refill:  1  . DISCONTD: potassium citrate (UROCIT-K) 10 MEQ (1080 MG) SR tablet    Sig: TAKE 2 TABLETS BY MOUTH  TWICE DAILY WITH A MEAL    Dispense:  120 tablet    Refill:  5  . gabapentin (NEURONTIN) 600 MG tablet    Sig: Take 1 tablet (600 mg total) by mouth 2 (two) times daily.     Dispense:  180 tablet    Refill:  1  . potassium citrate (UROCIT-K) 10 MEQ (1080 MG) SR tablet    Sig: TAKE 2 TABLETS BY MOUTH  TWICE DAILY WITH A MEAL    Dispense:  120 tablet    Refill:  5  . simvastatin (ZOCOR) 40 MG tablet    Sig: Take 1 tablet (40 mg total) by mouth at bedtime.    Dispense:  90 tablet    Refill:  1    Referral Orders     Ambulatory referral to Urology    Howard Pouch, DO 01/19/2020

## 2020-01-19 ENCOUNTER — Encounter: Payer: Self-pay | Admitting: Family Medicine

## 2020-01-21 ENCOUNTER — Ambulatory Visit: Payer: PPO | Admitting: Cardiology

## 2020-01-21 ENCOUNTER — Encounter: Payer: Self-pay | Admitting: Cardiology

## 2020-01-21 ENCOUNTER — Other Ambulatory Visit: Payer: Self-pay

## 2020-01-21 VITALS — BP 160/70 | HR 62 | Ht 69.0 in | Wt 185.0 lb

## 2020-01-21 DIAGNOSIS — G459 Transient cerebral ischemic attack, unspecified: Secondary | ICD-10-CM

## 2020-01-21 DIAGNOSIS — I6529 Occlusion and stenosis of unspecified carotid artery: Secondary | ICD-10-CM

## 2020-01-21 DIAGNOSIS — I10 Essential (primary) hypertension: Secondary | ICD-10-CM

## 2020-01-21 DIAGNOSIS — E785 Hyperlipidemia, unspecified: Secondary | ICD-10-CM | POA: Diagnosis not present

## 2020-01-21 DIAGNOSIS — I351 Nonrheumatic aortic (valve) insufficiency: Secondary | ICD-10-CM | POA: Diagnosis not present

## 2020-01-21 HISTORY — DX: Occlusion and stenosis of unspecified carotid artery: I65.29

## 2020-01-21 NOTE — Patient Instructions (Signed)
Medication Instructions:  No medication changes *If you need a refill on your cardiac medications before your next appointment, please call your pharmacy*   Lab Work: Your physician recommends that you have labs in the office today. We done a bmet, cbc, tsh, lft and lipids.  If you have labs (blood work) drawn today and your tests are completely normal, you will receive your results only by: Marland Kitchen MyChart Message (if you have MyChart) OR . A paper copy in the mail If you have any lab test that is abnormal or we need to change your treatment, we will call you to review the results.   Testing/Procedures: None ordered   Follow-Up: At [facility], you and your health needs are our priority.  As part of our continuing mission to provide you with exceptional heart care, we have created designated Provider Care Teams.  These Care Teams include your primary Cardiologist (physician) and Advanced Practice Providers (APPs -  Physician Assistants and Nurse Practitioners) who all work together to provide you with the care you need, when you need it.  We recommend signing up for the patient portal called "MyChart".  Sign up information is provided on this After Visit Summary.  MyChart is used to connect with patients for Virtual Visits (Telemedicine).  Patients are able to view lab/test results, encounter notes, upcoming appointments, etc.  Non-urgent messages can be sent to your provider as well.   To learn more about what you can do with MyChart, go to [email or web address].    Your next appointment:   6 month(s)  The format for your next appointment:   In Person  Provider:   Jyl Heinz, MD   Other Instructions NA
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
## 2020-01-22 LAB — BASIC METABOLIC PANEL
BUN/Creatinine Ratio: 49 — ABNORMAL HIGH (ref 10–24)
BUN: 37 mg/dL — ABNORMAL HIGH (ref 8–27)
CO2: 23 mmol/L (ref 20–29)
Calcium: 9.3 mg/dL (ref 8.6–10.2)
Chloride: 105 mmol/L (ref 96–106)
Creatinine, Ser: 0.76 mg/dL (ref 0.76–1.27)
GFR calc Af Amer: 99 mL/min/{1.73_m2} (ref >59)
GFR calc non Af Amer: 86 mL/min/{1.73_m2} (ref >59)
Glucose: 91 mg/dL (ref 65–99)
Potassium: 4.6 mmol/L (ref 3.5–5.2)
Sodium: 140 mmol/L (ref 134–144)

## 2020-01-22 LAB — CBC WITH DIFFERENTIAL/PLATELET
Basophils Absolute: 0.1 10*3/uL (ref 0.0–0.2)
Basos: 1 %
EOS (ABSOLUTE): 0.3 10*3/uL (ref 0.0–0.4)
Eos: 4 %
Hematocrit: 36.1 % — ABNORMAL LOW (ref 37.5–51.0)
Hemoglobin: 12.3 g/dL — ABNORMAL LOW (ref 13.0–17.7)
Immature Grans (Abs): 0 10*3/uL (ref 0.0–0.1)
Immature Granulocytes: 0 %
Lymphocytes Absolute: 1.2 10*3/uL (ref 0.7–3.1)
Lymphs: 17 %
MCH: 31.6 pg (ref 26.6–33.0)
MCHC: 34.1 g/dL (ref 31.5–35.7)
MCV: 93 fL (ref 79–97)
Monocytes Absolute: 0.7 10*3/uL (ref 0.1–0.9)
Monocytes: 10 %
Neutrophils Absolute: 4.7 10*3/uL (ref 1.4–7.0)
Neutrophils: 68 %
Platelets: 183 10*3/uL (ref 150–450)
RBC: 3.89 x10E6/uL — ABNORMAL LOW (ref 4.14–5.80)
RDW: 13.1 % (ref 11.6–15.4)
WBC: 6.9 10*3/uL (ref 3.4–10.8)

## 2020-01-22 LAB — LIPID PANEL
Chol/HDL Ratio: 1.9 ratio (ref 0.0–5.0)
Cholesterol, Total: 137 mg/dL (ref 100–199)
HDL: 72 mg/dL (ref >39)
LDL Chol Calc (NIH): 54 mg/dL (ref 0–99)
Triglycerides: 46 mg/dL (ref 0–149)
VLDL Cholesterol Cal: 11 mg/dL (ref 5–40)

## 2020-01-22 LAB — HEPATIC FUNCTION PANEL
ALT: 19 IU/L (ref 0–44)
AST: 24 IU/L (ref 0–40)
Albumin: 4.7 g/dL — ABNORMAL HIGH (ref 3.6–4.6)
Alkaline Phosphatase: 63 IU/L (ref 39–117)
Bilirubin Total: 0.8 mg/dL (ref 0.0–1.2)
Bilirubin, Direct: 0.21 mg/dL (ref 0.00–0.40)
Total Protein: 6.6 g/dL (ref 6.0–8.5)

## 2020-01-22 LAB — TSH: TSH: 4.35 u[IU]/mL (ref 0.450–4.500)

## 2020-01-25 ENCOUNTER — Ambulatory Visit: Payer: PPO | Admitting: Family Medicine

## 2020-02-07 DIAGNOSIS — H2513 Age-related nuclear cataract, bilateral: Secondary | ICD-10-CM | POA: Diagnosis not present

## 2020-02-25 DIAGNOSIS — H25013 Cortical age-related cataract, bilateral: Secondary | ICD-10-CM | POA: Diagnosis not present

## 2020-02-25 DIAGNOSIS — H353131 Nonexudative age-related macular degeneration, bilateral, early dry stage: Secondary | ICD-10-CM | POA: Diagnosis not present

## 2020-02-25 DIAGNOSIS — H25043 Posterior subcapsular polar age-related cataract, bilateral: Secondary | ICD-10-CM | POA: Diagnosis not present

## 2020-02-25 DIAGNOSIS — H2513 Age-related nuclear cataract, bilateral: Secondary | ICD-10-CM | POA: Diagnosis not present

## 2020-02-25 DIAGNOSIS — H18413 Arcus senilis, bilateral: Secondary | ICD-10-CM | POA: Diagnosis not present

## 2020-02-25 DIAGNOSIS — H2511 Age-related nuclear cataract, right eye: Secondary | ICD-10-CM | POA: Diagnosis not present

## 2020-03-06 ENCOUNTER — Encounter: Payer: Self-pay | Admitting: Family Medicine

## 2020-03-06 DIAGNOSIS — H40023 Open angle with borderline findings, high risk, bilateral: Secondary | ICD-10-CM | POA: Diagnosis not present

## 2020-03-06 DIAGNOSIS — I10 Essential (primary) hypertension: Secondary | ICD-10-CM

## 2020-03-06 MED ORDER — FLUTICASONE PROPIONATE 50 MCG/ACT NA SUSP: 1.0000 | Freq: Every day | NASAL | 1 refills | Status: DC

## 2020-03-07 DIAGNOSIS — N401 Enlarged prostate with lower urinary tract symptoms: Secondary | ICD-10-CM | POA: Diagnosis not present

## 2020-03-07 DIAGNOSIS — N200 Calculus of kidney: Secondary | ICD-10-CM | POA: Diagnosis not present

## 2020-03-07 DIAGNOSIS — N138 Other obstructive and reflux uropathy: Secondary | ICD-10-CM | POA: Diagnosis not present

## 2020-03-07 DIAGNOSIS — R972 Elevated prostate specific antigen [PSA]: Secondary | ICD-10-CM | POA: Diagnosis not present

## 2020-03-21 DIAGNOSIS — L814 Other melanin hyperpigmentation: Secondary | ICD-10-CM | POA: Diagnosis not present

## 2020-03-21 DIAGNOSIS — D485 Neoplasm of uncertain behavior of skin: Secondary | ICD-10-CM | POA: Diagnosis not present

## 2020-03-21 DIAGNOSIS — L859 Epidermal thickening, unspecified: Secondary | ICD-10-CM | POA: Diagnosis not present

## 2020-03-21 DIAGNOSIS — B078 Other viral warts: Secondary | ICD-10-CM | POA: Diagnosis not present

## 2020-03-21 DIAGNOSIS — D225 Melanocytic nevi of trunk: Secondary | ICD-10-CM | POA: Diagnosis not present

## 2020-03-21 DIAGNOSIS — Z85828 Personal history of other malignant neoplasm of skin: Secondary | ICD-10-CM | POA: Diagnosis not present

## 2020-03-21 DIAGNOSIS — L570 Actinic keratosis: Secondary | ICD-10-CM | POA: Diagnosis not present

## 2020-03-21 DIAGNOSIS — L821 Other seborrheic keratosis: Secondary | ICD-10-CM | POA: Diagnosis not present

## 2020-03-21 DIAGNOSIS — D220 Melanocytic nevi of lip: Secondary | ICD-10-CM | POA: Diagnosis not present

## 2020-04-10 DIAGNOSIS — H2511 Age-related nuclear cataract, right eye: Secondary | ICD-10-CM | POA: Diagnosis not present

## 2020-04-10 DIAGNOSIS — H2513 Age-related nuclear cataract, bilateral: Secondary | ICD-10-CM | POA: Diagnosis not present

## 2020-04-11 DIAGNOSIS — H2512 Age-related nuclear cataract, left eye: Secondary | ICD-10-CM | POA: Diagnosis not present

## 2020-04-16 HISTORY — PX: CATARACT EXTRACTION: SUR2

## 2020-05-01 DIAGNOSIS — H2513 Age-related nuclear cataract, bilateral: Secondary | ICD-10-CM | POA: Diagnosis not present

## 2020-05-01 DIAGNOSIS — H2512 Age-related nuclear cataract, left eye: Secondary | ICD-10-CM | POA: Diagnosis not present

## 2020-05-28 ENCOUNTER — Other Ambulatory Visit: Payer: Self-pay | Admitting: Family Medicine

## 2020-05-28 DIAGNOSIS — I10 Essential (primary) hypertension: Secondary | ICD-10-CM

## 2020-06-16 ENCOUNTER — Encounter: Payer: Self-pay | Admitting: Cardiology

## 2020-06-16 ENCOUNTER — Other Ambulatory Visit: Payer: Self-pay

## 2020-06-16 ENCOUNTER — Ambulatory Visit (INDEPENDENT_AMBULATORY_CARE_PROVIDER_SITE_OTHER): Payer: PPO | Admitting: Cardiology

## 2020-06-16 VITALS — BP 130/62 | HR 67 | Ht 69.0 in | Wt 186.0 lb

## 2020-06-16 DIAGNOSIS — I351 Nonrheumatic aortic (valve) insufficiency: Secondary | ICD-10-CM | POA: Diagnosis not present

## 2020-06-16 DIAGNOSIS — E785 Hyperlipidemia, unspecified: Secondary | ICD-10-CM

## 2020-06-16 DIAGNOSIS — I350 Nonrheumatic aortic (valve) stenosis: Secondary | ICD-10-CM

## 2020-06-16 DIAGNOSIS — I6529 Occlusion and stenosis of unspecified carotid artery: Secondary | ICD-10-CM | POA: Diagnosis not present

## 2020-06-16 MED ORDER — LOSARTAN POTASSIUM 50 MG PO TABS: 50.0000 mg | ORAL_TABLET | Freq: Every day | ORAL | 3 refills | Status: DC

## 2020-06-16 NOTE — Patient Instructions (Addendum)
Medication Instructions:  Your physician has recommended you make the following change in your medication:   Decrease Losartan to 50 mg daily.  *If you need a refill on your cardiac medications before your next appointment, please call your pharmacy*   Lab Work: None ordered If you have labs (blood work) drawn today and your tests are completely normal, you will receive your results only by: Marland Kitchen MyChart Message (if you have MyChart) OR . A paper copy in the mail If you have any lab test that is abnormal or we need to change your treatment, we will call you to review the results.   Testing/Procedures: None ordered   Follow-Up: At [facility], you and your health needs are our priority.  As part of our continuing mission to provide you with exceptional heart care, we have created designated Provider Care Teams.  These Care Teams include your primary Cardiologist (physician) and Advanced Practice Providers (APPs -  Physician Assistants and Nurse Practitioners) who all work together to provide you with the care you need, when you need it.  We recommend signing up for the patient portal called "MyChart".  Sign up information is provided on this After Visit Summary.  MyChart is used to connect with patients for Virtual Visits (Telemedicine).  Patients are able to view lab/test results, encounter notes, upcoming appointments, etc.  Non-urgent messages can be sent to your provider as well.   To learn more about what you can do with MyChart, go to [email or web address].    Your next appointment:   2 month(s)  The format for your next appointment:   In Person  Provider:   Jyl Heinz, MD   Other Instructions NA

## 2020-06-16 NOTE — Progress Notes (Signed)
Cardiology Office Note:    Date:  06/16/2020   ID:  [name] [name], DOB [birth year], MRN [id number]  PCP:  Ma Hillock, DO  Cardiologist:  Jenean Lindau, MD   Referring MD: Ma Hillock, DO    ASSESSMENT:    1. Aortic valve stenosis, etiology of cardiac valve disease unspecified   2. Carotid atherosclerosis, unspecified laterality   3. Dyslipidemia   4. Aortic valve insufficiency, etiology of cardiac valve disease unspecified    PLAN:    In order of problems listed above:  1. Secondary prevention stressed with the patient.  Importance of compliance with diet medication stressed any vocalized understanding.  He has excellent effort tolerance and I congratulated him on his exercise pattern. 2. Atherosclerotic vascular disease: Secondary prevention stressed.  Importance of compliance with diet medication stressed.  He has carotid atherosclerosis medical management. 3. Mixed dyslipidemia: On statin therapy and lipids are fine and diet was encouraged 4. Essential hypertension: He is running slightly low blood pressure therefore I will reduce losartan to 50 mg daily.  He will keep a track of his blood pressures and see me in follow-up appointment in 2 months or earlier if he has any concerns.  Patient and wife had multiple questions which were answered to their satisfaction.   Medication Adjustments/Labs and Tests Ordered: Current medicines are reviewed at length with the patient today.  Concerns regarding medicines are outlined above.  No orders of the defined types were placed in this encounter.  No orders of the defined types were placed in this encounter.    Chief Complaint  Patient presents with  . Annual Exam     History of Present Illness:    [name] [name] is a 84 y.o. male.  Patient has past medical history of carotid artery disease, essential hypertension mild aortic stenosis and regurgitation.  He denies any problems at this time and takes care of  activities of daily living.  No chest pain orthopnea or PND.  He exercises on a very regular basis.  His wife mentions to me that at times his blood pressure will drop to about 854 mmHg systolic and he might feel dizzy at times.  No syncope.  Past Medical History:  Diagnosis Date  . ALLERGIC RHINITIS 04/23/2007  . BENIGN PROSTATIC HYPERTROPHY 04/23/2007   resolved after TURP  . Cellulitis june 2013   left side("left flank")  . CHEST PAIN 02/04/2008  . H/O blurred vision    both eyes, occasional  . Headache   . HYPERLIPIDEMIA 04/23/2007  . HYPERTENSION 06/02/2008  . NEPHROLITHIASIS, HX OF 04/23/2007   lithotripsy- fragment remains, no symptoms   . 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 Neuropathy of left foot   . OSTEOARTHRITIS 04/23/2007  . Palpitations 02/04/2008   Qualifier: Diagnosis of  By: Burnice Logan  MD, Doretha Sou   . Pneumonia   . PONV (postoperative nausea and vomiting)   . RECTAL BLEEDING 02/04/2008   no recent problems  . 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 APPENDECTOMY    . CATARACT EXTRACTION Bilateral 04/2020  . COLONOSCOPY    . HERNIA REPAIR    . left foot surgery for fx  Jul 30, 2011  . LITHOTRIPSY  1992   cystoscopy with stent placement also done(1 stone fragment  remains)  . right shoulder replacement  Sep 28, 2009  . TOTAL KNEE ARTHROPLASTY Right 08/30/2013   Procedure: RIGHT TOTAL KNEE ARTHROPLASTY RIGHT ;  Surgeon: Gearlean Alf, MD;  Location: [facility];  Service: Orthopedics;  Laterality: Right;  . TOTAL SHOULDER ARTHROPLASTY Left 02/02/2015   Procedure: LEFT TOTAL SHOULDER ARTHROPLASTY;  Surgeon: Justice Britain, MD;  Location: [facility];  Service: Orthopedics;  Laterality: Left;  . TRANSURETHRAL RESECTION OF PROSTATE  03/27/2012   Procedure: TRANSURETHRAL RESECTION OF THE PROSTATE WITH GYRUS  INSTRUMENTS;  Surgeon: Claybon Jabs, MD;  Location: [facility];  Service: Urology;;    Current Medications: Current Meds  Medication Sig  . aspirin 81 MG chewable tablet 81 mg daily.  . celecoxib (CELEBREX) 200 MG capsule TAKE ONE CAPSULE BY MOUTH ONE TIME DAILY   . Coenzyme Q10 (COQ10) 200 MG CAPS Take 200 mg by mouth at bedtime.  . fexofenadine (ALLEGRA ALLERGY) 180 MG tablet Allegra Allergy  . fluticasone (FLONASE) 50 MCG/ACT nasal spray PLACE 1 SPRAY INTO BOTH NOSTRILS DAILY.  Marland Kitchen gabapentin (NEURONTIN) 600 MG tablet Take 1 tablet (600 mg total) by mouth 2 (two) times daily.  Javier Docker Oil 1000 MG CAPS Take 2,000 mg by mouth daily with breakfast.  . losartan (COZAAR) 100 MG tablet Take 1 tablet (100 mg total) by mouth daily.  . Multiple Vitamin (MULTIVITAMIN) tablet Take 1 tablet by mouth daily.  . NON FORMULARY Take 1 capsule by mouth See admin instructions. MedOp MaxiVision Whole Body Formula capsules: Take 1 capsule by mouth two times a day  . NON FORMULARY Take 1 packet by mouth See admin instructions. Relief Factor: Dissolve 1 packet into preferred beverage and drink two times a day  . NON FORMULARY Take 2 capsules by mouth See admin instructions. Clear Lungs capsules: Take 2 capsules by mouth in the morning  . potassium citrate (UROCIT-K) 10 MEQ (1080 MG) SR tablet TAKE 2 TABLETS BY MOUTH  TWICE DAILY WITH A MEAL  . Psyllium (METAMUCIL FIBER PO) Take 2 capsules by mouth daily with breakfast.  . simvastatin (ZOCOR) 40 MG tablet Take 1 tablet (40 mg total) by mouth at bedtime.  . vitamin C (ASCORBIC ACID) 500 MG tablet Take 1,500 mg by mouth at bedtime.     Allergies:   Patient has no known allergies.   Social History   Socioeconomic History  . Marital status: Married    Spouse name: Not on file  . Number of children: Not on file  . Years of education: Not on file  . Highest education level: Not on file  Occupational History  . Not on file  Tobacco Use  . Smoking status: Never  Smoker  . Smokeless tobacco: Never Used  Vaping Use  . Vaping Use: Never used  Substance and Sexual Activity  . Alcohol use: No  . Drug use: No  . Sexual activity: Yes    Partners: Female  Other Topics Concern  . Not on file  Social History Narrative   Marital status/children/pets: Married   Education/employment: retired   Investment banker, operational of Radio broadcast assistant Strain:   . Difficulty of Paying Living Expenses: Not on file  Food Insecurity:   . Worried About Charity fundraiser in the Last Year: Not on file  . Ran Out of Food in the Last Year: Not on file  Transportation Needs:   . Lack of Transportation (Medical): Not on file  . Lack of Transportation (Non-Medical): Not on file  Physical Activity:   . Days of Exercise per Week: Not on file  . Minutes of Exercise per Session: Not on file  Stress:   . Feeling of Stress : Not on file  Social Connections:   . Frequency of Communication with Friends and Family: Not on file  . Frequency of Social Gatherings with Friends and Family: Not on file  . Attends Religious Services: Not on file  . Active Member of Clubs or Organizations: Not on file  . Attends Archivist Meetings: Not on file  . Marital Status: Not on file     Family History: The patient's family history includes COPD in his sister; Diabetes in his brother and sister; Heart disease (age of onset: 64) in his mother; Stroke (age of onset: 80) in his father.  ROS:   Please see the history of present illness.    All other systems reviewed and are negative.  EKGs/Labs/Other Studies Reviewed:    The following studies were reviewed today: IMPRESSIONS    1. The left ventricle has normal systolic function with an ejection  fraction of 60-65%. The cavity size was normal. Left ventricular diastolic  Doppler parameters are consistent with pseudonormalization.  2. The right ventricle has normal systolic function. The cavity was  normal. There is no  increase in right ventricular wall thickness. Right  ventricular systolic pressure is normal with an estimated pressure of 27.6  mmHg.  3. Left atrial size was mild-moderately dilated.  4. The aortic valve has an indeterminate number of cusps but probably  tricuspid. The valve is moderately thickened and severely calcificed with  mildly restricted motion. There is mild aortic stenosis. There is mild AI.  The planimetered AVA was 1.7m2.  5. The aorta is normal unless otherwise noted.  6. Suspicious for PFO by colorflow doppler. Recommend agitated saline  contrast injection.    Recent Labs: 01/21/2020: ALT 19; BUN 37; Creatinine, Ser 0.76; Hemoglobin 12.3; Platelets 183; Potassium 4.6; Sodium 140; TSH 4.350  Recent Lipid Panel    Component Value Date/Time   CHOL 137 01/21/2020 0840   TRIG 46 01/21/2020 0840   HDL 72 01/21/2020 0840   CHOLHDL 1.9 01/21/2020 0840   CHOLHDL 2.2 04/30/2019 0459   VLDL 7 04/30/2019 0459   LDLCALC 54 01/21/2020 0840   LDLDIRECT 109.8 06/05/2011 0959    Physical Exam:    VS:  BP 130/62 (BP Location: Right Arm, Patient Position: Sitting, Cuff Size: Normal)   Pulse 67   Ht 5\' 9"  (1.753 m)   Wt 186 lb (84.4 kg)   SpO2 97%   BMI 27.47 kg/m     Wt Readings from Last 3 Encounters:  06/16/20 186 lb (84.4 kg)  01/21/20 185 lb (83.9 kg)  01/18/20 185 lb 6 oz (84.1 kg)     GEN: Patient is in no acute distress HEENT: Normal NECK: No JVD; No carotid bruits LYMPHATICS: No lymphadenopathy CARDIAC: Hear sounds regular, 2/6 systolic murmur at the apex. RESPIRATORY:  Clear to auscultation without rales, wheezing or rhonchi  ABDOMEN: Soft, non-tender, non-distended MUSCULOSKELETAL:  No edema; No deformity  SKIN: Warm and dry NEUROLOGIC:  Alert and oriented x 3 PSYCHIATRIC:  Normal affect   Signed, Jenean Lindau, MD  06/16/2020 9:44 AM    [facility]

## 2020-08-08 ENCOUNTER — Other Ambulatory Visit: Payer: Self-pay | Admitting: Family Medicine

## 2020-08-15 DIAGNOSIS — G5792 Unspecified mononeuropathy of left lower limb: Secondary | ICD-10-CM | POA: Insufficient documentation

## 2020-08-15 DIAGNOSIS — R112 Nausea with vomiting, unspecified: Secondary | ICD-10-CM | POA: Insufficient documentation

## 2020-08-15 DIAGNOSIS — Z8669 Personal history of other diseases of the nervous system and sense organs: Secondary | ICD-10-CM | POA: Insufficient documentation

## 2020-08-15 DIAGNOSIS — J189 Pneumonia, unspecified organism: Secondary | ICD-10-CM | POA: Insufficient documentation

## 2020-08-15 DIAGNOSIS — R519 Headache, unspecified: Secondary | ICD-10-CM | POA: Insufficient documentation

## 2020-08-16 ENCOUNTER — Other Ambulatory Visit: Payer: Self-pay

## 2020-08-16 ENCOUNTER — Encounter: Payer: Self-pay | Admitting: Cardiology

## 2020-08-16 ENCOUNTER — Ambulatory Visit: Payer: PPO | Admitting: Cardiology

## 2020-08-16 VITALS — BP 138/70 | HR 70 | Ht 69.0 in | Wt 181.0 lb

## 2020-08-16 DIAGNOSIS — E785 Hyperlipidemia, unspecified: Secondary | ICD-10-CM | POA: Diagnosis not present

## 2020-08-16 DIAGNOSIS — I10 Essential (primary) hypertension: Secondary | ICD-10-CM | POA: Diagnosis not present

## 2020-08-16 DIAGNOSIS — I350 Nonrheumatic aortic (valve) stenosis: Secondary | ICD-10-CM

## 2020-08-16 DIAGNOSIS — I6523 Occlusion and stenosis of bilateral carotid arteries: Secondary | ICD-10-CM | POA: Diagnosis not present

## 2020-08-16 DIAGNOSIS — R002 Palpitations: Secondary | ICD-10-CM | POA: Diagnosis not present

## 2020-08-16 NOTE — Patient Instructions (Signed)

## 2020-08-16 NOTE — Progress Notes (Signed)
Cardiology Office Note:    Date:  08/16/2020   ID:  [name] [name], DOB Jan 06, 1936, MRN [id number]  PCP:  Ma Hillock, DO  Cardiologist:  Jenean Lindau, MD   Referring MD: Ma Hillock, DO    ASSESSMENT:    1. Essential hypertension   2. Aortic valve stenosis, etiology of cardiac valve disease unspecified   3. Atherosclerosis of both carotid arteries   4. Dyslipidemia    PLAN:    In order of problems listed above:  1. Primary prevention stressed with the patient.  Importance of compliance with diet medication stressed any vocalized understanding.  He exercises using a bicycle half an hour a day 5 days a week.  He is asymptomatic. 2. Mild aortic stenosis: Medical management at this time.  Asymptomatic. 3. Essential hypertension: Blood pressure stable and diet was emphasized. 4. Mixed dyslipidemia: He will be back in the next few days for lipid work.  Past blood work was reviewed and discussed with the patient at length. 5. Patient will be seen in follow-up appointment in 6 months or earlier if the patient has any concerns    Medication Adjustments/Labs and Tests Ordered: Current medicines are reviewed at length with the patient today.  Concerns regarding medicines are outlined above.  No orders of the defined types were placed in this encounter.  No orders of the defined types were placed in this encounter.    No chief complaint on file.    History of Present Illness:    [name] [name] is a 84 y.o. male.  Patient has past medical history of essential hypertension dyslipidemia mild aortic stenosis.  He denies any problems at this time and takes care of activities of daily living.  No chest pain orthopnea or PND.  No dizziness.  At the time of my evaluation, the patient is alert awake oriented and in no distress.  Past Medical History:  Diagnosis Date  . Abnormal ankle brachial index (ABI) 07/05/2019   ABI of the toe, at home health visit was abnormal for  severe PAD-reading obtained left large toe which has callus formation.  . Abnormal echocardiogram 05/13/2019  . ALLERGIC RHINITIS 04/23/2007  . Allergic rhinitis 04/23/2007   Qualifier: Diagnosis of  By: Paulina Fusi RN, Daine Gravel   . Aortic valve calcification 05/13/2019  . Aortic valve regurgitation 05/13/2019  . Aortic valve stenosis 05/13/2019  . BENIGN PROSTATIC HYPERTROPHY 04/23/2007   resolved after TURP  . BPH (benign prostatic hyperplasia) 04/23/2007   Qualifier: Diagnosis of  By: Paulina Fusi RN, Daine Gravel   . Carotid atherosclerosis 01/21/2020  . Cellulitis june 2013   left side("left flank")  . CHEST PAIN 02/04/2008  . Chronic cough 07/05/2019  . Chronic pain 04/29/2019  . Degeneration of lumbar intervertebral disc 12/09/2019  . Dyslipidemia 04/23/2007   Qualifier: Diagnosis of  By: Paulina Fusi RN, Daine Gravel   . Elevated TSH 05/13/2019  . Essential hypertension 06/02/2008   Qualifier: Diagnosis of  By: Burnice Logan  MD, Doretha Sou   . H/O blurred vision    both eyes, occasional  . Headache   . HYPERLIPIDEMIA 04/23/2007  . HYPERTENSION 06/02/2008  . Impaired glucose tolerance 04/01/2016  . LAE (left atrial enlargement) 05/13/2019  . NEPHROLITHIASIS, HX OF 04/23/2007   lithotripsy- fragment remains, no symptoms   . 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 Neuropathy of left foot   . OSTEOARTHRITIS 04/23/2007  . Osteoarthritis 04/23/2007   Qualifier: Diagnosis  of  By: Paulina Fusi RN, Daine Gravel   . Palpitations 02/04/2008   Qualifier: Diagnosis of  By: Burnice Logan  MD, Doretha Sou   . Pneumonia   . PONV (postoperative nausea and vomiting)   . RECTAL BLEEDING 02/04/2008   no recent problems  . 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 Sensorineural hearing loss (SNHL), bilateral 06/14/2016  . TIA (transient ischemic attack) 05/13/2019  . 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 Visual field defect 04/29/2019    Past Surgical History:  Procedure Laterality  Date  . APPENDECTOMY    . CATARACT EXTRACTION Bilateral 04/2020  . COLONOSCOPY    . HERNIA REPAIR    . left foot surgery for fx  Jul 30, 2011  . LITHOTRIPSY  1992   cystoscopy with stent placement also done(1 stone fragment remains)  . right shoulder replacement  Sep 28, 2009  . TOTAL KNEE ARTHROPLASTY Right 08/30/2013   Procedure: RIGHT TOTAL KNEE ARTHROPLASTY RIGHT ;  Surgeon: Gearlean Alf, MD;  Location: [facility];  Service: Orthopedics;  Laterality: Right;  . TOTAL SHOULDER ARTHROPLASTY Left 02/02/2015   Procedure: LEFT TOTAL SHOULDER ARTHROPLASTY;  Surgeon: Justice Britain, MD;  Location: [facility];  Service: Orthopedics;  Laterality: Left;  . TRANSURETHRAL RESECTION OF PROSTATE  03/27/2012   Procedure: TRANSURETHRAL RESECTION OF THE PROSTATE WITH GYRUS INSTRUMENTS;  Surgeon: Claybon Jabs, MD;  Location: [facility];  Service: Urology;;    Current Medications: Current Meds  Medication Sig  . aspirin 81 MG chewable tablet 81 mg daily.  . celecoxib (CELEBREX) 200 MG capsule TAKE ONE CAPSULE BY MOUTH ONE TIME DAILY   . Coenzyme Q10 (COQ10) 200 MG CAPS Take 200 mg by mouth at bedtime.  . fexofenadine (ALLEGRA ALLERGY) 180 MG tablet Allegra Allergy  . fluticasone (FLONASE) 50 MCG/ACT nasal spray PLACE 1 SPRAY INTO BOTH NOSTRILS DAILY.  Marland Kitchen gabapentin (NEURONTIN) 600 MG tablet Take 1 tablet (600 mg total) by mouth 2 (two) times daily.  Javier Docker Oil 1000 MG CAPS Take 2,000 mg by mouth daily with breakfast.  . losartan (COZAAR) 50 MG tablet Take 1 tablet (50 mg total) by mouth daily.  . Multiple Vitamin (MULTIVITAMIN) tablet Take 1 tablet by mouth daily.  . NON FORMULARY Take 1 capsule by mouth See admin instructions. MedOp MaxiVision Whole Body Formula capsules: Take 1 capsule by mouth two times a day  . NON FORMULARY Take 1 packet by mouth See admin instructions. Relief Factor: Dissolve 1 packet into preferred beverage and drink two times a day  . NON FORMULARY Take 2 capsules by mouth See admin  instructions. Clear Lungs capsules: Take 2 capsules by mouth in the morning  . potassium citrate (UROCIT-K) 10 MEQ (1080 MG) SR tablet TAKE 2 TABLETS BY MOUTH  TWICE DAILY WITH A MEAL  . Psyllium (METAMUCIL FIBER PO) Take 2 capsules by mouth daily with breakfast.  . simvastatin (ZOCOR) 40 MG tablet TAKE 1 TABLET BY MOUTH EVERYDAY AT BEDTIME  . vitamin C (ASCORBIC ACID) 500 MG tablet Take 1,500 mg by mouth at bedtime.     Allergies:   Patient has no known allergies.   Social History   Socioeconomic History  . Marital status: Married    Spouse name: Not on file  . Number of children: Not on file  . Years of education: Not on file  . Highest education level: Not on file  Occupational History  . Not on file  Tobacco Use  . Smoking status: Never Smoker  . Smokeless tobacco: Never Used  Vaping Use  . Vaping Use: Never used  Substance and Sexual Activity  . Alcohol use: No  . Drug use: No  . Sexual activity: Yes    Partners: Female  Other Topics Concern  . Not on file  Social History Narrative   Marital status/children/pets: Married   Education/employment: retired   Investment banker, operational of Radio broadcast assistant Strain:   . Difficulty of Paying Living Expenses: Not on file  Food Insecurity:   . Worried About Charity fundraiser in the Last Year: Not on file  . Ran Out of Food in the Last Year: Not on file  Transportation Needs:   . Lack of Transportation (Medical): Not on file  . Lack of Transportation (Non-Medical): Not on file  Physical Activity:   . Days of Exercise per Week: Not on file  . Minutes of Exercise per Session: Not on file  Stress:   . Feeling of Stress : Not on file  Social Connections:   . Frequency of Communication with Friends and Family: Not on file  . Frequency of Social Gatherings with Friends and Family: Not on file  . Attends Religious Services: Not on file  . Active Member of Clubs or Organizations: Not on file  . Attends Theatre manager Meetings: Not on file  . Marital Status: Not on file     Family History: The patient's family history includes COPD in his sister; Diabetes in his brother and sister; Heart disease (age of onset: 65) in his mother; Stroke (age of onset: 27) in his father.  ROS:   Please see the history of present illness.    All other systems reviewed and are negative.  EKGs/Labs/Other Studies Reviewed:    The following studies were reviewed today: I discussed my findings with the patient at extensive length.   Recent Labs: 01/21/2020: ALT 19; BUN 37; Creatinine, Ser 0.76; Hemoglobin 12.3; Platelets 183; Potassium 4.6; Sodium 140; TSH 4.350  Recent Lipid Panel    Component Value Date/Time   CHOL 137 01/21/2020 0840   TRIG 46 01/21/2020 0840   HDL 72 01/21/2020 0840   CHOLHDL 1.9 01/21/2020 0840   CHOLHDL 2.2 04/30/2019 0459   VLDL 7 04/30/2019 0459   LDLCALC 54 01/21/2020 0840   LDLDIRECT 109.8 06/05/2011 0959    Physical Exam:    VS:  BP 138/70   Pulse 70   Ht 5\' 9"  (1.753 m)   Wt 181 lb (82.1 kg)   SpO2 99%   BMI 26.73 kg/m     Wt Readings from Last 3 Encounters:  08/16/20 181 lb (82.1 kg)  06/16/20 186 lb (84.4 kg)  01/21/20 185 lb (83.9 kg)     GEN: Patient is in no acute distress HEENT: Normal NECK: No JVD; No carotid bruits LYMPHATICS: No lymphadenopathy CARDIAC: Hear sounds regular, 2/6 systolic murmur at the apex. RESPIRATORY:  Clear to auscultation without rales, wheezing or rhonchi  ABDOMEN: Soft, non-tender, non-distended MUSCULOSKELETAL:  No edema; No deformity  SKIN: Warm and dry NEUROLOGIC:  Alert and oriented x 3 PSYCHIATRIC:  Normal affect   Signed, Jenean Lindau, MD  08/16/2020 8:41 AM    [facility]

## 2020-08-17 DIAGNOSIS — E785 Hyperlipidemia, unspecified: Secondary | ICD-10-CM | POA: Diagnosis not present

## 2020-08-17 DIAGNOSIS — I10 Essential (primary) hypertension: Secondary | ICD-10-CM | POA: Diagnosis not present

## 2020-08-17 DIAGNOSIS — R002 Palpitations: Secondary | ICD-10-CM | POA: Diagnosis not present

## 2020-08-18 ENCOUNTER — Telehealth: Payer: Self-pay | Admitting: Cardiology

## 2020-08-18 LAB — CBC WITH DIFFERENTIAL/PLATELET
Basophils Absolute: 0.1 10*3/uL (ref 0.0–0.2)
Basos: 1 %
EOS (ABSOLUTE): 0.3 10*3/uL (ref 0.0–0.4)
Eos: 5 %
Hematocrit: 41.2 % (ref 37.5–51.0)
Hemoglobin: 13.6 g/dL (ref 13.0–17.7)
Immature Grans (Abs): 0 10*3/uL (ref 0.0–0.1)
Immature Granulocytes: 0 %
Lymphocytes Absolute: 1.3 10*3/uL (ref 0.7–3.1)
Lymphs: 22 %
MCH: 30.5 pg (ref 26.6–33.0)
MCHC: 33 g/dL (ref 31.5–35.7)
MCV: 92 fL (ref 79–97)
Monocytes Absolute: 0.5 10*3/uL (ref 0.1–0.9)
Monocytes: 9 %
Neutrophils Absolute: 3.9 10*3/uL (ref 1.4–7.0)
Neutrophils: 63 %
Platelets: 197 10*3/uL (ref 150–450)
RBC: 4.46 x10E6/uL (ref 4.14–5.80)
RDW: 12.5 % (ref 11.6–15.4)
WBC: 6.1 10*3/uL (ref 3.4–10.8)

## 2020-08-18 LAB — HEPATIC FUNCTION PANEL
ALT: 16 IU/L (ref 0–44)
AST: 19 IU/L (ref 0–40)
Albumin: 4.9 g/dL — ABNORMAL HIGH (ref 3.6–4.6)
Alkaline Phosphatase: 69 IU/L (ref 44–121)
Bilirubin Total: 0.7 mg/dL (ref 0.0–1.2)
Bilirubin, Direct: 0.22 mg/dL (ref 0.00–0.40)
Total Protein: 7.1 g/dL (ref 6.0–8.5)

## 2020-08-18 LAB — BASIC METABOLIC PANEL
BUN/Creatinine Ratio: 35 — ABNORMAL HIGH (ref 10–24)
BUN: 33 mg/dL — ABNORMAL HIGH (ref 8–27)
CO2: 23 mmol/L (ref 20–29)
Calcium: 9.9 mg/dL (ref 8.6–10.2)
Chloride: 102 mmol/L (ref 96–106)
Creatinine, Ser: 0.94 mg/dL (ref 0.76–1.27)
GFR calc Af Amer: 88 mL/min/{1.73_m2} (ref >59)
GFR calc non Af Amer: 76 mL/min/{1.73_m2} (ref >59)
Glucose: 97 mg/dL (ref 65–99)
Potassium: 4.8 mmol/L (ref 3.5–5.2)
Sodium: 141 mmol/L (ref 134–144)

## 2020-08-18 LAB — LIPID PANEL
Chol/HDL Ratio: 2.3 ratio (ref 0.0–5.0)
Cholesterol, Total: 159 mg/dL (ref 100–199)
HDL: 68 mg/dL (ref >39)
LDL Chol Calc (NIH): 80 mg/dL (ref 0–99)
Triglycerides: 51 mg/dL (ref 0–149)
VLDL Cholesterol Cal: 11 mg/dL (ref 5–40)

## 2020-08-18 LAB — TSH: TSH: 4.64 u[IU]/mL — ABNORMAL HIGH (ref 0.450–4.500)

## 2020-08-18 NOTE — Telephone Encounter (Signed)
Patient returning Shonda's call for results, connected call to [facility]

## 2020-08-22 LAB — HM DIABETES FOOT EXAM: HM Diabetic Foot Exam: NORMAL

## 2020-08-23 ENCOUNTER — Ambulatory Visit (INDEPENDENT_AMBULATORY_CARE_PROVIDER_SITE_OTHER): Payer: PPO

## 2020-08-23 VITALS — Ht 69.0 in | Wt 177.0 lb

## 2020-08-23 DIAGNOSIS — Z0000 Encounter for general adult medical examination without abnormal findings: Secondary | ICD-10-CM | POA: Diagnosis not present

## 2020-08-23 NOTE — Progress Notes (Signed)
Subjective:   [name] [name] is a 84 y.o. male who presents for Medicare Annual/Subsequent preventive examination.  I connected with [name] [name] today by telephone and verified that I am speaking with the correct person using two identifiers. Location patient: home Location provider: work Persons participating in the virtual visit: patient, Marine scientist.    I discussed the limitations, risks, security and privacy concerns of performing an evaluation and management service by telephone and the availability of in person appointments. I also discussed with the patient that there may be a patient responsible charge related to this service. The patient expressed understanding and verbally consented to this telephonic visit.    Interactive audio and video telecommunications were attempted between this provider and patient, however failed, due to patient having technical difficulties OR patient did not have access to video capability.  We continued and completed visit with audio only.  Some vital signs may be absent or patient reported.   Time Spent with patient on telephone encounter: 20 minutes    Review of Systems     Cardiac Risk Factors include: advanced age (>36men, >31 women);male gender;dyslipidemia;hypertension     Objective:    Today's Vitals   08/23/20 1436  Weight: 177 lb (80.3 kg)  Height: 5\' 9"  (1.753 m)   Body mass index is 26.14 kg/m.  Advanced Directives 08/23/2020 10/12/2019 04/29/2019 03/12/2017 09/23/2016 02/12/2016 02/11/2016  Does Patient Have a Medical Advance Directive? Yes Yes No No Yes No No  Type of Paramedic of [place];Living will [employer];Living will - - - - -  Does patient want to make changes to medical advance directive? - - - - - - -  Copy of Longfellow in Chart? Yes - validated most recent copy scanned in chart (See row information) - - - - - -  Would patient like information on creating a medical  advance directive? - - - - - No - patient declined information No - patient declined information  Pre-existing out of facility DNR order (yellow form or pink MOST form) - - - - - - -    Current Medications (verified) Outpatient Encounter Medications as of 08/23/2020  Medication Sig  . aspirin 81 MG chewable tablet 81 mg daily.  . celecoxib (CELEBREX) 200 MG capsule TAKE ONE CAPSULE BY MOUTH ONE TIME DAILY   . Coenzyme Q10 (COQ10) 200 MG CAPS Take 200 mg by mouth at bedtime.  . fexofenadine (ALLEGRA ALLERGY) 180 MG tablet Allegra Allergy  . fluticasone (FLONASE) 50 MCG/ACT nasal spray PLACE 1 SPRAY INTO BOTH NOSTRILS DAILY.  Marland Kitchen gabapentin (NEURONTIN) 600 MG tablet Take 1 tablet (600 mg total) by mouth 2 (two) times daily.  Javier Docker Oil 1000 MG CAPS Take 2,000 mg by mouth daily with breakfast.  . losartan (COZAAR) 50 MG tablet Take 1 tablet (50 mg total) by mouth daily.  . Multiple Vitamin (MULTIVITAMIN) tablet Take 1 tablet by mouth daily.  . NON FORMULARY Take 1 capsule by mouth See admin instructions. MedOp MaxiVision Whole Body Formula capsules: Take 1 capsule by mouth two times a day  . NON FORMULARY Take 1 packet by mouth See admin instructions. Relief Factor: Dissolve 1 packet into preferred beverage and drink two times a day  . NON FORMULARY Take 2 capsules by mouth See admin instructions. Clear Lungs capsules: Take 2 capsules by mouth in the morning  . potassium citrate (UROCIT-K) 10 MEQ (1080 MG) SR tablet TAKE 2 TABLETS BY MOUTH  TWICE DAILY WITH A MEAL  . Psyllium (METAMUCIL FIBER PO) Take 2 capsules by mouth daily with breakfast.  . simvastatin (ZOCOR) 40 MG tablet TAKE 1 TABLET BY MOUTH EVERYDAY AT BEDTIME  . vitamin C (ASCORBIC ACID) 500 MG tablet Take 1,500 mg by mouth at bedtime.   No facility-administered encounter medications on file as of 08/23/2020.    Allergies (verified) Patient has no known allergies.   History: Past Medical History:  Diagnosis Date  . Abnormal  ankle brachial index (ABI) 07/05/2019   ABI of the toe, at home health visit was abnormal for severe PAD-reading obtained left large toe which has callus formation.  . Abnormal echocardiogram 05/13/2019  . ALLERGIC RHINITIS 04/23/2007  . Allergic rhinitis 04/23/2007   Qualifier: Diagnosis of  By: Paulina Fusi RN, Daine Gravel   . Aortic valve calcification 05/13/2019  . Aortic valve regurgitation 05/13/2019  . Aortic valve stenosis 05/13/2019  . BENIGN PROSTATIC HYPERTROPHY 04/23/2007   resolved after TURP  . BPH (benign prostatic hyperplasia) 04/23/2007   Qualifier: Diagnosis of  By: Paulina Fusi RN, Daine Gravel   . Carotid atherosclerosis 01/21/2020  . Cellulitis june 2013   left side("left flank")  . CHEST PAIN 02/04/2008  . Chronic cough 07/05/2019  . Chronic pain 04/29/2019  . Degeneration of lumbar intervertebral disc 12/09/2019  . Dyslipidemia 04/23/2007   Qualifier: Diagnosis of  By: Paulina Fusi RN, Daine Gravel   . Elevated TSH 05/13/2019  . Essential hypertension 06/02/2008   Qualifier: Diagnosis of  By: Burnice Logan  MD, Doretha Sou   . H/O blurred vision    both eyes, occasional  . Headache   . HYPERLIPIDEMIA 04/23/2007  . HYPERTENSION 06/02/2008  . Impaired glucose tolerance 04/01/2016  . LAE (left atrial enlargement) 05/13/2019  . NEPHROLITHIASIS, HX OF 04/23/2007   lithotripsy- fragment remains, no symptoms   . 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 Neuropathy of left foot   . OSTEOARTHRITIS 04/23/2007  . Osteoarthritis 04/23/2007   Qualifier: Diagnosis of  By: Paulina Fusi RN, Daine Gravel   . Palpitations 02/04/2008   Qualifier: Diagnosis of  By: Burnice Logan  MD, Doretha Sou   . Pneumonia   . PONV (postoperative nausea and vomiting)   . RECTAL BLEEDING 02/04/2008   no recent problems  . 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 Sensorineural hearing loss (SNHL), bilateral 06/14/2016  . TIA (transient ischemic attack) 05/13/2019  . 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 Visual field defect 04/29/2019   Past Surgical History:  Procedure Laterality Date  . APPENDECTOMY    . CATARACT EXTRACTION Bilateral 04/2020  . COLONOSCOPY    . HERNIA REPAIR    . left foot surgery for fx  Jul 30, 2011  . LITHOTRIPSY  1992   cystoscopy with stent placement also done(1 stone fragment remains)  . right shoulder replacement  Sep 28, 2009  . TOTAL KNEE ARTHROPLASTY Right 08/30/2013   Procedure: RIGHT TOTAL KNEE ARTHROPLASTY RIGHT ;  Surgeon: Gearlean Alf, MD;  Location: [facility];  Service: Orthopedics;  Laterality: Right;  . TOTAL SHOULDER ARTHROPLASTY Left 02/02/2015   Procedure: LEFT TOTAL SHOULDER ARTHROPLASTY;  Surgeon: Justice Britain, MD;  Location: [facility];  Service: Orthopedics;  Laterality: Left;  . TRANSURETHRAL RESECTION OF PROSTATE  03/27/2012   Procedure: TRANSURETHRAL RESECTION OF THE PROSTATE WITH GYRUS INSTRUMENTS;  Surgeon: Claybon Jabs, MD;  Location: [facility];  Service: Urology;;  Family History  Problem Relation Age of Onset  . Heart disease Mother 54       MI  . Stroke Father 29  . COPD Sister   . Diabetes Sister   . Diabetes Brother    Social History   Socioeconomic History  . Marital status: Married    Spouse name: Not on file  . Number of children: Not on file  . Years of education: Not on file  . Highest education level: Not on file  Occupational History  . Not on file  Tobacco Use  . Smoking status: Never Smoker  . Smokeless tobacco: Never Used  Vaping Use  . Vaping Use: Never used  Substance and Sexual Activity  . Alcohol use: No  . Drug use: No  . Sexual activity: Yes    Partners: Female  Other Topics Concern  . Not on file  Social History Narrative   Marital status/children/pets: Married   Education/employment: retired   Scientist, physiological Strain: [place]   . Difficulty of Paying Living Expenses: Not hard at all  Food Insecurity: No Food Insecurity  . Worried About Ship broker in the Last Year: Never true  . Ran Out of Food in the Last Year: Never true  Transportation Needs: No Transportation Needs  . Lack of Transportation (Medical): No  . Lack of Transportation (Non-Medical): No  Physical Activity: Sufficiently Active  . Days of Exercise per Week: 5 days  . Minutes of Exercise per Session: 40 min  Stress: No Stress Concern Present  . Feeling of Stress : Not at all  Social Connections: Moderately Integrated  . Frequency of Communication with Friends and Family: More than three times a week  . Frequency of Social Gatherings with Friends and Family: More than three times a week  . Attends Religious Services: More than 4 times per year  . Active Member of Clubs or Organizations: No  . Attends Archivist Meetings: Never  . Marital Status: Married    Tobacco Counseling Counseling given: Not Answered   Clinical Intake:  Pre-visit preparation completed: Yes  Pain : No/denies pain     Nutritional Status: BMI 25 -29 Overweight Nutritional Risks: None Diabetes: No  How often do you need to have someone help you when you read instructions, pamphlets, or other written materials from your doctor or pharmacy?: 1 - Never What is the last grade level you completed in school?: 12th grade  Diabetic?No  Interpreter Needed?: No  Information entered by :: Caroleen Hamman LPN   Activities of Daily Living In your present state of health, do you have any difficulty performing the following activities: 08/23/2020  Hearing? Y  Comment left ear  Vision? N  Difficulty concentrating or making decisions? N  Walking or climbing stairs? N  Dressing or bathing? N  Doing errands, shopping? N  Preparing Food and eating ? N  Using the Toilet? N  In the past six months, have you accidently leaked urine? N  Do you have problems with loss of bowel control? N  Managing your Medications? N  Managing your Finances? N  Housekeeping or managing your  Housekeeping? N  Some recent data might be hidden    Patient Care Team: Ma Hillock, DO as PCP - General (Family Medicine) [name], MD as Consulting Physician (Dermatology) Revankar, Reita Cliche, MD as Consulting Physician (Cardiology) Orth[facility], [facility] (Specialist) Odette Fraction [facility])  Indicate any recent Medical  Services you may have received from other than [facility] providers in the past year (date may be approximate).     Assessment:   This is a routine wellness examination for [name] [name].  Hearing/Vision screen  Hearing Screening   125Hz  250Hz  500Hz  1000Hz  2000Hz  3000Hz  4000Hz  6000Hz  8000Hz   Right ear:           Left ear:           Comments: Hearing aids-Deaf in left ear  Vision Screening Comments: Reading glasses Last eye exam-05/2020  Dietary issues and exercise activities discussed: Current Exercise Habits: Home exercise routine, Type of exercise: Other - see comments (exercise bike), Time (Minutes): 40, Frequency (Times/Week): 5, Weekly Exercise (Minutes/Week): 200, Intensity: Mild, Exercise limited by: None identified  Goals    . Patient Stated     Continue current level of activity      Depression Screen PHQ 2/9 Scores 08/23/2020 01/19/2019 04/09/2018 04/01/2016 09/13/2014  PHQ - 2 Score 0 0 0 0 0    Fall Risk Fall Risk  08/23/2020 06/30/2019 01/19/2019 04/09/2018 04/01/2016  Falls in the past year? 0 - 0 No No  Number falls in past yr: 0 - 0 - -  Injury with Fall? 0 - - - -  Follow up Falls prevention discussed Falls evaluation completed - - -  Comment - Completed by [employer] insurance home visit - - -    FALL RISK PREVENTION PERTAINING TO THE HOME:  Any stairs in or around the home? Yes  If so, are there any without handrails? No  Home free of loose throw rugs in walkways, pet beds, electrical cords, etc? Yes  Adequate lighting in your home to reduce risk of falls? Yes   ASSISTIVE DEVICES UTILIZED TO PREVENT FALLS:  Life alert? No  Use of a cane,  walker or w/c? No  Grab bars in the bathroom? Yes  Shower chair or bench in shower? No  Elevated toilet seat or a handicapped toilet? No   TIMED UP AND GO:  Was the test performed? No . Phone visit   Cognitive Function:Normal cognitive status assessed by  this Nurse Health Advisor. No abnormalities found.          Immunizations Immunization History  Administered Date(s) Administered  . Fluad Quad(high Dose 65+) 05/27/2019  . Influenza Split 06/12/2011  . Influenza Whole 05/31/2010  . Influenza, High Dose Seasonal PF 09/07/2014, 07/10/2016, 05/14/2017, 06/28/2018  . Influenza-Unspecified 09/06/2014, 05/19/2017, 07/03/2020  . PFIZER SARS-COV-2 Vaccination 11/08/2019, 11/29/2019, 06/19/2020  . Pneumococcal Conjugate-13 09/07/2014  . Pneumococcal Polysaccharide-23 05/14/2017  . Td 05/16/2009  . Tdap 02/11/2016  . Zoster 06/12/2011    TDAP status: Up to date  Flu Vaccine status: Up to date  Pneumococcal vaccine status: Up to date  Covid-19 vaccine status: Completed vaccines  Qualifies for Shingles Vaccine? Yes   Zostavax completed Yes   Shingrix Completed?: No.    Education has been provided regarding the importance of this vaccine. Patient has been advised to call insurance company to determine out of pocket expense if they have not yet received this vaccine. Advised may also receive vaccine at local pharmacy or Health Dept. Verbalized acceptance and understanding.  Screening Tests Health Maintenance  Topic Date Due  . TETANUS/TDAP  02/10/2026  . INFLUENZA VACCINE  Completed  . COVID-19 Vaccine  Completed  . PNA vac Low Risk Adult  Completed    Health Maintenance  There are no preventive care reminders to display for this patient.  Colorectal cancer  screening: No longer required.   Lung Cancer Screening: (Low Dose CT Chest recommended if Age 56-80 years, 30 pack-year currently smoking OR have quit w/in 15years.) does not qualify.     Additional  Screening:  Hepatitis C Screening: does not qualify  Vision Screening: Recommended annual ophthalmology exams for early detection of glaucoma and other disorders of the eye. Is the patient up to date with their annual eye exam?  Yes  Who is the provider or what is the name of the office in which the patient attends annual eye exams? Dr. Joya San   Dental Screening: Recommended annual dental exams for proper oral hygiene  Community Resource Referral / Chronic Care Management: CRR required this visit?  No   CCM required this visit?  No      Plan:     I have personally reviewed and noted the following in the patient's chart:   . Medical and social history . Use of alcohol, tobacco or illicit drugs  . Current medications and supplements . Functional ability and status . Nutritional status . Physical activity . Advanced directives . List of other physicians . Hospitalizations, surgeries, and ER visits in previous 12 months . Vitals . Screenings to include cognitive, depression, and falls . Referrals and appointments  In addition, I have reviewed and discussed with patient certain preventive protocols, quality metrics, and best practice recommendations. A written personalized care plan for preventive services as well as general preventive health recommendations were provided to patient.   Due to this being a telephonic visit, the after visit summary with patients personalized plan was offered to patient via mail or my-chart.  Patient would like to access on my-chart.  Marta Antu, LPN   [id number]  Nurse Health Advisor  Nurse Notes: None

## 2020-08-23 NOTE — Patient Instructions (Signed)
Mr. [name] [name] , Thank you for taking time to complete your Medicare Wellness Visit. I appreciate your ongoing commitment to your health goals. Please review the following plan we discussed and let me know if I can assist you in the future.   Screening recommendations/referrals: Colonoscopy: No longer required Recommended yearly ophthalmology/optometry visit for glaucoma screening and checkup Recommended yearly dental visit for hygiene and checkup  Vaccinations: Influenza vaccine: Up to date Pneumococcal vaccine: Completed vaccines Tdap vaccine: Up to date-Due-02/10/2026 Shingles vaccine: Discuss with pharmacy   Covid-19: Completed vaccines  Advanced directives: Copy in chart  Conditions/risks identified: See problem list  Next appointment: Follow up in one year for your annual wellness visit. 08/29/2021 @ 3:00  Preventive Care 65 Years and Older, Male Preventive care refers to lifestyle choices and visits with your health care provider that can promote health and wellness. What does preventive care include?  A yearly physical exam. This is also called an annual well check.  Dental exams once or twice a year.  Routine eye exams. Ask your health care provider how often you should have your eyes checked.  Personal lifestyle choices, including:  Daily care of your teeth and gums.  Regular physical activity.  Eating a healthy diet.  Avoiding tobacco and drug use.  Limiting alcohol use.  Practicing safe sex.  Taking low doses of aspirin every day.  Taking vitamin and mineral supplements as recommended by your health care provider. What happens during an annual well check? The services and screenings done by your health care provider during your annual well check will depend on your age, overall health, lifestyle risk factors, and family history of disease. Counseling  Your health care provider may ask you questions about your:  Alcohol use.  Tobacco use.  Drug  use.  Emotional well-being.  Home and relationship well-being.  Sexual activity.  Eating habits.  History of falls.  Memory and ability to understand (cognition).  Work and work Statistician. Screening  You may have the following tests or measurements:  Height, weight, and BMI.  Blood pressure.  Lipid and cholesterol levels. These may be checked every 5 years, or more frequently if you are over 68 years old.  Skin check.  Lung cancer screening. You may have this screening every year starting at age 68 if you have a 30-pack-year history of smoking and currently smoke or have quit within the past 15 years.  Fecal occult blood test (FOBT) of the stool. You may have this test every year starting at age 61.  Flexible sigmoidoscopy or colonoscopy. You may have a sigmoidoscopy every 5 years or a colonoscopy every 10 years starting at age 78.  Prostate cancer screening. Recommendations will vary depending on your family history and other risks.  Hepatitis C blood test.  Hepatitis B blood test.  Sexually transmitted disease (STD) testing.  Diabetes screening. This is done by checking your blood sugar (glucose) after you have not eaten for a while (fasting). You may have this done every 1-3 years.  Abdominal aortic aneurysm (AAA) screening. You may need this if you are a current or former smoker.  Osteoporosis. You may be screened starting at age 24 if you are at high risk. Talk with your health care provider about your test results, treatment options, and if necessary, the need for more tests. Vaccines  Your health care provider may recommend certain vaccines, such as:  Influenza vaccine. This is recommended every year.  Tetanus, diphtheria, and acellular pertussis (Tdap, Td) vaccine.  You may need a Td booster every 10 years.  Zoster vaccine. You may need this after age 29.  Pneumococcal 13-valent conjugate (PCV13) vaccine. One dose is recommended after age  37.  Pneumococcal polysaccharide (PPSV23) vaccine. One dose is recommended after age 102. Talk to your health care provider about which screenings and vaccines you need and how often you need them. This information is not intended to replace advice given to you by your health care provider. Make sure you discuss any questions you have with your health care provider. Document Released: 09/29/2015 Document Revised: 05/22/2016 Document Reviewed: 07/04/2015 Elsevier Interactive Patient Education  2017 [place] Prevention in the Home Falls can cause injuries. They can happen to people of all ages. There are many things you can do to make your home safe and to help prevent falls. What can I do on the outside of my home?  Regularly fix the edges of walkways and driveways and fix any cracks.  Remove anything that might make you trip as you walk through a door, such as a raised step or threshold.  Trim any bushes or trees on the path to your home.  Use bright outdoor lighting.  Clear any walking paths of anything that might make someone trip, such as rocks or tools.  Regularly check to see if handrails are loose or broken. Make sure that both sides of any steps have handrails.  Any raised decks and porches should have guardrails on the edges.  Have any leaves, snow, or ice cleared regularly.  Use sand or salt on walking paths during winter.  Clean up any spills in your garage right away. This includes oil or grease spills. What can I do in the bathroom?  Use night lights.  Install grab bars by the toilet and in the tub and shower. Do not use towel bars as grab bars.  Use non-skid mats or decals in the tub or shower.  If you need to sit down in the shower, use a plastic, non-slip stool.  Keep the floor dry. Clean up any water that spills on the floor as soon as it happens.  Remove soap buildup in the tub or shower regularly.  Attach bath mats securely with double-sided  non-slip rug tape.  Do not have throw rugs and other things on the floor that can make you trip. What can I do in the bedroom?  Use night lights.  Make sure that you have a light by your bed that is easy to reach.  Do not use any sheets or blankets that are too big for your bed. They should not hang down onto the floor.  Have a firm chair that has side arms. You can use this for support while you get dressed.  Do not have throw rugs and other things on the floor that can make you trip. What can I do in the kitchen?  Clean up any spills right away.  Avoid walking on wet floors.  Keep items that you use a lot in easy-to-reach places.  If you need to reach something above you, use a strong step stool that has a grab bar.  Keep electrical cords out of the way.  Do not use floor polish or wax that makes floors slippery. If you must use wax, use non-skid floor wax.  Do not have throw rugs and other things on the floor that can make you trip. What can I do with my stairs?  Do not leave any  items on the stairs.  Make sure that there are handrails on both sides of the stairs and use them. Fix handrails that are broken or loose. Make sure that handrails are as long as the stairways.  Check any carpeting to make sure that it is firmly attached to the stairs. Fix any carpet that is loose or worn.  Avoid having throw rugs at the top or bottom of the stairs. If you do have throw rugs, attach them to the floor with carpet tape.  Make sure that you have a light switch at the top of the stairs and the bottom of the stairs. If you do not have them, ask someone to add them for you. What else can I do to help prevent falls?  Wear shoes that:  Do not have high heels.  Have rubber bottoms.  Are comfortable and fit you well.  Are closed at the toe. Do not wear sandals.  If you use a stepladder:  Make sure that it is fully opened. Do not climb a closed stepladder.  Make sure that both  sides of the stepladder are locked into place.  Ask someone to hold it for you, if possible.  Clearly mark and make sure that you can see:  Any grab bars or handrails.  First and last steps.  Where the edge of each step is.  Use tools that help you move around (mobility aids) if they are needed. These include:  Canes.  Walkers.  Scooters.  Crutches.  Turn on the lights when you go into a dark area. Replace any light bulbs as soon as they burn out.  Set up your furniture so you have a clear path. Avoid moving your furniture around.  If any of your floors are uneven, fix them.  If there are any pets around you, be aware of where they are.  Review your medicines with your doctor. Some medicines can make you feel dizzy. This can increase your chance of falling. Ask your doctor what other things that you can do to help prevent falls. This information is not intended to replace advice given to you by your health care provider. Make sure you discuss any questions you have with your health care provider. Document Released: 06/29/2009 Document Revised: 02/08/2016 Document Reviewed: 10/07/2014 Elsevier Interactive Patient Education  2017 [employer].

## 2020-08-26 ENCOUNTER — Other Ambulatory Visit: Payer: Self-pay | Admitting: Family Medicine

## 2020-08-26 DIAGNOSIS — I10 Essential (primary) hypertension: Secondary | ICD-10-CM

## 2020-08-30 ENCOUNTER — Other Ambulatory Visit: Payer: Self-pay | Admitting: Family Medicine

## 2020-08-30 ENCOUNTER — Other Ambulatory Visit: Payer: Self-pay

## 2020-08-31 ENCOUNTER — Ambulatory Visit (INDEPENDENT_AMBULATORY_CARE_PROVIDER_SITE_OTHER): Payer: PPO | Admitting: Family Medicine

## 2020-08-31 ENCOUNTER — Encounter: Payer: Self-pay | Admitting: Family Medicine

## 2020-08-31 VITALS — BP 118/63 | HR 73 | Temp 98.4°F | Ht 69.0 in | Wt 177.0 lb

## 2020-08-31 DIAGNOSIS — E785 Hyperlipidemia, unspecified: Secondary | ICD-10-CM | POA: Diagnosis not present

## 2020-08-31 DIAGNOSIS — M159 Polyosteoarthritis, unspecified: Secondary | ICD-10-CM

## 2020-08-31 DIAGNOSIS — I10 Essential (primary) hypertension: Secondary | ICD-10-CM

## 2020-08-31 DIAGNOSIS — R7989 Other specified abnormal findings of blood chemistry: Secondary | ICD-10-CM | POA: Diagnosis not present

## 2020-08-31 DIAGNOSIS — N400 Enlarged prostate without lower urinary tract symptoms: Secondary | ICD-10-CM

## 2020-08-31 DIAGNOSIS — G629 Polyneuropathy, unspecified: Secondary | ICD-10-CM

## 2020-08-31 DIAGNOSIS — M8949 Other hypertrophic osteoarthropathy, multiple sites: Secondary | ICD-10-CM

## 2020-08-31 MED ORDER — CELECOXIB 200 MG PO CAPS: 200.0000 mg | ORAL_CAPSULE | Freq: Every day | ORAL | 1 refills | Status: DC

## 2020-08-31 MED ORDER — POTASSIUM CITRATE ER 10 MEQ (1080 MG) PO TBCR: EXTENDED_RELEASE_TABLET | ORAL | 5 refills | Status: DC

## 2020-08-31 MED ORDER — SIMVASTATIN 40 MG PO TABS: 40.0000 mg | ORAL_TABLET | Freq: Every evening | ORAL | 1 refills | Status: DC

## 2020-08-31 MED ORDER — GABAPENTIN 600 MG PO TABS: 600.0000 mg | ORAL_TABLET | Freq: Two times a day (BID) | ORAL | 1 refills | Status: DC

## 2020-08-31 MED ORDER — ZOSTER VAC RECOMB ADJUVANTED 50 MCG/0.5ML IM SUSR: 0.5000 mL | Freq: Once | INTRAMUSCULAR | 1 refills | Status: AC

## 2020-08-31 NOTE — Progress Notes (Signed)
SUBJECTIVE  Patient Care Team    Relationship Specialty Notifications Start End  Ma Hillock, DO PCP - General Family Medicine  01/19/19   [name], MD Consulting Physician Dermatology  01/19/20   Revankar, Reita Cliche, MD Consulting Physician Cardiology  01/19/20   Manson Passey, Emerge  Specialist  01/19/20   Odette Fraction  Optometry  01/19/20     Chief Complaint  Patient presents with  . Follow-up    [facility]; pt is fasting     HPI: [name] [name] is a 84 y.o. male present for chronic medical condition follow-up with acute concerns. Hypertension/hyperlipidemia: Pt reports compliance with losartan 50, Zocor 40 mg nightly. Patient denies chest pain, shortness of breath, dizziness or lower extremity edema.  Pt takes a daily baby ASA. Pt is  prescribed statin. Labs up-to-date.  Diet: Monitors his diet Exercise: Routine exercise RF: Hypertension, hyperlipidemia, family history of heart disease and stroke  BPH/arthritis/neuropathy: Patient has a history of arthritis status post shoulder replacement and knee arthroplasty.  He also has a history of BPH/s/p TURP.  He states he takes Celebrex 200 mg daily to help with his arthritis and it actually works better for his BPH than any of the other medications tried.    He did try the Flomax and it was not helpful.  He sustained a lower extremity fracture injury in which she has chronic neuropathy.  Celebrex 200 mg daily and gabapentin 200 mg twice daily is helpful for his pain.  ROS: See pertinent positives and negatives per HPI.  Patient Active Problem List   Diagnosis Date Noted  . Neuropathy of left foot   . Carotid atherosclerosis 01/21/2020  . Degeneration of lumbar intervertebral disc 12/09/2019  . Abnormal ankle brachial index (ABI) 07/05/2019  . LAE (left atrial enlargement) 05/13/2019  . Aortic valve calcification 05/13/2019  . Aortic valve regurgitation 05/13/2019  . Aortic valve stenosis 05/13/2019  . TIA (transient ischemic  attack) 05/13/2019  . Elevated TSH 05/13/2019  . Neuropathy- chronic s/p trauma 01/20/2019  . Sensorineural hearing loss (SNHL), bilateral 06/14/2016  . 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 Essential hypertension 06/02/2008  . Palpitations 02/04/2008  . Dyslipidemia 04/23/2007  . Allergic rhinitis 04/23/2007  . BPH (benign prostatic hyperplasia) 04/23/2007    Class: Present on Admission  . Osteoarthritis 04/23/2007    Social History   Tobacco Use  . Smoking status: Never Smoker  . Smokeless tobacco: Never Used  Substance Use Topics  . Alcohol use: No    Current Outpatient Medications:  .  aspirin 81 MG chewable tablet, 81 mg daily., Disp: , Rfl:  .  Coenzyme Q10 (COQ10) 200 MG CAPS, Take 200 mg by mouth at bedtime., Disp: , Rfl:  .  fexofenadine (ALLEGRA) 180 MG tablet, Allegra Allergy, Disp: , Rfl:  .  fluticasone (FLONASE) 50 MCG/ACT nasal spray, PLACE 1 SPRAY INTO BOTH NOSTRILS DAILY., Disp: 48 mL, Rfl: 1 .  Krill Oil 1000 MG CAPS, Take 2,000 mg by mouth daily with breakfast., Disp: , Rfl:  .  losartan (COZAAR) 50 MG tablet, Take 1 tablet (50 mg total) by mouth daily., Disp: 90 tablet, Rfl: 3 .  Multiple Vitamin (MULTIVITAMIN) tablet, Take 1 tablet by mouth daily., Disp: , Rfl:  .  Psyllium (METAMUCIL FIBER PO), Take 2 capsules by mouth daily with breakfast., Disp: , Rfl:  .  vitamin C (ASCORBIC ACID) 500 MG tablet, Take 1,500 mg by mouth at bedtime., Disp: ,  Rfl:  .  celecoxib (CELEBREX) 200 MG capsule, Take 1 capsule (200 mg total) by mouth daily., Disp: 90 capsule, Rfl: 1 .  gabapentin (NEURONTIN) 600 MG tablet, Take 1 tablet (600 mg total) by mouth 2 (two) times daily., Disp: 180 tablet, Rfl: 1 .  potassium citrate (UROCIT-K) 10 MEQ (1080 MG) SR tablet, TAKE 2 TABLETS BY MOUTH  TWICE DAILY WITH A MEAL, Disp: 120 tablet, Rfl: 5 .  simvastatin (ZOCOR) 40 MG tablet, Take 1 tablet (40 mg total) by mouth at bedtime., Disp: 90 tablet,  Rfl: 1  No Known Allergies  OBJECTIVE: BP 118/63   Pulse 73   Temp 98.4 F (36.9 C) (Oral)   Ht 5\' 9"  (1.753 m)   Wt 177 lb (80.3 kg)   SpO2 99%   BMI 26.14 kg/m  Gen: Afebrile. No acute distress.  Nontoxic in presentation.  Very pleasant male HENT: AT. [place].  Moist mucous membranes.  No cough.  No hoarseness. Eyes:Pupils Equal Round Reactive to light, Extraocular movements intact,  Conjunctiva without redness, discharge or icterus. Neck/lymp/endocrine: Supple, no lymphadenopathy, no thyromegaly CV: RRR , no edema, +2/4 P posterior tibialis pulses Chest: CTAB, no wheeze or crackles Abd: Soft. NTND. BS present.  Skin: No rashes, purpura or petechiae.  Neuro:  Normal gait. PERLA. EOMi. Alert. Oriented x3 Psych: Normal affect, dress and demeanor. Normal speech. Normal thought content and judgment.    ASSESSMENT AND PLAN: [name] [name] is a 84 y.o. male present for TOC.  Benign prostatic hyperplasia without lower urinary tract symptoms/nephrolithiasis/elevated PSA -Stable -Continue celebrex> [facility] - flomax tried and reported not helpful.  -Continue potassium citrate> [facility] Has been referred to urology  Dyslipidemia/Essential hypertension/Overweight (BMI 25.0-29.9) -Stable -Continue losartan-prescribed by cardiology -Continue Zocor 40 mg daily> [facility] - continue baby aspirin - low sodium diet and routine exercise.  Follow-up 6 months, sooner if blood pressure above goal.  Neuropathy- chronic s/p trauma -Stable status post trauma to lower extremity. -Continue gabapentin 600 mg twice daily> [facility]  Primary osteoarthritis involving multiple joints -Stable -Continue Celebrex  Abnormal ABI: Discussed signs and symptoms for him to monitor for decreased circulation.  At this time he would like to wait on any further testing.   Follow-up in 5.5 months provider appointment on chronic medical conditions   No orders of the defined types were placed in this encounter.  Meds  ordered this encounter  Medications  . Zoster Vaccine Adjuvanted [facility]) injection    Sig: Inject 0.5 mLs into the muscle once for 1 dose. Rpt dose 3 mos.    Dispense:  0.5 mL    Refill:  1  . celecoxib (CELEBREX) 200 MG capsule    Sig: Take 1 capsule (200 mg total) by mouth daily.    Dispense:  90 capsule    Refill:  1  . gabapentin (NEURONTIN) 600 MG tablet    Sig: Take 1 tablet (600 mg total) by mouth 2 (two) times daily.    Dispense:  180 tablet    Refill:  1  . potassium citrate (UROCIT-K) 10 MEQ (1080 MG) SR tablet    Sig: TAKE 2 TABLETS BY MOUTH  TWICE DAILY WITH A MEAL    Dispense:  120 tablet    Refill:  5  . simvastatin (ZOCOR) 40 MG tablet    Sig: Take 1 tablet (40 mg total) by mouth at bedtime.    Dispense:  90 tablet    Refill:  1   Referral Orders  No referral(s)  requested today      Howard Pouch, DO 09/01/2020

## 2020-08-31 NOTE — Patient Instructions (Addendum)
    It was great  To see you today!I have refilled your medications we provide.  Follow up in 5.5 months.   It was great  to see you today!

## 2020-09-01 ENCOUNTER — Encounter: Payer: Self-pay | Admitting: Family Medicine

## 2020-09-19 DIAGNOSIS — R972 Elevated prostate specific antigen [PSA]: Secondary | ICD-10-CM | POA: Diagnosis not present

## 2020-09-19 DIAGNOSIS — N138 Other obstructive and reflux uropathy: Secondary | ICD-10-CM | POA: Diagnosis not present

## 2020-09-19 DIAGNOSIS — N200 Calculus of kidney: Secondary | ICD-10-CM | POA: Diagnosis not present

## 2020-09-19 DIAGNOSIS — N401 Enlarged prostate with lower urinary tract symptoms: Secondary | ICD-10-CM | POA: Diagnosis not present

## 2020-09-19 DIAGNOSIS — G5792 Unspecified mononeuropathy of left lower limb: Secondary | ICD-10-CM | POA: Insufficient documentation

## 2020-09-25 ENCOUNTER — Encounter: Payer: Self-pay | Admitting: Family Medicine

## 2020-09-25 DIAGNOSIS — R7989 Other specified abnormal findings of blood chemistry: Secondary | ICD-10-CM

## 2020-09-25 MED ORDER — LEVOTHYROXINE SODIUM 25 MCG PO TABS: ORAL_TABLET | ORAL | 1 refills | Status: DC

## 2020-09-25 NOTE — Telephone Encounter (Signed)
Please inform patient I have called in levothyroxine half a tab daily in the morning.  This should be taken on an empty stomach.  Avoid eating for at least 30 minutes after taking medication.  Will need to recheck his thyroid levels on medication in 8 weeks by lab appointment only.  Please schedule him for a lab appointment.  Order has been placed

## 2020-10-30 ENCOUNTER — Encounter: Payer: Self-pay | Admitting: Family Medicine

## 2020-11-01 ENCOUNTER — Other Ambulatory Visit: Payer: Self-pay

## 2020-11-01 ENCOUNTER — Other Ambulatory Visit: Payer: Self-pay | Admitting: Family Medicine

## 2020-11-01 ENCOUNTER — Ambulatory Visit (INDEPENDENT_AMBULATORY_CARE_PROVIDER_SITE_OTHER): Payer: PPO

## 2020-11-01 DIAGNOSIS — R7989 Other specified abnormal findings of blood chemistry: Secondary | ICD-10-CM | POA: Diagnosis not present

## 2020-11-01 LAB — TSH: TSH: 4.48 u[IU]/mL (ref 0.35–4.50)

## 2020-11-01 LAB — T4, FREE: Free T4: 0.88 ng/dL (ref 0.60–1.60)

## 2020-11-01 MED ORDER — LEVOTHYROXINE SODIUM 25 MCG PO TABS: 25.0000 ug | ORAL_TABLET | Freq: Every day | ORAL | 4 refills | Status: DC

## 2020-11-06 DIAGNOSIS — N138 Other obstructive and reflux uropathy: Secondary | ICD-10-CM | POA: Diagnosis not present

## 2020-11-06 DIAGNOSIS — N200 Calculus of kidney: Secondary | ICD-10-CM | POA: Diagnosis not present

## 2020-11-06 DIAGNOSIS — R972 Elevated prostate specific antigen [PSA]: Secondary | ICD-10-CM | POA: Diagnosis not present

## 2020-11-06 DIAGNOSIS — N401 Enlarged prostate with lower urinary tract symptoms: Secondary | ICD-10-CM | POA: Diagnosis not present

## 2021-02-13 ENCOUNTER — Other Ambulatory Visit: Payer: Self-pay

## 2021-02-13 ENCOUNTER — Ambulatory Visit (INDEPENDENT_AMBULATORY_CARE_PROVIDER_SITE_OTHER): Payer: PPO | Admitting: Cardiology

## 2021-02-13 VITALS — BP 124/56 | HR 64 | Ht 69.0 in | Wt 173.1 lb

## 2021-02-13 DIAGNOSIS — E785 Hyperlipidemia, unspecified: Secondary | ICD-10-CM

## 2021-02-13 DIAGNOSIS — R112 Nausea with vomiting, unspecified: Secondary | ICD-10-CM | POA: Insufficient documentation

## 2021-02-13 DIAGNOSIS — Z9889 Other specified postprocedural states: Secondary | ICD-10-CM | POA: Insufficient documentation

## 2021-02-13 DIAGNOSIS — E782 Mixed hyperlipidemia: Secondary | ICD-10-CM

## 2021-02-13 DIAGNOSIS — I350 Nonrheumatic aortic (valve) stenosis: Secondary | ICD-10-CM

## 2021-02-13 DIAGNOSIS — I10 Essential (primary) hypertension: Secondary | ICD-10-CM | POA: Diagnosis not present

## 2021-02-13 DIAGNOSIS — R519 Headache, unspecified: Secondary | ICD-10-CM | POA: Insufficient documentation

## 2021-02-13 DIAGNOSIS — J189 Pneumonia, unspecified organism: Secondary | ICD-10-CM | POA: Insufficient documentation

## 2021-02-13 DIAGNOSIS — R7989 Other specified abnormal findings of blood chemistry: Secondary | ICD-10-CM

## 2021-02-13 DIAGNOSIS — I351 Nonrheumatic aortic (valve) insufficiency: Secondary | ICD-10-CM

## 2021-02-13 DIAGNOSIS — Z8669 Personal history of other diseases of the nervous system and sense organs: Secondary | ICD-10-CM | POA: Insufficient documentation

## 2021-02-13 NOTE — Patient Instructions (Signed)
Medication Instructions:  No medication changes. *If you need a refill on your cardiac medications before your next appointment, please call your pharmacy*   Lab Work: Your physician recommends that you return for lab work in: the next few days. You need to have labs done when you are fasting.  You can come Monday through Friday 8:30 am to 12:00 pm and 1:15 to 4:30. You do not need to make an appointment as the order has already been placed. The labs you are going to have done are BMET, CBC, TSH, LFT and Lipids.  If you have labs (blood work) drawn today and your tests are completely normal, you will receive your results only by: Marland Kitchen MyChart Message (if you have MyChart) OR . A paper copy in the mail If you have any lab test that is abnormal or we need to change your treatment, we will call you to review the results.   Testing/Procedures: Your physician has requested that you have an echocardiogram. Echocardiography is a painless test that uses sound waves to create images of your heart. It provides your doctor with information about the size and shape of your heart and how well your heart's chambers and valves are working. This procedure takes approximately one hour. There are no restrictions for this procedure.    Follow-Up: At [facility], you and your health needs are our priority.  As part of our continuing mission to provide you with exceptional heart care, we have created designated Provider Care Teams.  These Care Teams include your primary Cardiologist (physician) and Advanced Practice Providers (APPs -  Physician Assistants and Nurse Practitioners) who all work together to provide you with the care you need, when you need it.  We recommend signing up for the patient portal called "MyChart".  Sign up information is provided on this After Visit Summary.  MyChart is used to connect with patients for Virtual Visits (Telemedicine).  Patients are able to view lab/test results, encounter  notes, upcoming appointments, etc.  Non-urgent messages can be sent to your provider as well.   To learn more about what you can do with MyChart, go to [email or web address].    Your next appointment:   6 month(s)  The format for your next appointment:   In Person  Provider:   Jyl Heinz, MD   Other Instructions Echocardiogram An echocardiogram is a test that uses sound waves (ultrasound) to produce images of the heart. Images from an echocardiogram can provide important information about:  Heart size and shape.  The size and thickness and movement of your heart's walls.  Heart muscle function and strength.  Heart valve function or if you have stenosis. Stenosis is when the heart valves are too narrow.  If blood is flowing backward through the heart valves (regurgitation).  A tumor or infectious growth around the heart valves.  Areas of heart muscle that are not working well because of poor blood flow or injury from a heart attack.  Aneurysm detection. An aneurysm is a weak or damaged part of an artery wall. The wall bulges out from the normal force of blood pumping through the body. Tell a health care provider about:  Any allergies you have.  All medicines you are taking, including vitamins, herbs, eye drops, creams, and over-the-counter medicines.  Any blood disorders you have.  Any surgeries you have had.  Any medical conditions you have.  Whether you are pregnant or may be pregnant. What are the risks? Generally, this is a safe  test. However, problems may occur, including an allergic reaction to dye (contrast) that may be used during the test. What happens before the test? No specific preparation is needed. You may eat and drink normally. What happens during the test?  You will take off your clothes from the waist up and put on a hospital gown.  Electrodes or electrocardiogram (ECG)patches may be placed on your chest. The electrodes or patches are then  connected to a device that monitors your heart rate and rhythm.  You will lie down on a table for an ultrasound exam. A gel will be applied to your chest to help sound waves pass through your skin.  A handheld device, called a transducer, will be pressed against your chest and moved over your heart. The transducer produces sound waves that travel to your heart and bounce back (or "echo" back) to the transducer. These sound waves will be captured in real-time and changed into images of your heart that can be viewed on a video monitor. The images will be recorded on a computer and reviewed by your health care provider.  You may be asked to change positions or hold your breath for a short time. This makes it easier to get different views or better views of your heart.  In some cases, you may receive contrast through an IV in one of your veins. This can improve the quality of the pictures from your heart. The procedure may vary among health care providers and hospitals.   What can I expect after the test? You may return to your normal, everyday life, including diet, activities, and medicines, unless your health care provider tells you not to do that. Follow these instructions at home:  It is up to you to get the results of your test. Ask your health care provider, or the department that is doing the test, when your results will be ready.  Keep all follow-up visits. This is important. Summary  An echocardiogram is a test that uses sound waves (ultrasound) to produce images of the heart.  Images from an echocardiogram can provide important information about the size and shape of your heart, heart muscle function, heart valve function, and other possible heart problems.  You do not need to do anything to prepare before this test. You may eat and drink normally.  After the echocardiogram is completed, you may return to your normal, everyday life, unless your health care provider tells you not to do  that. This information is not intended to replace advice given to you by your health care provider. Make sure you discuss any questions you have with your health care provider. Document Revised: 04/25/2020 Document Reviewed: 04/25/2020 Elsevier Patient Education  2021 [employer].

## 2021-02-13 NOTE — Progress Notes (Signed)
Cardiology Office Note:    Date:  02/13/2021   ID:  [name] [name], DOB Jun 05, 1936, MRN [id number]  PCP:  Ma Hillock, DO  Cardiologist:  Jenean Lindau, MD   Referring MD: Ma Hillock, DO    ASSESSMENT:    1. Aortic valve insufficiency, etiology of cardiac valve disease unspecified   2. Aortic valve stenosis, etiology of cardiac valve disease unspecified   3. Essential hypertension   4. Elevated TSH   5. Dyslipidemia   6. Mixed hyperlipidemia    PLAN:    In order of problems listed above:  1. Primary prevention stressed with the patient.  Importance of compliance with diet medication stressed any vocalized understanding.  He has excellent effort tolerance and encouraged him to continue walking. 2. Mild aortic stenosis and regurgitation: I discussed findings with the patient at length.  Medical management.  He is asymptomatic.  We will do echocardiogram to follow this. 3. Mixed dyslipidemia: On statin therapy.  Managed by primary care.  He is doing his best to take better care of himself will be back in the morning for complete blood work including fasting lipids 4. Essential hypertension: Blood pressure stable and diet was emphasized.  Lifestyle modification urged. 5. Patient will be seen in follow-up appointment in 6 months or earlier if the patient has any concerns    Medication Adjustments/Labs and Tests Ordered: Current medicines are reviewed at length with the patient today.  Concerns regarding medicines are outlined above.  Orders Placed This Encounter  Procedures  . Basic metabolic panel  . CBC with Differential/Platelet  . Hepatic function panel  . Lipid panel  . TSH  . ECHOCARDIOGRAM COMPLETE   No orders of the defined types were placed in this encounter.    No chief complaint on file.    History of Present Illness:    [name] [name] is a 85 y.o. male.  Patient has past medical history of mild aortic stenosis and regurgitation and mixed  dyslipidemia.  He denies any problems at this time and takes care of activities of daily living.  No chest pain orthopnea or PND.  At the time of my evaluation, the patient is alert awake oriented and in no distress.  He walks half an hour on a regular basis without any symptoms.  Past Medical History:  Diagnosis Date  . Abnormal ankle brachial index (ABI) 07/05/2019   ABI of the toe, at home health visit was abnormal for severe PAD-reading obtained left large toe which has callus formation.  . Abnormal echocardiogram 05/13/2019  . Allergic rhinitis 04/23/2007   Qualifier: Diagnosis of  By: Paulina Fusi RN, Daine Gravel   . Aortic valve calcification 05/13/2019  . Aortic valve regurgitation 05/13/2019  . Aortic valve stenosis 05/13/2019  . BPH (benign prostatic hyperplasia) 04/23/2007   Qualifier: Diagnosis of  By: Paulina Fusi RN, Daine Gravel   . Carotid atherosclerosis 01/21/2020  . Cellulitis june 2013   left side("left flank")  . CHEST PAIN 02/04/2008  . Chronic cough 07/05/2019  . Chronic pain 04/29/2019  . Degeneration of lumbar intervertebral disc 12/09/2019  . Dyslipidemia 04/23/2007   Qualifier: Diagnosis of  By: Paulina Fusi RN, Daine Gravel   . Elevated TSH 05/13/2019  . Essential hypertension 06/02/2008   Qualifier: Diagnosis of  By: Burnice Logan  MD, Doretha Sou   . H/O blurred vision    both eyes, occasional  . Headache   . HYPERLIPIDEMIA 04/23/2007  . Hyperlipidemia 04/23/2007  . HYPERTENSION 06/02/2008  .  Hypertension 06/02/2008  . Impaired glucose tolerance 04/01/2016  . LAE (left atrial enlargement) 05/13/2019  . NEPHROLITHIASIS, HX OF 04/23/2007   lithotripsy- fragment remains, no symptoms   . 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 Neuropathy of left foot   . Osteoarthritis 04/23/2007   Qualifier: Diagnosis of  By: Paulina Fusi RN, Daine Gravel   . Palpitations 02/04/2008   Qualifier: Diagnosis of  By: Burnice Logan  MD, Doretha Sou   . Pneumonia   . PONV (postoperative nausea and vomiting)   . RECTAL  BLEEDING 02/04/2008   no recent problems  . 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 Sensorineural hearing loss (SNHL), bilateral 06/14/2016  . TIA (transient ischemic attack) 05/13/2019  . 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 Tinnitus 02/28/2010   deaf left ear(no hearing)  . Visual field defect 04/29/2019    Past Surgical History:  Procedure Laterality Date  . APPENDECTOMY    . CATARACT EXTRACTION Bilateral 04/2020  . COLONOSCOPY    . HERNIA REPAIR    . left foot surgery for fx  Jul 30, 2011  . LITHOTRIPSY  1992   cystoscopy with stent placement also done(1 stone fragment remains)  . right shoulder replacement  Sep 28, 2009  . TOTAL KNEE ARTHROPLASTY Right 08/30/2013   Procedure: RIGHT TOTAL KNEE ARTHROPLASTY RIGHT ;  Surgeon: Gearlean Alf, MD;  Location: [facility];  Service: Orthopedics;  Laterality: Right;  . TOTAL SHOULDER ARTHROPLASTY Left 02/02/2015   Procedure: LEFT TOTAL SHOULDER ARTHROPLASTY;  Surgeon: Justice Britain, MD;  Location: [facility];  Service: Orthopedics;  Laterality: Left;  . TRANSURETHRAL RESECTION OF PROSTATE  03/27/2012   Procedure: TRANSURETHRAL RESECTION OF THE PROSTATE WITH GYRUS INSTRUMENTS;  Surgeon: Claybon Jabs, MD;  Location: [facility];  Service: Urology;;    Current Medications: Current Meds  Medication Sig  . aspirin 81 MG chewable tablet Chew 81 mg by mouth daily.  . celecoxib (CELEBREX) 200 MG capsule Take 1 capsule (200 mg total) by mouth daily.  . Coenzyme Q10 (COQ10) 200 MG CAPS Take 200 mg by mouth at bedtime.  . fexofenadine (ALLEGRA) 180 MG tablet Take 180 mg by mouth daily.  . fluticasone (FLONASE) 50 MCG/ACT nasal spray Place 1 spray into both nostrils daily.  Marland Kitchen gabapentin (NEURONTIN) 600 MG tablet Take 1 tablet (600 mg total) by mouth 2 (two) times daily.  Javier Docker Oil 1000 MG CAPS Take 2,000 mg by mouth daily with breakfast.  . levothyroxine (SYNTHROID) 25 MCG tablet Take 1 tablet (25 mcg  total) by mouth daily before breakfast. Avoid meal at least 30 minutes after taking medication.  Marland Kitchen losartan (COZAAR) 50 MG tablet Take 50 mg by mouth daily.  . Multiple Vitamin (MULTIVITAMIN) tablet Take 1 tablet by mouth daily.  . potassium citrate (UROCIT-K) 10 MEQ (1080 MG) SR tablet Take 20 mEq by mouth 2 (two) times daily with a meal.  . Psyllium (METAMUCIL FIBER PO) Take 2 capsules by mouth daily with breakfast.  . simvastatin (ZOCOR) 40 MG tablet Take 1 tablet (40 mg total) by mouth at bedtime.  . vitamin C (ASCORBIC ACID) 500 MG tablet Take 500 mg by mouth at bedtime.     Allergies:   Patient has no known allergies.   Social History   Socioeconomic History  . Marital status: Married    Spouse name: Not on file  . Number of children: Not on  file  . Years of education: Not on file  . Highest education level: Not on file  Occupational History  . Not on file  Tobacco Use  . Smoking status: Never Smoker  . Smokeless tobacco: Never Used  Vaping Use  . Vaping Use: Never used  Substance and Sexual Activity  . Alcohol use: No  . Drug use: No  . Sexual activity: Yes    Partners: Female  Other Topics Concern  . Not on file  Social History Narrative   Marital status/children/pets: Married   Education/employment: retired   Scientist, physiological Strain: [place]   . Difficulty of Paying Living Expenses: Not hard at all  Food Insecurity: No Food Insecurity  . Worried About Charity fundraiser in the Last Year: Never true  . Ran Out of Food in the Last Year: Never true  Transportation Needs: No Transportation Needs  . Lack of Transportation (Medical): No  . Lack of Transportation (Non-Medical): No  Physical Activity: Sufficiently Active  . Days of Exercise per Week: 5 days  . Minutes of Exercise per Session: 40 min  Stress: No Stress Concern Present  . Feeling of Stress : Not at all  Social Connections: Moderately Integrated  . Frequency of  Communication with Friends and Family: More than three times a week  . Frequency of Social Gatherings with Friends and Family: More than three times a week  . Attends Religious Services: More than 4 times per year  . Active Member of Clubs or Organizations: No  . Attends Archivist Meetings: Never  . Marital Status: Married     Family History: The patient's family history includes COPD in his sister; Diabetes in his brother and sister; Heart disease (age of onset: 69) in his mother; Stroke (age of onset: 48) in his father.  ROS:   Please see the history of present illness.    All other systems reviewed and are negative.  EKGs/Labs/Other Studies Reviewed:    The following studies were reviewed today: I discussed the echo report which was done in the past which revealed mild aortic stenosis and regurgitation   Recent Labs: 08/17/2020: ALT 16; BUN 33; Creatinine, Ser 0.94; Hemoglobin 13.6; Platelets 197; Potassium 4.8; Sodium 141 11/01/2020: TSH 4.48  Recent Lipid Panel    Component Value Date/Time   CHOL 159 08/17/2020 0843   TRIG 51 08/17/2020 0843   HDL 68 08/17/2020 0843   CHOLHDL 2.3 08/17/2020 0843   CHOLHDL 2.2 04/30/2019 0459   VLDL 7 04/30/2019 0459   LDLCALC 80 08/17/2020 0843   LDLDIRECT 109.8 06/05/2011 0959    Physical Exam:    VS:  BP (!) 124/56   Pulse 64   Ht 5\' 9"  (1.753 m)   Wt 173 lb 1.3 oz (78.5 kg)   SpO2 99%   BMI 25.56 kg/m     Wt Readings from Last 3 Encounters:  02/13/21 173 lb 1.3 oz (78.5 kg)  08/31/20 177 lb (80.3 kg)  08/23/20 177 lb (80.3 kg)     GEN: Patient is in no acute distress HEENT: Normal NECK: No JVD; No carotid bruits LYMPHATICS: No lymphadenopathy CARDIAC: Hear sounds regular, 2/6 systolic murmur at the apex. RESPIRATORY:  Clear to auscultation without rales, wheezing or rhonchi  ABDOMEN: Soft, non-tender, non-distended MUSCULOSKELETAL:  No edema; No deformity  SKIN: Warm and dry NEUROLOGIC:  Alert and  oriented x 3 PSYCHIATRIC:  Normal affect   Signed, Reita Cliche Yvett Rossel,  MD  02/13/2021 11:08 AM    [place]

## 2021-02-14 ENCOUNTER — Ambulatory Visit: Payer: PPO | Admitting: Cardiology

## 2021-02-14 DIAGNOSIS — I10 Essential (primary) hypertension: Secondary | ICD-10-CM | POA: Diagnosis not present

## 2021-02-14 DIAGNOSIS — R7989 Other specified abnormal findings of blood chemistry: Secondary | ICD-10-CM | POA: Diagnosis not present

## 2021-02-14 DIAGNOSIS — E785 Hyperlipidemia, unspecified: Secondary | ICD-10-CM | POA: Diagnosis not present

## 2021-02-15 LAB — HEPATIC FUNCTION PANEL
ALT: 16 IU/L (ref 0–44)
AST: 19 IU/L (ref 0–40)
Albumin: 4.8 g/dL — ABNORMAL HIGH (ref 3.6–4.6)
Alkaline Phosphatase: 57 IU/L (ref 44–121)
Bilirubin Total: 0.7 mg/dL (ref 0.0–1.2)
Bilirubin, Direct: 0.18 mg/dL (ref 0.00–0.40)
Total Protein: 6.6 g/dL (ref 6.0–8.5)

## 2021-02-15 LAB — CBC WITH DIFFERENTIAL/PLATELET
Basophils Absolute: 0.1 10*3/uL (ref 0.0–0.2)
Basos: 1 %
EOS (ABSOLUTE): 0.3 10*3/uL (ref 0.0–0.4)
Eos: 5 %
Hematocrit: 36.7 % — ABNORMAL LOW (ref 37.5–51.0)
Hemoglobin: 12.5 g/dL — ABNORMAL LOW (ref 13.0–17.7)
Immature Grans (Abs): 0 10*3/uL (ref 0.0–0.1)
Immature Granulocytes: 0 %
Lymphocytes Absolute: 1.3 10*3/uL (ref 0.7–3.1)
Lymphs: 21 %
MCH: 31.3 pg (ref 26.6–33.0)
MCHC: 34.1 g/dL (ref 31.5–35.7)
MCV: 92 fL (ref 79–97)
Monocytes Absolute: 0.5 10*3/uL (ref 0.1–0.9)
Monocytes: 8 %
Neutrophils Absolute: 3.9 10*3/uL (ref 1.4–7.0)
Neutrophils: 65 %
Platelets: 191 10*3/uL (ref 150–450)
RBC: 4 x10E6/uL — ABNORMAL LOW (ref 4.14–5.80)
RDW: 12.9 % (ref 11.6–15.4)
WBC: 6 10*3/uL (ref 3.4–10.8)

## 2021-02-15 LAB — LIPID PANEL
Chol/HDL Ratio: 2.2 ratio (ref 0.0–5.0)
Cholesterol, Total: 147 mg/dL (ref 100–199)
HDL: 67 mg/dL (ref >39)
LDL Chol Calc (NIH): 70 mg/dL (ref 0–99)
Triglycerides: 44 mg/dL (ref 0–149)
VLDL Cholesterol Cal: 10 mg/dL (ref 5–40)

## 2021-02-15 LAB — BASIC METABOLIC PANEL
BUN/Creatinine Ratio: 47 — ABNORMAL HIGH (ref 10–24)
BUN: 43 mg/dL — ABNORMAL HIGH (ref 8–27)
CO2: 22 mmol/L (ref 20–29)
Calcium: 9.3 mg/dL (ref 8.6–10.2)
Chloride: 102 mmol/L (ref 96–106)
Creatinine, Ser: 0.91 mg/dL (ref 0.76–1.27)
Glucose: 91 mg/dL (ref 65–99)
Potassium: 4.5 mmol/L (ref 3.5–5.2)
Sodium: 140 mmol/L (ref 134–144)
eGFR: 84 mL/min/{1.73_m2} (ref >59)

## 2021-02-15 LAB — TSH: TSH: 3.81 u[IU]/mL (ref 0.450–4.500)

## 2021-03-13 ENCOUNTER — Ambulatory Visit (HOSPITAL_BASED_OUTPATIENT_CLINIC_OR_DEPARTMENT_OTHER)
Admission: RE | Admit: 2021-03-13 | Discharge: 2021-03-13 | Disposition: A | Discharge status: Home / Self Care | Payer: PPO | Source: Ambulatory Visit | Attending: Cardiology | Admitting: Cardiology

## 2021-03-13 ENCOUNTER — Other Ambulatory Visit: Payer: Self-pay

## 2021-03-13 DIAGNOSIS — I350 Nonrheumatic aortic (valve) stenosis: Secondary | ICD-10-CM | POA: Insufficient documentation

## 2021-03-13 LAB — ECHOCARDIOGRAM COMPLETE
AR max vel: 2.16 cm2
AV Area VTI: 1.87 cm2
AV Area mean vel: 1.86 cm2
AV Mean grad: 14 mmHg
AV Peak grad: 21.9 mmHg
Ao pk vel: 2.34 m/s
Area-P 1/2: 2.33 cm2
Calc EF: 59.3 %
P 1/2 time: 377 msec
S' Lateral: 3.02 cm
Single Plane A2C EF: 53.1 %
Single Plane A4C EF: 60.7 %

## 2021-03-13 NOTE — Progress Notes (Signed)
*  PRELIMINARY RESULTS* Echocardiogram 2D Echocardiogram has been performed.  Luisa Hart RDCS 03/13/2021, 10:54 AM

## 2021-03-14 ENCOUNTER — Other Ambulatory Visit: Payer: Self-pay | Admitting: Family Medicine

## 2021-03-14 DIAGNOSIS — I10 Essential (primary) hypertension: Secondary | ICD-10-CM

## 2021-03-20 ENCOUNTER — Other Ambulatory Visit: Payer: Self-pay

## 2021-03-20 ENCOUNTER — Encounter: Payer: Self-pay | Admitting: Family Medicine

## 2021-03-20 ENCOUNTER — Ambulatory Visit (INDEPENDENT_AMBULATORY_CARE_PROVIDER_SITE_OTHER): Payer: PPO | Admitting: Family Medicine

## 2021-03-20 VITALS — BP 134/64 | HR 79 | Temp 98.5°F | Ht 69.0 in | Wt 174.0 lb

## 2021-03-20 DIAGNOSIS — M8949 Other hypertrophic osteoarthropathy, multiple sites: Secondary | ICD-10-CM | POA: Diagnosis not present

## 2021-03-20 DIAGNOSIS — N400 Enlarged prostate without lower urinary tract symptoms: Secondary | ICD-10-CM | POA: Diagnosis not present

## 2021-03-20 DIAGNOSIS — E785 Hyperlipidemia, unspecified: Secondary | ICD-10-CM | POA: Diagnosis not present

## 2021-03-20 DIAGNOSIS — M159 Polyosteoarthritis, unspecified: Secondary | ICD-10-CM

## 2021-03-20 DIAGNOSIS — G629 Polyneuropathy, unspecified: Secondary | ICD-10-CM | POA: Diagnosis not present

## 2021-03-20 DIAGNOSIS — E039 Hypothyroidism, unspecified: Secondary | ICD-10-CM | POA: Diagnosis not present

## 2021-03-20 DIAGNOSIS — I10 Essential (primary) hypertension: Secondary | ICD-10-CM

## 2021-03-20 DIAGNOSIS — M150 Primary generalized (osteo)arthritis: Secondary | ICD-10-CM

## 2021-03-20 MED ORDER — GABAPENTIN 600 MG PO TABS: 600.0000 mg | ORAL_TABLET | Freq: Two times a day (BID) | ORAL | 1 refills | Status: DC

## 2021-03-20 MED ORDER — CELECOXIB 200 MG PO CAPS: 200.0000 mg | ORAL_CAPSULE | Freq: Every day | ORAL | 1 refills | Status: DC

## 2021-03-20 MED ORDER — POTASSIUM CITRATE ER 10 MEQ (1080 MG) PO TBCR: 20.0000 meq | EXTENDED_RELEASE_TABLET | Freq: Two times a day (BID) | ORAL | 5 refills | Status: DC

## 2021-03-20 MED ORDER — SIMVASTATIN 40 MG PO TABS: 40.0000 mg | ORAL_TABLET | Freq: Every evening | ORAL | 1 refills | Status: DC

## 2021-03-20 NOTE — Progress Notes (Signed)
SUBJECTIVE  Patient Care Team    Relationship Specialty Notifications Start End  Ma Hillock, DO PCP - General Family Medicine  01/19/19   [name], MD Consulting Physician Dermatology  01/19/20   Revankar, Reita Cliche, MD Consulting Physician Cardiology  01/19/20   Manson Passey, Emerge  Specialist  01/19/20   Odette Fraction  Optometry  01/19/20     Chief Complaint  Patient presents with   Hypertension    [place]; pt is not fasting    HPI: [name] [name] is a 85 y.o. male present for chronic medical condition follow-up with acute concerns. Hypertension/hyperlipidemia: Pt reports compliance with losartan 50, Zocor 40 mg nightly. Patient denies chest pain, shortness of breath, dizziness or lower extremity edema.  Pt takes a daily baby ASA. Pt is  prescribed statin. Labs up-to-date.  Diet: Monitors his diet Exercise: Routine exercise RF: Hypertension, hyperlipidemia, family history of heart disease and stroke  BPH/arthritis/neuropathy: Patient has a history of arthritis status post shoulder replacement and knee arthroplasty.  He also has a history of BPH/s/p TURP.  He states he takes Celebrex 200 mg daily to help with his arthritis and it actually works better for  his BPH than any of the other medications tried.  He did try the Flomax and it was not helpful.  He sustained a lower extremity fracture injury in which she has chronic neuropathy.  Celebrex 200 mg daily and gabapentin 200 mg twice daily is very helpful for his pain.   ROS: See pertinent positives and negatives per HPI.  Patient Active Problem List   Diagnosis Date Noted   Neuropathy of left foot 09/19/2020   Carotid atherosclerosis 01/21/2020   Degeneration of lumbar intervertebral disc 12/09/2019   Abnormal ankle brachial index (ABI) 07/05/2019   Chronic cough 07/05/2019   LAE (left atrial enlargement) 05/13/2019   Aortic valve calcification 05/13/2019   Aortic valve regurgitation 05/13/2019   Aortic valve stenosis  05/13/2019   TIA (transient ischemic attack) 05/13/2019   Hypothyroidism 05/13/2019   Abnormal echocardiogram 05/13/2019   Chronic pain 04/29/2019   Visual field defect 04/29/2019   Neuropathy- chronic s/p trauma 01/20/2019   Sensorineural hearing loss (SNHL), bilateral 06/14/2016   Tinnitus 02/28/2010   Essential hypertension 06/02/2008   Palpitations 02/04/2008   Dyslipidemia 04/23/2007   BPH (benign prostatic hyperplasia) 04/23/2007    Class: Present on Admission   Osteoarthritis 04/23/2007    Social History   Tobacco Use   Smoking status: Never   Smokeless tobacco: Never  Substance Use Topics   Alcohol use: No    Current Outpatient Medications:    aspirin 81 MG chewable tablet, Chew 81 mg by mouth daily., Disp: , Rfl:    Coenzyme Q10 (COQ10) 200 MG CAPS, Take 200 mg by mouth at bedtime., Disp: , Rfl:    fexofenadine (ALLEGRA) 180 MG tablet, Take 180 mg by mouth daily., Disp: , Rfl:    fluticasone (FLONASE) 50 MCG/ACT nasal spray, Place 1 spray into both nostrils daily., Disp: , Rfl:    Krill Oil 1000 MG CAPS, Take 2,000 mg by mouth daily with breakfast., Disp: , Rfl:    levothyroxine (SYNTHROID) 25 MCG tablet, Take 1 tablet (25 mcg total) by mouth daily before breakfast. Avoid meal at least 30 minutes after taking medication., Disp: 90 tablet, Rfl: 4   losartan (COZAAR) 50 MG tablet, Take 50 mg by mouth daily., Disp: , Rfl:    Multiple Vitamin (MULTIVITAMIN) tablet, Take 1 tablet by mouth daily.,  Disp: , Rfl:    Psyllium (METAMUCIL FIBER PO), Take 2 capsules by mouth daily with breakfast., Disp: , Rfl:    vitamin C (ASCORBIC ACID) 500 MG tablet, Take 500 mg by mouth at bedtime., Disp: , Rfl:    celecoxib (CELEBREX) 200 MG capsule, Take 1 capsule (200 mg total) by mouth daily., Disp: 90 capsule, Rfl: 1   gabapentin (NEURONTIN) 600 MG tablet, Take 1 tablet (600 mg total) by mouth 2 (two) times daily., Disp: 180 tablet, Rfl: 1   potassium citrate (UROCIT-K) 10 MEQ (1080 MG) SR  tablet, Take 2 tablets (20 mEq total) by mouth 2 (two) times daily with a meal., Disp: 120 tablet, Rfl: 5   simvastatin (ZOCOR) 40 MG tablet, Take 1 tablet (40 mg total) by mouth at bedtime., Disp: 90 tablet, Rfl: 1  No Known Allergies  OBJECTIVE: BP 134/64   Pulse 79   Temp 98.5 F (36.9 C) (Oral)   Ht 5\' 9"  (1.753 m)   Wt 174 lb (78.9 kg)   SpO2 98%   BMI 25.70 kg/m  Gen: Afebrile. No acute distress.  HENT: AT. [place]. No cough, no hoarseness.  Eyes:Pupils Equal Round Reactive to light, Extraocular movements intact,  Conjunctiva without redness, discharge or icterus. Neck/lymp/endocrine: Supple,no lymphadenopathy, no thyromegaly CV: RRR 2/6 SM, no edema, +2/4 P posterior tibialis pulses Chest: CTAB, no wheeze or crackles Abd: Soft. NTND. BS present Neuro: Normal gait. PERLA. EOMi. Alert. Oriented x3 Psych: Normal affect, dress and demeanor. Normal speech. Normal thought content and judgment.   ASSESSMENT AND PLAN: SABA NEUMAN is a 85 y.o. male present for  Benign prostatic hyperplasia without lower urinary tract symptoms/nephrolithiasis/elevated PSA -stable - continue  celebrex> [facility] - flomax tried and reported not helpful.  - continue potassium citrate> [facility] Has been referred to urology  Dyslipidemia/Essential hypertension/Overweight (BMI 25.0-29.9) -stable -continue losartan-prescribed by cardiology -continue Zocor 40 mg daily> [facility] - continue baby aspirin - low sodium diet and routine exercise.  Follow-up 5.5 months, sooner if blood pressure above goal.  Neuropathy- chronic s/p trauma -stable status post trauma to lower extremity. -continue gabapentin 600 mg twice daily> [facility]  Primary osteoarthritis involving multiple joints -stable -continue Celebrex  Abnormal ABI: Discussed signs and symptoms for him to monitor for decreased circulation.  At this time he would like to wait on any further testing.   Follow-up in 5.5 months provider appointment on  chronic medical conditions   No orders of the defined types were placed in this encounter.  Meds ordered this encounter  Medications   DISCONTD: celecoxib (CELEBREX) 200 MG capsule    Sig: Take 1 capsule (200 mg total) by mouth daily.    Dispense:  90 capsule    Refill:  1   DISCONTD: gabapentin (NEURONTIN) 600 MG tablet    Sig: Take 1 tablet (600 mg total) by mouth 2 (two) times daily.    Dispense:  180 tablet    Refill:  1   simvastatin (ZOCOR) 40 MG tablet    Sig: Take 1 tablet (40 mg total) by mouth at bedtime.    Dispense:  90 tablet    Refill:  1   celecoxib (CELEBREX) 200 MG capsule    Sig: Take 1 capsule (200 mg total) by mouth daily.    Dispense:  90 capsule    Refill:  1   gabapentin (NEURONTIN) 600 MG tablet    Sig: Take 1 tablet (600 mg total) by mouth 2 (two) times daily.  Dispense:  180 tablet    Refill:  1   potassium citrate (UROCIT-K) 10 MEQ (1080 MG) SR tablet    Sig: Take 2 tablets (20 mEq total) by mouth 2 (two) times daily with a meal.    Dispense:  120 tablet    Refill:  5    Referral Orders  No referral(s) requested today      Howard Pouch, DO 03/20/2021

## 2021-03-20 NOTE — Patient Instructions (Signed)
Great to see you today.  I have refilled the medication(s) we provide.   If labs were collected, we will inform you of lab results once received either by echart message or telephone call.   - echart message- for normal results that have been seen by the patient already.   - telephone call: abnormal results or if patient has not viewed results in their echart.  

## 2021-03-21 DIAGNOSIS — L821 Other seborrheic keratosis: Secondary | ICD-10-CM | POA: Diagnosis not present

## 2021-03-21 DIAGNOSIS — L570 Actinic keratosis: Secondary | ICD-10-CM | POA: Diagnosis not present

## 2021-03-21 DIAGNOSIS — Z85828 Personal history of other malignant neoplasm of skin: Secondary | ICD-10-CM | POA: Diagnosis not present

## 2021-03-21 DIAGNOSIS — D225 Melanocytic nevi of trunk: Secondary | ICD-10-CM | POA: Diagnosis not present

## 2021-03-21 DIAGNOSIS — L859 Epidermal thickening, unspecified: Secondary | ICD-10-CM | POA: Diagnosis not present

## 2021-03-21 DIAGNOSIS — D485 Neoplasm of uncertain behavior of skin: Secondary | ICD-10-CM | POA: Diagnosis not present

## 2021-03-21 DIAGNOSIS — L814 Other melanin hyperpigmentation: Secondary | ICD-10-CM | POA: Diagnosis not present

## 2021-03-21 DIAGNOSIS — L218 Other seborrheic dermatitis: Secondary | ICD-10-CM | POA: Diagnosis not present

## 2021-03-21 DIAGNOSIS — D2261 Melanocytic nevi of right upper limb, including shoulder: Secondary | ICD-10-CM | POA: Diagnosis not present

## 2021-03-21 DIAGNOSIS — D2262 Melanocytic nevi of left upper limb, including shoulder: Secondary | ICD-10-CM | POA: Diagnosis not present

## 2021-03-27 ENCOUNTER — Ambulatory Visit: Payer: PPO | Admitting: Family Medicine

## 2021-05-31 DIAGNOSIS — L299 Pruritus, unspecified: Secondary | ICD-10-CM | POA: Insufficient documentation

## 2021-05-31 DIAGNOSIS — H903 Sensorineural hearing loss, bilateral: Secondary | ICD-10-CM | POA: Diagnosis not present

## 2021-05-31 HISTORY — DX: Pruritus, unspecified: L29.9

## 2021-06-05 ENCOUNTER — Other Ambulatory Visit: Payer: Self-pay | Admitting: Cardiology

## 2021-06-20 LAB — HM DIABETES FOOT EXAM: HM Diabetic Foot Exam: NORMAL

## 2021-07-20 DIAGNOSIS — L570 Actinic keratosis: Secondary | ICD-10-CM | POA: Diagnosis not present

## 2021-07-20 DIAGNOSIS — L820 Inflamed seborrheic keratosis: Secondary | ICD-10-CM | POA: Diagnosis not present

## 2021-07-20 DIAGNOSIS — Z85828 Personal history of other malignant neoplasm of skin: Secondary | ICD-10-CM | POA: Diagnosis not present

## 2021-08-07 ENCOUNTER — Other Ambulatory Visit: Payer: Self-pay

## 2021-08-07 ENCOUNTER — Encounter: Payer: Self-pay | Admitting: Cardiology

## 2021-08-07 ENCOUNTER — Ambulatory Visit: Payer: PPO | Admitting: Cardiology

## 2021-08-07 VITALS — BP 148/64 | HR 69 | Ht 69.0 in | Wt 178.1 lb

## 2021-08-07 DIAGNOSIS — I10 Essential (primary) hypertension: Secondary | ICD-10-CM | POA: Diagnosis not present

## 2021-08-07 DIAGNOSIS — E782 Mixed hyperlipidemia: Secondary | ICD-10-CM | POA: Diagnosis not present

## 2021-08-07 DIAGNOSIS — I340 Nonrheumatic mitral (valve) insufficiency: Secondary | ICD-10-CM

## 2021-08-07 DIAGNOSIS — I350 Nonrheumatic aortic (valve) stenosis: Secondary | ICD-10-CM

## 2021-08-07 HISTORY — DX: Nonrheumatic mitral (valve) insufficiency: I34.0

## 2021-08-07 HISTORY — DX: Nonrheumatic aortic (valve) stenosis: I35.0

## 2021-08-07 HISTORY — DX: Mixed hyperlipidemia: E78.2

## 2021-08-07 NOTE — Patient Instructions (Signed)
Medication Instructions:  Your physician recommends that you continue on your current medications as directed. Please refer to the Current Medication list given to you today.  *If you need a refill on your cardiac medications before your next appointment, please call your pharmacy*   Lab Work: Your physician recommends that you return for lab work in: TODAY BMP, CBC, TSH, LFT, LIPIDS If you have labs (blood work) drawn today and your tests are completely normal, you will receive your results only by: [place] (if you have MyChart) OR A paper copy in the mail If you have any lab test that is abnormal or we need to change your treatment, we will call you to review the results.   Testing/Procedures: None   Follow-Up: At [facility], you and your health needs are our priority.  As part of our continuing mission to provide you with exceptional heart care, we have created designated Provider Care Teams.  These Care Teams include your primary Cardiologist (physician) and Advanced Practice Providers (APPs -  Physician Assistants and Nurse Practitioners) who all work together to provide you with the care you need, when you need it.  We recommend signing up for the patient portal called "MyChart".  Sign up information is provided on this After Visit Summary.  MyChart is used to connect with patients for Virtual Visits (Telemedicine).  Patients are able to view lab/test results, encounter notes, upcoming appointments, etc.  Non-urgent messages can be sent to your provider as well.   To learn more about what you can do with MyChart, go to [email or web address].    Your next appointment:   3 month(s)  The format for your next appointment:   In Person  Provider:   Jyl Heinz, MD    Other Instructions

## 2021-08-07 NOTE — Progress Notes (Signed)
Cardiology Office Note:    Date:  08/07/2021   ID:  BRONSON BRESSMAN, DOB [birth year], MRN [id number]  PCP:  Ma Hillock, DO  Cardiologist:  Jenean Lindau, MD   Referring MD: Ma Hillock, DO    ASSESSMENT:    1. Essential hypertension   2. Mixed dyslipidemia   3. Mild aortic stenosis   4. Moderate mitral regurgitation    PLAN:    In order of problems listed above:  Primary prevention stressed with the patient.  Importance of compliance with diet medication stressed any vocalized understanding. Essential hypertension: Blood pressure stable and diet was emphasized.  Lifestyle modification urged.  Because of his feeling of fatigue I told him to keep a blood pressure log at home and bring it to me in the next few days. Mixed dyslipidemia: Lipids were reviewed from last time.  Be back in the next few days for complete blood work.  This will include lipids.  Diet was emphasized. Valvular heart disease: Mild aortic stenosis and moderate mitral regurgitation: I think the management is optimal.  It will help when he gets blood pressure readings from home to fine-tune his blood pressure and medications as necessary. Patient had multiple questions which were answered to satisfaction.Patient will be seen in follow-up appointment in 6 months or earlier if the patient has any concerns    Medication Adjustments/Labs and Tests Ordered: Current medicines are reviewed at length with the patient today.  Concerns regarding medicines are outlined above.  No orders of the defined types were placed in this encounter.  No orders of the defined types were placed in this encounter.    No chief complaint on file.    History of Present Illness:    [name] [name] is a 85 y.o. male.  Patient has past medical history of essential hypertension, mild aortic stenosis and moderate mitral regurgitation.  He denies any problems at this time and takes care of activities of daily living.  No  chest pain orthopnea or PND.  At the time of my evaluation, the patient is alert awake oriented and in no distress.  He mentions to me that overall he feels tired.  He has not had blood work for quite some time.  Past Medical History:  Diagnosis Date   Abnormal ankle brachial index (ABI) 07/05/2019   ABI of the toe, at home health visit was abnormal for severe PAD-reading obtained left large toe which has callus formation.   Abnormal echocardiogram 05/13/2019   Aortic valve calcification 05/13/2019   Aortic valve regurgitation 05/13/2019   Aortic valve stenosis 05/13/2019   Asymmetrical sensorineural hearing loss 06/14/2016   BPH (benign prostatic hyperplasia) 04/23/2007   Qualifier: Diagnosis of  By: Paulina Fusi RN, Daine Gravel    Carotid atherosclerosis 01/21/2020   Chronic cough 07/05/2019   Chronic pain 04/29/2019   Degeneration of lumbar intervertebral disc 12/09/2019   Dyslipidemia 04/23/2007   Qualifier: Diagnosis of  By: Teresa Coombs    Elevated TSH 05/13/2019   Essential hypertension 06/02/2008   Qualifier: Diagnosis of  By: Burnice Logan  MD, Doretha Sou    Hypothyroidism 05/13/2019   Itching of ear 05/31/2021   LAE (left atrial enlargement) 05/13/2019   Neuropathy    s/p trauma to left lower ext (horse accident)   Neuropathy of left foot    Osteoarthritis 04/23/2007   Qualifier: Diagnosis of  By: Paulina Fusi RN, Daine Gravel    Palpitations 02/04/2008   Qualifier: Diagnosis of  By:  Burnice Logan  MD, Doretha Sou    Sensorineural hearing loss (SNHL), bilateral 06/14/2016   TIA (transient ischemic attack) 05/13/2019   Tinnitus 02/28/2010   deaf left ear(no hearing)   Visual field defect 04/29/2019    Past Surgical History:  Procedure Laterality Date   APPENDECTOMY     CATARACT EXTRACTION Bilateral 04/2020   COLONOSCOPY     HERNIA REPAIR     left foot surgery for fx  Jul 30, 2011   LITHOTRIPSY  1992   cystoscopy with stent placement also done(1 stone fragment remains)   right shoulder  replacement  Sep 28, 2009   TOTAL KNEE ARTHROPLASTY Right 08/30/2013   Procedure: RIGHT TOTAL KNEE ARTHROPLASTY RIGHT ;  Surgeon: Gearlean Alf, MD;  Location: [facility];  Service: Orthopedics;  Laterality: Right;   TOTAL SHOULDER ARTHROPLASTY Left 02/02/2015   Procedure: LEFT TOTAL SHOULDER ARTHROPLASTY;  Surgeon: Justice Britain, MD;  Location: [facility];  Service: Orthopedics;  Laterality: Left;   TRANSURETHRAL RESECTION OF PROSTATE  03/27/2012   Procedure: TRANSURETHRAL RESECTION OF THE PROSTATE WITH GYRUS INSTRUMENTS;  Surgeon: Claybon Jabs, MD;  Location: [facility];  Service: Urology;;    Current Medications: Current Meds  Medication Sig   aspirin 81 MG chewable tablet Chew 81 mg by mouth daily.   celecoxib (CELEBREX) 200 MG capsule Take 1 capsule (200 mg total) by mouth daily.   Coenzyme Q10 (COQ10) 200 MG CAPS Take 200 mg by mouth at bedtime.   fexofenadine (ALLEGRA) 180 MG tablet Take 180 mg by mouth daily.   fluticasone (FLONASE) 50 MCG/ACT nasal spray Place 1 spray into both nostrils daily.   gabapentin (NEURONTIN) 600 MG tablet Take 1 tablet (600 mg total) by mouth 2 (two) times daily.   Krill Oil 1000 MG CAPS Take 2,000 mg by mouth daily with breakfast.   levothyroxine (SYNTHROID) 25 MCG tablet Take 1 tablet (25 mcg total) by mouth daily before breakfast. Avoid meal at least 30 minutes after taking medication.   losartan (COZAAR) 50 MG tablet TAKE 1 TABLET BY MOUTH EVERY DAY   Multiple Vitamin (MULTIVITAMIN) tablet Take 1 tablet by mouth daily.   potassium citrate (UROCIT-K) 10 MEQ (1080 MG) SR tablet Take 2 tablets (20 mEq total) by mouth 2 (two) times daily with a meal.   Psyllium (METAMUCIL FIBER PO) Take 2 capsules by mouth daily with breakfast.   simvastatin (ZOCOR) 40 MG tablet Take 1 tablet (40 mg total) by mouth at bedtime.   vitamin C (ASCORBIC ACID) 500 MG tablet Take 500 mg by mouth at bedtime.     Allergies:   Patient has no known allergies.   Social History    Socioeconomic History   Marital status: Married    Spouse name: Not on file   Number of children: Not on file   Years of education: Not on file   Highest education level: Not on file  Occupational History   Not on file  Tobacco Use   Smoking status: Never   Smokeless tobacco: Never  Vaping Use   Vaping Use: Never used  Substance and Sexual Activity   Alcohol use: No   Drug use: No   Sexual activity: Yes    Partners: Female  Other Topics Concern   Not on file  Social History Narrative   Marital status/children/pets: Married   Education/employment: retired   Investment banker, operational of Radio broadcast assistant Strain: Low Risk    Difficulty of Paying Living Expenses: Not hard at  all  Food Insecurity: No Food Insecurity   Worried About Charity fundraiser in the Last Year: Never true   Ran Out of Food in the Last Year: Never true  Transportation Needs: No Transportation Needs   Lack of Transportation (Medical): No   Lack of Transportation (Non-Medical): No  Physical Activity: Sufficiently Active   Days of Exercise per Week: 5 days   Minutes of Exercise per Session: 40 min  Stress: No Stress Concern Present   Feeling of Stress : Not at all  Social Connections: Moderately Integrated   Frequency of Communication with Friends and Family: More than three times a week   Frequency of Social Gatherings with Friends and Family: More than three times a week   Attends Religious Services: More than 4 times per year   Active Member of [employer] or Organizations: No   Attends Archivist Meetings: Never   Marital Status: Married     Family History: The patient's family history includes COPD in his sister; Diabetes in his brother and sister; Heart disease (age of onset: 38) in his mother; Stroke (age of onset: 8) in his father.  ROS:   Please see the history of present illness.    All other systems reviewed and are negative.  EKGs/Labs/Other Studies Reviewed:    The  following studies were reviewed today: I discussed my findings with the patient.   Recent Labs: 02/14/2021: ALT 16; BUN 43; Creatinine, Ser 0.91; Hemoglobin 12.5; Platelets 191; Potassium 4.5; Sodium 140; TSH 3.810  Recent Lipid Panel    Component Value Date/Time   CHOL 147 02/14/2021 0853   TRIG 44 02/14/2021 0853   HDL 67 02/14/2021 0853   CHOLHDL 2.2 02/14/2021 0853   CHOLHDL 2.2 04/30/2019 0459   VLDL 7 04/30/2019 0459   LDLCALC 70 02/14/2021 0853   LDLDIRECT 109.8 06/05/2011 0959    Physical Exam:    VS:  BP (!) 148/64   Pulse 69   Ht 5\' 9"  (1.753 m)   Wt 178 lb 1.3 oz (80.8 kg)   SpO2 97%   BMI 26.30 kg/m     Wt Readings from Last 3 Encounters:  08/07/21 178 lb 1.3 oz (80.8 kg)  03/20/21 174 lb (78.9 kg)  02/13/21 173 lb 1.3 oz (78.5 kg)     GEN: Patient is in no acute distress HEENT: Normal NECK: No JVD; No carotid bruits LYMPHATICS: No lymphadenopathy CARDIAC: Hear sounds regular, 2/6 systolic murmur at the apex. RESPIRATORY:  Clear to auscultation without rales, wheezing or rhonchi  ABDOMEN: Soft, non-tender, non-distended MUSCULOSKELETAL:  No edema; No deformity  SKIN: Warm and dry NEUROLOGIC:  Alert and oriented x 3 PSYCHIATRIC:  Normal affect   Signed, Jenean Lindau, MD  08/07/2021 3:14 PM    [facility]

## 2021-08-22 DIAGNOSIS — I10 Essential (primary) hypertension: Secondary | ICD-10-CM | POA: Diagnosis not present

## 2021-08-22 DIAGNOSIS — I350 Nonrheumatic aortic (valve) stenosis: Secondary | ICD-10-CM | POA: Diagnosis not present

## 2021-08-22 DIAGNOSIS — I340 Nonrheumatic mitral (valve) insufficiency: Secondary | ICD-10-CM | POA: Diagnosis not present

## 2021-08-22 DIAGNOSIS — E782 Mixed hyperlipidemia: Secondary | ICD-10-CM | POA: Diagnosis not present

## 2021-08-23 LAB — TSH: TSH: 4.35 u[IU]/mL (ref 0.450–4.500)

## 2021-08-23 LAB — CBC
Hematocrit: 40.2 % (ref 37.5–51.0)
Hemoglobin: 13.5 g/dL (ref 13.0–17.7)
MCH: 31.4 pg (ref 26.6–33.0)
MCHC: 33.6 g/dL (ref 31.5–35.7)
MCV: 94 fL (ref 79–97)
Platelets: 202 10*3/uL (ref 150–450)
RBC: 4.3 x10E6/uL (ref 4.14–5.80)
RDW: 12.5 % (ref 11.6–15.4)
WBC: 6.2 10*3/uL (ref 3.4–10.8)

## 2021-08-23 LAB — HEPATIC FUNCTION PANEL
ALT: 17 IU/L (ref 0–44)
AST: 20 IU/L (ref 0–40)
Albumin: 4.9 g/dL — ABNORMAL HIGH (ref 3.6–4.6)
Alkaline Phosphatase: 71 IU/L (ref 44–121)
Bilirubin Total: 0.9 mg/dL (ref 0.0–1.2)
Bilirubin, Direct: 0.2 mg/dL (ref 0.00–0.40)
Total Protein: 6.7 g/dL (ref 6.0–8.5)

## 2021-08-23 LAB — BASIC METABOLIC PANEL
BUN/Creatinine Ratio: 43 — ABNORMAL HIGH (ref 10–24)
BUN: 37 mg/dL — ABNORMAL HIGH (ref 8–27)
CO2: 25 mmol/L (ref 20–29)
Calcium: 9.5 mg/dL (ref 8.6–10.2)
Chloride: 101 mmol/L (ref 96–106)
Creatinine, Ser: 0.86 mg/dL (ref 0.76–1.27)
Glucose: 97 mg/dL (ref 70–99)
Potassium: 4.6 mmol/L (ref 3.5–5.2)
Sodium: 139 mmol/L (ref 134–144)
eGFR: 86 mL/min/{1.73_m2} (ref >59)

## 2021-08-23 LAB — LIPID PANEL
Chol/HDL Ratio: 3 ratio (ref 0.0–5.0)
Cholesterol, Total: 219 mg/dL — ABNORMAL HIGH (ref 100–199)
HDL: 72 mg/dL (ref >39)
LDL Chol Calc (NIH): 135 mg/dL — ABNORMAL HIGH (ref 0–99)
Triglycerides: 69 mg/dL (ref 0–149)
VLDL Cholesterol Cal: 12 mg/dL (ref 5–40)

## 2021-08-23 NOTE — Addendum Note (Signed)
Addended by: Truddie Hidden on: 08/23/2021 03:46 PM   Modules accepted: Orders

## 2021-08-29 ENCOUNTER — Ambulatory Visit: Payer: PPO

## 2021-08-29 ENCOUNTER — Other Ambulatory Visit: Payer: Self-pay

## 2021-08-29 ENCOUNTER — Ambulatory Visit (INDEPENDENT_AMBULATORY_CARE_PROVIDER_SITE_OTHER): Payer: PPO

## 2021-08-29 DIAGNOSIS — Z0000 Encounter for general adult medical examination without abnormal findings: Secondary | ICD-10-CM

## 2021-08-29 NOTE — Progress Notes (Addendum)
Virtual Visit via Telephone Note  I connected with  [name] [name] on 08/29/21 at  2:30 PM EST by telephone and verified that I am speaking with the correct person using two identifiers.  Medicare Annual Wellness visit completed telephonically due to Covid-19 pandemic.   Persons participating in this call: This Health Coach and this patient.   Location: Patient: Home Provider: Office   I discussed the limitations, risks, security and privacy concerns of performing an evaluation and management service by telephone and the availability of in person appointments. The patient expressed understanding and agreed to proceed.  Unable to perform video visit due to video visit attempted and failed and/or patient does not have video capability.   Some vital signs may be absent or patient reported.   Willette Brace, LPN   Subjective:   [name] [name] is a 85 y.o. male who presents for Medicare Annual/Subsequent preventive examination.  Review of Systems     Cardiac Risk Factors include: advanced age (>71men, >15 women);male gender;dyslipidemia;hypertension     Objective:    There were no vitals filed for this visit. There is no height or weight on file to calculate BMI.  Advanced Directives 08/29/2021 08/23/2020 10/12/2019 04/29/2019 03/12/2017 09/23/2016 02/12/2016  Does Patient Have a Medical Advance Directive? Yes Yes Yes No No Yes No  Type of Paramedic of [place];Living will [place];Living will [place];Living will - - - -  Does patient want to make changes to medical advance directive? - - - - - - -  Copy of [place] in Chart? No - copy requested Yes - validated most recent copy scanned in chart (See row information) - - - - -  Would patient like information on creating a medical advance directive? - - - - - - No - patient declined information  Pre-existing out of facility DNR order (yellow form  or pink MOST form) - - - - - - -    Current Medications (verified) Outpatient Encounter Medications as of 08/29/2021  Medication Sig   aspirin 81 MG chewable tablet Chew 81 mg by mouth daily.   celecoxib (CELEBREX) 200 MG capsule Take 1 capsule (200 mg total) by mouth daily.   Coenzyme Q10 (COQ10) 200 MG CAPS Take 200 mg by mouth at bedtime.   fexofenadine (ALLEGRA) 180 MG tablet Take 180 mg by mouth daily.   fluticasone (FLONASE) 50 MCG/ACT nasal spray Place 1 spray into both nostrils daily.   gabapentin (NEURONTIN) 600 MG tablet Take 1 tablet (600 mg total) by mouth 2 (two) times daily.   Krill Oil 1000 MG CAPS Take 2,000 mg by mouth daily with breakfast.   levothyroxine (SYNTHROID) 25 MCG tablet Take 1 tablet (25 mcg total) by mouth daily before breakfast. Avoid meal at least 30 minutes after taking medication.   losartan (COZAAR) 50 MG tablet TAKE 1 TABLET BY MOUTH EVERY DAY   Multiple Vitamin (MULTIVITAMIN) tablet Take 1 tablet by mouth daily.   potassium citrate (UROCIT-K) 10 MEQ (1080 MG) SR tablet Take 2 tablets (20 mEq total) by mouth 2 (two) times daily with a meal.   Psyllium (METAMUCIL FIBER PO) Take 2 capsules by mouth daily with breakfast.   simvastatin (ZOCOR) 40 MG tablet Take 1 tablet (40 mg total) by mouth at bedtime.   vitamin C (ASCORBIC ACID) 500 MG tablet Take 500 mg by mouth at bedtime.   VITAMIN D PO Take by mouth.  No facility-administered encounter medications on file as of 08/29/2021.    Allergies (verified) Patient has no known allergies.   History: Past Medical History:  Diagnosis Date   Abnormal ankle brachial index (ABI) 07/05/2019   ABI of the toe, at home health visit was abnormal for severe PAD-reading obtained left large toe which has callus formation.   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   BPH (benign prostatic hyperplasia) 04/23/2007   Qualifier: Diagnosis of  By: Paulina Fusi RN, Daine Gravel    Carotid atherosclerosis 01/21/2020   Chronic cough 07/05/2019   Chronic pain 04/29/2019   Degeneration of lumbar intervertebral disc 12/09/2019   Dyslipidemia 04/23/2007   Qualifier: Diagnosis of  By: Teresa Coombs    Elevated TSH 05/13/2019   Essential hypertension 06/02/2008   Qualifier: Diagnosis of  By: Burnice Logan  MD, Doretha Sou    Hypothyroidism 05/13/2019   Itching of ear 05/31/2021   LAE (left atrial enlargement) 05/13/2019   Neuropathy    s/p trauma to left lower ext (horse accident)   Neuropathy of left foot    Osteoarthritis 04/23/2007   Qualifier: Diagnosis of  By: Paulina Fusi RN, Daine Gravel    Palpitations 02/04/2008   Qualifier: Diagnosis of  By: Burnice Logan  MD, Doretha Sou    Sensorineural hearing loss (SNHL), bilateral 06/14/2016   TIA (transient ischemic attack) 05/13/2019   Tinnitus 02/28/2010   deaf left ear(no hearing)   Visual field defect 04/29/2019   Past Surgical History:  Procedure Laterality Date   APPENDECTOMY     CATARACT EXTRACTION Bilateral 04/2020   COLONOSCOPY     HERNIA REPAIR     left foot surgery for fx  Jul 30, 2011   LITHOTRIPSY  1992   cystoscopy with stent placement also done(1 stone fragment remains)   right shoulder replacement  Sep 28, 2009   TOTAL KNEE ARTHROPLASTY Right 08/30/2013   Procedure: RIGHT TOTAL KNEE ARTHROPLASTY RIGHT ;  Surgeon: Gearlean Alf, MD;  Location: [facility];  Service: Orthopedics;  Laterality: Right;   TOTAL SHOULDER ARTHROPLASTY Left 02/02/2015   Procedure: LEFT TOTAL SHOULDER ARTHROPLASTY;  Surgeon: Justice Britain, MD;  Location: [facility];  Service: Orthopedics;  Laterality: Left;   TRANSURETHRAL RESECTION OF PROSTATE  03/27/2012   Procedure: TRANSURETHRAL RESECTION OF THE PROSTATE WITH GYRUS INSTRUMENTS;  Surgeon: Claybon Jabs, MD;  Location: [facility];  Service: Urology;;   Family History  Problem Relation  Age of Onset   Heart disease Mother 30       MI   Stroke Father 35   COPD Sister    Diabetes Sister    Diabetes Brother    Social History   Socioeconomic History   Marital status: Married    Spouse name: Not on file   Number of children: Not on file   Years of education: Not on file   Highest education level: Not on file  Occupational History   Not on file  Tobacco Use   Smoking status: Never   Smokeless tobacco: Never  Vaping Use   Vaping Use: Never used  Substance and Sexual Activity   Alcohol use: No   Drug use: No   Sexual activity: Yes    Partners: Female  Other Topics Concern   Not on file  Social History Narrative   Marital status/children/pets: Married   Education/employment: retired  Social Determinants of Health   Financial Resource Strain: Low Risk    Difficulty of Paying Living Expenses: Not hard at all  Food Insecurity: No Food Insecurity   Worried About Charity fundraiser in the Last Year: Never true   [place] in the Last Year: Never true  Transportation Needs: No Transportation Needs   Lack of Transportation (Medical): No   Lack of Transportation (Non-Medical): No  Physical Activity: Sufficiently Active   Days of Exercise per Week: 5 days   Minutes of Exercise per Session: 40 min  Stress: No Stress Concern Present   Feeling of Stress : Not at all  Social Connections: Moderately Integrated   Frequency of Communication with Friends and Family: More than three times a week   Frequency of Social Gatherings with Friends and Family: More than three times a week   Attends Religious Services: More than 4 times per year   Active Member of [employer] or Organizations: No   Attends Music therapist: Never   Marital Status: Married    Tobacco Counseling Counseling given: Not Answered   Clinical Intake:  Pre-visit preparation completed: Yes  Pain : No/denies pain     BMI - recorded: 26.3 Nutritional Status: BMI 25 -29  Overweight Nutritional Risks: None Diabetes: No  How often do you need to have someone help you when you read instructions, pamphlets, or other written materials from your doctor or pharmacy?: 1 - Never  Diabetic?No  Interpreter Needed?: No  Information entered by :: Charlott Rakes, LPN   Activities of Daily Living In your present state of health, do you have any difficulty performing the following activities: 08/29/2021  Hearing? Y  Vision? N  Difficulty concentrating or making decisions? N  Walking or climbing stairs? N  Dressing or bathing? N  Doing errands, shopping? N  Preparing Food and eating ? N  Using the Toilet? N  In the past six months, have you accidently leaked urine? N  Do you have problems with loss of bowel control? N  Managing your Medications? N  Managing your Finances? N  Housekeeping or managing your Housekeeping? N  Some recent data might be hidden    Patient Care Team: Ma Hillock, DO as PCP - General (Family Medicine) [name], MD as Consulting Physician (Dermatology) Revankar, Reita Cliche, MD as Consulting Physician (Cardiology) Ortho, [place] (Specialist) Odette Fraction [facility])  Indicate any recent Medical Services you may have received from other than [facility] providers in the past year (date may be approximate).     Assessment:   This is a routine wellness examination for [place].  Hearing/Vision screen Hearing Screening - Comments:: Wears hearing aid  Vision Screening - Comments:: Pt follows up with [facility] for annul eye exams   Dietary issues and exercise activities discussed: Current Exercise Habits: Home exercise routine, Type of exercise: Other - see comments, Time (Minutes): 40, Frequency (Times/Week): 5, Weekly Exercise (Minutes/Week): 200   Goals Addressed             This Visit's Progress    Patient Stated       Get more energy        Depression Screen PHQ 2/9 Scores 08/29/2021 03/20/2021  08/23/2020 01/19/2019 04/09/2018 04/01/2016 09/13/2014  PHQ - 2 Score 1 0 0 0 0 0 0    Fall Risk Fall Risk  08/29/2021 03/20/2021 08/23/2020 06/30/2019 01/19/2019  Falls in the past year? 0 0 0 - 0  Number falls in past yr: 0 0 0 - 0  Injury with Fall? 0 0 0 - -  Risk for fall due to : Impaired vision - - - -  Risk for fall due to: Comment stumble at times - - - -  Follow up Falls prevention discussed Falls evaluation completed Falls prevention discussed Falls evaluation completed -  Comment - - - Completed by [employer] home visit -    [place]:  Any stairs in or around the home? Yes  If so, are there any without handrails? No  Home free of loose throw rugs in walkways, pet beds, electrical cords, etc? Yes  Adequate lighting in your home to reduce risk of falls? Yes   ASSISTIVE DEVICES UTILIZED TO PREVENT FALLS:  Life alert? No  Use of a cane, walker or w/c? No  Grab bars in the bathroom? Yes  Shower chair or bench in shower? Yes  Elevated toilet seat or a handicapped toilet? No   TIMED UP AND GO:  Was the test performed? No .   Cognitive Function:     6CIT Screen 08/29/2021  What Year? 0 points  What month? 0 points  What time? 0 points  Count back from 20 0 points  Months in reverse 0 points  Repeat phrase 2 points  Total Score 2    Immunizations Immunization History  Administered Date(s) Administered   Fluad Quad(high Dose 65+) 05/27/2019   Influenza Split 06/12/2011   Influenza Whole 05/31/2010   Influenza, High Dose Seasonal PF 09/07/2014, 07/10/2016, 05/14/2017, 06/28/2018, 06/11/2021   Influenza-Unspecified 09/06/2014, 05/19/2017, 07/03/2020   PFIZER Comirnaty(Gray Top)Covid-19 Tri-Sucrose Vaccine 03/02/2021   PFIZER(Purple Top)SARS-COV-2 Vaccination 11/08/2019, 11/29/2019, 06/19/2020   Pfizer Covid-19 Vaccine Bivalent Booster 56yrs & up 08/15/2021   Pneumococcal Conjugate-13 09/07/2014   Pneumococcal Polysaccharide-23  05/14/2017   Td 05/16/2009   Tdap 02/11/2016   Zoster Recombinat (Shingrix) 05/04/2021, 07/11/2021   Zoster, Live 06/12/2011    TDAP status: Up to date  Flu Vaccine status: Up to date  Pneumococcal vaccine status: Up to date  Covid-19 vaccine status: Completed vaccines  Qualifies for Shingles Vaccine? Yes   Zostavax completed Yes   Shingrix Completed?: Yes  Screening Tests Health Maintenance  Topic Date Due   TETANUS/TDAP  02/10/2026   Pneumonia Vaccine 74+ Years old  Completed   INFLUENZA VACCINE  Completed   COVID-19 Vaccine  Completed   Zoster Vaccines- Shingrix  Completed   HPV VACCINES  Aged Out    Health Maintenance  There are no preventive care reminders to display for this patient.  Colorectal cancer screening: No longer required.     Additional Screening:   Vision Screening: Recommended annual ophthalmology exams for early detection of glaucoma and other disorders of the eye. Is the patient up to date with their annual eye exam?  Yes  Who is the provider or what is the name of the office in which the patient attends annual eye exams? [facility]  If pt is not established with a provider, would they like to be referred to a provider to establish care? No .   Dental Screening: Recommended annual dental exams for proper oral hygiene  Community Resource Referral / Chronic Care Management: CRR required this visit?  No   CCM required this visit?  No      Plan:     I have personally reviewed and noted the following in the patients chart:   Medical and social  history Use of alcohol, tobacco or illicit drugs  Current medications and supplements including opioid prescriptions. Patient is not currently taking opioid prescriptions. Functional ability and status Nutritional status Physical activity Advanced directives List of other physicians Hospitalizations, surgeries, and ER visits in previous 12 months Vitals Screenings to include cognitive,  depression, and falls Referrals and appointments  In addition, I have reviewed and discussed with patient certain preventive protocols, quality metrics, and best practice recommendations. A written personalized care plan for preventive services as well as general preventive health recommendations were provided to patient.     Willette Brace, LPN   [id number]   Nurse Notes: None

## 2021-08-29 NOTE — Patient Instructions (Signed)
[name] [name] , Thank you for taking time to come for your Medicare Wellness Visit. I appreciate your ongoing commitment to your health goals. Please review the following plan we discussed and let me know if I can assist you in the future.   Screening recommendations/referrals: Colonoscopy: no longer required  Recommended yearly ophthalmology/optometry visit for glaucoma screening and checkup Recommended yearly dental visit for hygiene and checkup  Vaccinations: Influenza vaccine: Done 06/11/21 repeat every year Pneumococcal vaccine: Up to date Tdap vaccine: Done 02/11/16 repeat every 10 year  Shingles vaccine: Completed 8/19, 07/11/21   Covid-19: Completed 2/22, 3/15, 06/19/20, 6/17 & 08/15/21  Advanced directives: Please bring a copy of your health care power of attorney and living will to the office at your convenience.  Conditions/risks identified: Get more energy   Next appointment: Follow up in one year for your annual wellness visit.   Preventive Care 38 Years and Older, Male Preventive care refers to lifestyle choices and visits with your health care provider that can promote health and wellness. What does preventive care include? A yearly physical exam. This is also called an annual well check. Dental exams once or twice a year. Routine eye exams. Ask your health care provider how often you should have your eyes checked. Personal lifestyle choices, including: Daily care of your teeth and gums. Regular physical activity. Eating a healthy diet. Avoiding tobacco and drug use. Limiting alcohol use. Practicing safe sex. Taking low doses of aspirin every day. Taking vitamin and mineral supplements as recommended by your health care provider. What happens during an annual well check? The services and screenings done by your health care provider during your annual well check will depend on your age, overall health, lifestyle risk factors, and family history of disease. Counseling   Your health care provider may ask you questions about your: Alcohol use. Tobacco use. Drug use. Emotional well-being. Home and relationship well-being. Sexual activity. Eating habits. History of falls. Memory and ability to understand (cognition). Work and work Statistician. Screening  You may have the following tests or measurements: Height, weight, and BMI. Blood pressure. Lipid and cholesterol levels. These may be checked every 5 years, or more frequently if you are over 57 years old. Skin check. Lung cancer screening. You may have this screening every year starting at age 38 if you have a 30-pack-year history of smoking and currently smoke or have quit within the past 15 years. Fecal occult blood test (FOBT) of the stool. You may have this test every year starting at age 87. Flexible sigmoidoscopy or colonoscopy. You may have a sigmoidoscopy every 5 years or a colonoscopy every 10 years starting at age 62. Prostate cancer screening. Recommendations will vary depending on your family history and other risks. Hepatitis C blood test. Hepatitis B blood test. Sexually transmitted disease (STD) testing. Diabetes screening. This is done by checking your blood sugar (glucose) after you have not eaten for a while (fasting). You may have this done every 1-3 years. Abdominal aortic aneurysm (AAA) screening. You may need this if you are a current or former smoker. Osteoporosis. You may be screened starting at age 14 if you are at high risk. Talk with your health care provider about your test results, treatment options, and if necessary, the need for more tests. Vaccines  Your health care provider may recommend certain vaccines, such as: Influenza vaccine. This is recommended every year. Tetanus, diphtheria, and acellular pertussis (Tdap, Td) vaccine. You may need a Td booster every 10  years. Zoster vaccine. You may need this after age 34. Pneumococcal 13-valent conjugate (PCV13) vaccine.  One dose is recommended after age 17. Pneumococcal polysaccharide (PPSV23) vaccine. One dose is recommended after age 67. Talk to your health care provider about which screenings and vaccines you need and how often you need them. This information is not intended to replace advice given to you by your health care provider. Make sure you discuss any questions you have with your health care provider. Document Released: 09/29/2015 Document Revised: 05/22/2016 Document Reviewed: 07/04/2015 Elsevier Interactive Patient Education  2017 [place] Prevention in the Home Falls can cause injuries. They can happen to people of all ages. There are many things you can do to make your home safe and to help prevent falls. What can I do on the outside of my home? Regularly fix the edges of walkways and driveways and fix any cracks. Remove anything that might make you trip as you walk through a door, such as a raised step or threshold. Trim any bushes or trees on the path to your home. Use bright outdoor lighting. Clear any walking paths of anything that might make someone trip, such as rocks or tools. Regularly check to see if handrails are loose or broken. Make sure that both sides of any steps have handrails. Any raised decks and porches should have guardrails on the edges. Have any leaves, snow, or ice cleared regularly. Use sand or salt on walking paths during winter. Clean up any spills in your garage right away. This includes oil or grease spills. What can I do in the bathroom? Use night lights. Install grab bars by the toilet and in the tub and shower. Do not use towel bars as grab bars. Use non-skid mats or decals in the tub or shower. If you need to sit down in the shower, use a plastic, non-slip stool. Keep the floor dry. Clean up any water that spills on the floor as soon as it happens. Remove soap buildup in the tub or shower regularly. Attach bath mats securely with double-sided  non-slip rug tape. Do not have throw rugs and other things on the floor that can make you trip. What can I do in the bedroom? Use night lights. Make sure that you have a light by your bed that is easy to reach. Do not use any sheets or blankets that are too big for your bed. They should not hang down onto the floor. Have a firm chair that has side arms. You can use this for support while you get dressed. Do not have throw rugs and other things on the floor that can make you trip. What can I do in the kitchen? Clean up any spills right away. Avoid walking on wet floors. Keep items that you use a lot in easy-to-reach places. If you need to reach something above you, use a strong step stool that has a grab bar. Keep electrical cords out of the way. Do not use floor polish or wax that makes floors slippery. If you must use wax, use non-skid floor wax. Do not have throw rugs and other things on the floor that can make you trip. What can I do with my stairs? Do not leave any items on the stairs. Make sure that there are handrails on both sides of the stairs and use them. Fix handrails that are broken or loose. Make sure that handrails are as long as the stairways. Check any carpeting to make sure  that it is firmly attached to the stairs. Fix any carpet that is loose or worn. Avoid having throw rugs at the top or bottom of the stairs. If you do have throw rugs, attach them to the floor with carpet tape. Make sure that you have a light switch at the top of the stairs and the bottom of the stairs. If you do not have them, ask someone to add them for you. What else can I do to help prevent falls? Wear shoes that: Do not have high heels. Have rubber bottoms. Are comfortable and fit you well. Are closed at the toe. Do not wear sandals. If you use a stepladder: Make sure that it is fully opened. Do not climb a closed stepladder. Make sure that both sides of the stepladder are locked into place. Ask  someone to hold it for you, if possible. Clearly mark and make sure that you can see: Any grab bars or handrails. First and last steps. Where the edge of each step is. Use tools that help you move around (mobility aids) if they are needed. These include: Canes. Walkers. Scooters. Crutches. Turn on the lights when you go into a dark area. Replace any light bulbs as soon as they burn out. Set up your furniture so you have a clear path. Avoid moving your furniture around. If any of your floors are uneven, fix them. If there are any pets around you, be aware of where they are. Review your medicines with your doctor. Some medicines can make you feel dizzy. This can increase your chance of falling. Ask your doctor what other things that you can do to help prevent falls. This information is not intended to replace advice given to you by your health care provider. Make sure you discuss any questions you have with your health care provider. Document Released: 06/29/2009 Document Revised: 02/08/2016 Document Reviewed: 10/07/2014 Elsevier Interactive Patient Education  2017 [employer].

## 2021-09-04 ENCOUNTER — Other Ambulatory Visit: Payer: Self-pay

## 2021-09-04 ENCOUNTER — Encounter: Payer: Self-pay | Admitting: Family Medicine

## 2021-09-04 ENCOUNTER — Ambulatory Visit (INDEPENDENT_AMBULATORY_CARE_PROVIDER_SITE_OTHER): Payer: PPO | Admitting: Family Medicine

## 2021-09-04 VITALS — BP 117/64 | HR 66 | Temp 97.6°F | Ht 69.0 in | Wt 179.0 lb

## 2021-09-04 DIAGNOSIS — L089 Local infection of the skin and subcutaneous tissue, unspecified: Secondary | ICD-10-CM | POA: Diagnosis not present

## 2021-09-04 NOTE — Patient Instructions (Addendum)
°  Great to see you today.  I have refilled the medication(s) we provide.   If labs were collected, we will inform you of lab results once received either by echart message or telephone call.   - echart message- for normal results that have been seen by the patient already.   - telephone call: abnormal results or if patient has not viewed results in their echart.  Your elbow does  not  look infected today. Use bag balm to keep from cracking.

## 2021-09-04 NOTE — Progress Notes (Signed)
This visit occurred during the SARS-CoV-2 public health emergency.  Safety protocols were in place, including screening questions prior to the visit, additional usage of staff PPE, and extensive cleaning of exam room while observing appropriate contact time as indicated for disinfecting solutions.    [name] [name] , [birth year], 85 y.o., male MRN: [id number] Patient Care Team    Relationship Specialty Notifications Start End  Ma Hillock, DO PCP - General Family Medicine  01/19/19   [name], MD Consulting Physician Dermatology  01/19/20   Revankar, Reita Cliche, MD Consulting Physician Cardiology  01/19/20   Manson Passey, Emerge  Specialist  01/19/20   Odette Fraction  Optometry  01/19/20     Chief Complaint  Patient presents with   Spot on elbow    X 3 weeks; has been cleaning with peroxide for 1 week and stopped. Location on left elbow     Subjective: Pt presents for an OV with complaints of skin dryness and infection of his left elbow.  He states he noticed his left elbow was dry cracked, became red and had a small "abscess.  "He has been soaking it in peroxide for approximately 3 weeks and feels the infection has subsided.  He states he still has a dryness and a scab formation and would like to have it checked today to ensure not infectious.  He denies other areas of dry scaly patchy skin or history of psoriasis/eczema  Depression screen [facility] 2/9 08/29/2021 03/20/2021 08/23/2020 01/19/2019 04/09/2018  Decreased Interest 0 0 0 0 0  Down, Depressed, Hopeless 1 0 0 0 0  PHQ - 2 Score 1 0 0 0 0    No Known Allergies Social History   Social History Narrative   Marital status/children/pets: Married   Education/employment: retired   Past Medical History:  Diagnosis Date   Abnormal ankle brachial index (ABI) 07/05/2019   ABI of the toe, at home health visit was abnormal for severe PAD-reading obtained left large toe which has callus formation.   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   BPH (benign prostatic hyperplasia) 04/23/2007   Qualifier: Diagnosis of  By: Paulina Fusi RN, Daine Gravel    Carotid atherosclerosis 01/21/2020   Chronic cough 07/05/2019   Chronic pain 04/29/2019   Degeneration of lumbar intervertebral disc 12/09/2019   Dyslipidemia 04/23/2007   Qualifier: Diagnosis of  By: Teresa Coombs    Elevated TSH 05/13/2019   Essential hypertension 06/02/2008   Qualifier: Diagnosis of  By: Burnice Logan  MD, Doretha Sou    Hypothyroidism 05/13/2019   Itching of ear 05/31/2021   LAE (left atrial enlargement) 05/13/2019   Neuropathy    s/p trauma to left lower ext (horse accident)   Neuropathy of left foot    Osteoarthritis 04/23/2007   Qualifier: Diagnosis of  By: Paulina Fusi RN, Daine Gravel    Palpitations 02/04/2008   Qualifier: Diagnosis of  By: Burnice Logan  MD, Doretha Sou    Sensorineural hearing loss (SNHL), bilateral 06/14/2016   TIA (transient ischemic attack) 05/13/2019   Tinnitus 02/28/2010   deaf left ear(no hearing)   Visual field defect 04/29/2019   Past Surgical History:  Procedure Laterality Date   APPENDECTOMY     CATARACT EXTRACTION Bilateral 04/2020   COLONOSCOPY     HERNIA REPAIR     left foot surgery for fx  Jul 30, 2011   LITHOTRIPSY  1992   cystoscopy with stent placement also done(1 stone fragment remains)   right shoulder replacement  Sep 28, 2009   TOTAL KNEE ARTHROPLASTY Right 08/30/2013   Procedure: RIGHT TOTAL KNEE ARTHROPLASTY RIGHT ;  Surgeon: Gearlean Alf, MD;  Location: [facility];  Service: Orthopedics;  Laterality: Right;   TOTAL SHOULDER ARTHROPLASTY Left 02/02/2015   Procedure: LEFT TOTAL SHOULDER ARTHROPLASTY;  Surgeon: Justice Britain, MD;  Location: [facility];  Service: Orthopedics;  Laterality: Left;   TRANSURETHRAL RESECTION OF PROSTATE  03/27/2012   Procedure: TRANSURETHRAL  RESECTION OF THE PROSTATE WITH GYRUS INSTRUMENTS;  Surgeon: Claybon Jabs, MD;  Location: [facility];  Service: Urology;;   Family History  Problem Relation Age of Onset   Heart disease Mother 67       MI   Stroke Father 2   COPD Sister    Diabetes Sister    Diabetes Brother    Allergies as of 09/04/2021   No Known Allergies      Medication List        Accurate as of September 04, 2021  4:42 PM. If you have any questions, ask your nurse or doctor.          aspirin 81 MG chewable tablet Chew 81 mg by mouth daily.   celecoxib 200 MG capsule Commonly known as: CELEBREX Take 1 capsule (200 mg total) by mouth daily.   CoQ10 200 MG Caps Take 200 mg by mouth at bedtime.   fexofenadine 180 MG tablet Commonly known as: ALLEGRA Take 180 mg by mouth daily.   fluticasone 50 MCG/ACT nasal spray Commonly known as: FLONASE Place 1 spray into both nostrils daily.   gabapentin 600 MG tablet Commonly known as: NEURONTIN Take 1 tablet (600 mg total) by mouth 2 (two) times daily.   Krill Oil 1000 MG Caps Take 2,000 mg by mouth daily with breakfast.   levothyroxine 25 MCG tablet Commonly known as: SYNTHROID Take 1 tablet (25 mcg total) by mouth daily before breakfast. Avoid meal at least 30 minutes after taking medication.   losartan 50 MG tablet Commonly known as: COZAAR TAKE 1 TABLET BY MOUTH EVERY DAY   METAMUCIL FIBER PO Take 2 capsules by mouth daily with breakfast.   multivitamin tablet Take 1 tablet by mouth daily.   potassium citrate 10 MEQ (1080 MG) SR tablet Commonly known as: UROCIT-K Take 2 tablets (20 mEq total) by mouth 2 (two) times daily with a meal.   simvastatin 40 MG tablet Commonly known as: ZOCOR Take 1 tablet (40 mg total) by mouth at bedtime.   vitamin C 500 MG tablet Commonly known as: ASCORBIC ACID Take 500 mg by mouth at bedtime.   VITAMIN D PO Take by mouth.        All past medical history, surgical history, allergies, family  history, immunizations andmedications were updated in the EMR today and reviewed under the history and medication portions of their EMR.     ROS Negative, with the exception of above mentioned in HPI   Objective:  BP 117/64    Pulse 66    Temp 97.6 F (36.4 C) (Oral)    Ht 5\' 9"  (1.753 m)    Wt 179 lb (81.2 kg)    SpO2 98%    BMI 26.43 kg/m  Body mass index is 26.43 kg/m.  Physical Exam Vitals and nursing note reviewed.  Constitutional:      General: He is  not in acute distress.    Appearance: Normal appearance. He is not ill-appearing, toxic-appearing or diaphoretic.  HENT:     Head: Normocephalic and atraumatic.     Mouth/Throat:     Mouth: Mucous membranes are moist.  Eyes:     General: No scleral icterus.       Right eye: No discharge.        Left eye: No discharge.     Extraocular Movements: Extraocular movements intact.     Pupils: Pupils are equal, round, and reactive to light.  Pulmonary:     Effort: No respiratory distress.     Breath sounds: No wheezing, rhonchi or rales.  Lymphadenopathy:     Cervical: No cervical adenopathy.  Skin:    General: Skin is warm and dry.     Coloration: Skin is not jaundiced or pale.     Findings: Rash present. No bruising, erythema, laceration or wound. Rash is crusting and scaling.          Comments: No erythema.  Healing Abscess.  No signs of infection today.  Dry scaly.  Neurological:     Mental Status: He is alert and oriented to person, place, and time. Mental status is at baseline.  Psychiatric:        Mood and Affect: Mood normal.        Behavior: Behavior normal.        Thought Content: Thought content normal.        Judgment: Judgment normal.     No results found. No results found. No results found for this or any previous visit (from the past 24 hour(s)).  Assessment/Plan: [name] [name] is a 85 y.o. male present for OV for  1. Skin infection If there was an abscess that is healing well.  He does have  cracked dry scaly skin over his left elbow and a small area of scab formation.  No signs of infection or drainage on exam today.  Encouraged him to use BB ointment over elbow nightly and cover to heal dry cracked skin.  Follow-up as needed  Reviewed expectations re: course of current medical issues. Discussed self-management of symptoms. Outlined signs and symptoms indicating need for more acute intervention. Patient verbalized understanding and all questions were answered. Patient received an After-Visit Summary.    No orders of the defined types were placed in this encounter.  No orders of the defined types were placed in this encounter.  Referral Orders  No referral(s) requested today     Note is dictated utilizing voice recognition software. Although note has been proof read prior to signing, occasional typographical errors still can be missed. If any questions arise, please do not hesitate to call for verification.   electronically signed by:  Howard Pouch, DO  [place]

## 2021-09-16 ENCOUNTER — Other Ambulatory Visit: Payer: Self-pay | Admitting: Family Medicine

## 2021-09-18 ENCOUNTER — Telehealth: Payer: Self-pay | Admitting: Family Medicine

## 2021-09-18 NOTE — Telephone Encounter (Signed)
Refill already taken care of. Pt will need [facility] OV

## 2021-09-18 NOTE — Telephone Encounter (Signed)
Pt wife called in needing refill of simvastatin 3 month supply of it, pt wife said they just had a recent office visit.   simvastatin simvastatin (ZOCOR) 40 MG tablet    [facility] # [place], [place] Phone:  [phone]  Fax:  [phone]

## 2021-10-08 ENCOUNTER — Encounter: Payer: Self-pay | Admitting: Family Medicine

## 2021-10-08 ENCOUNTER — Ambulatory Visit (INDEPENDENT_AMBULATORY_CARE_PROVIDER_SITE_OTHER): Payer: PPO | Admitting: Family Medicine

## 2021-10-08 ENCOUNTER — Other Ambulatory Visit: Payer: Self-pay

## 2021-10-08 VITALS — BP 135/67 | HR 69 | Temp 98.3°F | Ht 69.0 in | Wt 182.0 lb

## 2021-10-08 DIAGNOSIS — N400 Enlarged prostate without lower urinary tract symptoms: Secondary | ICD-10-CM | POA: Diagnosis not present

## 2021-10-08 DIAGNOSIS — I350 Nonrheumatic aortic (valve) stenosis: Secondary | ICD-10-CM

## 2021-10-08 DIAGNOSIS — I10 Essential (primary) hypertension: Secondary | ICD-10-CM

## 2021-10-08 DIAGNOSIS — M159 Polyosteoarthritis, unspecified: Secondary | ICD-10-CM | POA: Diagnosis not present

## 2021-10-08 DIAGNOSIS — I340 Nonrheumatic mitral (valve) insufficiency: Secondary | ICD-10-CM

## 2021-10-08 DIAGNOSIS — E785 Hyperlipidemia, unspecified: Secondary | ICD-10-CM | POA: Diagnosis not present

## 2021-10-08 DIAGNOSIS — I6523 Occlusion and stenosis of bilateral carotid arteries: Secondary | ICD-10-CM

## 2021-10-08 DIAGNOSIS — G629 Polyneuropathy, unspecified: Secondary | ICD-10-CM

## 2021-10-08 DIAGNOSIS — G459 Transient cerebral ischemic attack, unspecified: Secondary | ICD-10-CM

## 2021-10-08 DIAGNOSIS — E039 Hypothyroidism, unspecified: Secondary | ICD-10-CM

## 2021-10-08 DIAGNOSIS — M150 Primary generalized (osteo)arthritis: Secondary | ICD-10-CM

## 2021-10-08 MED ORDER — POTASSIUM CITRATE ER 10 MEQ (1080 MG) PO TBCR: 20.0000 meq | EXTENDED_RELEASE_TABLET | Freq: Two times a day (BID) | ORAL | 5 refills | Status: DC

## 2021-10-08 MED ORDER — CELECOXIB 200 MG PO CAPS
200.0000 mg | ORAL_CAPSULE | Freq: Every day | ORAL | 1 refills | Status: DC
Start: 2021-10-08 — End: 2022-03-22

## 2021-10-08 MED ORDER — LEVOTHYROXINE SODIUM 50 MCG PO TABS: 50.0000 ug | ORAL_TABLET | Freq: Every day | ORAL | 3 refills | Status: DC

## 2021-10-08 MED ORDER — LEVOTHYROXINE SODIUM 25 MCG PO TABS: 25.0000 ug | ORAL_TABLET | Freq: Every day | ORAL | 4 refills | Status: DC

## 2021-10-08 MED ORDER — GABAPENTIN 600 MG PO TABS: 600.0000 mg | ORAL_TABLET | Freq: Two times a day (BID) | ORAL | 1 refills | Status: DC

## 2021-10-08 MED ORDER — LOSARTAN POTASSIUM 50 MG PO TABS: 50.0000 mg | ORAL_TABLET | Freq: Every day | ORAL | 1 refills | Status: DC

## 2021-10-08 MED ORDER — SIMVASTATIN 40 MG PO TABS
40.0000 mg | ORAL_TABLET | Freq: Every day | ORAL | 3 refills | Status: DC
Start: 2021-10-08 — End: 2022-03-25

## 2021-10-08 NOTE — Patient Instructions (Signed)
Great to see you today.  I have refilled the medication(s) we provide.   If labs were collected, we will inform you of lab results once received either by echart message or telephone call.   - echart message- for normal results that have been seen by the patient already.   - telephone call: abnormal results or if patient has not viewed results in their echart.  

## 2021-10-08 NOTE — Progress Notes (Signed)
SUBJECTIVE  Patient Care Team    Relationship Specialty Notifications Start End  Ma Hillock, DO PCP - General Family Medicine  01/19/19   [name], MD Consulting Physician Dermatology  01/19/20   Revankar, Reita Cliche, MD Consulting Physician Cardiology  01/19/20   Manson Passey, Emerge  Specialist  01/19/20   Odette Fraction  Optometry  01/19/20     Chief Complaint  Patient presents with   Hypertension    [facility]; pt is not fasting    HPI: [name] [name] is a 86 y.o. male present for chronic medical condition follow-up  Hypertension/hyperlipidemia: Pt reports compliance  with losartan 50, Zocor 40 mg nightly. Patient denies chest pain, shortness of breath, dizziness or lower extremity edema.  Pt takes a daily baby ASA. Pt is  prescribed statin. Labs up-to-date. 08/2021 Diet: Monitors his diet Exercise: Routine exercise RF: Hypertension, hyperlipidemia, family history of heart disease and stroke  BPH/arthritis/neuropathy: Patient has a history of arthritis status post shoulder replacement and knee arthroplasty.  He also has a history of BPH/s/p TURP.  He states he takes Celebrex 200 mg daily elps with his arthritis and bph. He did try the Flomax and it was not helpful.  He sustained a lower extremity fracture injury in which he has chronic neuropathy.  Celebrex 200 mg daily and gabapentin 200 mg twice daily is very helpful for his pain.   ROS: See pertinent positives and negatives per HPI.  Patient Active Problem List   Diagnosis Date Noted   Mild aortic stenosis 08/07/2021   Moderate mitral regurgitation 08/07/2021   Carotid atherosclerosis 01/21/2020   Degeneration of lumbar intervertebral disc 12/09/2019   Abnormal ankle brachial index (ABI) 07/05/2019   Chronic cough 07/05/2019   LAE (left atrial enlargement) 05/13/2019   Aortic valve calcification 05/13/2019   TIA (transient ischemic attack) 05/13/2019   Hypothyroidism 05/13/2019   Abnormal echocardiogram 05/13/2019    Chronic pain 04/29/2019   Visual field defect 04/29/2019   Neuropathy- chronic s/p trauma 01/20/2019   Asymmetrical sensorineural hearing loss 06/14/2016   Tinnitus 02/28/2010   Essential hypertension 06/02/2008   Palpitations 02/04/2008   Dyslipidemia 04/23/2007   BPH (benign prostatic hyperplasia) 04/23/2007    Class: Present on Admission   Osteoarthritis 04/23/2007    Social History   Tobacco Use   Smoking status: Never   Smokeless tobacco: Never  Substance Use Topics   Alcohol use: No    Current Outpatient Medications:    aspirin 81 MG chewable tablet, Chew 81 mg by mouth daily., Disp: , Rfl:    Coenzyme Q10 (COQ10) 200 MG CAPS, Take 200 mg by mouth at bedtime., Disp: , Rfl:    fexofenadine (ALLEGRA) 180 MG tablet, Take 180 mg by mouth daily., Disp: , Rfl:    fluticasone (FLONASE) 50 MCG/ACT nasal spray, Place 1 spray into both nostrils daily., Disp: , Rfl:    Krill Oil 1000 MG CAPS, Take 2,000 mg by mouth daily with breakfast., Disp: , Rfl:    Multiple Vitamin (MULTIVITAMIN) tablet, Take 1 tablet by mouth daily., Disp: , Rfl:    Psyllium (METAMUCIL FIBER PO), Take 2 capsules by mouth daily with breakfast., Disp: , Rfl:    vitamin C (ASCORBIC ACID) 500 MG tablet, Take 500 mg by mouth at bedtime., Disp: , Rfl:    VITAMIN D PO, Take by mouth., Disp: , Rfl:    celecoxib (CELEBREX) 200 MG capsule, Take 1 capsule (200 mg total) by mouth daily., Disp: 90 capsule, Rfl:  1   gabapentin (NEURONTIN) 600 MG tablet, Take 1 tablet (600 mg total) by mouth 2 (two) times daily., Disp: 180 tablet, Rfl: 1   levothyroxine (SYNTHROID) 50 MCG tablet, Take 1 tablet (50 mcg total) by mouth daily before breakfast. Avoid meal at least 30 minutes after taking medication., Disp: 90 tablet, Rfl: 3   losartan (COZAAR) 50 MG tablet, Take 1 tablet (50 mg total) by mouth daily., Disp: 90 tablet, Rfl: 1   potassium citrate (UROCIT-K) 10 MEQ (1080 MG) SR tablet, Take 2 tablets (20 mEq total) by mouth 2 (two)  times daily with a meal., Disp: 120 tablet, Rfl: 5   simvastatin (ZOCOR) 40 MG tablet, Take 1 tablet (40 mg total) by mouth daily at 6 PM., Disp: 90 tablet, Rfl: 3  No Known Allergies  OBJECTIVE: BP 135/67    Pulse 69    Temp 98.3 F (36.8 C) (Oral)    Ht 5\' 9"  (1.753 m)    Wt 182 lb (82.6 kg)    SpO2 99%    BMI 26.88 kg/m  Physical Exam Vitals and nursing note reviewed.  Constitutional:      General: He is not in acute distress.    Appearance: Normal appearance. He is not ill-appearing, toxic-appearing or diaphoretic.  HENT:     Head: Normocephalic and atraumatic.     Mouth/Throat:     Mouth: Mucous membranes are moist.  Eyes:     General: No scleral icterus.       Right eye: No discharge.        Left eye: No discharge.     Extraocular Movements: Extraocular movements intact.     Pupils: Pupils are equal, round, and reactive to light.  Cardiovascular:     Rate and Rhythm: Normal rate and regular rhythm.  Pulmonary:     Effort: Pulmonary effort is normal. No respiratory distress.     Breath sounds: Normal breath sounds. No wheezing, rhonchi or rales.  Musculoskeletal:     Cervical back: Neck supple.  Lymphadenopathy:     Cervical: No cervical adenopathy.  Skin:    General: Skin is warm and dry.     Coloration: Skin is not jaundiced or pale.     Findings: No rash.  Neurological:     Mental Status: He is alert and oriented to person, place, and time. Mental status is at baseline.  Psychiatric:        Mood and Affect: Mood normal.        Behavior: Behavior normal.        Thought Content: Thought content normal.        Judgment: Judgment normal.     ASSESSMENT AND PLAN: [name] is a 86 y.o. male present for  Benign prostatic hyperplasia without lower urinary tract symptoms/nephrolithiasis/elevated PSA -stable  Continue celebrex. - flomax tried and reported not helpful.  - continue potassium citrate Has been referred to urology  Dyslipidemia/Essential  hypertension/Overweight (BMI 25.0-29.9) -stable.  Continue  losartan-prescribed by cardiology -continue  Zocor 40 mg daily> [facility] - continue baby aspirin - low sodium diet and routine exercise.  Follow-up 5.5 months, sooner if blood pressure above goal.  Neuropathy- chronic s/p trauma -stable.  status post trauma to lower extremity. -continue gabapentin 600 mg twice daily  Primary osteoarthritis involving multiple joints -stable.  Continue  Celebrex  Hypothyroidism: Increased levo to 50 mcg. Still fatigued, tsh 4.3.  Recheck next visit.   Abnormal ABI: Discussed signs and symptoms for him to  monitor for decreased circulation.  At this time he would like to wait on any further testing.   Follow-up in 5.5 months provider appointment on chronic medical conditions   No orders of the defined types were placed in this encounter.   Meds ordered this encounter  Medications   celecoxib (CELEBREX) 200 MG capsule    Sig: Take 1 capsule (200 mg total) by mouth daily.    Dispense:  90 capsule    Refill:  1   gabapentin (NEURONTIN) 600 MG tablet    Sig: Take 1 tablet (600 mg total) by mouth 2 (two) times daily.    Dispense:  180 tablet    Refill:  1   simvastatin (ZOCOR) 40 MG tablet    Sig: Take 1 tablet (40 mg total) by mouth daily at 6 PM.    Dispense:  90 tablet    Refill:  3   DISCONTD: levothyroxine (SYNTHROID) 25 MCG tablet    Sig: Take 1 tablet (25 mcg total) by mouth daily before breakfast. Avoid meal at least 30 minutes after taking medication.    Dispense:  90 tablet    Refill:  4   losartan (COZAAR) 50 MG tablet    Sig: Take 1 tablet (50 mg total) by mouth daily.    Dispense:  90 tablet    Refill:  1   potassium citrate (UROCIT-K) 10 MEQ (1080 MG) SR tablet    Sig: Take 2 tablets (20 mEq total) by mouth 2 (two) times daily with a meal.    Dispense:  120 tablet    Refill:  5   levothyroxine (SYNTHROID) 50 MCG tablet    Sig: Take 1 tablet (50 mcg total) by mouth  daily before breakfast. Avoid meal at least 30 minutes after taking medication.    Dispense:  90 tablet    Refill:  3    Dc prior doses- hold until request    Referral Orders  No referral(s) requested today      Howard Pouch, DO 10/08/2021

## 2021-10-09 ENCOUNTER — Telehealth: Payer: Self-pay | Admitting: Family Medicine

## 2021-10-09 MED ORDER — POTASSIUM CITRATE ER 10 MEQ (1080 MG) PO TBCR: 20.0000 meq | EXTENDED_RELEASE_TABLET | Freq: Two times a day (BID) | ORAL | 1 refills | Status: DC

## 2021-10-09 NOTE — Telephone Encounter (Signed)
Pt called back and said it was supposed to go to [facility] and not [facility]

## 2021-10-09 NOTE — Telephone Encounter (Signed)
Pt called back and said that he needed the potassium citrate (UROCIT-K) 10 MEQ (1080 MG) SR tablet sent in for a 90 d/s and not 30 because its cheaper for the 90. Please advise. Call back if needed is [phone]

## 2021-10-09 NOTE — Telephone Encounter (Signed)
Rx sent 

## 2021-10-10 MED ORDER — POTASSIUM CITRATE ER 10 MEQ (1080 MG) PO TBCR: 20.0000 meq | EXTENDED_RELEASE_TABLET | Freq: Two times a day (BID) | ORAL | 1 refills | Status: DC

## 2021-10-10 NOTE — Telephone Encounter (Signed)
Pt came by the office because he said that [facility] had filled the med and that [facility] would probably not fill it, I let him know all he would need to do is call [facility] and have them cancel and then [facility] could fill, he said that this wasn't his fault and he shouldn't have to call and so I told him I would call to cancel and that he would just need to call [facility] after they open to have them fill and he was fine with that. I called [facility] and they said they would cancel and back it out

## 2021-10-10 NOTE — Addendum Note (Signed)
Addended by: Kavin Leech on: 10/10/2021 08:36 AM   Modules accepted: Orders

## 2021-10-10 NOTE — Telephone Encounter (Signed)
Rx resent.

## 2021-11-08 ENCOUNTER — Other Ambulatory Visit: Payer: Self-pay

## 2021-11-08 ENCOUNTER — Encounter: Payer: Self-pay | Admitting: Cardiology

## 2021-11-09 ENCOUNTER — Encounter: Payer: Self-pay | Admitting: Cardiology

## 2021-11-09 ENCOUNTER — Other Ambulatory Visit: Payer: Self-pay

## 2021-11-09 ENCOUNTER — Ambulatory Visit: Payer: PPO | Admitting: Cardiology

## 2021-11-09 VITALS — BP 120/70 | HR 69 | Ht 69.0 in | Wt 175.8 lb

## 2021-11-09 DIAGNOSIS — I340 Nonrheumatic mitral (valve) insufficiency: Secondary | ICD-10-CM

## 2021-11-09 DIAGNOSIS — I10 Essential (primary) hypertension: Secondary | ICD-10-CM

## 2021-11-09 DIAGNOSIS — E782 Mixed hyperlipidemia: Secondary | ICD-10-CM | POA: Diagnosis not present

## 2021-11-09 DIAGNOSIS — I350 Nonrheumatic aortic (valve) stenosis: Secondary | ICD-10-CM | POA: Diagnosis not present

## 2021-11-09 NOTE — Progress Notes (Signed)
Cardiology Office Note:    Date:  11/09/2021   ID:  [name] [name], DOB 1936-03-20, MRN [id number]  PCP:  Ma Hillock, DO  Cardiologist:  Jenean Lindau, MD   Referring MD: Ma Hillock, DO    ASSESSMENT:    1. Essential hypertension   2. Moderate mitral regurgitation   3. Mild aortic stenosis   4. Mixed dyslipidemia    PLAN:    In order of problems listed above:  Atherosclerotic vascular disease: Secondary prevention stressed with the patient.  Importance of compliance with diet medication stressed and vocalized understanding. Essential hypertension: His blood pressure is low.  I suspect he may not need blood pressure medications.  I suspect that his orthostatic hypotension is causing his symptoms.  I have asked him to stop his losartan and keep a blood pressure log and let us know how he feels with his symptoms in a week or so he will get back to [place]. Mixed dyslipidemia: Diet was emphasized.  Lifestyle modification urged.  Lipids reviewed and he is going to do better with diet. Patient will be seen in follow-up appointment in 4 weeks or earlier if the patient has any concerns    Medication Adjustments/Labs and Tests Ordered: Current medicines are reviewed at length with the patient today.  Concerns regarding medicines are outlined above.  Orders Placed This Encounter  Procedures   EKG 12-Lead   No orders of the defined types were placed in this encounter.    No chief complaint on file.    History of Present Illness:    [name] [name] is a 86 y.o. male.  Patient has past medical history of aortic atherosclerosis carotid carotid atherosclerosis, essential hypertension, mixed dyslipidemia.  He denies any problems at this time and takes care of activities of daily living.  His only symptoms suggestive of postural hypotension.  When he exerts he feels a little dizzy.  He uses his exercise bicycle at home without any symptoms.  He tells me that he can bicycle  for 11 miles on his exercise machine at home without any symptoms.  Past Medical History:  Diagnosis Date   Abnormal ankle brachial index (ABI) 07/05/2019   ABI of the toe, at home health visit was abnormal for severe PAD-reading obtained left large toe which has callus formation.   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   BPH (benign prostatic hyperplasia) 04/23/2007   Qualifier: Diagnosis of  By: Paulina Fusi RN, Daine Gravel    Carotid atherosclerosis 01/21/2020   Chronic cough 07/05/2019   Chronic pain 04/29/2019   Degeneration of lumbar intervertebral disc 12/09/2019   Dyslipidemia 04/23/2007   Qualifier: Diagnosis of  By: Paulina Fusi RN, Daine Gravel    Essential hypertension 06/02/2008   Qualifier: Diagnosis of  By: Burnice Logan  MD, Doretha Sou    Hypothyroidism 05/13/2019   LAE (left atrial enlargement) 05/13/2019   Mild aortic stenosis 08/07/2021   Moderate mitral regurgitation 08/07/2021   Neuropathy    s/p trauma to left lower ext (horse accident)   Osteoarthritis 04/23/2007   Qualifier: Diagnosis of  By: Paulina Fusi RN, Daine Gravel    Palpitations 02/04/2008   Qualifier: Diagnosis of  By: Burnice Logan  MD, Doretha Sou    Sensorineural hearing loss (SNHL), bilateral 06/14/2016   TIA (transient ischemic attack) 05/13/2019   Tinnitus 02/28/2010   deaf left ear(no hearing)   Visual field defect 04/29/2019    Past Surgical  History:  Procedure Laterality Date   APPENDECTOMY     CATARACT EXTRACTION Bilateral 04/2020   COLONOSCOPY     HERNIA REPAIR     left foot surgery for fx  Jul 30, 2011   LITHOTRIPSY  1992   cystoscopy with stent placement also done(1 stone fragment remains)   right shoulder replacement  Sep 28, 2009   TOTAL KNEE ARTHROPLASTY Right 08/30/2013   Procedure: RIGHT TOTAL KNEE ARTHROPLASTY RIGHT ;  Surgeon: Gearlean Alf, MD;  Location: [facility];  Service: Orthopedics;  Laterality: Right;   TOTAL  SHOULDER ARTHROPLASTY Left 02/02/2015   Procedure: LEFT TOTAL SHOULDER ARTHROPLASTY;  Surgeon: Justice Britain, MD;  Location: [place];  Service: Orthopedics;  Laterality: Left;   TRANSURETHRAL RESECTION OF PROSTATE  03/27/2012   Procedure: TRANSURETHRAL RESECTION OF THE PROSTATE WITH GYRUS INSTRUMENTS;  Surgeon: Claybon Jabs, MD;  Location: [facility];  Service: Urology;;    Current Medications: Current Meds  Medication Sig   aspirin 81 MG chewable tablet Chew 81 mg by mouth daily.   celecoxib (CELEBREX) 200 MG capsule Take 1 capsule (200 mg total) by mouth daily.   Coenzyme Q10 (COQ10) 200 MG CAPS Take 200 mg by mouth at bedtime.   fexofenadine (ALLEGRA) 180 MG tablet Take 180 mg by mouth daily.   fluticasone (FLONASE) 50 MCG/ACT nasal spray Place 1 spray into both nostrils daily.   gabapentin (NEURONTIN) 600 MG tablet Take 1 tablet (600 mg total) by mouth 2 (two) times daily.   Krill Oil 1000 MG CAPS Take 2,000 mg by mouth daily with breakfast.   levothyroxine (SYNTHROID) 50 MCG tablet Take 1 tablet (50 mcg total) by mouth daily before breakfast. Avoid meal at least 30 minutes after taking medication.   Multiple Vitamin (MULTIVITAMIN) tablet Take 1 tablet by mouth daily.   potassium citrate (UROCIT-K) 10 MEQ (1080 MG) SR tablet Take 2 tablets (20 mEq total) by mouth 2 (two) times daily with a meal.   Psyllium (METAMUCIL FIBER PO) Take 2 capsules by mouth daily with breakfast.   simvastatin (ZOCOR) 40 MG tablet Take 1 tablet (40 mg total) by mouth daily at 6 PM.   vitamin C (ASCORBIC ACID) 500 MG tablet Take 500 mg by mouth at bedtime.   VITAMIN D PO Take 1 tablet by mouth daily.   [DISCONTINUED] losartan (COZAAR) 50 MG tablet Take 1 tablet (50 mg total) by mouth daily.     Allergies:   Patient has no known allergies.   Social History   Socioeconomic History   Marital status: Married    Spouse name: Not on file   Number of children: Not on file   Years of education: Not on file   Highest  education level: Not on file  Occupational History   Not on file  Tobacco Use   Smoking status: Never   Smokeless tobacco: Never  Vaping Use   Vaping Use: Never used  Substance and Sexual Activity   Alcohol use: No   Drug use: No   Sexual activity: Yes    Partners: Female  Other Topics Concern   Not on file  Social History Narrative   Marital status/children/pets: Married   Education/employment: retired   Investment banker, operational of Radio broadcast assistant Strain: Low Risk    Difficulty of Paying Living Expenses: Not hard at all  Food Insecurity: No Food Insecurity   Worried About Charity fundraiser in the Last Year: Never true   [employer] of  Food in the Last Year: Never true  Transportation Needs: No Transportation Needs   Lack of Transportation (Medical): No   Lack of Transportation (Non-Medical): No  Physical Activity: Sufficiently Active   Days of Exercise per Week: 5 days   Minutes of Exercise per Session: 40 min  Stress: No Stress Concern Present   Feeling of Stress : Not at all  Social Connections: Moderately Integrated   Frequency of Communication with Friends and Family: More than three times a week   Frequency of Social Gatherings with Friends and Family: More than three times a week   Attends Religious Services: More than 4 times per year   Active Member of [employer] or Organizations: No   Attends Archivist Meetings: Never   Marital Status: Married     Family History: The patient's family history includes COPD in his sister; Diabetes in his brother and sister; Heart disease (age of onset: 25) in his mother; Stroke (age of onset: 42) in his father.  ROS:   Please see the history of present illness.    All other systems reviewed and are negative.  EKGs/Labs/Other Studies Reviewed:    The following studies were reviewed today: I discussed my findings with the patient at length.  EKG reveals sinus rhythm poor anterior forces and nonspecific ST-T  changes   Recent Labs: 08/22/2021: ALT 17; BUN 37; Creatinine, Ser 0.86; Hemoglobin 13.5; Platelets 202; Potassium 4.6; Sodium 139; TSH 4.350  Recent Lipid Panel    Component Value Date/Time   CHOL 219 (H) 08/22/2021 0954   TRIG 69 08/22/2021 0954   HDL 72 08/22/2021 0954   CHOLHDL 3.0 08/22/2021 0954   CHOLHDL 2.2 04/30/2019 0459   VLDL 7 04/30/2019 0459   LDLCALC 135 (H) 08/22/2021 0954   LDLDIRECT 109.8 06/05/2011 0959    Physical Exam:    VS:  BP 120/70    Pulse 69    Ht 5\' 9"  (1.753 m)    Wt 175 lb 12.8 oz (79.7 kg)    SpO2 96%    BMI 25.96 kg/m     Wt Readings from Last 3 Encounters:  11/09/21 175 lb 12.8 oz (79.7 kg)  10/08/21 182 lb (82.6 kg)  09/04/21 179 lb (81.2 kg)     GEN: Patient is in no acute distress HEENT: Normal NECK: No JVD; No carotid bruits LYMPHATICS: No lymphadenopathy CARDIAC: Hear sounds regular, 2/6 systolic murmur at the apex. RESPIRATORY:  Clear to auscultation without rales, wheezing or rhonchi  ABDOMEN: Soft, non-tender, non-distended MUSCULOSKELETAL:  No edema; No deformity  SKIN: Warm and dry NEUROLOGIC:  Alert and oriented x 3 PSYCHIATRIC:  Normal affect   Signed, Jenean Lindau, MD  11/09/2021 1:07 PM    [place]

## 2021-11-09 NOTE — Patient Instructions (Signed)
Medication Instructions:  Your physician has recommended you make the following change in your medication:   Stop Losartan  *If you need a refill on your cardiac medications before your next appointment, please call your pharmacy*   Lab Work: None ordered If you have labs (blood work) drawn today and your tests are completely normal, you will receive your results only by: [place] (if you have MyChart) OR A paper copy in the mail If you have any lab test that is abnormal or we need to change your treatment, we will call you to review the results.   Testing/Procedures: None ordered   Follow-Up: At [facility], you and your health needs are our priority.  As part of our continuing mission to provide you with exceptional heart care, we have created designated Provider Care Teams.  These Care Teams include your primary Cardiologist (physician) and Advanced Practice Providers (APPs -  Physician Assistants and Nurse Practitioners) who all work together to provide you with the care you need, when you need it.  We recommend signing up for the patient portal called "MyChart".  Sign up information is provided on this After Visit Summary.  MyChart is used to connect with patients for Virtual Visits (Telemedicine).  Patients are able to view lab/test results, encounter notes, upcoming appointments, etc.  Non-urgent messages can be sent to your provider as well.   To learn more about what you can do with MyChart, go to [email or web address].    Your next appointment:   1 month(s)  The format for your next appointment:   In Person  Provider:   Jyl Heinz, MD   Other Instructions NA

## 2021-11-13 ENCOUNTER — Ambulatory Visit: Payer: PPO | Admitting: Cardiology

## 2021-11-26 ENCOUNTER — Other Ambulatory Visit: Payer: Self-pay | Admitting: Cardiology

## 2021-12-13 ENCOUNTER — Encounter: Payer: Self-pay | Admitting: Cardiology

## 2021-12-13 ENCOUNTER — Ambulatory Visit: Payer: PPO | Admitting: Cardiology

## 2021-12-13 VITALS — BP 136/50 | HR 66 | Ht 69.0 in | Wt 178.1 lb

## 2021-12-13 DIAGNOSIS — I350 Nonrheumatic aortic (valve) stenosis: Secondary | ICD-10-CM | POA: Diagnosis not present

## 2021-12-13 DIAGNOSIS — I6529 Occlusion and stenosis of unspecified carotid artery: Secondary | ICD-10-CM | POA: Diagnosis not present

## 2021-12-13 DIAGNOSIS — I10 Essential (primary) hypertension: Secondary | ICD-10-CM | POA: Diagnosis not present

## 2021-12-13 DIAGNOSIS — M159 Polyosteoarthritis, unspecified: Secondary | ICD-10-CM

## 2021-12-13 DIAGNOSIS — M150 Primary generalized (osteo)arthritis: Secondary | ICD-10-CM

## 2021-12-13 DIAGNOSIS — I340 Nonrheumatic mitral (valve) insufficiency: Secondary | ICD-10-CM | POA: Diagnosis not present

## 2021-12-13 DIAGNOSIS — E039 Hypothyroidism, unspecified: Secondary | ICD-10-CM

## 2021-12-13 MED ORDER — SILDENAFIL CITRATE 25 MG PO TABS
25.0000 mg | ORAL_TABLET | Freq: Every day | ORAL | 0 refills | Status: DC | PRN
Start: 2021-12-13 — End: 2022-03-25

## 2021-12-13 NOTE — Progress Notes (Signed)
?Cardiology Office Note:   ? ?Date:  12/13/2021  ? ?ID:  [name], DOB [birth year], MRN [id number] ? ?PCP:  Howard Pouch A, DO  ?Cardiologist:  Jenean Lindau, [name]  ? ?Referring [name]: Howard Pouch A, DO  ? ? ?ASSESSMENT:   ? ?1. Carotid atherosclerosis, unspecified laterality   ?2. Essential hypertension   ?3. Mild aortic stenosis   ?4. Moderate mitral regurgitation   ? ?PLAN:   ? ?In order of problems listed above: ? ?Carotid atherosclerosis: Atherosclerotic vascular disease: Secondary prevention stressed with the patient.  Importance of compliance with diet medication stressed any vocalized understanding.  He was advised to walk at least half an hour a day on a regular basis and he does do very meticulously. ?Mild aortic stenosis: Stable at this time and asymptomatic.  I discussed this with him. ?Moderate mitral regurgitation: Blood pressure is well optimized.  Continue cardiac medications and current management and we will continue to monitor. ?Mixed dyslipidemia on statin therapy: Diet emphasized.  He will be back in the next few days for fasting lipids. ?Patient will be seen in follow-up appointment in 6 months or earlier if the patient has any concerns ? ? ? ?Medication Adjustments/Labs and Tests Ordered: ?Current medicines are reviewed at length with the patient today.  Concerns regarding medicines are outlined above.  ?No orders of the defined types were placed in this encounter. ? ?No orders of the defined types were placed in this encounter. ? ? ? ?No chief complaint on file. ?  ? ?History of Present Illness:   ? ?[name] [name] is a 86 y.o. male.  Patient has past medical history of atherosclerotic vascular disease, carotid atherosclerosis, mild aortic stenosis and moderate mitral regurgitation.  He denies any problems at this time and takes care of activities of daily living.  No chest pain orthopnea or PND.  At the time of my evaluation, the patient is alert awake oriented and in no distress.   He exercises age appropriately and does well with this. ? ?Past Medical History:  ?Diagnosis Date  ? Abnormal ankle brachial index (ABI) 07/05/2019  ? ABI of the toe, at home health visit was abnormal for severe PAD-reading obtained left large toe which has callus formation.  ? Abnormal echocardiogram 05/13/2019  ? Aortic valve calcification 05/13/2019  ? Asymmetrical sensorineural hearing loss 06/14/2016  ? BPH (benign prostatic hyperplasia) 04/23/2007  ? Qualifier: Diagnosis of  By: [name] [name], [name] [name]   ? Carotid atherosclerosis 01/21/2020  ? Chronic cough 07/05/2019  ? Chronic pain 04/29/2019  ? Degeneration of lumbar intervertebral disc 12/09/2019  ? Dyslipidemia 04/23/2007  ? Qualifier: Diagnosis of  By: [name] [name], [name] [name]   ? Essential hypertension 06/02/2008  ? Qualifier: Diagnosis of  By: [name] Logan  [name], Doretha Sou   ? Hypothyroidism 05/13/2019  ? LAE (left atrial enlargement) 05/13/2019  ? Mild aortic stenosis 08/07/2021  ? Mixed dyslipidemia 08/07/2021  ? Moderate mitral regurgitation 08/07/2021  ? Neuropathy   ? s/p trauma to left lower ext (horse accident)  ? Osteoarthritis 04/23/2007  ? Qualifier: Diagnosis of  By: [name] [name], [name] [name]   ? Palpitations 02/04/2008  ? Qualifier: Diagnosis of  By: [name] Logan  [name], Doretha Sou   ? Sensorineural hearing loss (SNHL), bilateral 06/14/2016  ? TIA (transient ischemic attack) 05/13/2019  ? Tinnitus 02/28/2010  ? deaf left ear(no hearing)  ? Visual field defect 04/29/2019  ? ? ?Past Surgical History:  ?Procedure Laterality Date  ? APPENDECTOMY    ?  CATARACT EXTRACTION Bilateral 04/2020  ? COLONOSCOPY    ? HERNIA REPAIR    ? left foot surgery for fx  Jul 30, 2011  ? LITHOTRIPSY  1992  ? cystoscopy with stent placement also done(1 stone fragment remains)  ? right shoulder replacement  Sep 28, 2009  ? TOTAL KNEE ARTHROPLASTY Right 08/30/2013  ? Procedure: RIGHT TOTAL KNEE ARTHROPLASTY RIGHT ;  Surgeon: Gearlean Alf, [name];  Location: [facility];  Service: Orthopedics;   Laterality: Right;  ? TOTAL SHOULDER ARTHROPLASTY Left 02/02/2015  ? Procedure: LEFT TOTAL SHOULDER ARTHROPLASTY;  Surgeon: Justice Britain, [name];  Location: [place];  Service: Orthopedics;  Laterality: Left;  ? TRANSURETHRAL RESECTION OF PROSTATE  03/27/2012  ? Procedure: TRANSURETHRAL RESECTION OF THE PROSTATE WITH GYRUS INSTRUMENTS;  Surgeon: Claybon Jabs, [name];  Location: [facility];  Service: Urology;;  ? ? ?Current Medications: ?Current Meds  ?Medication Sig  ? aspirin 81 MG chewable tablet Chew 81 mg by mouth daily.  ? celecoxib (CELEBREX) 200 MG capsule Take 1 capsule (200 mg total) by mouth daily.  ? Coenzyme Q10 (COQ10) 200 MG CAPS Take 200 mg by mouth at bedtime.  ? fexofenadine (ALLEGRA) 180 MG tablet Take 180 mg by mouth daily.  ? fluticasone (FLONASE) 50 MCG/ACT nasal spray Place 1 spray into both nostrils daily.  ? gabapentin (NEURONTIN) 600 MG tablet Take 1 tablet (600 mg total) by mouth 2 (two) times daily.  ? Krill Oil 1000 MG CAPS Take 2,000 mg by mouth daily with breakfast.  ? levothyroxine (SYNTHROID) 50 MCG tablet Take 1 tablet (50 mcg total) by mouth daily before breakfast. Avoid meal at least 30 minutes after taking medication.  ? Multiple Vitamin (MULTIVITAMIN) tablet Take 1 tablet by mouth daily.  ? potassium citrate (UROCIT-K) 10 MEQ (1080 MG) SR tablet Take 2 tablets (20 mEq total) by mouth 2 (two) times daily with a meal.  ? Psyllium (METAMUCIL FIBER PO) Take 2 capsules by mouth daily with breakfast.  ? simvastatin (ZOCOR) 40 MG tablet Take 1 tablet (40 mg total) by mouth daily at 6 PM.  ? vitamin C (ASCORBIC ACID) 500 MG tablet Take 500 mg by mouth at bedtime.  ? VITAMIN D PO Take 1 tablet by mouth daily.  ?  ? ?Allergies:   Patient has no known allergies.  ? ?Social History  ? ?Socioeconomic History  ? Marital status: Married  ?  Spouse name: Not on file  ? Number of children: Not on file  ? Years of education: Not on file  ? Highest education level: Not on file  ?Occupational History  ? Not on  file  ?Tobacco Use  ? Smoking status: Never  ? Smokeless tobacco: Never  ?Vaping Use  ? Vaping Use: Never used  ?Substance and Sexual Activity  ? Alcohol use: No  ? Drug use: No  ? Sexual activity: Yes  ?  Partners: Female  ?Other Topics Concern  ? Not on file  ?Social History Narrative  ? Marital status/children/pets: Married  ? Education/employment: retired  ? ?Social Determinants of Health  ? ?Financial Resource Strain: Low Risk   ? Difficulty of Paying Living Expenses: Not hard at all  ?Food Insecurity: No Food Insecurity  ? Worried About Charity fundraiser in the Last Year: Never true  ? Ran Out of Food in the Last Year: Never true  ?Transportation Needs: No Transportation Needs  ? Lack of Transportation (Medical): No  ? Lack of Transportation (Non-Medical): No  ?Physical  Activity: Sufficiently Active  ? Days of Exercise per Week: 5 days  ? Minutes of Exercise per Session: 40 min  ?Stress: No Stress Concern Present  ? Feeling of Stress : Not at all  ?Social Connections: Moderately Integrated  ? Frequency of Communication with Friends and Family: More than three times a week  ? Frequency of Social Gatherings with Friends and Family: More than three times a week  ? Attends Religious Services: More than 4 times per year  ? Active Member of Clubs or Organizations: No  ? Attends Archivist Meetings: Never  ? Marital Status: Married  ?  ? ?Family History: ?The patient's family history includes COPD in his sister; Diabetes in his brother and sister; Heart disease (age of onset: 33) in his mother; Stroke (age of onset: 25) in his father. ? ?ROS:   ?Please see the history of present illness.    ?All other systems reviewed and are negative. ? ?EKGs/Labs/Other Studies Reviewed:   ? ?The following studies were reviewed today: ?IMPRESSIONS  ? ? ? 1. Left ventricular ejection fraction, by estimation, is 60 to 65%. The  ?left ventricle has normal function. The left ventricle has no regional  ?wall motion  abnormalities. Left ventricular diastolic parameters are  ?consistent with Grade I diastolic  ?dysfunction (impaired relaxation).  ? 2. Right ventricular systolic function is normal. The right ventricular  ?size is no

## 2021-12-13 NOTE — Patient Instructions (Signed)
Medication Instructions:  ?Your physician has recommended you make the following change in your medication:  ? ?Take Viagra as needed. ? ? ?*If you need a refill on your cardiac medications before your next appointment, please call your pharmacy* ? ? ?Lab Work: ?Your physician recommends that you return for lab work in: the next few days. ?You need to have labs done when you are fasting.  You can come Monday through Friday 8:30 am to 12:00 pm and 1:15 to 4:30. You do not need to make an appointment as the order has already been placed. The labs you are going to have done are BMET, CBC, TSH, vitamin D, hgb A1C, LFT and Lipids. ? ?The lab is on the 2nd floor of [place]. ? ?If you have labs (blood work) drawn today and your tests are completely normal, you will receive your results only by: ?MyChart Message (if you have MyChart) OR ?A paper copy in the mail ?If you have any lab test that is abnormal or we need to change your treatment, we will call you to review the results. ? ? ?Testing/Procedures: ?None ordered ? ? ?Follow-Up: ?At [facility], you and your health needs are our priority.  As part of our continuing mission to provide you with exceptional heart care, we have created designated Provider Care Teams.  These Care Teams include your primary Cardiologist (physician) and Advanced Practice Providers (APPs -  Physician Assistants and Nurse Practitioners) who all work together to provide you with the care you need, when you need it. ? ?We recommend signing up for the patient portal called "MyChart".  Sign up information is provided on this After Visit Summary.  MyChart is used to connect with patients for Virtual Visits (Telemedicine).  Patients are able to view lab/test results, encounter notes, upcoming appointments, etc.  Non-urgent messages can be sent to your provider as well.   ?To learn more about what you can do with MyChart, go to [email or web address].   ? ?Your next appointment:   ?9  month(s) ? ?The format for your next appointment:   ?In Person ? ?Provider:   ?Jyl Heinz, MD ? ? ?Other Instructions ?NA ? ?

## 2021-12-15 LAB — BASIC METABOLIC PANEL
BUN/Creatinine Ratio: 40 — ABNORMAL HIGH (ref 10–24)
BUN: 35 mg/dL — ABNORMAL HIGH (ref 8–27)
CO2: 24 mmol/L (ref 20–29)
Calcium: 9.4 mg/dL (ref 8.6–10.2)
Chloride: 105 mmol/L (ref 96–106)
Creatinine, Ser: 0.87 mg/dL (ref 0.76–1.27)
Glucose: 97 mg/dL (ref 70–99)
Potassium: 4.8 mmol/L (ref 3.5–5.2)
Sodium: 144 mmol/L (ref 134–144)
eGFR: 86 mL/min/{1.73_m2} (ref >59)

## 2021-12-15 LAB — CBC WITH DIFFERENTIAL/PLATELET
Basophils Absolute: 0.1 10*3/uL (ref 0.0–0.2)
Basos: 1 %
EOS (ABSOLUTE): 0.3 10*3/uL (ref 0.0–0.4)
Eos: 4 %
Hematocrit: 39.3 % (ref 37.5–51.0)
Hemoglobin: 13.2 g/dL (ref 13.0–17.7)
Immature Grans (Abs): 0 10*3/uL (ref 0.0–0.1)
Immature Granulocytes: 0 %
Lymphocytes Absolute: 1.4 10*3/uL (ref 0.7–3.1)
Lymphs: 22 %
MCH: 31.4 pg (ref 26.6–33.0)
MCHC: 33.6 g/dL (ref 31.5–35.7)
MCV: 94 fL (ref 79–97)
Monocytes Absolute: 0.5 10*3/uL (ref 0.1–0.9)
Monocytes: 8 %
Neutrophils Absolute: 4.2 10*3/uL (ref 1.4–7.0)
Neutrophils: 65 %
Platelets: 185 10*3/uL (ref 150–450)
RBC: 4.2 x10E6/uL (ref 4.14–5.80)
RDW: 12.7 % (ref 11.6–15.4)
WBC: 6.4 10*3/uL (ref 3.4–10.8)

## 2021-12-15 LAB — LIPID PANEL
Chol/HDL Ratio: 2.1 ratio (ref 0.0–5.0)
Cholesterol, Total: 143 mg/dL (ref 100–199)
HDL: 67 mg/dL (ref >39)
LDL Chol Calc (NIH): 65 mg/dL (ref 0–99)
Triglycerides: 51 mg/dL (ref 0–149)
VLDL Cholesterol Cal: 11 mg/dL (ref 5–40)

## 2021-12-15 LAB — HEPATIC FUNCTION PANEL
ALT: 17 IU/L (ref 0–44)
AST: 21 IU/L (ref 0–40)
Albumin: 4.8 g/dL — ABNORMAL HIGH (ref 3.6–4.6)
Alkaline Phosphatase: 75 IU/L (ref 44–121)
Bilirubin Total: 0.7 mg/dL (ref 0.0–1.2)
Bilirubin, Direct: 0.19 mg/dL (ref 0.00–0.40)
Total Protein: 6.5 g/dL (ref 6.0–8.5)

## 2021-12-15 LAB — HEMOGLOBIN A1C
Est. average glucose Bld gHb Est-mCnc: 105 mg/dL
Hgb A1c MFr Bld: 5.3 % (ref 4.8–5.6)

## 2021-12-15 LAB — VITAMIN D 25 HYDROXY (VIT D DEFICIENCY, FRACTURES): Vit D, 25-Hydroxy: 101 ng/mL — ABNORMAL HIGH (ref 30.0–100.0)

## 2021-12-15 LAB — TSH: TSH: 4.05 u[IU]/mL (ref 0.450–4.500)

## 2021-12-18 ENCOUNTER — Telehealth: Payer: Self-pay

## 2021-12-18 NOTE — Telephone Encounter (Signed)
Left VM to call back 

## 2021-12-18 NOTE — Telephone Encounter (Signed)
-----   Message from Jenean Lindau, MD sent at 12/18/2021  1:26 PM EDT ----- ?The results of the study is unremarkable. Please inform patient. I will discuss in detail at next appointment. Cc  primary care/referring physician ?Jenean Lindau, MD 12/18/2021 1:26 PM  ?

## 2021-12-27 ENCOUNTER — Ambulatory Visit: Payer: PPO | Admitting: Cardiology

## 2022-03-25 ENCOUNTER — Encounter: Payer: Self-pay | Admitting: Family Medicine

## 2022-03-25 ENCOUNTER — Ambulatory Visit (INDEPENDENT_AMBULATORY_CARE_PROVIDER_SITE_OTHER): Payer: PPO | Admitting: Family Medicine

## 2022-03-25 VITALS — BP 134/68 | HR 59 | Temp 98.2°F | Ht 69.0 in | Wt 178.0 lb

## 2022-03-25 DIAGNOSIS — I10 Essential (primary) hypertension: Secondary | ICD-10-CM

## 2022-03-25 DIAGNOSIS — M5136 Other intervertebral disc degeneration, lumbar region: Secondary | ICD-10-CM

## 2022-03-25 DIAGNOSIS — G629 Polyneuropathy, unspecified: Secondary | ICD-10-CM

## 2022-03-25 DIAGNOSIS — N400 Enlarged prostate without lower urinary tract symptoms: Secondary | ICD-10-CM | POA: Diagnosis not present

## 2022-03-25 DIAGNOSIS — E785 Hyperlipidemia, unspecified: Secondary | ICD-10-CM

## 2022-03-25 DIAGNOSIS — R7989 Other specified abnormal findings of blood chemistry: Secondary | ICD-10-CM

## 2022-03-25 DIAGNOSIS — M159 Polyosteoarthritis, unspecified: Secondary | ICD-10-CM

## 2022-03-25 DIAGNOSIS — E034 Atrophy of thyroid (acquired): Secondary | ICD-10-CM

## 2022-03-25 DIAGNOSIS — G459 Transient cerebral ischemic attack, unspecified: Secondary | ICD-10-CM

## 2022-03-25 DIAGNOSIS — E673 Hypervitaminosis D: Secondary | ICD-10-CM

## 2022-03-25 MED ORDER — SIMVASTATIN 40 MG PO TABS: 40.0000 mg | ORAL_TABLET | Freq: Every day | ORAL | 3 refills | Status: DC

## 2022-03-25 MED ORDER — CELECOXIB 200 MG PO CAPS: 200.0000 mg | ORAL_CAPSULE | Freq: Every day | ORAL | 1 refills | Status: DC

## 2022-03-25 MED ORDER — POTASSIUM CITRATE ER 10 MEQ (1080 MG) PO TBCR
20.0000 meq | EXTENDED_RELEASE_TABLET | Freq: Two times a day (BID) | ORAL | 1 refills | Status: DC
Start: 2022-03-25 — End: 2022-09-05

## 2022-03-25 MED ORDER — GABAPENTIN 600 MG PO TABS: 600.0000 mg | ORAL_TABLET | Freq: Two times a day (BID) | ORAL | 1 refills | Status: DC

## 2022-03-25 NOTE — Progress Notes (Unsigned)
SUBJECTIVE  Patient Care Team    Relationship Specialty Notifications Start End  Ma Hillock, DO PCP - General Family Medicine  01/19/19   [name], MD Consulting Physician Dermatology  01/19/20   Revankar, Reita Cliche, MD Consulting Physician Cardiology  01/19/20   Manson Passey, Emerge  Specialist  01/19/20   Odette Fraction  Optometry  01/19/20     Chief Complaint  Patient presents with   Hypertension    [facility]; pt is not fasting    HPI: [name] [name] is a 86 y.o. male present for chronic medical condition follow-up  Hypertension/hyperlipidemia: Pt reports compliance with losartan 50, Zocor 40 mg nightly. Patient denies chest pain, shortness of breath, dizziness or lower extremity edema.  Pt takes a daily baby ASA. Pt is  prescribed statin. Labs up-to-date. 08/2021 Diet: Monitors his diet Exercise: Routine exercise RF: Hypertension, hyperlipidemia, family history of heart disease and stroke  BPH/arthritis/neuropathy: Patient has a history of arthritis status post shoulder replacement and knee arthroplasty.  He also has a history of BPH/s/p TURP.  He states he takes Celebrex 200 mg daily helps with his arthritis and bph. He did try the Flomax and it was not helpful.  He sustained a lower extremity fracture injury in which he has chronic neuropathy.  Celebrex 200 mg daily and gabapentin 200 mg twice daily is very helpful for pain and to keep active.   ROS: See pertinent positives and negatives per HPI.  Patient Active Problem List   Diagnosis Date Noted   Mixed dyslipidemia 08/07/2021   Mild aortic stenosis 08/07/2021   Moderate mitral regurgitation 08/07/2021   Carotid atherosclerosis 01/21/2020   Degeneration of lumbar intervertebral disc 12/09/2019   Abnormal ankle brachial index (ABI) 07/05/2019   Chronic cough 07/05/2019   LAE (left atrial enlargement) 05/13/2019   Aortic valve calcification 05/13/2019   TIA (transient ischemic attack) 05/13/2019   Hypothyroidism  05/13/2019   Abnormal echocardiogram 05/13/2019   Chronic pain 04/29/2019   Visual field defect 04/29/2019   Neuropathy- chronic s/p trauma 01/20/2019   Asymmetrical sensorineural hearing loss 06/14/2016   Sensorineural hearing loss (SNHL), bilateral 06/14/2016   Tinnitus 02/28/2010   Essential hypertension 06/02/2008   Palpitations 02/04/2008   Dyslipidemia 04/23/2007   BPH (benign prostatic hyperplasia) 04/23/2007    Class: Present on Admission   Osteoarthritis 04/23/2007    Social History   Tobacco Use   Smoking status: Never   Smokeless tobacco: Never  Substance Use Topics   Alcohol use: No    Current Outpatient Medications:    aspirin 81 MG chewable tablet, Chew 81 mg by mouth daily., Disp: , Rfl:    celecoxib (CELEBREX) 200 MG capsule, Take 1 capsule (200 mg total) by mouth daily., Disp: 90 capsule, Rfl: 1   Coenzyme Q10 (COQ10) 200 MG CAPS, Take 200 mg by mouth at bedtime., Disp: , Rfl:    fexofenadine (ALLEGRA) 180 MG tablet, Take 180 mg by mouth daily., Disp: , Rfl:    fluticasone (FLONASE) 50 MCG/ACT nasal spray, Place 1 spray into both nostrils daily., Disp: , Rfl:    gabapentin (NEURONTIN) 600 MG tablet, Take 1 tablet (600 mg total) by mouth 2 (two) times daily., Disp: 180 tablet, Rfl: 1   Krill Oil 1000 MG CAPS, Take 2,000 mg by mouth daily with breakfast., Disp: , Rfl:    levothyroxine (SYNTHROID) 50 MCG tablet, Take 1 tablet (50 mcg total) by mouth daily before breakfast. Avoid meal at least 30 minutes after taking  medication., Disp: 90 tablet, Rfl: 3   Multiple Vitamin (MULTIVITAMIN) tablet, Take 1 tablet by mouth daily., Disp: , Rfl:    potassium citrate (UROCIT-K) 10 MEQ (1080 MG) SR tablet, Take 2 tablets (20 mEq total) by mouth 2 (two) times daily with a meal., Disp: 360 tablet, Rfl: 1   Psyllium (METAMUCIL FIBER PO), Take 2 capsules by mouth daily with breakfast., Disp: , Rfl:    simvastatin (ZOCOR) 40 MG tablet, Take 1 tablet (40 mg total) by mouth daily at 6  PM., Disp: 90 tablet, Rfl: 3   vitamin C (ASCORBIC ACID) 500 MG tablet, Take 500 mg by mouth at bedtime., Disp: , Rfl:    VITAMIN D PO, Take 1 tablet by mouth daily., Disp: , Rfl:    sildenafil (VIAGRA) 25 MG tablet, Take 1 tablet (25 mg total) by mouth daily as needed for erectile dysfunction. (Patient not taking: Reported on 03/25/2022), Disp: 10 tablet, Rfl: 0  No Known Allergies  OBJECTIVE: BP 134/68   Pulse (!) 59   Temp 98.2 F (36.8 C) (Oral)   Ht '5\' 9"'$  (1.753 m)   Wt 178 lb (80.7 kg)   SpO2 97%   BMI 26.29 kg/m  Physical Exam Vitals and nursing note reviewed.  Constitutional:      General: He is not in acute distress.    Appearance: Normal appearance. He is not ill-appearing, toxic-appearing or diaphoretic.  HENT:     Head: Normocephalic and atraumatic.     Mouth/Throat:     Mouth: Mucous membranes are moist.  Eyes:     General: No scleral icterus.       Right eye: No discharge.        Left eye: No discharge.     Extraocular Movements: Extraocular movements intact.     Pupils: Pupils are equal, round, and reactive to light.  Cardiovascular:     Rate and Rhythm: Normal rate and regular rhythm.  Pulmonary:     Effort: Pulmonary effort is normal. No respiratory distress.     Breath sounds: Normal breath sounds. No wheezing, rhonchi or rales.  Musculoskeletal:     Cervical back: Neck supple.     Right lower leg: No edema.     Left lower leg: No edema.  Lymphadenopathy:     Cervical: No cervical adenopathy.  Skin:    General: Skin is warm and dry.     Coloration: Skin is not jaundiced or pale.     Findings: No rash.  Neurological:     Mental Status: He is alert and oriented to person, place, and time. Mental status is at baseline.  Psychiatric:        Mood and Affect: Mood normal.        Behavior: Behavior normal.        Thought Content: Thought content normal.        Judgment: Judgment normal.      ASSESSMENT AND PLAN: [name] [name] is a 86 y.o. male  present for  Benign prostatic hyperplasia without lower urinary tract symptoms/nephrolithiasis/elevated PSA Stable Continue celebrex. - flomax tried and reported not helpful.  - continue potassium citrate Has been referred to urology  Dyslipidemia/Essential hypertension/Overweight (BMI 25.0-29.9) Stable *** Continue losartan-prescribed by cardiology Continue Zocor 40 mg daily> [facility] - continue baby aspirin - low sodium diet and routine exercise.  Follow-up 5.5 months, sooner if blood pressure above goal.  Neuropathy- chronic s/p trauma -stable.  status post trauma to lower extremity. Continue gabapentin 600  mg twice daily  Primary osteoarthritis involving multiple joints Stable Continue Celebrex  Hypothyroidism: ***levo to 50 mcg. Still fatigued, tsh 4.3.  TSH collected today  Abnormal ABI: Discussed signs and symptoms for him to monitor for decreased circulation.  At this time he would like to wait on any further testing.   Follow-up in 5.5 months provider appointment on chronic medical conditions   No orders of the defined types were placed in this encounter.   No orders of the defined types were placed in this encounter.   Referral Orders  No referral(s) requested today      Howard Pouch, DO 03/25/2022

## 2022-03-25 NOTE — Patient Instructions (Addendum)
Stop vit D for at least 6 mos.  Watch fluid in legs. If this worsens, will need to consider diuretic.  Low sodium diet/low salt and keep legs elevated when able

## 2022-03-26 ENCOUNTER — Other Ambulatory Visit: Payer: Self-pay | Admitting: Family Medicine

## 2022-03-26 LAB — BASIC METABOLIC PANEL
BUN/Creatinine Ratio: 36 (calc) — ABNORMAL HIGH (ref 6–22)
BUN: 27 mg/dL — ABNORMAL HIGH (ref 7–25)
CO2: 27 mmol/L (ref 20–32)
Calcium: 9.6 mg/dL (ref 8.6–10.3)
Chloride: 105 mmol/L (ref 98–110)
Creat: 0.76 mg/dL (ref 0.70–1.22)
Glucose, Bld: 92 mg/dL (ref 65–99)
Potassium: 4.5 mmol/L (ref 3.5–5.3)
Sodium: 141 mmol/L (ref 135–146)

## 2022-03-26 LAB — EXTRA SPECIMEN

## 2022-03-26 LAB — PTH, INTACT AND CALCIUM
Calcium: 9.6 mg/dL (ref 8.6–10.3)
PTH: 46 pg/mL (ref 16–77)

## 2022-03-26 LAB — VITAMIN D 25 HYDROXY (VIT D DEFICIENCY, FRACTURES): Vit D, 25-Hydroxy: 71 ng/mL (ref 30–100)

## 2022-03-26 LAB — TSH: TSH: 2.78 mIU/L (ref 0.40–4.50)

## 2022-03-26 MED ORDER — LEVOTHYROXINE SODIUM 50 MCG PO TABS: 50.0000 ug | ORAL_TABLET | Freq: Every day | ORAL | 3 refills | Status: DC

## 2022-04-17 ENCOUNTER — Encounter: Payer: Self-pay | Admitting: Family Medicine

## 2022-07-02 ENCOUNTER — Encounter: Payer: Self-pay | Admitting: Family Medicine

## 2022-07-02 ENCOUNTER — Ambulatory Visit (INDEPENDENT_AMBULATORY_CARE_PROVIDER_SITE_OTHER): Payer: PPO | Admitting: Family Medicine

## 2022-07-02 VITALS — BP 150/68 | HR 59 | Temp 98.1°F | Wt 180.0 lb

## 2022-07-02 DIAGNOSIS — Z9079 Acquired absence of other genital organ(s): Secondary | ICD-10-CM

## 2022-07-02 DIAGNOSIS — N400 Enlarged prostate without lower urinary tract symptoms: Secondary | ICD-10-CM | POA: Diagnosis not present

## 2022-07-02 DIAGNOSIS — Z9889 Other specified postprocedural states: Secondary | ICD-10-CM | POA: Diagnosis not present

## 2022-07-02 DIAGNOSIS — R030 Elevated blood-pressure reading, without diagnosis of hypertension: Secondary | ICD-10-CM

## 2022-07-02 DIAGNOSIS — E673 Hypervitaminosis D: Secondary | ICD-10-CM

## 2022-07-02 HISTORY — DX: Hypervitaminosis D: E67.3

## 2022-07-02 NOTE — Progress Notes (Signed)
[name] [name] , 1936/01/02, 86 y.o., male MRN: [id number] Patient Care Team    Relationship Specialty Notifications Start End  Ma Hillock, DO PCP - General Family Medicine  01/19/19   [name], MD Consulting Physician Dermatology  01/19/20   Revankar, Reita Cliche, MD Consulting Physician Cardiology  01/19/20   Manson Passey, Emerge  Specialist  01/19/20   Odette Fraction  Optometry  01/19/20     Chief Complaint  Patient presents with   vit d recheck     Subjective: Pt presents for an OV follow-up on his abnormal vitamin D levels.  He had been taken high doses of vitamin D during COVID 19 and was found to have levels of vitamin D greater than 100.  Recheck last visit 3 months ago and vitamin D levels were still at 71.  He was encouraged to refrain from using any vitamin D supplement for additional 3 months and follow-up.     08/29/2021    2:18 PM 03/20/2021    9:37 AM 08/23/2020    2:41 PM 01/19/2019    2:24 PM 04/09/2018    8:39 AM  Depression screen PHQ 2/9  Decreased Interest 0 0 0 0 0  Down, Depressed, Hopeless 1 0 0 0 0  PHQ - 2 Score 1 0 0 0 0    No Known Allergies Social History   Social History Narrative   Marital status/children/pets: Married   Education/employment: retired   Past Medical History:  Diagnosis Date   Abnormal ankle brachial index (ABI) 07/05/2019   ABI of the toe, at home health visit was abnormal for severe PAD-reading obtained left large toe which has callus formation.   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   BPH (benign prostatic hyperplasia) 04/23/2007   Qualifier: Diagnosis of  By: Paulina Fusi RN, Daine Gravel    Carotid atherosclerosis 01/21/2020   Chronic cough 07/05/2019   Chronic pain 04/29/2019   Degeneration of lumbar intervertebral disc 12/09/2019   Dyslipidemia 04/23/2007   Qualifier: Diagnosis of  By: Paulina Fusi RN, Daine Gravel    Essential  hypertension 06/02/2008   Qualifier: Diagnosis of  By: Burnice Logan  MD, Doretha Sou    Hypothyroidism 05/13/2019   LAE (left atrial enlargement) 05/13/2019   Mild aortic stenosis 08/07/2021   Mixed dyslipidemia 08/07/2021   Moderate mitral regurgitation 08/07/2021   Neuropathy    s/p trauma to left lower ext (horse accident)   Osteoarthritis 04/23/2007   Qualifier: Diagnosis of  By: Paulina Fusi RN, Daine Gravel    Palpitations 02/04/2008   Qualifier: Diagnosis of  By: Burnice Logan  MD, Doretha Sou    Sensorineural hearing loss (SNHL), bilateral 06/14/2016   TIA (transient ischemic attack) 05/13/2019   Tinnitus 02/28/2010   deaf left ear(no hearing)   Visual field defect 04/29/2019   Past Surgical History:  Procedure Laterality Date   APPENDECTOMY     CATARACT EXTRACTION Bilateral 04/2020   COLONOSCOPY     HERNIA REPAIR     left foot surgery for fx  Jul 30, 2011   LITHOTRIPSY  1992   cystoscopy with stent placement also done(1 stone fragment remains)   right shoulder replacement  Sep 28, 2009   TOTAL KNEE ARTHROPLASTY Right 08/30/2013   Procedure: RIGHT TOTAL KNEE ARTHROPLASTY RIGHT ;  Surgeon: Gearlean Alf, MD;  Location: [facility];  Service: Orthopedics;  Laterality: Right;   TOTAL SHOULDER ARTHROPLASTY Left 02/02/2015  Procedure: LEFT TOTAL SHOULDER ARTHROPLASTY;  Surgeon: Justice Britain, MD;  Location: [facility];  Service: Orthopedics;  Laterality: Left;   TRANSURETHRAL RESECTION OF PROSTATE  03/27/2012   Procedure: TRANSURETHRAL RESECTION OF THE PROSTATE WITH GYRUS INSTRUMENTS;  Surgeon: Claybon Jabs, MD;  Location: [facility];  Service: Urology;;   Family History  Problem Relation Age of Onset   Heart disease Mother 64       MI   Stroke Father 33   COPD Sister    Diabetes Sister    Diabetes Brother    Allergies as of 07/02/2022   No Known Allergies      Medication List        Accurate as of July 02, 2022 11:59 PM. If you have any questions, ask your nurse or doctor.           ascorbic acid 500 MG tablet Commonly known as: VITAMIN C Take 500 mg by mouth at bedtime.   aspirin 81 MG chewable tablet Chew 81 mg by mouth daily.   celecoxib 200 MG capsule Commonly known as: CELEBREX Take 1 capsule (200 mg total) by mouth daily.   CoQ10 200 MG Caps Take 200 mg by mouth at bedtime.   fexofenadine 180 MG tablet Commonly known as: ALLEGRA Take 180 mg by mouth daily.   fluticasone 50 MCG/ACT nasal spray Commonly known as: FLONASE Place 1 spray into both nostrils daily.   gabapentin 600 MG tablet Commonly known as: NEURONTIN Take 1 tablet (600 mg total) by mouth 2 (two) times daily.   Krill Oil 1000 MG Caps Take 2,000 mg by mouth daily with breakfast.   levothyroxine 50 MCG tablet Commonly known as: SYNTHROID Take 1 tablet (50 mcg total) by mouth daily before breakfast. Avoid meal at least 30 minutes after taking medication.   METAMUCIL FIBER PO Take 2 capsules by mouth daily with breakfast.   multivitamin tablet Take 1 tablet by mouth daily.   potassium citrate 10 MEQ (1080 MG) SR tablet Commonly known as: UROCIT-K Take 2 tablets (20 mEq total) by mouth 2 (two) times daily with a meal.   simvastatin 40 MG tablet Commonly known as: ZOCOR Take 1 tablet (40 mg total) by mouth daily at 6 PM.        All past medical history, surgical history, allergies, family history, immunizations andmedications were updated in the EMR today and reviewed under the history and medication portions of their EMR.     ROS Negative, with the exception of above mentioned in HPI   Objective:  BP (!) 150/68   Pulse (!) 59   Temp 98.1 F (36.7 C)   Wt 180 lb (81.6 kg)   SpO2 99%   BMI 26.58 kg/m  Body mass index is 26.58 kg/m. Physical Exam Vitals and nursing note reviewed.  Constitutional:      General: He is not in acute distress.    Appearance: Normal appearance. He is not ill-appearing, toxic-appearing or diaphoretic.  HENT:     Head: Normocephalic  and atraumatic.  Eyes:     General: No scleral icterus.       Right eye: No discharge.        Left eye: No discharge.     Extraocular Movements: Extraocular movements intact.     Pupils: Pupils are equal, round, and reactive to light.  Skin:    General: Skin is warm and dry.     Coloration: Skin is not jaundiced or pale.  Findings: No rash.  Neurological:     Mental Status: He is alert and oriented to person, place, and time. Mental status is at baseline.  Psychiatric:        Mood and Affect: Mood normal.        Behavior: Behavior normal.        Thought Content: Thought content normal.        Judgment: Judgment normal.     No results found. No results found. No results found for this or any previous visit (from the past 24 hour(s)).  Assessment/Plan: [name] [name] is a 86 y.o. male present for OV for  Hypervitaminosis D We will recheck levels today for his vitamin D.  If vitamin D is in normal range, he can add back vitamin D3 1000 units daily with food. - VITAMIN D 25 Hydroxy (Vit-D Deficiency, Fractures)  Discussed need for influenza shot-patient states he is getting his vaccine at his pharmacy today.  Elevated blood pressures: Patient had a prior history of hypertension and was prescribed blood pressure meds.  However he has been able to keep his blood pressure down without medications.  Pressures today are elevated above his usual. We will continue to monitor. Encouraged him to monitor at home and report if blood pressures remain above 130/80 routinely.   Reviewed expectations re: course of current medical issues. Discussed self-management of symptoms. Outlined signs and symptoms indicating need for more acute intervention. Patient verbalized understanding and all questions were answered. Patient received an After-Visit Summary.    Orders Placed This Encounter  Procedures   VITAMIN D 25 Hydroxy (Vit-D Deficiency, Fractures)   Ambulatory referral to Urology    No orders of the defined types were placed in this encounter.  Referral Orders         Ambulatory referral to Urology       Note is dictated utilizing voice recognition software. Although note has been proof read prior to signing, occasional typographical errors still can be missed. If any questions arise, please do not hesitate to call for verification.   electronically signed by:  Howard Pouch, DO  [place]

## 2022-07-02 NOTE — Patient Instructions (Addendum)
Return if symptoms worsen or fail to improve.        Great to see you today.  I have refilled the medication(s) we provide.   If labs were collected, we will inform you of lab results once received either by echart message or telephone call.   - echart message- for normal results that have been seen by the patient already.   - telephone call: abnormal results or if patient has not viewed results in their echart.  

## 2022-07-03 LAB — VITAMIN D 25 HYDROXY (VIT D DEFICIENCY, FRACTURES): Vit D, 25-Hydroxy: 55 ng/mL (ref 30–100)

## 2022-07-03 NOTE — Addendum Note (Signed)
Addended by: Howard Pouch A on: 07/03/2022 05:33 PM   Modules accepted: Orders

## 2022-07-31 LAB — HM AWV

## 2022-09-04 ENCOUNTER — Ambulatory Visit (INDEPENDENT_AMBULATORY_CARE_PROVIDER_SITE_OTHER): Payer: PPO

## 2022-09-04 DIAGNOSIS — Z0000 Encounter for general adult medical examination without abnormal findings: Secondary | ICD-10-CM | POA: Diagnosis not present

## 2022-09-04 NOTE — Progress Notes (Signed)
Subjective:   [name] [name] is a 86 y.o. male who presents for Medicare Annual/Subsequent preventive examination.  I connected with  LUPE BONNER on 09/04/22 by an audio only telemedicine application and verified that I am speaking with the correct person using two identifiers.   I discussed the limitations, risks, security and privacy concerns of performing an evaluation and management service by telephone and the availability of in person appointments. I also discussed with the patient that there may be a patient responsible charge related to this service. The patient expressed understanding and verbally consented to this telephonic visit.  Location of Patient: home Location of Provider: office  List any persons and their role that are participating in the visit with the patient.    Review of Systems    Defer to PCP Cardiac Risk Factors include: advanced age (>25mn, >>15women);male gender;dyslipidemia;hypertension     Objective:    There were no vitals filed for this visit. There is no height or weight on file to calculate BMI.     08/29/2021    2:19 PM 08/23/2020    2:39 PM 10/12/2019   12:15 PM 04/29/2019    5:19 PM 03/12/2017    2:24 AM 09/23/2016    9:58 AM 02/12/2016    5:14 PM  Advanced Directives  Does Patient Have a Medical Advance Directive? Yes Yes Yes No No Yes No  Type of AParamedicof [place] will [place] will [place] will      Copy of [place] Chart? No - copy requested Yes - validated most recent copy scanned in chart (See row information)       Would patient like information on creating a medical advance directive?       No - patient declined information    Current Medications (verified) Outpatient Encounter Medications as of 09/04/2022  Medication Sig   aspirin 81 MG chewable tablet Chew 81 mg by mouth daily.   celecoxib (CELEBREX) 200 MG  capsule Take 1 capsule (200 mg total) by mouth daily.   Coenzyme Q10 (COQ10) 200 MG CAPS Take 200 mg by mouth at bedtime.   fexofenadine (ALLEGRA) 180 MG tablet Take 180 mg by mouth daily.   fluticasone (FLONASE) 50 MCG/ACT nasal spray Place 1 spray into both nostrils daily.   gabapentin (NEURONTIN) 600 MG tablet Take 1 tablet (600 mg total) by mouth 2 (two) times daily.   Krill Oil 1000 MG CAPS Take 2,000 mg by mouth daily with breakfast.   levothyroxine (SYNTHROID) 50 MCG tablet Take 1 tablet (50 mcg total) by mouth daily before breakfast. Avoid meal at least 30 minutes after taking medication.   Multiple Vitamin (MULTIVITAMIN) tablet Take 1 tablet by mouth daily.   potassium citrate (UROCIT-K) 10 MEQ (1080 MG) SR tablet Take 2 tablets (20 mEq total) by mouth 2 (two) times daily with a meal.   Psyllium (METAMUCIL FIBER PO) Take 2 capsules by mouth daily with breakfast.   simvastatin (ZOCOR) 40 MG tablet Take 1 tablet (40 mg total) by mouth daily at 6 PM.   vitamin C (ASCORBIC ACID) 500 MG tablet Take 500 mg by mouth at bedtime.   No facility-administered encounter medications on file as of 09/04/2022.    Allergies (verified) Patient has no known allergies.   History: Past Medical History:  Diagnosis Date   Abnormal ankle brachial index (ABI) 07/05/2019   ABI of the toe, at home health  visit was abnormal for severe PAD-reading obtained left large toe which has callus formation.   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   BPH (benign prostatic hyperplasia) 04/23/2007   Qualifier: Diagnosis of  By: Paulina Fusi RN, Daine Gravel    Carotid atherosclerosis 01/21/2020   Chronic cough 07/05/2019   Chronic pain 04/29/2019   Degeneration of lumbar intervertebral disc 12/09/2019   Dyslipidemia 04/23/2007   Qualifier: Diagnosis of  By: Paulina Fusi RN, Daine Gravel    Essential hypertension 06/02/2008   Qualifier:  Diagnosis of  By: Burnice Logan  MD, Doretha Sou    Hypothyroidism 05/13/2019   LAE (left atrial enlargement) 05/13/2019   Mild aortic stenosis 08/07/2021   Mixed dyslipidemia 08/07/2021   Moderate mitral regurgitation 08/07/2021   Neuropathy    s/p trauma to left lower ext (horse accident)   Osteoarthritis 04/23/2007   Qualifier: Diagnosis of  By: Paulina Fusi RN, Daine Gravel    Palpitations 02/04/2008   Qualifier: Diagnosis of  By: Burnice Logan  MD, Doretha Sou    Sensorineural hearing loss (SNHL), bilateral 06/14/2016   TIA (transient ischemic attack) 05/13/2019   Tinnitus 02/28/2010   deaf left ear(no hearing)   Visual field defect 04/29/2019   Past Surgical History:  Procedure Laterality Date   APPENDECTOMY     CATARACT EXTRACTION Bilateral 04/2020   COLONOSCOPY     HERNIA REPAIR     left foot surgery for fx  Jul 30, 2011   LITHOTRIPSY  1992   cystoscopy with stent placement also done(1 stone fragment remains)   right shoulder replacement  Sep 28, 2009   TOTAL KNEE ARTHROPLASTY Right 08/30/2013   Procedure: RIGHT TOTAL KNEE ARTHROPLASTY RIGHT ;  Surgeon: Gearlean Alf, MD;  Location: [facility];  Service: Orthopedics;  Laterality: Right;   TOTAL SHOULDER ARTHROPLASTY Left 02/02/2015   Procedure: LEFT TOTAL SHOULDER ARTHROPLASTY;  Surgeon: Justice Britain, MD;  Location: [place];  Service: Orthopedics;  Laterality: Left;   TRANSURETHRAL RESECTION OF PROSTATE  03/27/2012   Procedure: TRANSURETHRAL RESECTION OF THE PROSTATE WITH GYRUS INSTRUMENTS;  Surgeon: Claybon Jabs, MD;  Location: [facility];  Service: Urology;;   Family History  Problem Relation Age of Onset   Heart disease Mother 58       MI   Stroke Father 46   COPD Sister    Diabetes Sister    Diabetes Brother    Social History   Socioeconomic History   Marital status: Married    Spouse name: Not on file   Number of children: Not on file   Years of education: Not on file   Highest education level: Not on file  Occupational History    Not on file  Tobacco Use   Smoking status: Never   Smokeless tobacco: Never  Vaping Use   Vaping Use: Never used  Substance and Sexual Activity   Alcohol use: No   Drug use: No   Sexual activity: Yes    Partners: Female  Other Topics Concern   Not on file  Social History Narrative   Marital status/children/pets: Married   Education/employment: retired   Investment banker, operational of Radio broadcast assistant Strain: [place]  (08/29/2021)   Overall Financial Resource Strain (CARDIA)    Difficulty of Paying Living Expenses: Not hard at all  Food Insecurity: No Food Insecurity (08/29/2021)   Hunger Vital Sign    Worried About Running Out of Food in the Last Year:  Never true    Ran Out of Food in the Last Year: Never true  Transportation Needs: No Transportation Needs (08/29/2021)   PRAPARE - Hydrologist (Medical): No    Lack of Transportation (Non-Medical): No  Physical Activity: Sufficiently Active (08/29/2021)   Exercise Vital Sign    Days of Exercise per Week: 5 days    Minutes of Exercise per Session: 40 min  Stress: No Stress Concern Present (08/29/2021)   [place]    Feeling of Stress : Not at all  Social Connections: Moderately Integrated (09/04/2022)   Social Connection and Isolation Panel [NHANES]    Frequency of Communication with Friends and Family: More than three times a week    Frequency of Social Gatherings with Friends and Family: More than three times a week    Attends Religious Services: More than 4 times per year    Active Member of [employer] or Organizations: No    Attends Music therapist: Never    Marital Status: Married    Tobacco Counseling Counseling given: Not Answered   Clinical Intake:     Pain : No/denies pain     Nutritional Risks: None Diabetes: No  How often do you need to have someone help you when you read instructions,  pamphlets, or other written materials from your doctor or pharmacy?: 1 - Never What is the last grade level you completed in school?: 12th  Diabetic?no         Activities of Daily Living    09/04/2022    2:23 PM  In your present state of health, do you have any difficulty performing the following activities:  Hearing? 1  Vision? 0  Difficulty concentrating or making decisions? 0  Walking or climbing stairs? 0  Dressing or bathing? 0  Doing errands, shopping? 0  Preparing Food and eating ? N  Using the Toilet? N  In the past six months, have you accidently leaked urine? Y  Do you have problems with loss of bowel control? N  Managing your Medications? N  Managing your Finances? N  Housekeeping or managing your Housekeeping? N    Patient Care Team: Ma Hillock, DO as PCP - General (Family Medicine) [name], MD as Consulting Physician (Dermatology) Revankar, Reita Cliche, MD as Consulting Physician (Cardiology) Ortho, [facility] (Specialist) Odette Fraction [facility])  Indicate any recent Medical Services you may have received from other than [facility] providers in the past year (date may be approximate).     Assessment:   This is a routine wellness examination for Esaiah.  Hearing/Vision screen No results found.  Dietary issues and exercise activities discussed: Current Exercise Habits: Home exercise routine, Type of exercise: walking, Time (Minutes): 20, Frequency (Times/Week): 3, Weekly Exercise (Minutes/Week): 60, Exercise limited by: None identified   Goals Addressed   None    Depression Screen    09/04/2022    2:23 PM 08/29/2021    2:18 PM 03/20/2021    9:37 AM 08/23/2020    2:41 PM 01/19/2019    2:24 PM 04/09/2018    8:39 AM 04/01/2016    9:32 AM  PHQ 2/9 Scores  PHQ - 2 Score 0 1 0 0 0 0 0    Fall Risk    09/04/2022    2:23 PM 08/29/2021    2:20 PM 03/20/2021    9:37 AM 08/23/2020    2:40 PM 06/30/2019    3:39  PM  [place] in the past  year? 0 0 0 0   Number falls in past yr: 0 0 0 0   Injury with Fall? 0 0 0 0   Risk for fall due to :  Impaired vision     Risk for fall due to: Comment  stumble at times     Follow up Falls evaluation completed Falls prevention discussed Falls evaluation completed Falls prevention discussed Falls evaluation completed  Comment     Completed by [employer] home visit    [place]:  Any stairs in or around the home? Yes  If so, are there any without handrails? No  Home free of loose throw rugs in walkways, pet beds, electrical cords, etc? Yes  Adequate lighting in your home to reduce risk of falls? Yes   ASSISTIVE DEVICES UTILIZED TO PREVENT FALLS:  Life alert? No  Use of a cane, walker or w/c? No  Grab bars in the bathroom? Yes  Shower chair or bench in shower? Yes  Elevated toilet seat or a handicapped toilet? No   TIMED UP AND GO:  Was the test performed? No .  Length of time to ambulate 10 feet: n/a sec.     Cognitive Function:        08/29/2021    2:22 PM  6CIT Screen  What Year? 0 points  What month? 0 points  What time? 0 points  Count back from 20 0 points  Months in reverse 0 points  Repeat phrase 2 points  Total Score 2 points    Immunizations Immunization History  Administered Date(s) Administered   COVID-19, mRNA, vaccine(Comirnaty)12 years and older 08/20/2022   Fluad Quad(high Dose 65+) 05/27/2019, 07/01/2022, 07/24/2022   Influenza Split 06/12/2011   Influenza Whole 05/31/2010   Influenza, High Dose Seasonal PF 09/07/2014, 07/10/2016, 05/14/2017, 06/28/2018, 06/11/2021   Influenza-Unspecified 09/06/2014, 05/19/2017, 07/03/2020   PFIZER Comirnaty(Gray Top)Covid-19 Tri-Sucrose Vaccine 03/02/2021   PFIZER(Purple Top)SARS-COV-2 Vaccination 11/08/2019, 11/29/2019, 06/19/2020   Pfizer Covid-19 Vaccine Bivalent Booster 72yr & up 08/15/2021   Pneumococcal Conjugate-13 09/07/2014   Pneumococcal Polysaccharide-23  05/14/2017   Td 05/16/2009   Tdap 02/11/2016   Zoster Recombinat (Shingrix) 05/04/2021, 07/11/2021   Zoster, Live 06/12/2011    TDAP status: Up to date  Flu Vaccine status: Up to date  Pneumococcal vaccine status: Up to date  Covid-19 vaccine status: Completed vaccines  Qualifies for Shingles Vaccine? Yes   Zostavax completed No   Shingrix Completed?: Yes  Screening Tests Health Maintenance  Topic Date Due   Medicare Annual Wellness (AWV)  08/01/2023   DTaP/Tdap/Td (3 - Td or Tdap) 02/10/2026   Pneumonia Vaccine 86 Years old  Completed   INFLUENZA VACCINE  Completed   Zoster Vaccines- Shingrix  Completed   HPV VACCINES  Aged Out   COVID-19 Vaccine  Discontinued    Health Maintenance  There are no preventive care reminders to display for this patient.  Colorectal cancer screening: No longer required.   Lung Cancer Screening: (Low Dose CT Chest recommended if Age 84-80years, 30 pack-year currently smoking OR have quit w/in 15years.) does not qualify.   Lung Cancer Screening Referral: n/a  Additional Screening:  Hepatitis C Screening: does not qualify; Completed n/a  Vision Screening: Recommended annual ophthalmology exams for early detection of glaucoma and other disorders of the eye. Is the patient up to date with their annual eye exam?  Yes  Who is the  provider or what is the name of the office in which the patient attends annual eye exams? Dr. Gwynn Burly If pt is not established with a provider, would they like to be referred to a provider to establish care? No .   Dental Screening: Recommended annual dental exams for proper oral hygiene  Community Resource Referral / Chronic Care Management: CRR required this visit?  No   CCM required this visit?  No      Plan:     I have personally reviewed and noted the following in the patient's chart:   Medical and social history Use of alcohol, tobacco or illicit drugs  Current medications and supplements  including opioid prescriptions. Patient is not currently taking opioid prescriptions. Functional ability and status Nutritional status Physical activity Advanced directives List of other physicians Hospitalizations, surgeries, and ER visits in previous 12 months Vitals Screenings to include cognitive, depression, and falls Referrals and appointments  In addition, I have reviewed and discussed with patient certain preventive protocols, quality metrics, and best practice recommendations. A written personalized care plan for preventive services as well as general preventive health recommendations were provided to patient.     Beatrix Fetters, [place]   09/04/2022   Nurse Notes: Non-Face to Face or Face to Face 5 minute visit Encounter   Mr. Mclure , Thank you for taking time to come for your Medicare Wellness Visit. I appreciate your ongoing commitment to your health goals. Please review the following plan we discussed and let me know if I can assist you in the future.   These are the goals we discussed:  Goals      Patient Stated     Continue current level of activity     Patient Stated     Get more energy         This is a list of the screening recommended for you and due dates:  Health Maintenance  Topic Date Due   Medicare Annual Wellness Visit  09/05/2023   DTaP/Tdap/Td vaccine (3 - Td or Tdap) 02/10/2026   Pneumonia Vaccine  Completed   Flu Shot  Completed   Zoster (Shingles) Vaccine  Completed   HPV Vaccine  Aged Out   COVID-19 Vaccine  Discontinued

## 2022-09-04 NOTE — Patient Instructions (Signed)
Health Maintenance, Male Adopting a healthy lifestyle and getting preventive care are important in promoting health and wellness. Ask your health care provider about: The right schedule for you to have regular tests and exams. Things you can do on your own to prevent diseases and keep yourself healthy. What should I know about diet, weight, and exercise? Eat a healthy diet  Eat a diet that includes plenty of vegetables, fruits, low-fat dairy products, and lean protein. Do not eat a lot of foods that are high in solid fats, added sugars, or sodium. Maintain a healthy weight Body mass index (BMI) is a measurement that can be used to identify possible weight problems. It estimates body fat based on height and weight. Your health care provider can help determine your BMI and help you achieve or maintain a healthy weight. Get regular exercise Get regular exercise. This is one of the most important things you can do for your health. Most adults should: Exercise for at least 150 minutes each week. The exercise should increase your heart rate and make you sweat (moderate-intensity exercise). Do strengthening exercises at least twice a week. This is in addition to the moderate-intensity exercise. Spend less time sitting. Even light physical activity can be beneficial. Watch cholesterol and blood lipids Have your blood tested for lipids and cholesterol at 84 years of age, then have this test every 5 years. You may need to have your cholesterol levels checked more often if: Your lipid or cholesterol levels are high. You are older than 84 years of age. You are at high risk for heart disease. What should I know about cancer screening? Many types of cancers can be detected early and may often be prevented. Depending on your health history and family history, you may need to have cancer screening at various ages. This may include screening for: Colorectal cancer. Prostate cancer. Skin cancer. Lung  cancer. What should I know about heart disease, diabetes, and high blood pressure? Blood pressure and heart disease High blood pressure causes heart disease and increases the risk of stroke. This is more likely to develop in people who have high blood pressure readings or are overweight. Talk with your health care provider about your target blood pressure readings. Have your blood pressure checked: Every 3-5 years if you are 18-39 years of age. Every year if you are 40 years old or older. If you are between the ages of 65 and 75 and are a current or former smoker, ask your health care provider if you should have a one-time screening for abdominal aortic aneurysm (AAA). Diabetes Have regular diabetes screenings. This checks your fasting blood sugar level. Have the screening done: Once every three years after age 45 if you are at a normal weight and have a low risk for diabetes. More often and at a younger age if you are overweight or have a high risk for diabetes. What should I know about preventing infection? Hepatitis B If you have a higher risk for hepatitis B, you should be screened for this virus. Talk with your health care provider to find out if you are at risk for hepatitis B infection. Hepatitis C Blood testing is recommended for: Everyone born from 1945 through 1965. Anyone with known risk factors for hepatitis C. Sexually transmitted infections (STIs) You should be screened each year for STIs, including gonorrhea and chlamydia, if: You are sexually active and are younger than 84 years of age. You are older than 84 years of age and your   health care provider tells you that you are at risk for this type of infection. Your sexual activity has changed since you were last screened, and you are at increased risk for chlamydia or gonorrhea. Ask your health care provider if you are at risk. Ask your health care provider about whether you are at high risk for HIV. Your health care provider  may recommend a prescription medicine to help prevent HIV infection. If you choose to take medicine to prevent HIV, you should first get tested for HIV. You should then be tested every 3 months for as long as you are taking the medicine. Follow these instructions at home: Alcohol use Do not drink alcohol if your health care provider tells you not to drink. If you drink alcohol: Limit how much you have to 0-2 drinks a day. Know how much alcohol is in your drink. In the [place] one drink equals one 12 oz bottle of beer (355 mL), one 5 oz glass of wine (148 mL), or one 1 oz glass of hard liquor (44 mL). Lifestyle Do not use any products that contain nicotine or tobacco. These products include cigarettes, chewing tobacco, and vaping devices, such as e-cigarettes. If you need help quitting, ask your health care provider. Do not use street drugs. Do not share needles. Ask your health care provider for help if you need support or information about quitting drugs. General instructions Schedule regular health, dental, and eye exams. Stay current with your vaccines. Tell your health care provider if: You often feel depressed. You have ever been abused or do not feel safe at home. Summary Adopting a healthy lifestyle and getting preventive care are important in promoting health and wellness. Follow your health care provider's instructions about healthy diet, exercising, and getting tested or screened for diseases. Follow your health care provider's instructions on monitoring your cholesterol and blood pressure. This information is not intended to replace advice given to you by your health care provider. Make sure you discuss any questions you have with your health care provider. Document Revised: 01/22/2021 Document Reviewed: 01/22/2021 Elsevier Patient Education  2023 Elsevier Inc.  

## 2022-09-13 ENCOUNTER — Encounter: Payer: Self-pay | Admitting: Family Medicine

## 2022-09-13 ENCOUNTER — Ambulatory Visit (INDEPENDENT_AMBULATORY_CARE_PROVIDER_SITE_OTHER): Payer: PPO | Admitting: Family Medicine

## 2022-09-13 VITALS — BP 130/67 | HR 62 | Temp 97.4°F | Ht 69.0 in | Wt 181.0 lb

## 2022-09-13 DIAGNOSIS — G629 Polyneuropathy, unspecified: Secondary | ICD-10-CM | POA: Diagnosis not present

## 2022-09-13 DIAGNOSIS — E785 Hyperlipidemia, unspecified: Secondary | ICD-10-CM

## 2022-09-13 DIAGNOSIS — E034 Atrophy of thyroid (acquired): Secondary | ICD-10-CM | POA: Diagnosis not present

## 2022-09-13 DIAGNOSIS — I359 Nonrheumatic aortic valve disorder, unspecified: Secondary | ICD-10-CM

## 2022-09-13 DIAGNOSIS — G459 Transient cerebral ischemic attack, unspecified: Secondary | ICD-10-CM

## 2022-09-13 DIAGNOSIS — I10 Essential (primary) hypertension: Secondary | ICD-10-CM | POA: Diagnosis not present

## 2022-09-13 MED ORDER — FLUOCINOLONE ACETONIDE 0.01 % EX SOLN: Freq: Two times a day (BID) | CUTANEOUS | 1 refills | Status: DC

## 2022-09-13 MED ORDER — CELECOXIB 200 MG PO CAPS: 200.0000 mg | ORAL_CAPSULE | Freq: Every day | ORAL | 1 refills | Status: DC

## 2022-09-13 MED ORDER — GABAPENTIN 600 MG PO TABS: 600.0000 mg | ORAL_TABLET | Freq: Two times a day (BID) | ORAL | 1 refills | Status: DC

## 2022-09-13 MED ORDER — SIMVASTATIN 40 MG PO TABS: 40.0000 mg | ORAL_TABLET | Freq: Every day | ORAL | 3 refills | Status: DC

## 2022-09-13 MED ORDER — POTASSIUM CITRATE ER 10 MEQ (1080 MG) PO TBCR: 20.0000 meq | EXTENDED_RELEASE_TABLET | Freq: Two times a day (BID) | ORAL | 1 refills | Status: DC

## 2022-09-13 NOTE — Patient Instructions (Addendum)
Return in about 24 weeks (around 02/28/2023) for cpe (20 min), Routine chronic condition follow-up.        Great to see you today.  I have refilled the medication(s) we provide.   If labs were collected, we will inform you of lab results once received either by echart message or telephone call.   - echart message- for normal results that have been seen by the patient already.   - telephone call: abnormal results or if patient has not viewed results in their echart.

## 2022-09-13 NOTE — Progress Notes (Signed)
Patient Care Team    Relationship Specialty Notifications Start End  Ma Hillock, DO PCP - General Family Medicine  01/19/19   [name], MD Consulting Physician Dermatology  01/19/20   Revankar, Reita Cliche, MD Consulting Physician Cardiology  01/19/20   Manson Passey, Emerge  Specialist  01/19/20   Odette Fraction  Optometry  01/19/20    Chief Complaint  Patient presents with   Hypertension    [facility]; pt is not fasting   SUBJECTIVE HPI: [name] [name] is a 86 y.o. male present for chronic medical condition follow-up  Hypertension/hyperlipidemia: Pt reports compliance with Zocor 40 mg nightly.  Cardiology stopped his losartan secondary to low blood pressures.  Patient denies chest pain, shortness of breath, dizziness or lower extremity edema.  Pt takes a daily baby ASA. Pt is  prescribed statin. Labs up-to-date. 08/2021 Diet: Monitors his diet Exercise: Routine exercise RF: Hypertension, hyperlipidemia, family history of heart disease and stroke  BPH/arthritis/neuropathy: Patient has a history of arthritis status post shoulder replacement and knee arthroplasty.  He also has a history of BPH/s/p TURP.  He states he takes Celebrex 200 mg daily and this medication has been extremely helpful for his BPH and his arthritis.Marland Kitchen He did try the Flomax and it was not helpful.  He sustained a lower extremity fracture injury in which he has chronic neuropathy.  Celebrex 200 mg daily and gabapentin 600 mg twice daily is very helpful for pain and to keep active.   ROS: See pertinent positives and negatives per HPI.  Patient Active Problem List   Diagnosis Date Noted   Hypervitaminosis D 07/02/2022   Mild aortic stenosis 08/07/2021   Moderate mitral regurgitation 08/07/2021   Carotid atherosclerosis 01/21/2020   Degeneration of lumbar intervertebral disc 12/09/2019   Abnormal ankle brachial index (ABI) 07/05/2019   Chronic cough 07/05/2019   LAE (left atrial enlargement) 05/13/2019   Aortic  valve calcification 05/13/2019   TIA (transient ischemic attack) 05/13/2019   Hypothyroidism 05/13/2019   Abnormal echocardiogram 05/13/2019   Visual field defect 04/29/2019   Neuropathy- chronic s/p trauma 01/20/2019   Sensorineural hearing loss (SNHL), bilateral 06/14/2016   Tinnitus 02/28/2010   Essential hypertension 06/02/2008   Palpitations 02/04/2008   Dyslipidemia 04/23/2007   BPH (benign prostatic hyperplasia) 04/23/2007    Class: Present on Admission   Osteoarthritis 04/23/2007    Social History   Tobacco Use   Smoking status: Never   Smokeless tobacco: Never  Substance Use Topics   Alcohol use: No    Current Outpatient Medications:    alfuzosin (UROXATRAL) 10 MG 24 hr tablet, SMARTSIG:1 Tablet(s) By Mouth, Disp: , Rfl:    aspirin 81 MG chewable tablet, Chew 81 mg by mouth daily., Disp: , Rfl:    Coenzyme Q10 (COQ10) 200 MG CAPS, Take 200 mg by mouth at bedtime., Disp: , Rfl:    fexofenadine (ALLEGRA) 180 MG tablet, Take 180 mg by mouth daily., Disp: , Rfl:    fluticasone (FLONASE) 50 MCG/ACT nasal spray, Place 1 spray into both nostrils daily., Disp: , Rfl:    Krill Oil 1000 MG CAPS, Take 2,000 mg by mouth daily with breakfast., Disp: , Rfl:    levothyroxine (SYNTHROID) 50 MCG tablet, Take 1 tablet (50 mcg total) by mouth daily before breakfast. Avoid meal at least 30 minutes after taking medication., Disp: 90 tablet, Rfl: 3   Multiple Vitamin (MULTIVITAMIN) tablet, Take 1 tablet by mouth daily., Disp: , Rfl:    Psyllium (  METAMUCIL FIBER PO), Take 2 capsules by mouth daily with breakfast., Disp: , Rfl:    vitamin C (ASCORBIC ACID) 500 MG tablet, Take 500 mg by mouth at bedtime., Disp: , Rfl:    celecoxib (CELEBREX) 200 MG capsule, Take 1 capsule (200 mg total) by mouth daily., Disp: 90 capsule, Rfl: 1   fluocinolone (SYNALAR) 0.01 % external solution, Apply topically 2 (two) times daily., Disp: 60 mL, Rfl: 1   gabapentin (NEURONTIN) 600 MG tablet, Take 1 tablet (600 mg  total) by mouth 2 (two) times daily., Disp: 180 tablet, Rfl: 1   potassium citrate (UROCIT-K) 10 MEQ (1080 MG) SR tablet, Take 2 tablets (20 mEq total) by mouth 2 (two) times daily with a meal., Disp: 360 tablet, Rfl: 1   simvastatin (ZOCOR) 40 MG tablet, Take 1 tablet (40 mg total) by mouth daily at 6 PM., Disp: 90 tablet, Rfl: 3  No Known Allergies  OBJECTIVE: BP 130/67   Pulse 62   Temp (!) 97.4 F (36.3 C) (Oral)   Ht '5\' 9"'$  (1.753 m)   Wt 181 lb (82.1 kg)   SpO2 100%   BMI 26.73 kg/m  Physical Exam Vitals and nursing note reviewed.  Constitutional:      General: He is not in acute distress.    Appearance: Normal appearance. He is not ill-appearing, toxic-appearing or diaphoretic.  HENT:     Head: Normocephalic and atraumatic.     Mouth/Throat:     Mouth: Mucous membranes are moist.  Eyes:     General: No scleral icterus.       Right eye: No discharge.        Left eye: No discharge.     Extraocular Movements: Extraocular movements intact.     Pupils: Pupils are equal, round, and reactive to light.  Cardiovascular:     Rate and Rhythm: Normal rate and regular rhythm.     Heart sounds: Murmur (2/6 SM) heard.  Pulmonary:     Effort: Pulmonary effort is normal. No respiratory distress.     Breath sounds: Normal breath sounds. No wheezing, rhonchi or rales.  Musculoskeletal:     Cervical back: Neck supple.     Right lower leg: No edema.     Left lower leg: Edema present.  Lymphadenopathy:     Cervical: No cervical adenopathy.  Skin:    General: Skin is warm and dry.     Coloration: Skin is not jaundiced or pale.     Findings: No rash.  Neurological:     Mental Status: He is alert and oriented to person, place, and time. Mental status is at baseline.  Psychiatric:        Mood and Affect: Mood normal.        Behavior: Behavior normal.        Thought Content: Thought content normal.        Judgment: Judgment normal.    ASSESSMENT AND PLAN: [name] [name] is a 86  y.o. male present for  Benign prostatic hyperplasia without lower urinary tract symptoms/nephrolithiasis/elevated PSA Stable Continue celebrex. - flomax tried and reported not helpful.  - continue potassium citrate Has been referred to urology  Dyslipidemia/Essential hypertension/Overweight (BMI 25.0-29.9) Stable Cardiology discontinued losartan for low pressures and fatigue. Continue Zocor 40 mg daily> [facility] - continue baby aspirin - low sodium diet and routine exercise.  Follow-up 5.5 months, sooner if blood pressure above goal.  Neuropathy- chronic s/p trauma Stable.  Status post trauma to lower extremity.  Continue gabapentin 600 mg twice daily  Primary osteoarthritis involving multiple joints Stable Continue Celebrex  Hypothyroidism: Continue levo 50 mcg.  Labs due next visit  Abnormal ABI: Discussed signs and symptoms for him to monitor for decreased circulation.  At this time he would like to wait on any further testing.  Follow-up in 5.5 months provider appointment on chronic medical conditions   No orders of the defined types were placed in this encounter.   Meds ordered this encounter  Medications   celecoxib (CELEBREX) 200 MG capsule    Sig: Take 1 capsule (200 mg total) by mouth daily.    Dispense:  90 capsule    Refill:  1   gabapentin (NEURONTIN) 600 MG tablet    Sig: Take 1 tablet (600 mg total) by mouth 2 (two) times daily.    Dispense:  180 tablet    Refill:  1   potassium citrate (UROCIT-K) 10 MEQ (1080 MG) SR tablet    Sig: Take 2 tablets (20 mEq total) by mouth 2 (two) times daily with a meal.    Dispense:  360 tablet    Refill:  1   simvastatin (ZOCOR) 40 MG tablet    Sig: Take 1 tablet (40 mg total) by mouth daily at 6 PM.    Dispense:  90 tablet    Refill:  3   fluocinolone (SYNALAR) 0.01 % external solution    Sig: Apply topically 2 (two) times daily.    Dispense:  60 mL    Refill:  1    Referral Orders  No referral(s) requested today       Howard Pouch, DO 09/13/2022

## 2022-10-14 ENCOUNTER — Ambulatory Visit (INDEPENDENT_AMBULATORY_CARE_PROVIDER_SITE_OTHER): Payer: PPO | Admitting: Family Medicine

## 2022-10-14 ENCOUNTER — Encounter: Payer: Self-pay | Admitting: Family Medicine

## 2022-10-14 VITALS — BP 132/67 | HR 63 | Temp 97.9°F | Ht 69.0 in

## 2022-10-14 DIAGNOSIS — R300 Dysuria: Secondary | ICD-10-CM | POA: Diagnosis not present

## 2022-10-14 DIAGNOSIS — N401 Enlarged prostate with lower urinary tract symptoms: Secondary | ICD-10-CM | POA: Diagnosis not present

## 2022-10-14 DIAGNOSIS — R399 Unspecified symptoms and signs involving the genitourinary system: Secondary | ICD-10-CM | POA: Diagnosis not present

## 2022-10-14 DIAGNOSIS — R350 Frequency of micturition: Secondary | ICD-10-CM | POA: Diagnosis not present

## 2022-10-14 DIAGNOSIS — R351 Nocturia: Secondary | ICD-10-CM

## 2022-10-14 LAB — CBC WITH DIFFERENTIAL/PLATELET
Basophils Absolute: 0.1 10*3/uL (ref 0.0–0.1)
Basophils Relative: 0.8 % (ref 0.0–3.0)
Eosinophils Absolute: 0.3 10*3/uL (ref 0.0–0.7)
Eosinophils Relative: 3.9 % (ref 0.0–5.0)
HCT: 37.5 % — ABNORMAL LOW (ref 39.0–52.0)
Hemoglobin: 13.3 g/dL (ref 13.0–17.0)
Lymphocytes Relative: 16.9 % (ref 12.0–46.0)
Lymphs Abs: 1.2 10*3/uL (ref 0.7–4.0)
MCHC: 35.6 g/dL (ref 30.0–36.0)
MCV: 91.7 fl (ref 78.0–100.0)
Monocytes Absolute: 0.7 10*3/uL (ref 0.1–1.0)
Monocytes Relative: 9.3 % (ref 3.0–12.0)
Neutro Abs: 5.1 10*3/uL (ref 1.4–7.7)
Neutrophils Relative %: 69.1 % (ref 43.0–77.0)
Platelets: 190 10*3/uL (ref 150.0–400.0)
RBC: 4.08 Mil/uL — ABNORMAL LOW (ref 4.22–5.81)
RDW: 14 % (ref 11.5–15.5)
WBC: 7.3 10*3/uL (ref 4.0–10.5)

## 2022-10-14 LAB — COMPREHENSIVE METABOLIC PANEL
ALT: 18 U/L (ref 0–53)
AST: 21 U/L (ref 0–37)
Albumin: 4.7 g/dL (ref 3.5–5.2)
Alkaline Phosphatase: 70 U/L (ref 39–117)
BUN: 34 mg/dL — ABNORMAL HIGH (ref 6–23)
CO2: 28 mEq/L (ref 19–32)
Calcium: 9.5 mg/dL (ref 8.4–10.5)
Chloride: 103 mEq/L (ref 96–112)
Creatinine, Ser: 0.84 mg/dL (ref 0.40–1.50)
GFR: 80.32 mL/min (ref >60.00)
Glucose, Bld: 98 mg/dL (ref 70–99)
Potassium: 4.8 mEq/L (ref 3.5–5.1)
Sodium: 140 mEq/L (ref 135–145)
Total Bilirubin: 0.8 mg/dL (ref 0.2–1.2)
Total Protein: 6.9 g/dL (ref 6.0–8.3)

## 2022-10-14 LAB — POC URINALSYSI DIPSTICK (AUTOMATED)
Bilirubin, UA: NEGATIVE
Blood, UA: NEGATIVE
Glucose, UA: NEGATIVE
Ketones, UA: NEGATIVE
Leukocytes, UA: NEGATIVE
Nitrite, UA: NEGATIVE
Protein, UA: POSITIVE — AB
Spec Grav, UA: 1.015 (ref 1.010–1.025)
Urobilinogen, UA: 0.2 E.U./dL
pH, UA: 6 (ref 5.0–8.0)

## 2022-10-14 MED ORDER — CEFDINIR 300 MG PO CAPS: 300.0000 mg | ORAL_CAPSULE | Freq: Two times a day (BID) | ORAL | 0 refills | Status: AC

## 2022-10-14 NOTE — Progress Notes (Signed)
[name] [name] , 30-May-1936, 87 y.o., male MRN: [id number] Patient Care Team    Relationship Specialty Notifications Start End  Ma Hillock, DO PCP - General Family Medicine  01/19/19   [name], MD Consulting Physician Dermatology  01/19/20   Revankar, Reita Cliche, MD Consulting Physician Cardiology  01/19/20   Manson Passey, Emerge  Specialist  01/19/20   Odette Fraction  Optometry  01/19/20     Chief Complaint  Patient presents with   Dysuria    Pt c/o urine freq, dysuria, low back pain, nocturia, hesitancy and fatigue worsen in the last month     Subjective: Pt presents for an OV with complaints of urinary frequency, dysuria, increase in his nocturia with fatigue present over the last month.  Patient is established with urology and has an appointment coming up next week.  He states he wanted to make sure he did not have a urinary tract infection that was creating the changes noted.  He denies any fevers or chills.  He has not responded well to any BPH medications such as Flomax.  He denies any fevers, chills or nausea.  He does have a history of kidney stones.  Also has a history of chronic low back pain from degenerative lumbar arthritis.     09/04/2022    2:23 PM 08/29/2021    2:18 PM 03/20/2021    9:37 AM 08/23/2020    2:41 PM 01/19/2019    2:24 PM  Depression screen PHQ 2/9  Decreased Interest 0 0 0 0 0  Down, Depressed, Hopeless 0 1 0 0 0  PHQ - 2 Score 0 1 0 0 0    No Known Allergies Social History   Social History Narrative   Marital status/children/pets: Married   Education/employment: retired   Past Medical History:  Diagnosis Date   Abnormal ankle brachial index (ABI) 07/05/2019   ABI of the toe, at home health visit was abnormal for severe PAD-reading obtained left large toe which has callus formation.   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   BPH (benign prostatic  hyperplasia) 04/23/2007   Qualifier: Diagnosis of  By: Paulina Fusi RN, Daine Gravel    Carotid atherosclerosis 01/21/2020   Chronic cough 07/05/2019   Chronic pain 04/29/2019   Degeneration of lumbar intervertebral disc 12/09/2019   Dyslipidemia 04/23/2007   Qualifier: Diagnosis of  By: Paulina Fusi RN, Daine Gravel    Essential hypertension 06/02/2008   Qualifier: Diagnosis of  By: Burnice Logan  MD, Doretha Sou    Hypothyroidism 05/13/2019   Kidney stones    LAE (left atrial enlargement) 05/13/2019   Mild aortic stenosis 08/07/2021   Mixed dyslipidemia 08/07/2021   Moderate mitral regurgitation 08/07/2021   Neuropathy    s/p trauma to left lower ext (horse accident)   Osteoarthritis 04/23/2007   Qualifier: Diagnosis of  By: Paulina Fusi RN, Daine Gravel    Palpitations 02/04/2008   Qualifier: Diagnosis of  By: Burnice Logan  MD, Doretha Sou    Sensorineural hearing loss (SNHL), bilateral 06/14/2016   TIA (transient ischemic attack) 05/13/2019   Tinnitus 02/28/2010   deaf left ear(no hearing)   Visual field defect 04/29/2019   Past Surgical History:  Procedure Laterality Date   APPENDECTOMY     CATARACT EXTRACTION Bilateral 04/2020   COLONOSCOPY     HERNIA REPAIR     left foot surgery for fx  Jul 30, 2011  LITHOTRIPSY  1992   cystoscopy with stent placement also done(1 stone fragment remains)   right shoulder replacement  Sep 28, 2009   TOTAL KNEE ARTHROPLASTY Right 08/30/2013   Procedure: RIGHT TOTAL KNEE ARTHROPLASTY RIGHT ;  Surgeon: Gearlean Alf, MD;  Location: [facility];  Service: Orthopedics;  Laterality: Right;   TOTAL SHOULDER ARTHROPLASTY Left 02/02/2015   Procedure: LEFT TOTAL SHOULDER ARTHROPLASTY;  Surgeon: Justice Britain, MD;  Location: [facility];  Service: Orthopedics;  Laterality: Left;   TRANSURETHRAL RESECTION OF PROSTATE  03/27/2012   Procedure: TRANSURETHRAL RESECTION OF THE PROSTATE WITH GYRUS INSTRUMENTS;  Surgeon: Claybon Jabs, MD;  Location: [facility];  Service: Urology;;   Family History   Problem Relation Age of Onset   Heart disease Mother 15       MI   Stroke Father 36   COPD Sister    Diabetes Sister    Diabetes Brother    Allergies as of 10/14/2022   No Known Allergies      Medication List        Accurate as of October 14, 2022 12:58 PM. If you have any questions, ask your nurse or doctor.          STOP taking these medications    alfuzosin 10 MG 24 hr tablet Commonly known as: UROXATRAL Stopped by: Howard Pouch, DO       TAKE these medications    ascorbic acid 500 MG tablet Commonly known as: VITAMIN C Take 500 mg by mouth at bedtime.   aspirin 81 MG chewable tablet Chew 81 mg by mouth daily.   cefdinir 300 MG capsule Commonly known as: OMNICEF Take 1 capsule (300 mg total) by mouth 2 (two) times daily for 5 days. Started by: Howard Pouch, DO   celecoxib 200 MG capsule Commonly known as: CELEBREX Take 1 capsule (200 mg total) by mouth daily.   CoQ10 200 MG Caps Take 200 mg by mouth at bedtime.   fexofenadine 180 MG tablet Commonly known as: ALLEGRA Take 180 mg by mouth daily.   fluocinolone 0.01 % external solution Commonly known as: SYNALAR Apply topically 2 (two) times daily.   fluticasone 50 MCG/ACT nasal spray Commonly known as: FLONASE Place 1 spray into both nostrils daily.   gabapentin 600 MG tablet Commonly known as: NEURONTIN Take 1 tablet (600 mg total) by mouth 2 (two) times daily.   Krill Oil 1000 MG Caps Take 2,000 mg by mouth daily with breakfast.   levothyroxine 50 MCG tablet Commonly known as: SYNTHROID Take 1 tablet (50 mcg total) by mouth daily before breakfast. Avoid meal at least 30 minutes after taking medication.   METAMUCIL FIBER PO Take 2 capsules by mouth daily with breakfast.   multivitamin tablet Take 1 tablet by mouth daily.   potassium citrate 10 MEQ (1080 MG) SR tablet Commonly known as: UROCIT-K Take 2 tablets (20 mEq total) by mouth 2 (two) times daily with a meal.   simvastatin  40 MG tablet Commonly known as: ZOCOR Take 1 tablet (40 mg total) by mouth daily at 6 PM.        All past medical history, surgical history, allergies, family history, immunizations andmedications were updated in the EMR today and reviewed under the history and medication portions of their EMR.     ROS Negative, with the exception of above mentioned in HPI   Objective:  BP 132/67   Pulse 63   Temp 97.9 F (36.6 C) (Oral)  Ht '5\' 9"'$  (1.753 m)   SpO2 100%   BMI 26.73 kg/m  Body mass index is 26.73 kg/m. Physical Exam Vitals and nursing note reviewed.  Constitutional:      General: He is not in acute distress.    Appearance: Normal appearance. He is not ill-appearing, toxic-appearing or diaphoretic.  HENT:     Head: Normocephalic and atraumatic.  Eyes:     General: No scleral icterus.       Right eye: No discharge.        Left eye: No discharge.     Extraocular Movements: Extraocular movements intact.     Pupils: Pupils are equal, round, and reactive to light.  Abdominal:     General: Abdomen is flat.     Palpations: Abdomen is soft.     Tenderness: There is no abdominal tenderness. There is right CVA tenderness and left CVA tenderness. There is no guarding or rebound.  Skin:    General: Skin is warm and dry.     Coloration: Skin is not jaundiced or pale.     Findings: No rash.  Neurological:     Mental Status: He is alert and oriented to person, place, and time. Mental status is at baseline.  Psychiatric:        Mood and Affect: Mood normal.        Behavior: Behavior normal.        Thought Content: Thought content normal.        Judgment: Judgment normal.    No results found. No results found. Results for orders placed or performed in visit on 10/14/22 (from the past 24 hour(s))  POCT Urinalysis Dipstick (Automated)     Status: Abnormal   Collection Time: 10/14/22 11:30 AM  Result Value Ref Range   Color, UA yellow    Clarity, UA clear    Glucose, UA  Negative Negative   Bilirubin, UA neg    Ketones, UA neg    Spec Grav, UA 1.015 1.010 - 1.025   Blood, UA neg    pH, UA 6.0 5.0 - 8.0   Protein, UA Positive (A) Negative   Urobilinogen, UA 0.2 0.2 or 1.0 E.U./dL   Nitrite, UA neg    Leukocytes, UA Negative Negative    Assessment/Plan: RANON COVEN is a 87 y.o. male present for OV for  Dysuria/urinary frequency/nocturia/BPH/symptom involving bladder Patient's seems stable today.  Vital signs are stable. He did have bilateral CVA tenderness on exam, which may be a false positive secondary to his chronic lumbar degenerative disc disease. His urinalysis did not appear infectious today, however with his CVA tenderness elected to go ahead and start treatment soon for urine culture and obtain CBC and CMP. Start Omnicef twice daily x 5 days - POCT Urinalysis Dipstick (Automated) - Urinalysis w microscopic + reflex cultur - CBC w/Diff - Comp Met (CMET) Follow-up dependent upon results.  He does have urology follow-up next week.   Reviewed expectations re: course of current medical issues. Discussed self-management of symptoms. Outlined signs and symptoms indicating need for more acute intervention. Patient verbalized understanding and all questions were answered. Patient received an After-Visit Summary.    Orders Placed This Encounter  Procedures   Urinalysis w microscopic + reflex cultur   CBC w/Diff   Comp Met (CMET)   POCT Urinalysis Dipstick (Automated)   Meds ordered this encounter  Medications   cefdinir (OMNICEF) 300 MG capsule    Sig: Take 1 capsule (300 mg total)  by mouth 2 (two) times daily for 5 days.    Dispense:  10 capsule    Refill:  0   Referral Orders  No referral(s) requested today     Note is dictated utilizing voice recognition software. Although note has been proof read prior to signing, occasional typographical errors still can be missed. If any questions arise, please do not hesitate to call for  verification.   electronically signed by:  Howard Pouch, DO  [place]

## 2022-10-14 NOTE — Patient Instructions (Signed)
Return if symptoms worsen or fail to improve.        Great to see you today.  I have refilled the medication(s) we provide.   If labs were collected, we will inform you of lab results once received either by echart message or telephone call.   - echart message- for normal results that have been seen by the patient already.   - telephone call: abnormal results or if patient has not viewed results in their echart.  

## 2022-10-16 LAB — URINE CULTURE
MICRO NUMBER:: 14490717
Result:: NO GROWTH
SPECIMEN QUALITY:: ADEQUATE

## 2022-10-16 LAB — URINALYSIS W MICROSCOPIC + REFLEX CULTURE
Bilirubin Urine: NEGATIVE
Glucose, UA: NEGATIVE
Hgb urine dipstick: NEGATIVE
Ketones, ur: NEGATIVE
Nitrites, Initial: NEGATIVE
Protein, ur: NEGATIVE
Specific Gravity, Urine: 1.015 (ref 1.001–1.035)
pH: 6 (ref 5.0–8.0)

## 2022-10-16 LAB — CULTURE INDICATED

## 2022-11-01 ENCOUNTER — Telehealth: Payer: Self-pay

## 2022-11-01 MED ORDER — FLUTICASONE PROPIONATE 50 MCG/ACT NA SUSP: 2.0000 | Freq: Every day | NASAL | 6 refills | Status: DC

## 2022-11-01 NOTE — Telephone Encounter (Signed)
Patient refill request.  [facility]  **do not send to [facility]**  luticasone (FLONASE) 50 MCG/ACT nasal spray

## 2022-11-01 NOTE — Telephone Encounter (Signed)
Spoke with patient regarding results/recommendations.  

## 2022-11-01 NOTE — Telephone Encounter (Signed)
I will fill this script for him.  It is also over the counter now if insurance does not cover.

## 2022-12-20 ENCOUNTER — Emergency Department (HOSPITAL_BASED_OUTPATIENT_CLINIC_OR_DEPARTMENT_OTHER): Payer: PPO

## 2022-12-20 ENCOUNTER — Encounter (HOSPITAL_BASED_OUTPATIENT_CLINIC_OR_DEPARTMENT_OTHER): Payer: Self-pay

## 2022-12-20 ENCOUNTER — Emergency Department (HOSPITAL_BASED_OUTPATIENT_CLINIC_OR_DEPARTMENT_OTHER)
Admission: EM | Admit: 2022-12-20 | Discharge: 2022-12-20 | Disposition: A | Discharge status: Home / Self Care | Payer: PPO | Attending: Emergency Medicine | Admitting: Emergency Medicine

## 2022-12-20 DIAGNOSIS — R0781 Pleurodynia: Secondary | ICD-10-CM | POA: Diagnosis not present

## 2022-12-20 DIAGNOSIS — W109XXA Fall (on) (from) unspecified stairs and steps, initial encounter: Secondary | ICD-10-CM | POA: Diagnosis not present

## 2022-12-20 DIAGNOSIS — Y9301 Activity, walking, marching and hiking: Secondary | ICD-10-CM | POA: Diagnosis not present

## 2022-12-20 DIAGNOSIS — Z7982 Long term (current) use of aspirin: Secondary | ICD-10-CM | POA: Diagnosis not present

## 2022-12-20 NOTE — ED Notes (Signed)
Assumed care, pt went to bathroom before my initial assessment

## 2022-12-20 NOTE — ED Notes (Signed)
Pt reports falling on staircase, pain in right side ribs and right shoulder. Pt reports new mark on head that he isn't sure if it is from the fall.

## 2022-12-20 NOTE — ED Triage Notes (Signed)
Tripped down a few stairs this afternoon, states "swung around on a post and hit my right ribs on a board and heard a pop". C/o right rib pain.

## 2022-12-20 NOTE — ED Provider Notes (Signed)
[facility] Provider Note   CSN: [id number] Arrival date & time: 12/20/22  1407     History  Chief Complaint  Patient presents with   Rib Injury    [name] [name] is a 87 y.o. male.  87 y.o male with no PMH presents to the ED s/p mechanical fall. Patient reports he was walking down the basement holding groceries in both of his hands when suddenly he missed one of the steps, he reports flying sitting his grocery bags in the air, fell landing on the right side of his chest, reports feeling a "loud popping sensation".  He is having pain along the right ribs, exacerbated with any movement along with any deep inspiration. He has not taken any medication for pain control. He did not strike his head, no blood thinners, no shortness of breath or other complaints.   The history is provided by the patient.       Home Medications Prior to Admission medications   Medication Sig Start Date End Date Taking? Authorizing Provider  aspirin 81 MG chewable tablet Chew 81 mg by mouth daily.    [provider]  celecoxib (CELEBREX) 200 MG capsule Take 1 capsule (200 mg total) by mouth daily. 09/13/22   Kuneff, Renee A, DO  Coenzyme Q10 (COQ10) 200 MG CAPS Take 200 mg by mouth at bedtime.    [provider]  fexofenadine (ALLEGRA) 180 MG tablet Take 180 mg by mouth daily.    [provider]  fluocinolone (SYNALAR) 0.01 % external solution Apply topically 2 (two) times daily. 09/13/22   Kuneff, Renee A, DO  fluticasone (FLONASE) 50 MCG/ACT nasal spray Place 2 sprays into both nostrils daily. 11/01/22   Kuneff, Renee A, DO  gabapentin (NEURONTIN) 600 MG tablet Take 1 tablet (600 mg total) by mouth 2 (two) times daily. 09/13/22   Kuneff, Renee A, DO  Krill Oil 1000 MG CAPS Take 2,000 mg by mouth daily with breakfast.    [provider]  levothyroxine (SYNTHROID) 50 MCG tablet Take 1 tablet (50 mcg total) by mouth daily  before breakfast. Avoid meal at least 30 minutes after taking medication. 03/26/22   Kuneff, Renee A, DO  Multiple Vitamin (MULTIVITAMIN) tablet Take 1 tablet by mouth daily.    [provider]  potassium citrate (UROCIT-K) 10 MEQ (1080 MG) SR tablet Take 2 tablets (20 mEq total) by mouth 2 (two) times daily with a meal. 09/13/22   Kuneff, Renee A, DO  Psyllium (METAMUCIL FIBER PO) Take 2 capsules by mouth daily with breakfast.    [provider]  simvastatin (ZOCOR) 40 MG tablet Take 1 tablet (40 mg total) by mouth daily at 6 PM. 09/13/22   Kuneff, Renee A, DO  vitamin C (ASCORBIC ACID) 500 MG tablet Take 500 mg by mouth at bedtime.    [provider]      Allergies    Patient has no known allergies.    Review of Systems   Review of Systems  Constitutional:  Negative for chills and fever.  HENT:  Negative for sore throat.   Respiratory:  Negative for shortness of breath.   Cardiovascular:  Negative for chest pain.  Gastrointestinal:  Negative for abdominal pain, nausea and vomiting.  Genitourinary:  Negative for flank pain.  Musculoskeletal:  Positive for myalgias.  Skin:  Negative for pallor and wound.  Neurological:  Negative for light-headedness and headaches.  All other systems reviewed and are  negative.   Physical Exam Updated Vital Signs BP (!) 165/119 (BP Location: Left Arm)   Pulse 70   Temp 98.1 F (36.7 C) (Oral)   Resp 19   Ht 5\' 9"  (1.753 m)   Wt 81.6 kg   SpO2 97%   BMI 26.58 kg/m  Physical Exam Vitals and nursing note reviewed.  Constitutional:      Appearance: Normal appearance.  HENT:     Head: Normocephalic and atraumatic.     Mouth/Throat:     Mouth: Mucous membranes are moist.  Eyes:     Pupils: Pupils are equal, round, and reactive to light.  Cardiovascular:     Rate and Rhythm: Normal rate.  Pulmonary:     Effort: Pulmonary effort is normal.  Chest:     Chest wall: Tenderness present.    Abdominal:     General:  Abdomen is flat.     Palpations: Abdomen is soft.  Musculoskeletal:     Cervical back: Normal range of motion and neck supple.  Skin:    General: Skin is warm and dry.  Neurological:     Mental Status: He is alert and oriented to person, place, and time.     ED Results / Procedures / Treatments   Labs (all labs ordered are listed, but only abnormal results are displayed) Labs Reviewed - No data to display  EKG None  Radiology DG Ribs Unilateral W/Chest Right  Result Date: 12/20/2022 CLINICAL DATA:  Pain after fall EXAM: RIGHT RIBS AND CHEST - 3 VIEW COMPARISON:  Chest x-ray 10/12/2019 and older FINDINGS: Mild left basilar atelectasis. Eventration of the right hemidiaphragm. No consolidation, pneumothorax, effusion or edema. Normal cardiopericardial silhouette. Bilateral shoulder arthroplasty. Degenerative changes seen along the spine. No rib fracture seen. IMPRESSION: No rib fracture seen.  No pneumothorax. Electronically Signed   By: Karen KaysAshok  Gupta M.D.   On: 12/20/2022 14:39    Procedures Procedures    Medications Ordered in ED Medications - No data to display  ED Course/ Medical Decision Making/ A&P                             Medical Decision Making Amount and/or Complexity of Data Reviewed Radiology: ordered.    Patient presents to the ED status post mechanical fall while ambulating down the steps of his basement, reports falling on the right side of his body.  Having severe pain to the area with a small palpable hematoma but no surrounding bruises or ecchymosis noted.  Plain x-ray film did not show any acute rib fracture.  There is significant pain with any movement.  We discussed obtaining further imaging to further evaluate likely a rib fracture. He was offered pain medication but declined this at this time.   CT Chest showed: 1. No evidence of acute rib fracture or pneumothorax. 2. No acute chest findings. 3. Left renal calculi with associated mild caliectasis. 4.  Progressive degenerative disc disease in the upper lumbar spine. 5. Aortic Atherosclerosis (ICD10-I70.0). Prominent coronary artery atherosclerosis and aortic valvular calcifications.   These results were discussed at length with patient, he was provided with a copy of her CT scan on today's visit.  Suspect likely contusion, we discussed RICE therapy along with close follow-up with primary care physician.  Patient is hemodynamically stable for discharge.   Portions of this note were generated with Scientist, clinical (histocompatibility and immunogenetics)Dragon dictation software. Dictation errors may occur despite best attempts at proofreading.  Final Clinical Impression(s) / ED Diagnoses Final diagnoses:  Rib pain on right side    Rx / DC Orders ED Discharge Orders     None         Claude Manges, PA-C 12/20/22 1650    Lonell Grandchild, MD 12/20/22 2144

## 2022-12-20 NOTE — Discharge Instructions (Addendum)
We discussed the results of your scan on today's visit.  You may alternate Tylenol versus ibuprofen in order to help with your pain.

## 2022-12-25 ENCOUNTER — Ambulatory Visit (INDEPENDENT_AMBULATORY_CARE_PROVIDER_SITE_OTHER): Payer: PPO | Admitting: Family Medicine

## 2022-12-25 ENCOUNTER — Ambulatory Visit (HOSPITAL_BASED_OUTPATIENT_CLINIC_OR_DEPARTMENT_OTHER)
Admission: RE | Admit: 2022-12-25 | Discharge: 2022-12-25 | Disposition: A | Discharge status: Home / Self Care | Payer: PPO | Source: Ambulatory Visit | Attending: Family Medicine | Admitting: Family Medicine

## 2022-12-25 VITALS — BP 144/80 | HR 72 | Wt 187.0 lb

## 2022-12-25 DIAGNOSIS — N200 Calculus of kidney: Secondary | ICD-10-CM

## 2022-12-25 DIAGNOSIS — W19XXXA Unspecified fall, initial encounter: Secondary | ICD-10-CM | POA: Diagnosis not present

## 2022-12-25 DIAGNOSIS — Y939 Activity, unspecified: Secondary | ICD-10-CM | POA: Diagnosis not present

## 2022-12-25 DIAGNOSIS — S2231XA Fracture of one rib, right side, initial encounter for closed fracture: Secondary | ICD-10-CM | POA: Insufficient documentation

## 2022-12-25 DIAGNOSIS — Y929 Unspecified place or not applicable: Secondary | ICD-10-CM | POA: Insufficient documentation

## 2022-12-25 DIAGNOSIS — R0781 Pleurodynia: Secondary | ICD-10-CM

## 2022-12-25 DIAGNOSIS — R0789 Other chest pain: Secondary | ICD-10-CM

## 2022-12-25 HISTORY — DX: Calculus of kidney: N20.0

## 2022-12-25 LAB — COMPREHENSIVE METABOLIC PANEL
ALT: 18 U/L (ref 0–53)
AST: 23 U/L (ref 0–37)
Albumin: 4.7 g/dL (ref 3.5–5.2)
Alkaline Phosphatase: 67 U/L (ref 39–117)
BUN: 30 mg/dL — ABNORMAL HIGH (ref 6–23)
CO2: 28 mEq/L (ref 19–32)
Calcium: 9.7 mg/dL (ref 8.4–10.5)
Chloride: 102 mEq/L (ref 96–112)
Creatinine, Ser: 0.85 mg/dL (ref 0.40–1.50)
GFR: 79.92 mL/min (ref >60.00)
Glucose, Bld: 95 mg/dL (ref 70–99)
Potassium: 4.1 mEq/L (ref 3.5–5.1)
Sodium: 140 mEq/L (ref 135–145)
Total Bilirubin: 0.8 mg/dL (ref 0.2–1.2)
Total Protein: 7 g/dL (ref 6.0–8.3)

## 2022-12-25 LAB — CBC WITH DIFFERENTIAL/PLATELET
Basophils Absolute: 0.1 10*3/uL (ref 0.0–0.1)
Basophils Relative: 1.1 % (ref 0.0–3.0)
Eosinophils Absolute: 0.3 10*3/uL (ref 0.0–0.7)
Eosinophils Relative: 4.5 % (ref 0.0–5.0)
HCT: 38.5 % — ABNORMAL LOW (ref 39.0–52.0)
Hemoglobin: 13.2 g/dL (ref 13.0–17.0)
Lymphocytes Relative: 17.8 % (ref 12.0–46.0)
Lymphs Abs: 1.2 10*3/uL (ref 0.7–4.0)
MCHC: 34.2 g/dL (ref 30.0–36.0)
MCV: 93.3 fl (ref 78.0–100.0)
Monocytes Absolute: 0.5 10*3/uL (ref 0.1–1.0)
Monocytes Relative: 7.5 % (ref 3.0–12.0)
Neutro Abs: 4.8 10*3/uL (ref 1.4–7.7)
Neutrophils Relative %: 69.1 % (ref 43.0–77.0)
Platelets: 205 10*3/uL (ref 150.0–400.0)
RBC: 4.13 Mil/uL — ABNORMAL LOW (ref 4.22–5.81)
RDW: 13.6 % (ref 11.5–15.5)
WBC: 6.9 10*3/uL (ref 4.0–10.5)

## 2022-12-25 NOTE — Progress Notes (Signed)
[name] [name] , 11-17-1935, 87 y.o., male MRN: [id number] Patient Care Team    Relationship Specialty Notifications Start End  Natalia Leatherwood, DO PCP - General Family Medicine  01/19/19   Aris Lot, MD Consulting Physician Dermatology  01/19/20   Revankar, Aundra Dubin, MD Consulting Physician Cardiology  01/19/20   Gaylord Shih Emerge  Specialist  01/19/20   Marlene Bast  Optometry  01/19/20     Chief Complaint  Patient presents with   Fall     Subjective: [name] [name] is a 87 y.o. Pt presents for an OV to follow-up on emergency room visit for a fall.  He was seen in the ED 12/20/2022 after he fell down a set of steps going to the basement.  Fall was mechanical he was carrying groceries.  He did report hearing is significant popping sound, which he reports came from his right rib area.  He was having pain along the ribs on the right side.  He did not sustain a head injury or lose consciousness.  Rib x-ray was completed and did not note any rib fracture or pneumothorax.  There was a small amount of mild left basilar atelectasis and infiltration of the right hemidiaphragm.  There was a hematoma noted over the area.  Due to significant discomfort a CT of his chest was also performed with no acute findings.  Was noted for incidentally left renal calculi associated with mild caliectasis and progressive DDD of the upper lumbar spine. Incidental cardiac findings of aortic atherosclerosis, coronary artery atherosclerosis noted is prominent and aortic valvular calcifications.  For his injuries patient was encouraged to rest, ice.  He had declined pain meds.  Today he reports been taking Tylenol and Advil intermittently.  He does have pain with deep breaths.  He denies any fevers, chills or worsening cough.  He does have a chronic cough.  He does admit to having some shortness of breath 2 nights ago.  He has had a 7 pound weight gain in 5 days.  He reports he also had been experiencing some  constipation her last couple days and bloating, however he states he had a significant bowel movement last night close discussed that turnaround now.  Denies any palpitations or fatigue     09/04/2022    2:23 PM 08/29/2021    2:18 PM 03/20/2021    9:37 AM 08/23/2020    2:41 PM 01/19/2019    2:24 PM  Depression screen PHQ 2/9  Decreased Interest 0 0 0 0 0  Down, Depressed, Hopeless 0 1 0 0 0  PHQ - 2 Score 0 1 0 0 0    No Known Allergies Social History   Social History Narrative   Marital status/children/pets: Married   Education/employment: retired   Past Medical History:  Diagnosis Date   Abnormal ankle brachial index (ABI) 07/05/2019   ABI of the toe, at home health visit was abnormal for severe PAD-reading obtained left large toe which has callus formation.   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   BPH (benign prostatic hyperplasia) 04/23/2007   Qualifier: Diagnosis of  By: Marcelyn Ditty RN, Katy Fitch    Carotid atherosclerosis 01/21/2020   Chronic cough 07/05/2019   Chronic pain 04/29/2019   Degeneration of lumbar intervertebral disc 12/09/2019   Dyslipidemia 04/23/2007   Qualifier: Diagnosis of  By: Marcelyn Ditty RN, Katy Fitch    Essential hypertension 06/02/2008  Qualifier: Diagnosis of  By: Amador Cunas  MD, Janett Labella    Hypothyroidism 05/13/2019   Kidney stones    LAE (left atrial enlargement) 05/13/2019   Mild aortic stenosis 08/07/2021   Mixed dyslipidemia 08/07/2021   Moderate mitral regurgitation 08/07/2021   Neuropathy    s/p trauma to left lower ext (horse accident)   Osteoarthritis 04/23/2007   Qualifier: Diagnosis of  By: Marcelyn Ditty RN, Katy Fitch    Palpitations 02/04/2008   Qualifier: Diagnosis of  By: Amador Cunas  MD, Janett Labella    Sensorineural hearing loss (SNHL), bilateral 06/14/2016   TIA (transient ischemic attack) 05/13/2019   Tinnitus 02/28/2010   deaf left ear(no hearing)    Visual field defect 04/29/2019   Past Surgical History:  Procedure Laterality Date   APPENDECTOMY     CATARACT EXTRACTION Bilateral 04/2020   COLONOSCOPY     HERNIA REPAIR     left foot surgery for fx  Jul 30, 2011   LITHOTRIPSY  1992   cystoscopy with stent placement also done(1 stone fragment remains)   right shoulder replacement  Sep 28, 2009   TOTAL KNEE ARTHROPLASTY Right 08/30/2013   Procedure: RIGHT TOTAL KNEE ARTHROPLASTY RIGHT ;  Surgeon: Loanne Drilling, MD;  Location: [facility];  Service: Orthopedics;  Laterality: Right;   TOTAL SHOULDER ARTHROPLASTY Left 02/02/2015   Procedure: LEFT TOTAL SHOULDER ARTHROPLASTY;  Surgeon: Francena Hanly, MD;  Location: [facility] OR;  Service: Orthopedics;  Laterality: Left;   TRANSURETHRAL RESECTION OF PROSTATE  03/27/2012   Procedure: TRANSURETHRAL RESECTION OF THE PROSTATE WITH GYRUS INSTRUMENTS;  Surgeon: Garnett Farm, MD;  Location: [facility];  Service: Urology;;   Family History  Problem Relation Age of Onset   Heart disease Mother 70       MI   Stroke Father 63   COPD Sister    Diabetes Sister    Diabetes Brother    Allergies as of 12/25/2022   No Known Allergies      Medication List        Accurate as of December 25, 2022 12:38 PM. If you have any questions, ask your nurse or doctor.          ascorbic acid 500 MG tablet Commonly known as: VITAMIN C Take 500 mg by mouth at bedtime.   aspirin 81 MG chewable tablet Chew 81 mg by mouth daily.   celecoxib 200 MG capsule Commonly known as: CELEBREX Take 1 capsule (200 mg total) by mouth daily.   CoQ10 200 MG Caps Take 200 mg by mouth at bedtime.   fexofenadine 180 MG tablet Commonly known as: ALLEGRA Take 180 mg by mouth daily.   fluocinolone 0.01 % external solution Commonly known as: SYNALAR Apply topically 2 (two) times daily.   fluticasone 50 MCG/ACT nasal spray Commonly known as: FLONASE Place 2 sprays into both nostrils daily.   gabapentin 600 MG tablet Commonly  known as: NEURONTIN Take 1 tablet (600 mg total) by mouth 2 (two) times daily.   Krill Oil 1000 MG Caps Take 2,000 mg by mouth daily with breakfast.   levothyroxine 50 MCG tablet Commonly known as: SYNTHROID Take 1 tablet (50 mcg total) by mouth daily before breakfast. Avoid meal at least 30 minutes after taking medication.   METAMUCIL FIBER PO Take 2 capsules by mouth daily with breakfast.   multivitamin tablet Take 1 tablet by mouth daily.   potassium citrate 10 MEQ (1080 MG) SR tablet Commonly known as: UROCIT-K Take 2 tablets (20  mEq total) by mouth 2 (two) times daily with a meal.   simvastatin 40 MG tablet Commonly known as: ZOCOR Take 1 tablet (40 mg total) by mouth daily at 6 PM.        All past medical history, surgical history, allergies, family history, immunizations andmedications were updated in the EMR today and reviewed under the history and medication portions of their EMR.     ROS Negative, with the exception of above mentioned in HPI   Objective:  BP (!) 144/80   Pulse 72   Wt 187 lb (84.8 kg)   SpO2 97%   BMI 27.62 kg/m  Body mass index is 27.62 kg/m. Physical Exam Vitals and nursing note reviewed.  Constitutional:      General: He is not in acute distress.    Appearance: Normal appearance. He is not ill-appearing, toxic-appearing or diaphoretic.  HENT:     Head: Normocephalic and atraumatic.  Eyes:     General: No scleral icterus.       Right eye: No discharge.        Left eye: No discharge.     Extraocular Movements: Extraocular movements intact.     Pupils: Pupils are equal, round, and reactive to light.  Cardiovascular:     Rate and Rhythm: Normal rate and regular rhythm.  Pulmonary:     Effort: Pulmonary effort is normal. No respiratory distress.     Breath sounds: Rales (Right lower lobe) present. No wheezing or rhonchi.  Chest:     Chest wall: Tenderness present.  Abdominal:     General: Abdomen is flat. Bowel sounds are  normal.     Palpations: Abdomen is soft.     Tenderness: There is abdominal tenderness (Right upper quadrant).  Skin:    General: Skin is warm and dry.     Coloration: Skin is not jaundiced or pale.     Findings: Bruising (Right lateral chest/ribs) present. No rash.  Neurological:     Mental Status: He is alert and oriented to person, place, and time. Mental status is at baseline.  Psychiatric:        Mood and Affect: Mood normal.        Behavior: Behavior normal.        Thought Content: Thought content normal.        Judgment: Judgment normal.     No results found. No results found. No results found for this or any previous visit (from the past 24 hour(s)).  Assessment/Plan: [name] [name] is a 87 y.o. male present for OV for  Fall/Rib pain on right side/edema His 7 pound weight gain is concerning for either fluid retention from NSAIDs or possible fluid collection/injury/lungs.   -He is also tender anteriorly over ribs and liver.  Bruising is lateral from fall.  He does have fine crackles right lower lobe on exam today.  Elected to repeat chest x-ray -Continue to monitor for any new onset cough, fever or fatigue suggesting onset of pneumonia. -Ensure to take deep cleansing breaths, completely filling lungs multiple times throughout the day to avoid development of pneumonia. -Pain: He is controlling it with Tylenol and Advil. Incidental finding of kidney stone with caliectasis, collected urinalysis, CMP and CBC today.  Encouraged him to discuss CT findings with his urologist.  We do not have access to his urology images which she reports he had every few years since he had the lithotripsy.  He does not recall which side he had this done  prior.  Currently he seems asymptomatic. Repeat chest x-ray today to get a better understanding of fluid versus infection of the lungs.  We discussed treating for infection with antibiotic versus fluid if present.  We will call patient once we receive  the results and we will further with treatment plan at that time. Currently patient is stable with stable vitals.  Reviewed expectations re: course of current medical issues. Discussed self-management of symptoms. Outlined signs and symptoms indicating need for more acute intervention. Patient verbalized understanding and all questions were answered. Patient received an After-Visit Summary.    Orders Placed This Encounter  Procedures   DG Chest 2 View   Comp Met (CMET)   CBC w/Diff   Urinalysis w microscopic + reflex cultur   No orders of the defined types were placed in this encounter.  Referral Orders  No referral(s) requested today     Note is dictated utilizing voice recognition software. Although note has been proof read prior to signing, occasional typographical errors still can be missed. If any questions arise, please do not hesitate to call for verification.   electronically signed by:  Felix Pacinienee Azaiah Licciardi, DO  [facility] [place]

## 2022-12-26 ENCOUNTER — Telehealth: Payer: Self-pay | Admitting: Family Medicine

## 2022-12-26 LAB — URINALYSIS W MICROSCOPIC + REFLEX CULTURE
RBC / HPF: NONE SEEN /HPF (ref 0–2)
pH: 5.5 (ref 5.0–8.0)

## 2022-12-26 NOTE — Telephone Encounter (Signed)
Please call patient: Electrolytes, liver and kidney function are all normal. Blood cell counts are normal and stable, no signs of anemia or infection. Urinalysis did not show signs of infection or blood  He does have a small amount of fluid in his right lower lobe, most likely from effects of lung contusion.  This is not worrisome, especially since all of his vitals were stable.  The x-ray this time did show a fracture of the eighth rib right along the area he is bruised and tender. There is "minimal "displacement, meaning it is not completely lined up correctly, but with very little displacement.  He will not need any further intervention medically as long as he does not have any complications such as fever, cough, fatigue-or other pneumonialike symptoms.  Make sure to take deep breaths as we discussed.  Avoid any heavy lifting, upper body exercises or any other movements that would stress area.  Rib fractures can take a good 8-12 weeks before they heal.  The last stress placed on this area, the quicker it will heal.   Follow-up if needed

## 2022-12-26 NOTE — Telephone Encounter (Signed)
Spoke with patient regarding results/recommendations.  

## 2022-12-27 LAB — URINALYSIS W MICROSCOPIC + REFLEX CULTURE
Bacteria, UA: NONE SEEN /HPF
Bilirubin Urine: NEGATIVE
Glucose, UA: NEGATIVE
Hgb urine dipstick: NEGATIVE
Hyaline Cast: NONE SEEN /LPF
Ketones, ur: NEGATIVE
Nitrites, Initial: NEGATIVE
Protein, ur: NEGATIVE
Specific Gravity, Urine: 1.008 (ref 1.001–1.035)
Squamous Epithelial / HPF: NONE SEEN /HPF (ref <=5)

## 2022-12-27 LAB — URINE CULTURE
MICRO NUMBER:: 14811094
Result:: NO GROWTH
SPECIMEN QUALITY:: ADEQUATE

## 2022-12-27 LAB — CULTURE INDICATED

## 2023-01-07 DIAGNOSIS — M26622 Arthralgia of left temporomandibular joint: Secondary | ICD-10-CM

## 2023-01-07 DIAGNOSIS — H9202 Otalgia, left ear: Secondary | ICD-10-CM | POA: Insufficient documentation

## 2023-01-07 HISTORY — DX: Otalgia, left ear: H92.02

## 2023-01-07 HISTORY — DX: Arthralgia of left temporomandibular joint: M26.622

## 2023-01-21 ENCOUNTER — Encounter: Payer: Self-pay | Admitting: Cardiology

## 2023-01-21 ENCOUNTER — Ambulatory Visit: Payer: PPO | Attending: Cardiology | Admitting: Cardiology

## 2023-01-21 VITALS — BP 140/84 | HR 63 | Ht 69.0 in | Wt 180.0 lb

## 2023-01-21 DIAGNOSIS — I700 Atherosclerosis of aorta: Secondary | ICD-10-CM | POA: Diagnosis not present

## 2023-01-21 DIAGNOSIS — I340 Nonrheumatic mitral (valve) insufficiency: Secondary | ICD-10-CM

## 2023-01-21 DIAGNOSIS — R600 Localized edema: Secondary | ICD-10-CM | POA: Insufficient documentation

## 2023-01-21 DIAGNOSIS — R011 Cardiac murmur, unspecified: Secondary | ICD-10-CM

## 2023-01-21 DIAGNOSIS — I6529 Occlusion and stenosis of unspecified carotid artery: Secondary | ICD-10-CM

## 2023-01-21 DIAGNOSIS — I10 Essential (primary) hypertension: Secondary | ICD-10-CM

## 2023-01-21 HISTORY — DX: Localized edema: R60.0

## 2023-01-21 MED ORDER — FUROSEMIDE 20 MG PO TABS: 20.0000 mg | ORAL_TABLET | Freq: Every day | ORAL | 3 refills | Status: DC

## 2023-01-21 NOTE — Patient Instructions (Signed)
Please keep a BP log for 2 weeks and send by MyChart or mail.  Blood Pressure Record Sheet To take your blood pressure, you will need a blood pressure machine. You can buy a blood pressure machine (blood pressure monitor) at your clinic, drug store, or online. When choosing one, consider: An automatic monitor that has an arm cuff. A cuff that wraps snugly around your upper arm. You should be able to fit only one finger between your arm and the cuff. A device that stores blood pressure reading results. Do not choose a monitor that measures your blood pressure from your wrist or finger. Follow your health care provider's instructions for how to take your blood pressure. To use this form: Get one reading in the morning (a.m.) 1-2 hours after you take any medicines. Get one reading in the evening (p.m.) before supper.   Blood pressure log Date: _______________________  a.m. _____________________(1st reading) HR___________            p.m. _____________________(2nd reading) HR__________  Date: _______________________  a.m. _____________________(1st reading) HR___________            p.m. _____________________(2nd reading) HR__________  Date: _______________________  a.m. _____________________(1st reading) HR___________            p.m. _____________________(2nd reading) HR__________  Date: _______________________  a.m. _____________________(1st reading) HR___________            p.m. _____________________(2nd reading) HR__________  Date: _______________________  a.m. _____________________(1st reading) HR___________            p.m. _____________________(2nd reading) HR__________  Date: _______________________  a.m. _____________________(1st reading) HR___________            p.m. _____________________(2nd reading) HR__________  Date: _______________________  a.m. _____________________(1st reading) HR___________            p.m. _____________________(2nd reading)  HR__________   This information is not intended to replace advice given to you by your health care provider. Make sure you discuss any questions you have with your health care provider. Document Revised: 12/22/2019 Document Reviewed: 12/22/2019 Elsevier Patient Education  2021 Elsevier Inc.   Medication Instructions:  Your physician has recommended you make the following change in your medication:   Start Furosemide 20 mg daily  *If you need a refill on your cardiac medications before your next appointment, please call your pharmacy*   Lab Work: Your physician recommends that you return for lab work in: the next few days for CMP, CBC, TSH and lipids. You need to have labs done when you are fasting. [facility] lab is located on the 3rd floor, [address]. Hours are Monday - Friday 8 am to 4 pm, closed 11:30 am to 1:00 pm. You do NOT need an appointment.   If you have labs (blood work) drawn today and your tests are completely normal, you will receive your results only by: MyChart Message (if you have MyChart) OR A paper copy in the mail If you have any lab test that is abnormal or we need to change your treatment, we will call you to review the results.   Testing/Procedures: Your physician has requested that you have an echocardiogram. Echocardiography is a painless test that uses sound waves to create images of your heart. It provides your doctor with information about the size and shape of your heart and how well your heart's chambers and valves are working. This procedure takes approximately one hour. There are no restrictions for this procedure. Please do NOT wear cologne, perfume, aftershave, or lotions (deodorant is  allowed). Please arrive 15 minutes prior to your appointment time.     Follow-Up: At [facility], you and your health needs are our priority.  As part of our continuing mission to provide you with exceptional heart care, we have created designated Provider Care  Teams.  These Care Teams include your primary Cardiologist (physician) and Advanced Practice Providers (APPs -  Physician Assistants and Nurse Practitioners) who all work together to provide you with the care you need, when you need it.  We recommend signing up for the patient portal called "MyChart".  Sign up information is provided on this After Visit Summary.  MyChart is used to connect with patients for Virtual Visits (Telemedicine).  Patients are able to view lab/test results, encounter notes, upcoming appointments, etc.  Non-urgent messages can be sent to your provider as well.   To learn more about what you can do with MyChart, go to [email or web address].    Your next appointment:   3 month(s)  The format for your next appointment:   In Person  Provider:   Belva Crome, MD   Other Instructions Echocardiogram An echocardiogram is a test that uses sound waves (ultrasound) to produce images of the heart. Images from an echocardiogram can provide important information about: Heart size and shape. The size and thickness and movement of your heart's walls. Heart muscle function and strength. Heart valve function or if you have stenosis. Stenosis is when the heart valves are too narrow. If blood is flowing backward through the heart valves (regurgitation). A tumor or infectious growth around the heart valves. Areas of heart muscle that are not working well because of poor blood flow or injury from a heart attack. Aneurysm detection. An aneurysm is a weak or damaged part of an artery wall. The wall bulges out from the normal force of blood pumping through the body. Tell a health care provider about: Any allergies you have. All medicines you are taking, including vitamins, herbs, eye drops, creams, and over-the-counter medicines. Any blood disorders you have. Any surgeries you have had. Any medical conditions you have. Whether you are pregnant or may be pregnant. What are the  risks? Generally, this is a safe test. However, problems may occur, including an allergic reaction to dye (contrast) that may be used during the test. What happens before the test? No specific preparation is needed. You may eat and drink normally. What happens during the test? You will take off your clothes from the waist up and put on a hospital gown. Electrodes or electrocardiogram (ECG)patches may be placed on your chest. The electrodes or patches are then connected to a device that monitors your heart rate and rhythm. You will lie down on a table for an ultrasound exam. A gel will be applied to your chest to help sound waves pass through your skin. A handheld device, called a transducer, will be pressed against your chest and moved over your heart. The transducer produces sound waves that travel to your heart and bounce back (or "echo" back) to the transducer. These sound waves will be captured in real-time and changed into images of your heart that can be viewed on a video monitor. The images will be recorded on a computer and reviewed by your health care provider. You may be asked to change positions or hold your breath for a short time. This makes it easier to get different views or better views of your heart. In some cases, you may receive contrast through an IV in  one of your veins. This can improve the quality of the pictures from your heart. The procedure may vary among health care providers and hospitals.   What can I expect after the test? You may return to your normal, everyday life, including diet, activities, and medicines, unless your health care provider tells you not to do that. Follow these instructions at home: It is up to you to get the results of your test. Ask your health care provider, or the department that is doing the test, when your results will be ready. Keep all follow-up visits. This is important. Summary An echocardiogram is a test that uses sound waves (ultrasound)  to produce images of the heart. Images from an echocardiogram can provide important information about the size and shape of your heart, heart muscle function, heart valve function, and other possible heart problems. You do not need to do anything to prepare before this test. You may eat and drink normally. After the echocardiogram is completed, you may return to your normal, everyday life, unless your health care provider tells you not to do that. This information is not intended to replace advice given to you by your health care provider. Make sure you discuss any questions you have with your health care provider. Document Revised: 04/25/2020 Document Reviewed: 04/25/2020 Elsevier Patient Education  2021 Elsevier Inc.   Important Information About Sugar

## 2023-01-21 NOTE — Progress Notes (Signed)
Cardiology Office Note:    Date:  01/21/2023   ID:  ACEION VAAL, DOB 03/12/1936, MRN [id number]  PCP:  Natalia Leatherwood, DO  Cardiologist:  Garwin Brothers, MD   Referring MD: Natalia Leatherwood, DO    ASSESSMENT:    1. Essential hypertension   2. Moderate mitral regurgitation   3. Arteriosclerosis of abdominal aorta (HCC)   4. Carotid atherosclerosis, unspecified laterality   5. Pedal edema    PLAN:    In order of problems listed above:  Primary prevention stressed with the patient.  Importance of compliance with diet medication stressed and patient verbalized standing. Pedal edema and moderate mitral regurgitation: Stable at this time.  Will have an echocardiogram to assess mitral regurgitation issue. Essential hypertension: Blood pressure is elevated and I rechecked this.  Will initiate him on furosemide 20 mg daily.  I told him about salt intake issues.  He will be back in 2 weeks for complete blood work and at that time he will get a blood pressure log. Mild aortic stenosis: Medical management at this time. Mixed dyslipidemia: On lipid-lowering medications followed by primary care. Patient will be seen in follow-up appointment in 6 months or earlier if the patient has any concerns.    Medication Adjustments/Labs and Tests Ordered: Current medicines are reviewed at length with the patient today.  Concerns regarding medicines are outlined above.  No orders of the defined types were placed in this encounter.  No orders of the defined types were placed in this encounter.    No chief complaint on file.    History of Present Illness:    [name] [name] is a 87 y.o. male.  Patient has past medical history of essential hypertension, moderate aortic regurgitation, mild aortic stenosis and dyslipidemia.  He mentions to me that he has pedal edema and want to be seen today.  He was accompanied by his wife.  He actually brought her to our office and then requested to be  seen.  No chest pain orthopnea or PND.  No dyspnea on exertion.  At the time of my evaluation, the patient is alert awake oriented and in no distress.  Past Medical History:  Diagnosis Date   Abnormal ankle brachial index (ABI) 07/05/2019   ABI of the toe, at home health visit was abnormal for severe PAD-reading obtained left large toe which has callus formation.   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   BPH (benign prostatic hyperplasia) 04/23/2007   Qualifier: Diagnosis of  By: Marcelyn Ditty RN, Katy Fitch    Carotid atherosclerosis 01/21/2020   Chronic cough 07/05/2019   Chronic pain 04/29/2019   Degeneration of lumbar intervertebral disc 12/09/2019   Dyslipidemia 04/23/2007   Qualifier: Diagnosis of  By: Marcelyn Ditty RN, Katy Fitch    Essential hypertension 06/02/2008   Qualifier: Diagnosis of  By: Amador Cunas  MD, Janett Labella    Hypothyroidism 05/13/2019   Kidney stones    LAE (left atrial enlargement) 05/13/2019   Mild aortic stenosis 08/07/2021   Mixed dyslipidemia 08/07/2021   Moderate mitral regurgitation 08/07/2021   Neuropathy    s/p trauma to left lower ext (horse accident)   Osteoarthritis 04/23/2007   Qualifier: Diagnosis of  By: Marcelyn Ditty RN, Katy Fitch    Palpitations 02/04/2008   Qualifier: Diagnosis of  By: Amador Cunas  MD, Janett Labella    Sensorineural hearing loss (SNHL), bilateral 06/14/2016   TIA (transient ischemic attack)  05/13/2019   Tinnitus 02/28/2010   deaf left ear(no hearing)   Visual field defect 04/29/2019    Past Surgical History:  Procedure Laterality Date   APPENDECTOMY     CATARACT EXTRACTION Bilateral 04/2020   COLONOSCOPY     HERNIA REPAIR     left foot surgery for fx  Jul 30, 2011   LITHOTRIPSY  1992   cystoscopy with stent placement also done(1 stone fragment remains)   right shoulder replacement  Sep 28, 2009   TOTAL KNEE ARTHROPLASTY Right 08/30/2013   Procedure: RIGHT  TOTAL KNEE ARTHROPLASTY RIGHT ;  Surgeon: Loanne Drilling, MD;  Location: [facility];  Service: Orthopedics;  Laterality: Right;   TOTAL SHOULDER ARTHROPLASTY Left 02/02/2015   Procedure: LEFT TOTAL SHOULDER ARTHROPLASTY;  Surgeon: Francena Hanly, MD;  Location: [facility];  Service: Orthopedics;  Laterality: Left;   TRANSURETHRAL RESECTION OF PROSTATE  03/27/2012   Procedure: TRANSURETHRAL RESECTION OF THE PROSTATE WITH GYRUS INSTRUMENTS;  Surgeon: Garnett Farm, MD;  Location: [facility];  Service: Urology;;    Current Medications: Current Meds  Medication Sig   aspirin 81 MG chewable tablet Chew 81 mg by mouth daily.   celecoxib (CELEBREX) 200 MG capsule Take 1 capsule (200 mg total) by mouth daily.   cholecalciferol (VITAMIN D3) 25 MCG (1000 UNIT) tablet Take 1,000 Units by mouth daily.   Coenzyme Q10 (COQ10) 200 MG CAPS Take 200 mg by mouth at bedtime.   fexofenadine (ALLEGRA) 180 MG tablet Take 180 mg by mouth daily.   fluocinolone (SYNALAR) 0.01 % external solution Apply topically 2 (two) times daily. (Patient taking differently: Apply 1 Application topically as needed (rash).)   fluticasone (FLONASE) 50 MCG/ACT nasal spray Place 2 sprays into both nostrils daily.   gabapentin (NEURONTIN) 600 MG tablet Take 1 tablet (600 mg total) by mouth 2 (two) times daily.   Krill Oil 1000 MG CAPS Take 2,000 mg by mouth daily with breakfast.   levothyroxine (SYNTHROID) 50 MCG tablet Take 1 tablet (50 mcg total) by mouth daily before breakfast. Avoid meal at least 30 minutes after taking medication.   Multiple Vitamin (MULTIVITAMIN) tablet Take 1 tablet by mouth daily.   potassium citrate (UROCIT-K) 10 MEQ (1080 MG) SR tablet Take 2 tablets (20 mEq total) by mouth 2 (two) times daily with a meal.   Psyllium (METAMUCIL FIBER PO) Take 2 capsules by mouth daily with breakfast.   simvastatin (ZOCOR) 40 MG tablet Take 1 tablet (40 mg total) by mouth daily at 6 PM.   vitamin C (ASCORBIC ACID) 500 MG tablet Take 500 mg by  mouth at bedtime.   Zinc 20 MG CAPS Take 30 mg by mouth daily.     Allergies:   Patient has no known allergies.   Social History   Socioeconomic History   Marital status: Married    Spouse name: Not on file   Number of children: Not on file   Years of education: Not on file   Highest education level: 12th grade  Occupational History   Not on file  Tobacco Use   Smoking status: Never   Smokeless tobacco: Never  Vaping Use   Vaping Use: Never used  Substance and Sexual Activity   Alcohol use: No   Drug use: No   Sexual activity: Yes    Partners: Female  Other Topics Concern   Not on file  Social History Narrative   Marital status/children/pets: Married   Education/employment: retired   International aid/development worker of  Health   Financial Resource Strain: Low Risk  (10/13/2022)   Overall Financial Resource Strain (CARDIA)    Difficulty of Paying Living Expenses: Not very hard  Food Insecurity: No Food Insecurity (10/13/2022)   Hunger Vital Sign    Worried About Running Out of Food in the Last Year: Never true    Ran Out of Food in the Last Year: Never true  Transportation Needs: No Transportation Needs (10/13/2022)   PRAPARE - Administrator, Civil Service (Medical): No    Lack of Transportation (Non-Medical): No  Physical Activity: Sufficiently Active (10/13/2022)   Exercise Vital Sign    Days of Exercise per Week: 5 days    Minutes of Exercise per Session: 60 min  Stress: Stress Concern Present (10/13/2022)   [employer] of Occupational Health - Occupational Stress Questionnaire    Feeling of Stress : To some extent  Social Connections: Moderately Integrated (10/13/2022)   Social Connection and Isolation Panel [NHANES]    Frequency of Communication with Friends and Family: More than three times a week    Frequency of Social Gatherings with Friends and Family: Twice a week    Attends Religious Services: More than 4 times per year    Active Member of [employer] or  Organizations: No    Attends Banker Meetings: Never    Marital Status: Married     Family History: The patient's family history includes COPD in his sister; Diabetes in his brother and sister; Heart disease (age of onset: 34) in his mother; Stroke (age of onset: 58) in his father.  ROS:   Please see the history of present illness.    All other systems reviewed and are negative.  EKGs/Labs/Other Studies Reviewed:    The following studies were reviewed today: I discussed my findings with the patient at length including echo report   Recent Labs: 03/25/2022: TSH 2.78 12/25/2022: ALT 18; BUN 30; Creatinine, Ser 0.85; Hemoglobin 13.2; Platelets 205.0; Potassium 4.1; Sodium 140  Recent Lipid Panel    Component Value Date/Time   CHOL 143 12/14/2021 0830   TRIG 51 12/14/2021 0830   HDL 67 12/14/2021 0830   CHOLHDL 2.1 12/14/2021 0830   CHOLHDL 2.2 04/30/2019 0459   VLDL 7 04/30/2019 0459   LDLCALC 65 12/14/2021 0830   LDLDIRECT 109.8 06/05/2011 0959    Physical Exam:    VS:  BP (!) 166/68   Pulse 63   Ht 5\' 9"  (1.753 m)   Wt 180 lb (81.6 kg)   SpO2 97%   BMI 26.58 kg/m     Wt Readings from Last 3 Encounters:  01/21/23 180 lb (81.6 kg)  12/25/22 187 lb (84.8 kg)  12/20/22 180 lb (81.6 kg)     GEN: Patient is in no acute distress HEENT: Normal NECK: No JVD; No carotid bruits LYMPHATICS: No lymphadenopathy CARDIAC: Hear sounds regular, 2/6 systolic murmur at the apex. RESPIRATORY:  Clear to auscultation without rales, wheezing or rhonchi  ABDOMEN: Soft, non-tender, non-distended MUSCULOSKELETAL:  No edema; No deformity  SKIN: Warm and dry NEUROLOGIC:  Alert and oriented x 3 PSYCHIATRIC:  Normal affect   Signed, Garwin Brothers, MD  01/21/2023 9:44 AM    [facility]

## 2023-02-19 ENCOUNTER — Other Ambulatory Visit (HOSPITAL_BASED_OUTPATIENT_CLINIC_OR_DEPARTMENT_OTHER): Payer: PPO

## 2023-02-19 ENCOUNTER — Ambulatory Visit (HOSPITAL_BASED_OUTPATIENT_CLINIC_OR_DEPARTMENT_OTHER)
Admission: RE | Admit: 2023-02-19 | Discharge: 2023-02-19 | Disposition: A | Discharge status: Home / Self Care | Payer: PPO | Source: Ambulatory Visit | Attending: Cardiology | Admitting: Cardiology

## 2023-02-19 DIAGNOSIS — I340 Nonrheumatic mitral (valve) insufficiency: Secondary | ICD-10-CM | POA: Insufficient documentation

## 2023-02-19 LAB — ECHOCARDIOGRAM COMPLETE
AR max vel: 1.59 cm2
AV Area VTI: 1.52 cm2
AV Area mean vel: 1.47 cm2
AV Mean grad: 18.8 mmHg
AV Peak grad: 31.8 mmHg
Ao pk vel: 2.82 m/s
Area-P 1/2: 2.18 cm2
MV M vel: 6.71 m/s
MV Peak grad: 180.1 mmHg
P 1/2 time: 654 msec
S' Lateral: 3.2 cm

## 2023-02-20 LAB — COMPREHENSIVE METABOLIC PANEL
ALT: 18 IU/L (ref 0–44)
AST: 15 IU/L (ref 0–40)
Albumin/Globulin Ratio: 2.1 (ref 1.2–2.2)
Albumin: 4.4 g/dL (ref 3.7–4.7)
Alkaline Phosphatase: 71 IU/L (ref 44–121)
BUN/Creatinine Ratio: 38 — ABNORMAL HIGH (ref 10–24)
BUN: 36 mg/dL — ABNORMAL HIGH (ref 8–27)
Bilirubin Total: 0.6 mg/dL (ref 0.0–1.2)
CO2: 24 mmol/L (ref 20–29)
Calcium: 9 mg/dL (ref 8.6–10.2)
Chloride: 103 mmol/L (ref 96–106)
Creatinine, Ser: 0.96 mg/dL (ref 0.76–1.27)
Globulin, Total: 2.1 g/dL (ref 1.5–4.5)
Glucose: 105 mg/dL — ABNORMAL HIGH (ref 70–99)
Potassium: 3.9 mmol/L (ref 3.5–5.2)
Sodium: 141 mmol/L (ref 134–144)
Total Protein: 6.5 g/dL (ref 6.0–8.5)
eGFR: 78 mL/min/{1.73_m2} (ref >59)

## 2023-02-20 LAB — CBC
Hematocrit: 38.1 % (ref 37.5–51.0)
Hemoglobin: 12.6 g/dL — ABNORMAL LOW (ref 13.0–17.7)
MCH: 31 pg (ref 26.6–33.0)
MCHC: 33.1 g/dL (ref 31.5–35.7)
MCV: 94 fL (ref 79–97)
Platelets: 196 10*3/uL (ref 150–450)
RBC: 4.06 x10E6/uL — ABNORMAL LOW (ref 4.14–5.80)
RDW: 13.1 % (ref 11.6–15.4)
WBC: 6.4 10*3/uL (ref 3.4–10.8)

## 2023-02-20 LAB — LIPID PANEL
Chol/HDL Ratio: 2.7 ratio (ref 0.0–5.0)
Cholesterol, Total: 156 mg/dL (ref 100–199)
HDL: 57 mg/dL (ref >39)
LDL Chol Calc (NIH): 87 mg/dL (ref 0–99)
Triglycerides: 60 mg/dL (ref 0–149)
VLDL Cholesterol Cal: 12 mg/dL (ref 5–40)

## 2023-02-20 LAB — TSH: TSH: 3.96 u[IU]/mL (ref 0.450–4.500)

## 2023-02-28 ENCOUNTER — Ambulatory Visit (INDEPENDENT_AMBULATORY_CARE_PROVIDER_SITE_OTHER): Payer: PPO | Admitting: Family Medicine

## 2023-02-28 ENCOUNTER — Encounter: Payer: Self-pay | Admitting: Family Medicine

## 2023-02-28 VITALS — BP 134/62 | HR 62 | Temp 98.0°F | Ht 68.0 in | Wt 181.2 lb

## 2023-02-28 DIAGNOSIS — R351 Nocturia: Secondary | ICD-10-CM

## 2023-02-28 DIAGNOSIS — M150 Primary generalized (osteo)arthritis: Secondary | ICD-10-CM

## 2023-02-28 DIAGNOSIS — M51369 Other intervertebral disc degeneration, lumbar region without mention of lumbar back pain or lower extremity pain: Secondary | ICD-10-CM

## 2023-02-28 DIAGNOSIS — R600 Localized edema: Secondary | ICD-10-CM

## 2023-02-28 DIAGNOSIS — I10 Essential (primary) hypertension: Secondary | ICD-10-CM

## 2023-02-28 DIAGNOSIS — E034 Atrophy of thyroid (acquired): Secondary | ICD-10-CM

## 2023-02-28 DIAGNOSIS — I359 Nonrheumatic aortic valve disorder, unspecified: Secondary | ICD-10-CM

## 2023-02-28 DIAGNOSIS — G459 Transient cerebral ischemic attack, unspecified: Secondary | ICD-10-CM

## 2023-02-28 DIAGNOSIS — G629 Polyneuropathy, unspecified: Secondary | ICD-10-CM

## 2023-02-28 DIAGNOSIS — Z0000 Encounter for general adult medical examination without abnormal findings: Secondary | ICD-10-CM

## 2023-02-28 DIAGNOSIS — I350 Nonrheumatic aortic (valve) stenosis: Secondary | ICD-10-CM

## 2023-02-28 DIAGNOSIS — N401 Enlarged prostate with lower urinary tract symptoms: Secondary | ICD-10-CM | POA: Diagnosis not present

## 2023-02-28 DIAGNOSIS — E785 Hyperlipidemia, unspecified: Secondary | ICD-10-CM

## 2023-02-28 DIAGNOSIS — E673 Hypervitaminosis D: Secondary | ICD-10-CM

## 2023-02-28 DIAGNOSIS — M159 Polyosteoarthritis, unspecified: Secondary | ICD-10-CM

## 2023-02-28 DIAGNOSIS — M5136 Other intervertebral disc degeneration, lumbar region: Secondary | ICD-10-CM

## 2023-02-28 MED ORDER — CELECOXIB 200 MG PO CAPS: 200.0000 mg | ORAL_CAPSULE | Freq: Every day | ORAL | 1 refills | Status: DC

## 2023-02-28 MED ORDER — GABAPENTIN 600 MG PO TABS: 600.0000 mg | ORAL_TABLET | Freq: Two times a day (BID) | ORAL | 1 refills | Status: DC

## 2023-02-28 MED ORDER — POTASSIUM CITRATE ER 10 MEQ (1080 MG) PO TBCR: 20.0000 meq | EXTENDED_RELEASE_TABLET | Freq: Two times a day (BID) | ORAL | 1 refills | Status: DC

## 2023-02-28 MED ORDER — FLUTICASONE PROPIONATE 50 MCG/ACT NA SUSP: 2.0000 | Freq: Every day | NASAL | 6 refills | Status: DC

## 2023-02-28 MED ORDER — LEVOTHYROXINE SODIUM 50 MCG PO TABS: 50.0000 ug | ORAL_TABLET | Freq: Every day | ORAL | 3 refills | Status: DC

## 2023-02-28 MED ORDER — SIMVASTATIN 40 MG PO TABS: 40.0000 mg | ORAL_TABLET | Freq: Every day | ORAL | 3 refills | Status: DC

## 2023-02-28 NOTE — Progress Notes (Signed)
Patient Care Team    Relationship Specialty Notifications Start End  Natalia Leatherwood, DO PCP - General Family Medicine  01/19/19   Aris Lot, MD Consulting Physician Dermatology  01/19/20   Revankar, Aundra Dubin, MD Consulting Physician Cardiology  01/19/20   Gaylord Shih, Emerge  Specialist  01/19/20   Marlene Bast  Optometry  01/19/20    Chief Complaint  Patient presents with   Annual Exam  SUBJECTIVE HPI: [name] [name] is a 87 y.o. male present for annual  CPE and chronic medical condition follow-up   Health maintenance:  Colon cancer screening: No longer indicated greater than 75 Immunizations: Influenza UTD 07/2022, Tdap UTD 01/2016, PNA completed, Shingrix completed.  COVID vaccines received  Hypertension/hyperlipidemia: Pt reports compliance with Zocor 40 mg nightly.   Patient denies chest pain, shortness of breath, dizziness or lower extremity edema.  Pt takes a daily baby ASA. Pt is  prescribed statin. Diet: Monitors his diet Exercise: Routine exercise RF: Hypertension, hyperlipidemia, family history of heart disease and stroke  BPH/arthritis/neuropathy: Patient has a history of arthritis status post shoulder replacement and knee arthroplasty.  He also has a history of BPH/s/p TURP.  He states he takes Celebrex 200 mg daily and this medication has been extremely helpful for his BPH and his arthritis.Marland Kitchen  He did try the Flomax and it was not helpful.   He sustained a lower extremity fracture injury in which he has chronic neuropathy.  Celebrex 200 mg daily and gabapentin 600 mg twice daily is very helpful for pain and keeps him active.  ROS: See pertinent positives and negatives per HPI.  Patient Active Problem List   Diagnosis Date Noted   Pedal edema 01/21/2023   Arthralgia of left temporomandibular joint 01/07/2023   Otalgia of left ear 01/07/2023   Left nephrolithiasis associated with mild caliectasis-incidental finding 12/25/2022   Hypervitaminosis D 07/02/2022    Mild aortic stenosis 08/07/2021   Moderate mitral regurgitation 08/07/2021   Carotid atherosclerosis 01/21/2020   Arteriosclerosis of abdominal aorta (HCC) 01/17/2020   Degeneration of lumbar intervertebral disc 12/09/2019   Abnormal ankle brachial index (ABI) 07/05/2019   Chronic cough 07/05/2019   LAE (left atrial enlargement) 05/13/2019   Aortic valve calcification 05/13/2019   TIA (transient ischemic attack) 05/13/2019   Hypothyroidism 05/13/2019   Abnormal echocardiogram 05/13/2019   Visual field defect 04/29/2019   Neuropathy- chronic s/p trauma 01/20/2019   Sensorineural hearing loss (SNHL), bilateral 06/14/2016   Chronic swimmer's ear of both sides 06/14/2016   Tinnitus 02/28/2010   Essential hypertension 06/02/2008   Palpitations 02/04/2008   Dyslipidemia 04/23/2007   BPH (benign prostatic hyperplasia) 04/23/2007    Class: Present on Admission   Osteoarthritis 04/23/2007    Social History   Tobacco Use   Smoking status: Never   Smokeless tobacco: Never  Substance Use Topics   Alcohol use: No    Current Outpatient Medications:    aspirin 81 MG chewable tablet, Chew 81 mg by mouth daily., Disp: , Rfl:    cholecalciferol (VITAMIN D3) 25 MCG (1000 UNIT) tablet, Take 1,000 Units by mouth daily., Disp: , Rfl:    Coenzyme Q10 (COQ10) 200 MG CAPS, Take 200 mg by mouth at bedtime., Disp: , Rfl:    fexofenadine (ALLEGRA) 180 MG tablet, Take 180 mg by mouth daily., Disp: , Rfl:    fluocinolone (SYNALAR) 0.01 % external solution, Apply topically 2 (two) times daily. (Patient taking differently: Apply 1 Application topically as needed (rash).), Disp:  60 mL, Rfl: 1   furosemide (LASIX) 20 MG tablet, Take 1 tablet (20 mg total) by mouth daily., Disp: 90 tablet, Rfl: 3   Krill Oil 1000 MG CAPS, Take 2,000 mg by mouth daily with breakfast., Disp: , Rfl:    Multiple Vitamin (MULTIVITAMIN) tablet, Take 1 tablet by mouth daily., Disp: , Rfl:    Psyllium (METAMUCIL FIBER PO), Take 2  capsules by mouth daily with breakfast., Disp: , Rfl:    vitamin C (ASCORBIC ACID) 500 MG tablet, Take 500 mg by mouth at bedtime., Disp: , Rfl:    Zinc 20 MG CAPS, Take 30 mg by mouth daily., Disp: , Rfl:    celecoxib (CELEBREX) 200 MG capsule, Take 1 capsule (200 mg total) by mouth daily., Disp: 90 capsule, Rfl: 1   fluticasone (FLONASE) 50 MCG/ACT nasal spray, Place 2 sprays into both nostrils daily., Disp: 16 g, Rfl: 6   gabapentin (NEURONTIN) 600 MG tablet, Take 1 tablet (600 mg total) by mouth 2 (two) times daily., Disp: 180 tablet, Rfl: 1   levothyroxine (SYNTHROID) 50 MCG tablet, Take 1 tablet (50 mcg total) by mouth daily before breakfast. Avoid meal at least 30 minutes after taking medication., Disp: 90 tablet, Rfl: 3   potassium citrate (UROCIT-K) 10 MEQ (1080 MG) SR tablet, Take 2 tablets (20 mEq total) by mouth 2 (two) times daily with a meal., Disp: 360 tablet, Rfl: 1   simvastatin (ZOCOR) 40 MG tablet, Take 1 tablet (40 mg total) by mouth daily at 6 PM., Disp: 90 tablet, Rfl: 3  No Known Allergies  OBJECTIVE: BP 134/62   Pulse 62   Temp 98 F (36.7 C)   Ht 5\' 8"  (1.727 m)   Wt 181 lb 3.2 oz (82.2 kg)   SpO2 99%   BMI 27.55 kg/m  Physical Exam Constitutional:      General: He is not in acute distress.    Appearance: Normal appearance. He is not ill-appearing, toxic-appearing or diaphoretic.  HENT:     Head: Normocephalic and atraumatic.     Right Ear: Tympanic membrane, ear canal and external ear normal. There is no impacted cerumen.     Left Ear: Tympanic membrane, ear canal and external ear normal. There is no impacted cerumen.     Nose: Nose normal. No congestion or rhinorrhea.     Mouth/Throat:     Mouth: Mucous membranes are moist.     Pharynx: Oropharynx is clear. No oropharyngeal exudate or posterior oropharyngeal erythema.  Eyes:     General: No scleral icterus.       Right eye: No discharge.        Left eye: No discharge.     Extraocular Movements:  Extraocular movements intact.     Pupils: Pupils are equal, round, and reactive to light.  Cardiovascular:     Rate and Rhythm: Normal rate and regular rhythm.     Pulses: Normal pulses.     Heart sounds: Murmur heard.     No friction rub. No gallop.  Pulmonary:     Effort: Pulmonary effort is normal. No respiratory distress.     Breath sounds: Normal breath sounds. No stridor. No wheezing, rhonchi or rales.  Chest:     Chest wall: No tenderness.  Abdominal:     General: Abdomen is flat. Bowel sounds are normal. There is no distension.     Palpations: Abdomen is soft. There is no mass.     Tenderness: There is no abdominal tenderness.  There is no right CVA tenderness, left CVA tenderness, guarding or rebound.     Hernia: No hernia is present.  Musculoskeletal:        General: No swelling or tenderness. Normal range of motion.     Cervical back: Normal range of motion and neck supple.     Right lower leg: No edema.     Left lower leg: No edema.  Lymphadenopathy:     Cervical: No cervical adenopathy.  Skin:    General: Skin is warm and dry.     Coloration: Skin is not jaundiced.     Findings: No bruising, lesion or rash.  Neurological:     General: No focal deficit present.     Mental Status: He is alert and oriented to person, place, and time. Mental status is at baseline.     Cranial Nerves: No cranial nerve deficit.     Sensory: No sensory deficit.     Motor: No weakness.     Coordination: Coordination normal.     Gait: Gait normal.     Deep Tendon Reflexes: Reflexes normal.  Psychiatric:        Mood and Affect: Mood normal.        Behavior: Behavior normal.        Thought Content: Thought content normal.        Judgment: Judgment normal.    ASSESSMENT AND PLAN: DEONTREZ SLAVENS is a 87 y.o. male present for cpe and Chronic Conditions/illness Management Benign prostatic hyperplasia without lower urinary tract symptoms/nephrolithiasis/elevated PSA Stable Continue  celebrex. - flomax tried and reported not helpful.  - continue potassium citrate Has been referred to urology  Hypertension/edema/dyslipidemia/Essential hypertension/Overweight (BMI 25.0-29.9) Cardiology initially discontinued his losartan secondary to low blood pressures and fatigue.  Most recent visit they started Lasix 20 mg daily for edema with mildly elevated pressures. Reviewed recent echo 02/19/2023 with EF 60% and diastolic 1 dysfunction.  Mild mitral regurg, mild aortic regurg and mild stenosis. Continue Zocor 40 mg daily> [facility] - continue baby aspirin - low sodium diet and routine exercise.  Reviewed recent lipid panel collected by another provider 02/19/2023, cholesterol at goal.  Neuropathy- chronic s/p trauma Stable status post trauma to lower extremity. Continue gabapentin 600 mg twice daily  Primary osteoarthritis involving multiple joints Stable continue Celebrex  Hypothyroidism: Continue levo 50 mcg.  -Reviewed labs collected by another provider 02/19/2023 with normal TSH.  Abnormal ABI: Discussed signs and symptoms for him to monitor for decreased circulation.  At this time he would like to wait on any further testing.  Benign prostatic hyperplasia with nocturia - Ambulatory referral to Urology  Routine general medical examination at a health care facility Patient was encouraged to exercise greater than 150 minutes a week. Patient was encouraged to choose a diet filled with fresh fruits and vegetables, and lean meats. AVS provided to patient today for education/recommendation on gender specific health and safety maintenance. Health maintenance screenings up-to-date  Orders Placed This Encounter  Procedures   Ambulatory referral to Urology    Meds ordered this encounter  Medications   gabapentin (NEURONTIN) 600 MG tablet    Sig: Take 1 tablet (600 mg total) by mouth 2 (two) times daily.    Dispense:  180 tablet    Refill:  1    Hold until  pt request    potassium citrate (UROCIT-K) 10 MEQ (1080 MG) SR tablet    Sig: Take 2 tablets (20 mEq total) by mouth 2 (two)  times daily with a meal.    Dispense:  360 tablet    Refill:  1    Hold until  pt request   simvastatin (ZOCOR) 40 MG tablet    Sig: Take 1 tablet (40 mg total) by mouth daily at 6 PM.    Dispense:  90 tablet    Refill:  3    Hold until  pt request   celecoxib (CELEBREX) 200 MG capsule    Sig: Take 1 capsule (200 mg total) by mouth daily.    Dispense:  90 capsule    Refill:  1    Hold until  pt request   fluticasone (FLONASE) 50 MCG/ACT nasal spray    Sig: Place 2 sprays into both nostrils daily.    Dispense:  16 g    Refill:  6    Hold until  pt request   levothyroxine (SYNTHROID) 50 MCG tablet    Sig: Take 1 tablet (50 mcg total) by mouth daily before breakfast. Avoid meal at least 30 minutes after taking medication.    Dispense:  90 tablet    Refill:  3    Hold until  pt request    Referral Orders         Ambulatory referral to Urology        [facility], DO 02/28/2023

## 2023-02-28 NOTE — Patient Instructions (Addendum)
Return in about 24 weeks (around 08/15/2023) for Routine chronic condition follow-up.        Great to see you today.  I have refilled the medication(s) we provide.   If labs were collected, we will inform you of lab results once received either by echart message or telephone call.   - echart message- for normal results that have been seen by the patient already.   - telephone call: abnormal results or if patient has not viewed results in their echart.     

## 2023-03-12 ENCOUNTER — Ambulatory Visit (INDEPENDENT_AMBULATORY_CARE_PROVIDER_SITE_OTHER): Payer: PPO | Admitting: Urology

## 2023-03-12 ENCOUNTER — Encounter: Payer: Self-pay | Admitting: Urology

## 2023-03-12 VITALS — BP 133/70 | HR 71 | Ht 68.0 in | Wt 180.0 lb

## 2023-03-12 DIAGNOSIS — N138 Other obstructive and reflux uropathy: Secondary | ICD-10-CM | POA: Diagnosis not present

## 2023-03-12 DIAGNOSIS — R972 Elevated prostate specific antigen [PSA]: Secondary | ICD-10-CM

## 2023-03-12 DIAGNOSIS — N401 Enlarged prostate with lower urinary tract symptoms: Secondary | ICD-10-CM

## 2023-03-12 DIAGNOSIS — N200 Calculus of kidney: Secondary | ICD-10-CM | POA: Diagnosis not present

## 2023-03-12 HISTORY — DX: Elevated prostate specific antigen (PSA): R97.20

## 2023-03-12 LAB — MICROSCOPIC EXAMINATION
Bacteria, UA: NONE SEEN
Cast Type: NONE SEEN
Casts: NONE SEEN /lpf
Crystal Type: NONE SEEN
Crystals: NONE SEEN
Epithelial Cells (non renal): NONE SEEN /hpf (ref 0–10)
Renal Epithel, UA: NONE SEEN /hpf
Trichomonas, UA: NONE SEEN
Yeast, UA: NONE SEEN

## 2023-03-12 LAB — URINALYSIS, ROUTINE W REFLEX MICROSCOPIC
Bilirubin, UA: NEGATIVE
Glucose, UA: NEGATIVE
Nitrite, UA: NEGATIVE
Protein,UA: NEGATIVE
RBC, UA: NEGATIVE
Specific Gravity, UA: 1.02 (ref 1.005–1.030)
Urobilinogen, Ur: 0.2 mg/dL (ref 0.2–1.0)
pH, UA: 6 (ref 5.0–7.5)

## 2023-03-12 NOTE — Progress Notes (Signed)
Assessment: 1. BPH with obstruction/lower urinary tract symptoms   2. Nephrolithiasis   3. Elevated PSA     Plan: I personally reviewed the patient's chart including provider notes, lab and imaging results. I reviewed records from [facility]. He does not wish to try any alternative medical therapy for his lower urinary tract symptoms. Continue potassium citrate for stone prevention. Return to office in 6 months with KUB   Chief Complaint:  Chief Complaint  Patient presents with   Benign Prostatic Hypertrophy    History of Present Illness:  [name] [name] is a 87 y.o. male who is seen for evaluation of BPH with lower urinary tract symptoms and nephrolithiasis.  He was previously followed at [facility] in [facility] and was last seen by me in 2/22. He has a history of BPH with obstruction and nephrolithiasis.  He is status post a TURP in 2013.  He had recurrence of his lower urinary tract symptoms with frequency, urgency, nocturia x 2, and incomplete emptying.  He had previously tried Flomax without improvement.  He has been using Celebrex at night with reported improvement in his urinary symptoms.  Evaluation with cystoscopy in 2019 showed enlargement of the prostate and small bladder calculi.  PVR from 6/21 was 245 mL.  He was given a trial of alfuzosin in June 2021 but discontinued the medication due to worsening of his symptoms.  He reported that his urinary symptoms were overall stable in January 2022.  He continued to have frequency and urgency primarily in the morning and nocturia x 2.  PVR at that time was 167 mL.  He was given a trial of Myrbetriq 25 mg daily.  He did not see any improvement in his symptoms with Myrbetriq and discontinued the medication after several weeks.  At the time of his visit in 2/22, he was not taking any medication for his lower urinary tract symptoms. He was seen by Dr. Sherryl Barters in 2/24.  He did not want to pursue any additional  medical therapy at that time. He report that his lower urinary tract symptoms are currently stable.  He continues to have frequency and urgency, primarily in the morning.  He also has nocturia x 2. IPSS = 23 today.  He has a history of nephrolithiasis and is status post lithotripsy in August 2008.  He has been on potassium citrate for a number of years.  CT imaging from 2019 showed a left renal calculus.  KUB from 6/21 showed calcifications in the left lower renal shadow measuring 15 x 9 and 7 x 10 mm respectively. KUB from 2/24 showed stable calcifications in the left lower renal shadow. CT chest from 12/20/2022 showed multiple renal calculi in the lower pole left kidney with associated mild caliectasis. He has continued on potassium citrate. He is not having any flank pain.  He has a history of elevated PSA with a prior negative biopsy. PSA results: 3.82 11/17 4.61 10/18 25% free 6.31 5/19 36% free 5.02 6/19 21% free 4.88 1/20 26% free 5.28 7/20 20% free 4.24 6/21 23% free    Past Medical History:  Past Medical History:  Diagnosis Date   Abnormal ankle brachial index (ABI) 07/05/2019   ABI of the toe, at home health visit was abnormal for severe PAD-reading obtained left large toe which has callus formation.   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   BPH (benign prostatic hyperplasia) 04/23/2007  Qualifier: Diagnosis of  By: Marcelyn Ditty RN, Katy Fitch    Carotid atherosclerosis 01/21/2020   Chronic cough 07/05/2019   Chronic pain 04/29/2019   Degeneration of lumbar intervertebral disc 12/09/2019   Dyslipidemia 04/23/2007   Qualifier: Diagnosis of  By: Marcelyn Ditty RN, Katy Fitch    Essential hypertension 06/02/2008   Qualifier: Diagnosis of  By: Amador Cunas  MD, Janett Labella    Hypothyroidism 05/13/2019   Kidney stones    LAE (left atrial enlargement) 05/13/2019   Mild aortic stenosis 08/07/2021   Mixed  dyslipidemia 08/07/2021   Moderate mitral regurgitation 08/07/2021   Neuropathy    s/p trauma to left lower ext (horse accident)   Osteoarthritis 04/23/2007   Qualifier: Diagnosis of  By: Marcelyn Ditty RN, Katy Fitch    Palpitations 02/04/2008   Qualifier: Diagnosis of  By: Amador Cunas  MD, Janett Labella    Sensorineural hearing loss (SNHL), bilateral 06/14/2016   TIA (transient ischemic attack) 05/13/2019   Tinnitus 02/28/2010   deaf left ear(no hearing)   Visual field defect 04/29/2019    Past Surgical History:  Past Surgical History:  Procedure Laterality Date   APPENDECTOMY     CATARACT EXTRACTION Bilateral 04/2020   COLONOSCOPY     HERNIA REPAIR     left foot surgery for fx  Jul 30, 2011   LITHOTRIPSY  1992   cystoscopy with stent placement also done(1 stone fragment remains)   right shoulder replacement  Sep 28, 2009   TOTAL KNEE ARTHROPLASTY Right 08/30/2013   Procedure: RIGHT TOTAL KNEE ARTHROPLASTY RIGHT ;  Surgeon: Loanne Drilling, MD;  Location: [facility];  Service: Orthopedics;  Laterality: Right;   TOTAL SHOULDER ARTHROPLASTY Left 02/02/2015   Procedure: LEFT TOTAL SHOULDER ARTHROPLASTY;  Surgeon: Francena Hanly, MD;  Location: [facility];  Service: Orthopedics;  Laterality: Left;   TRANSURETHRAL RESECTION OF PROSTATE  03/27/2012   Procedure: TRANSURETHRAL RESECTION OF THE PROSTATE WITH GYRUS INSTRUMENTS;  Surgeon: Garnett Farm, MD;  Location: [facility];  Service: Urology;;    Allergies:  No Known Allergies  Family History:  Family History  Problem Relation Age of Onset   Heart disease Mother 67       MI   Stroke Father 61   COPD Sister    Diabetes Sister    Diabetes Brother     Social History:  Social History   Tobacco Use   Smoking status: Never   Smokeless tobacco: Never  Vaping Use   Vaping Use: Never used  Substance Use Topics   Alcohol use: No   Drug use: No    Review of symptoms:  Constitutional:  Negative for unexplained weight loss, night sweats, fever,  chills ENT:  Negative for nose bleeds, sinus pain, painful swallowing CV:  Negative for chest pain, shortness of breath, exercise intolerance, palpitations, loss of consciousness Resp:  Negative for cough, wheezing, shortness of breath GI:  Negative for nausea, vomiting, diarrhea, bloody stools GU:  Positives noted in HPI; otherwise negative for gross hematuria, dysuria, urinary incontinence Neuro:  Negative for seizures, poor balance, limb weakness, slurred speech Psych:  Negative for lack of energy, depression, anxiety Endocrine:  Negative for polydipsia, polyuria, symptoms of hypoglycemia (dizziness, hunger, sweating) Hematologic:  Negative for anemia, purpura, petechia, prolonged or excessive bleeding, use of anticoagulants  Allergic:  Negative for difficulty breathing or choking as a result of exposure to anything; no shellfish allergy; no allergic response (rash/itch) to materials, foods  Physical exam: BP 133/70   Pulse 71  Ht 5\' 8"  (1.727 m)   Wt 180 lb (81.6 kg)   BMI 27.37 kg/m  GENERAL APPEARANCE:  Well appearing, well developed, well nourished, NAD HEENT: Atraumatic, Normocephalic, oropharynx clear. NECK: Supple without lymphadenopathy or thyromegaly. LUNGS: Clear to auscultation bilaterally. HEART: Regular Rate and Rhythm without murmurs, gallops, or rubs. ABDOMEN: Soft, non-tender, No Masses. EXTREMITIES: Moves all extremities well.  Without clubbing, cyanosis, or edema. NEUROLOGIC:  Alert and oriented x 3, normal gait, CN II-XII grossly intact.  MENTAL STATUS:  Appropriate. BACK:  Non-tender to palpation.  No CVAT SKIN:  Warm, dry and intact.    Results: U/A:  0-5 WBC, 0-2 RBC

## 2023-04-15 DIAGNOSIS — N200 Calculus of kidney: Secondary | ICD-10-CM | POA: Insufficient documentation

## 2023-04-23 ENCOUNTER — Ambulatory Visit: Payer: PPO | Attending: Cardiology | Admitting: Cardiology

## 2023-04-23 ENCOUNTER — Encounter: Payer: Self-pay | Admitting: Cardiology

## 2023-04-23 ENCOUNTER — Encounter (HOSPITAL_COMMUNITY): Payer: Self-pay

## 2023-04-23 VITALS — BP 128/70 | HR 64 | Ht 70.0 in | Wt 179.0 lb

## 2023-04-23 DIAGNOSIS — R079 Chest pain, unspecified: Secondary | ICD-10-CM

## 2023-04-23 DIAGNOSIS — I10 Essential (primary) hypertension: Secondary | ICD-10-CM | POA: Diagnosis not present

## 2023-04-23 DIAGNOSIS — E782 Mixed hyperlipidemia: Secondary | ICD-10-CM

## 2023-04-23 DIAGNOSIS — I340 Nonrheumatic mitral (valve) insufficiency: Secondary | ICD-10-CM

## 2023-04-23 DIAGNOSIS — I350 Nonrheumatic aortic (valve) stenosis: Secondary | ICD-10-CM | POA: Diagnosis not present

## 2023-04-23 NOTE — Patient Instructions (Addendum)
Medication Instructions:  Your physician recommends that you continue on your current medications as directed. Please refer to the Current Medication list given to you today.  *If you need a refill on your cardiac medications before your next appointment, please call your pharmacy*  Lab Work: None ordered today.  Testing/Procedures: Your physician has requested that you have a lexiscan myoview. For further information please visit [email or web address] Please follow instruction sheet, as given.   Follow-Up: At [facility], you and your health needs are our priority.  As part of our continuing mission to provide you with exceptional heart care, we have created designated Provider Care Teams.  These Care Teams include your primary Cardiologist (physician) and Advanced Practice Providers (APPs -  Physician Assistants and Nurse Practitioners) who all work together to provide you with the care you need, when you need it.  Your next appointment:   9 month(s)  The format for your next appointment:   In Person  Provider:   Belva Crome, MD{  Other Instructions  Lexiscan Myoview Instructions  Please arrive 15 minutes prior to your appointment time for registration and insurance purposes.  The test will take approximately 3 to 4 hours to complete; you may bring reading material.  If someone comes with you to your appointment, they will need to remain in the main lobby due to limited space in the testing area. **If you are pregnant or breastfeeding, please notify the nuclear lab prior to your appointment**  How to prepare for your Myocardial Perfusion Test: Do not eat or drink 3 hours prior to your test, except you may have water. Do not consume products containing caffeine (regular or decaffeinated) 12 hours prior to your test. (ex: coffee, chocolate, sodas, tea). Do bring a list of your current medications with you.  If not listed below, you may take your medications as normal. Do wear  comfortable clothes (no dresses or overalls) and walking shoes, tennis shoes preferred (No heels or open toe shoes are allowed). Do NOT wear cologne, perfume, aftershave, or lotions (deodorant is allowed). If these instructions are not followed, your test will have to be rescheduled.  Please report to [address], [address] for your test.  If you have questions or concerns about your appointment, you can call the Nuclear Lab at [phone].  If you cannot keep your appointment, please provide 24 hours notification to the Nuclear Lab, to avoid a possible $50 charge to your account.

## 2023-04-23 NOTE — Progress Notes (Signed)
Cardiology Office Note:    Date:  04/23/2023   ID:  [name], DOB 1936/06/05, MRN [id number]  PCP:  Natalia Leatherwood, DO  Cardiologist:  Garwin Brothers, MD   Referring MD: Natalia Leatherwood, DO    ASSESSMENT:    1. Chest pain of uncertain etiology   2. Mild aortic stenosis   3. Essential hypertension   4. Moderate mitral regurgitation   5. Mixed dyslipidemia    PLAN:    In order of problems listed above:  Primary prevention stressed with the patient.  Importance of compliance with diet medication stressed and patient verbalized standing. Chest pain: Atypical in nature but in view of patient's concerns we will do a Lexiscan sestamibi.  He is agreeable. Mild aortic stenosis: Echo report discussed with the patient at length and questions were answered to satisfaction. Mixed dyslipidemia: On lipid-lowering medications.  Lipids reviewed and found to be 5-10 discussed with patient Essential hypertension: Blood pressure stable hide diabetes emphasized. Mild mitral regurgitation: Stable.  Echo report reviewed. Patient will be seen in follow-up appointment in 6 months or earlier if the patient has any concerns.    Medication Adjustments/Labs and Tests Ordered: Current medicines are reviewed at length with the patient today.  Concerns regarding medicines are outlined above.  No orders of the defined types were placed in this encounter.  No orders of the defined types were placed in this encounter.    No chief complaint on file.    History of Present Illness:    [name] [name] is a 87 y.o. male.  Patient has past medical history of atherosclerotic vascular disease, mild aortic stenosis, mild to moderate mitral regurgitation, essential hypertension and mixed dyslipidemia.  He takes care of activities of daily living and leads a sedentary lifestyle.  His wife mentions to me that he occasionally has chest discomfort.  This is not related to exertion.  No radiation to the  neck or to the arms.  He takes a soda and feels better.  Past Medical History:  Diagnosis Date   Abnormal ankle brachial index (ABI) 07/05/2019   ABI of the toe, at home health visit was abnormal for severe PAD-reading obtained left large toe which has callus formation.   Abnormal echocardiogram 05/13/2019   Aortic valve calcification 05/13/2019   Arteriosclerosis of abdominal aorta (HCC) 01/17/2020   Arthralgia of left temporomandibular joint 01/07/2023   BPH (benign prostatic hyperplasia) 04/23/2007   Qualifier: Diagnosis of  By: Marcelyn Ditty RN, Katy Fitch    BPH with obstruction/lower urinary tract symptoms 04/23/2007   Qualifier: Diagnosis of   By: Marcelyn Ditty RN, Katy Fitch        Carotid atherosclerosis 01/21/2020   Chronic cough 07/05/2019   Chronic swimmer's ear of both sides 06/14/2016   Degeneration of lumbar intervertebral disc 12/09/2019   Dyslipidemia 04/23/2007   Qualifier: Diagnosis of  By: Maryan Char    Elevated PSA 03/12/2023   Essential hypertension 06/02/2008   Qualifier: Diagnosis of  By: Amador Cunas  MD, Janett Labella    Hypervitaminosis D 07/02/2022   Hypothyroidism 05/13/2019   Kidney stones    LAE (left atrial enlargement) 05/13/2019   Mild aortic stenosis 08/07/2021   Moderate mitral regurgitation 08/07/2021   Nephrolithiasis 12/25/2022   Neuropathy    s/p trauma to left lower ext (horse accident)   Osteoarthritis 04/23/2007   Qualifier: Diagnosis of  By: Marcelyn Ditty RN, Katy Fitch    Otalgia of left ear 01/07/2023   Palpitations  02/04/2008   Qualifier: Diagnosis of  By: Amador Cunas  MD, Janett Labella    Pedal edema 01/21/2023   Sensorineural hearing loss (SNHL), bilateral 06/14/2016   TIA (transient ischemic attack) 05/13/2019   Tinnitus 02/28/2010   deaf left ear(no hearing)   Visual field defect 04/29/2019    Past Surgical History:  Procedure Laterality Date   APPENDECTOMY     CATARACT EXTRACTION Bilateral 04/2020   COLONOSCOPY     HERNIA REPAIR     left foot  surgery for fx  Jul 30, 2011   LITHOTRIPSY  1992   cystoscopy with stent placement also done(1 stone fragment remains)   right shoulder replacement  Sep 28, 2009   TOTAL KNEE ARTHROPLASTY Right 08/30/2013   Procedure: RIGHT TOTAL KNEE ARTHROPLASTY RIGHT ;  Surgeon: Loanne Drilling, MD;  Location: [facility];  Service: Orthopedics;  Laterality: Right;   TOTAL SHOULDER ARTHROPLASTY Left 02/02/2015   Procedure: LEFT TOTAL SHOULDER ARTHROPLASTY;  Surgeon: Francena Hanly, MD;  Location: [facility];  Service: Orthopedics;  Laterality: Left;   TRANSURETHRAL RESECTION OF PROSTATE  03/27/2012   Procedure: TRANSURETHRAL RESECTION OF THE PROSTATE WITH GYRUS INSTRUMENTS;  Surgeon: Garnett Farm, MD;  Location: [facility];  Service: Urology;;    Current Medications: Current Meds  Medication Sig   aspirin 81 MG chewable tablet Chew 81 mg by mouth daily.   celecoxib (CELEBREX) 200 MG capsule Take 1 capsule (200 mg total) by mouth daily.   cholecalciferol (VITAMIN D3) 25 MCG (1000 UNIT) tablet Take 1,000 Units by mouth daily.   Coenzyme Q10 (COQ10) 200 MG CAPS Take 200 mg by mouth at bedtime.   fexofenadine (ALLEGRA) 180 MG tablet Take 180 mg by mouth daily.   fluocinolone (SYNALAR) 0.01 % external solution Apply topically 2 (two) times daily.   fluticasone (FLONASE) 50 MCG/ACT nasal spray Place 2 sprays into both nostrils daily.   furosemide (LASIX) 20 MG tablet Take 20 mg by mouth daily.   gabapentin (NEURONTIN) 600 MG tablet Take 1 tablet (600 mg total) by mouth 2 (two) times daily.   Krill Oil 1000 MG CAPS Take 2,000 mg by mouth daily with breakfast.   levothyroxine (SYNTHROID) 50 MCG tablet Take 1 tablet (50 mcg total) by mouth daily before breakfast. Avoid meal at least 30 minutes after taking medication.   Multiple Vitamin (MULTIVITAMIN) tablet Take 1 tablet by mouth daily.   potassium citrate (UROCIT-K) 10 MEQ (1080 MG) SR tablet Take 2 tablets (20 mEq total) by mouth 2 (two) times daily with a meal.   Psyllium  (METAMUCIL FIBER PO) Take 2 capsules by mouth daily with breakfast.   simvastatin (ZOCOR) 40 MG tablet Take 1 tablet (40 mg total) by mouth daily at 6 PM.   vitamin C (ASCORBIC ACID) 500 MG tablet Take 500 mg by mouth at bedtime.   Zinc 20 MG CAPS Take 30 mg by mouth daily.     Allergies:   Patient has no known allergies.   Social History   Socioeconomic History   Marital status: Married    Spouse name: Not on file   Number of children: Not on file   Years of education: Not on file   Highest education level: 12th grade  Occupational History   Not on file  Tobacco Use   Smoking status: Never   Smokeless tobacco: Never  Vaping Use   Vaping status: Never Used  Substance and Sexual Activity   Alcohol use: No   Drug use: No  Sexual activity: Yes    Partners: Female  Other Topics Concern   Not on file  Social History Narrative   Marital status/children/pets: Married   Education/employment: retired   International aid/development worker of Corporate investment banker Strain: Low Risk  (10/13/2022)   Overall Financial Resource Strain (CARDIA)    Difficulty of Paying Living Expenses: Not very hard  Food Insecurity: No Food Insecurity (10/13/2022)   Hunger Vital Sign    Worried About Running Out of Food in the Last Year: Never true    Ran Out of Food in the Last Year: Never true  Transportation Needs: No Transportation Needs (10/13/2022)   PRAPARE - Administrator, Civil Service (Medical): No    Lack of Transportation (Non-Medical): No  Physical Activity: Sufficiently Active (10/13/2022)   Exercise Vital Sign    Days of Exercise per Week: 5 days    Minutes of Exercise per Session: 60 min  Stress: Stress Concern Present (10/13/2022)   [employer] of Occupational Health - Occupational Stress Questionnaire    Feeling of Stress : To some extent  Social Connections: Moderately Integrated (10/13/2022)   Social Connection and Isolation Panel [NHANES]    Frequency of Communication  with Friends and Family: More than three times a week    Frequency of Social Gatherings with Friends and Family: Twice a week    Attends Religious Services: More than 4 times per year    Active Member of [employer] or Organizations: No    Attends Banker Meetings: Never    Marital Status: Married     Family History: The patient's family history includes COPD in his sister; Diabetes in his brother and sister; Heart disease (age of onset: 53) in his mother; Stroke (age of onset: 9) in his father.  ROS:   Please see the history of present illness.    All other systems reviewed and are negative.  EKGs/Labs/Other Studies Reviewed:    The following studies were reviewed today: I discussed my findings with patient at length   Recent Labs: 02/19/2023: ALT 18; BUN 36; Creatinine, Ser 0.96; Hemoglobin 12.6; Platelets 196; Potassium 3.9; Sodium 141; TSH 3.960  Recent Lipid Panel    Component Value Date/Time   CHOL 156 02/19/2023 0822   TRIG 60 02/19/2023 0822   HDL 57 02/19/2023 0822   CHOLHDL 2.7 02/19/2023 0822   CHOLHDL 2.2 04/30/2019 0459   VLDL 7 04/30/2019 0459   LDLCALC 87 02/19/2023 0822   LDLDIRECT 109.8 06/05/2011 0959    Physical Exam:    VS:  BP 128/70   Pulse 64   Ht 5\' 10"  (1.778 m)   Wt 179 lb (81.2 kg)   SpO2 95%   BMI 25.68 kg/m     Wt Readings from Last 3 Encounters:  04/23/23 179 lb (81.2 kg)  03/12/23 180 lb (81.6 kg)  02/28/23 181 lb 3.2 oz (82.2 kg)     GEN: Patient is in no acute distress HEENT: Normal NECK: No JVD; No carotid bruits LYMPHATICS: No lymphadenopathy CARDIAC: Hear sounds regular, 2/6 systolic murmur at the apex. RESPIRATORY:  Clear to auscultation without rales, wheezing or rhonchi  ABDOMEN: Soft, non-tender, non-distended MUSCULOSKELETAL:  No edema; No deformity  SKIN: Warm and dry NEUROLOGIC:  Alert and oriented x 3 PSYCHIATRIC:  Normal affect   Signed, Garwin Brothers, MD  04/23/2023 10:34 AM    [facility]

## 2023-04-25 ENCOUNTER — Other Ambulatory Visit (HOSPITAL_COMMUNITY): Payer: Self-pay | Admitting: Cardiology

## 2023-04-25 ENCOUNTER — Encounter (HOSPITAL_COMMUNITY): Payer: Self-pay

## 2023-04-25 ENCOUNTER — Ambulatory Visit (HOSPITAL_COMMUNITY): Payer: PPO | Attending: Cardiology

## 2023-04-25 ENCOUNTER — Ambulatory Visit (HOSPITAL_COMMUNITY): Payer: PPO

## 2023-04-25 DIAGNOSIS — R0609 Other forms of dyspnea: Secondary | ICD-10-CM | POA: Insufficient documentation

## 2023-04-25 DIAGNOSIS — R079 Chest pain, unspecified: Secondary | ICD-10-CM | POA: Insufficient documentation

## 2023-04-25 LAB — MYOCARDIAL PERFUSION IMAGING
LV dias vol: 99 mL (ref 62–150)
LV sys vol: 49 mL
Nuc Stress EF: 50 %
Peak HR: 78 {beats}/min
Rest BP: 69 mmHg
Rest HR: 1.6 {beats}/min
Rest Nuclear Isotope Dose: 11 mCi
SDS: 0
SRS: 0
SSS: 0
Stress Nuclear Isotope Dose: 31.5 mCi
TID: 1.23

## 2023-04-25 MED ORDER — TECHNETIUM TC 99M SESTAMIBI GENERIC - CARDIOLITE
31.5000 | Freq: Once | INTRAVENOUS | Status: AC | PRN
  Administered 2023-04-25: 31.5 via INTRAVENOUS

## 2023-04-25 MED ORDER — REGADENOSON 0.4 MG/5ML IV SOLN
0.4000 mg | Freq: Once | INTRAVENOUS | Status: AC
Start: 2023-04-25 — End: 2023-04-25
  Administered 2023-04-25: 0.4 mg via INTRAVENOUS

## 2023-04-25 MED ORDER — TECHNETIUM TC 99M SESTAMIBI GENERIC - CARDIOLITE
11.0000 | Freq: Once | INTRAVENOUS | Status: AC | PRN
  Administered 2023-04-25: 11 via INTRAVENOUS

## 2023-05-02 ENCOUNTER — Ambulatory Visit (INDEPENDENT_AMBULATORY_CARE_PROVIDER_SITE_OTHER): Payer: PPO

## 2023-05-02 DIAGNOSIS — Z23 Encounter for immunization: Secondary | ICD-10-CM

## 2023-05-14 NOTE — Addendum Note (Signed)
Addended by: Filomena Jungling on: 05/14/2023 11:53 AM   Modules accepted: Orders

## 2023-05-20 ENCOUNTER — Encounter: Payer: Self-pay | Admitting: Family Medicine

## 2023-08-20 ENCOUNTER — Ambulatory Visit: Payer: PPO | Admitting: Family Medicine

## 2023-08-20 NOTE — Progress Notes (Unsigned)
Patient Care Team    Relationship Specialty Notifications Start End  Natalia Leatherwood, DO PCP - General Family Medicine  01/19/19   Aris Lot, MD Consulting Physician Dermatology  01/19/20   Revankar, Aundra Dubin, MD Consulting Physician Cardiology  01/19/20   Gaylord Shih, Emerge  Specialist  01/19/20   Marlene Bast  Optometry  01/19/20    No chief complaint on file. SUBJECTIVE HPI: [name] [name] is a 87 y.o. male present for chronic medical condition management.  All past medical history, surgical history, allergies, family history, immunizations and social history was obtained from the patient today and entered into the electronic medical record.   Hypertension/hyperlipidemia: Pt reports compliance with Zocor 40 mg nightly.   Patient denies chest pain, shortness of breath, dizziness or lower extremity edema.  Pt takes a daily baby ASA. Pt is  prescribed statin. Diet: Monitors his diet Exercise: Routine exercise RF: Hypertension, hyperlipidemia, family history of heart disease and stroke  BPH/arthritis/neuropathy: Patient has a history of arthritis status post shoulder replacement and knee arthroplasty.  He also has a history of BPH/s/p TURP.  He states he takes Celebrex 200 mg daily and this medication has been extremely helpful for his BPH and his arthritis.Marland Kitchen  He did try the Flomax and it was not helpful.   He sustained a lower extremity fracture injury in which he has chronic neuropathy.   Celebrex 200 mg daily and gabapentin 600 mg twice daily as allowed him to stay active and is helpful for his pain.  ROS: See pertinent positives and negatives per HPI.  Patient Active Problem List   Diagnosis Date Noted   Kidney stones    Elevated PSA 03/12/2023   Pedal edema 01/21/2023   Arthralgia of left temporomandibular joint 01/07/2023   Otalgia of left ear 01/07/2023   Nephrolithiasis 12/25/2022   Hypervitaminosis D 07/02/2022   Mixed dyslipidemia 08/07/2021   Mild aortic  stenosis 08/07/2021   Moderate mitral regurgitation 08/07/2021   Carotid atherosclerosis 01/21/2020   Arteriosclerosis of abdominal aorta (HCC) 01/17/2020   Degeneration of lumbar intervertebral disc 12/09/2019   Abnormal ankle brachial index (ABI) 07/05/2019   Chronic cough 07/05/2019   LAE (left atrial enlargement) 05/13/2019   Aortic valve calcification 05/13/2019   TIA (transient ischemic attack) 05/13/2019   Hypothyroidism 05/13/2019   Abnormal echocardiogram 05/13/2019   Visual field defect 04/29/2019   Neuropathy- chronic s/p trauma 01/20/2019   Sensorineural hearing loss (SNHL), bilateral 06/14/2016   Chronic swimmer's ear of both sides 06/14/2016   Tinnitus 02/28/2010   Essential hypertension 06/02/2008   Palpitations 02/04/2008   Dyslipidemia 04/23/2007   BPH with obstruction/lower urinary tract symptoms 04/23/2007    Class: Present on Admission   Osteoarthritis 04/23/2007   BPH (benign prostatic hyperplasia) 04/23/2007    Social History   Tobacco Use   Smoking status: Never   Smokeless tobacco: Never  Substance Use Topics   Alcohol use: No    Current Outpatient Medications:    aspirin 81 MG chewable tablet, Chew 81 mg by mouth daily., Disp: , Rfl:    celecoxib (CELEBREX) 200 MG capsule, Take 1 capsule (200 mg total) by mouth daily., Disp: 90 capsule, Rfl: 1   cholecalciferol (VITAMIN D3) 25 MCG (1000 UNIT) tablet, Take 1,000 Units by mouth daily., Disp: , Rfl:    Coenzyme Q10 (COQ10) 200 MG CAPS, Take 200 mg by mouth at bedtime., Disp: , Rfl:    fexofenadine (ALLEGRA) 180 MG tablet, Take 180 mg  by mouth daily., Disp: , Rfl:    fluocinolone (SYNALAR) 0.01 % external solution, Apply topically 2 (two) times daily., Disp: 60 mL, Rfl: 1   fluticasone (FLONASE) 50 MCG/ACT nasal spray, Place 2 sprays into both nostrils daily., Disp: 16 g, Rfl: 6   furosemide (LASIX) 20 MG tablet, Take 20 mg by mouth daily., Disp: , Rfl:    gabapentin (NEURONTIN) 600 MG tablet, Take 1  tablet (600 mg total) by mouth 2 (two) times daily., Disp: 180 tablet, Rfl: 1   Krill Oil 1000 MG CAPS, Take 2,000 mg by mouth daily with breakfast., Disp: , Rfl:    levothyroxine (SYNTHROID) 50 MCG tablet, Take 1 tablet (50 mcg total) by mouth daily before breakfast. Avoid meal at least 30 minutes after taking medication., Disp: 90 tablet, Rfl: 3   Multiple Vitamin (MULTIVITAMIN) tablet, Take 1 tablet by mouth daily., Disp: , Rfl:    potassium citrate (UROCIT-K) 10 MEQ (1080 MG) SR tablet, Take 2 tablets (20 mEq total) by mouth 2 (two) times daily with a meal., Disp: 360 tablet, Rfl: 1   Psyllium (METAMUCIL FIBER PO), Take 2 capsules by mouth daily with breakfast., Disp: , Rfl:    simvastatin (ZOCOR) 40 MG tablet, Take 1 tablet (40 mg total) by mouth daily at 6 PM., Disp: 90 tablet, Rfl: 3   vitamin C (ASCORBIC ACID) 500 MG tablet, Take 500 mg by mouth at bedtime., Disp: , Rfl:    Zinc 20 MG CAPS, Take 30 mg by mouth daily., Disp: , Rfl:   No Known Allergies  OBJECTIVE: There were no vitals taken for this visit. Physical Exam Vitals and nursing note reviewed.  Constitutional:      General: He is not in acute distress.    Appearance: Normal appearance. He is not ill-appearing, toxic-appearing or diaphoretic.  HENT:     Head: Normocephalic and atraumatic.  Eyes:     General: No scleral icterus.       Right eye: No discharge.        Left eye: No discharge.     Extraocular Movements: Extraocular movements intact.     Pupils: Pupils are equal, round, and reactive to light.  Cardiovascular:     Rate and Rhythm: Normal rate and regular rhythm.  Pulmonary:     Effort: Pulmonary effort is normal. No respiratory distress.     Breath sounds: Normal breath sounds. No wheezing, rhonchi or rales.  Musculoskeletal:     Right lower leg: No edema.     Left lower leg: No edema.  Skin:    General: Skin is warm.     Findings: No rash.  Neurological:     Mental Status: He is alert and oriented to  person, place, and time. Mental status is at baseline.  Psychiatric:        Mood and Affect: Mood normal.        Behavior: Behavior normal.        Thought Content: Thought content normal.        Judgment: Judgment normal.    ASSESSMENT AND PLAN: [name] [name] is a 87 y.o. male present for Chronic Conditions/illness Management Benign prostatic hyperplasia without lower urinary tract symptoms/nephrolithiasis/elevated PSA Stable Continue celebrex. - flomax tried and reported not helpful.  -Continue potassium citrate Established with urology  Hypertension/edema/dyslipidemia/Essential hypertension/Overweight (BMI 25.0-29.9) Cardiology initially discontinued his losartan secondary to low blood pressures and fatigue.  Most recent visit they started Lasix 20 mg daily for edema with mildly elevated  pressures. Reviewed recent echo 02/19/2023 with EF 60% and diastolic 1 dysfunction.  Mild mitral regurg, mild aortic regurg and mild stenosis. Continue Zocor 40 mg daily - continue baby aspirin - low sodium diet and routine exercise.  Marland Kitchen  Neuropathy- chronic s/p trauma Stable status post trauma to lower extremity. Continue gabapentin 600 mg twice daily  Primary osteoarthritis involving multiple joints Stable Continue Celebrex  Hypothyroidism: Continue levo 50 mcg.  -Reviewed labs collected by another provider 02/19/2023 with normal TSH.  Abnormal ABI: Discussed signs and symptoms for him to monitor for decreased circulation.  At this time he would like to wait on any further testing.   No orders of the defined types were placed in this encounter.   No orders of the defined types were placed in this encounter.   Referral Orders  No referral(s) requested today       Felix Pacini, DO 08/20/2023

## 2023-08-26 ENCOUNTER — Ambulatory Visit (HOSPITAL_BASED_OUTPATIENT_CLINIC_OR_DEPARTMENT_OTHER)
Admission: RE | Admit: 2023-08-26 | Discharge: 2023-08-26 | Disposition: A | Discharge status: Home / Self Care | Payer: PPO | Source: Ambulatory Visit | Attending: Urology | Admitting: Urology

## 2023-08-26 ENCOUNTER — Other Ambulatory Visit: Payer: Self-pay

## 2023-08-26 ENCOUNTER — Encounter: Payer: Self-pay | Admitting: Urology

## 2023-08-26 ENCOUNTER — Ambulatory Visit: Payer: PPO | Admitting: Urology

## 2023-08-26 VITALS — BP 148/75 | HR 73 | Ht 69.0 in | Wt 180.0 lb

## 2023-08-26 DIAGNOSIS — N401 Enlarged prostate with lower urinary tract symptoms: Secondary | ICD-10-CM

## 2023-08-26 DIAGNOSIS — N200 Calculus of kidney: Secondary | ICD-10-CM

## 2023-08-26 DIAGNOSIS — N138 Other obstructive and reflux uropathy: Secondary | ICD-10-CM | POA: Diagnosis not present

## 2023-08-26 DIAGNOSIS — R972 Elevated prostate specific antigen [PSA]: Secondary | ICD-10-CM | POA: Diagnosis not present

## 2023-08-26 DIAGNOSIS — Z87442 Personal history of urinary calculi: Secondary | ICD-10-CM | POA: Diagnosis not present

## 2023-08-26 NOTE — Progress Notes (Signed)
Assessment: 1. BPH with obstruction/lower urinary tract symptoms   2. Nephrolithiasis   3. Elevated PSA     Plan: Continue potassium citrate for stone prevention. KUB ordered today. He wishes to continue to monitor his BPH with lower urinary tract symptoms. Return to office in 1 year   Chief Complaint:  Chief Complaint  Patient presents with   Benign Prostatic Hypertrophy    History of Present Illness:  [name] [name] is a 87 y.o. male who is seen for further evaluation of BPH with lower urinary tract symptoms and nephrolithiasis.  BPH with LUTS: He has a history of BPH with obstruction and nephrolithiasis.  He is status post a TURP in 2013.  He had recurrence of his lower urinary tract symptoms with frequency, urgency, nocturia x 2, and incomplete emptying.  He had previously tried Flomax without improvement.  He has been using Celebrex at night with reported improvement in his urinary symptoms.  Evaluation with cystoscopy in 2019 showed enlargement of the prostate and small bladder calculi.  PVR from 6/21 was 245 mL.  He was given a trial of alfuzosin in June 2021 but discontinued the medication due to worsening of his symptoms.  He reported that his urinary symptoms were overall stable in January 2022.  He continued to have frequency and urgency primarily in the morning and nocturia x 2.  PVR at that time was 167 mL.  He was given a trial of Myrbetriq 25 mg daily.  He did not see any improvement in his symptoms with Myrbetriq and discontinued the medication after several weeks.  At the time of his visit in 2/22, he was not taking any medication for his lower urinary tract symptoms. He was seen by Dr. Sherryl Barters in 2/24.  He did not want to pursue any additional medical therapy at that time. At his visit in June 2024, he reported that his lower urinary tract symptoms were stable.  He continued to have frequency and urgency, primarily in the morning.  He also had nocturia x 2. IPSS =  23.  He reports that his lower urinary tract symptoms are fairly stable.  He was recently started on furosemide and has had increased frequency and urgency in the morning after taking the medication.  He does have occasional urge incontinence.  His nocturia is stable at 2 times per night.  No dysuria or gross hematuria.  He reports that Celebrex continues to control his nighttime symptoms. IPSS = 21 QOL = 2 today.  Nephrolithiasis: He has a history of nephrolithiasis and is status post lithotripsy in August 2008.  He has been on potassium citrate for a number of years.  CT imaging from 2019 showed a left renal calculus.  KUB from 6/21 showed calcifications in the left lower renal shadow measuring 15 x 9 and 7 x 10 mm respectively. KUB from 2/24 showed stable calcifications in the left lower renal shadow. CT chest from 12/20/2022 showed multiple renal calculi in the lower pole left kidney with associated mild caliectasis.  He continues on potassium citrate.  No recent stone symptoms.  No flank pain.  Elevated PSA: He has a history of elevated PSA with a prior negative biopsy. PSA results: 3.82 11/17 4.61 10/18 25% free 6.31 5/19 36% free 5.02 6/19 21% free 4.88 1/20 26% free 5.28 7/20 20% free 4.24 6/21 23% free   Given his age, continued monitoring of the PSA was not recommended.  Portions of the above documentation were copied from a  prior visit for review purposes only.  Past Medical History:  Past Medical History:  Diagnosis Date   Abnormal ankle brachial index (ABI) 07/05/2019   ABI of the toe, at home health visit was abnormal for severe PAD-reading obtained left large toe which has callus formation.   Abnormal echocardiogram 05/13/2019   Aortic valve calcification 05/13/2019   Arteriosclerosis of abdominal aorta (HCC) 01/17/2020   Arthralgia of left temporomandibular joint 01/07/2023   BPH (benign prostatic hyperplasia) 04/23/2007   Qualifier: Diagnosis of  By: Marcelyn Ditty RN,  Katy Fitch    BPH with obstruction/lower urinary tract symptoms 04/23/2007   Qualifier: Diagnosis of   By: Marcelyn Ditty RN, Katy Fitch        Carotid atherosclerosis 01/21/2020   Chronic cough 07/05/2019   Chronic swimmer's ear of both sides 06/14/2016   Degeneration of lumbar intervertebral disc 12/09/2019   Dyslipidemia 04/23/2007   Qualifier: Diagnosis of  By: Maryan Char    Elevated PSA 03/12/2023   Essential hypertension 06/02/2008   Qualifier: Diagnosis of  By: Amador Cunas  MD, Janett Labella    Hypervitaminosis D 07/02/2022   Hypothyroidism 05/13/2019   Kidney stones    LAE (left atrial enlargement) 05/13/2019   Mild aortic stenosis 08/07/2021   Moderate mitral regurgitation 08/07/2021   Nephrolithiasis 12/25/2022   Neuropathy    s/p trauma to left lower ext (horse accident)   Osteoarthritis 04/23/2007   Qualifier: Diagnosis of  By: Marcelyn Ditty RN, Katy Fitch    Otalgia of left ear 01/07/2023   Palpitations 02/04/2008   Qualifier: Diagnosis of  By: Amador Cunas  MD, Janett Labella    Pedal edema 01/21/2023   Sensorineural hearing loss (SNHL), bilateral 06/14/2016   TIA (transient ischemic attack) 05/13/2019   Tinnitus 02/28/2010   deaf left ear(no hearing)   Visual field defect 04/29/2019    Past Surgical History:  Past Surgical History:  Procedure Laterality Date   APPENDECTOMY     CATARACT EXTRACTION Bilateral 04/2020   COLONOSCOPY     HERNIA REPAIR     left foot surgery for fx  Jul 30, 2011   LITHOTRIPSY  1992   cystoscopy with stent placement also done(1 stone fragment remains)   right shoulder replacement  Sep 28, 2009   TOTAL KNEE ARTHROPLASTY Right 08/30/2013   Procedure: RIGHT TOTAL KNEE ARTHROPLASTY RIGHT ;  Surgeon: Loanne Drilling, MD;  Location: [facility];  Service: Orthopedics;  Laterality: Right;   TOTAL SHOULDER ARTHROPLASTY Left 02/02/2015   Procedure: LEFT TOTAL SHOULDER ARTHROPLASTY;  Surgeon: Francena Hanly, MD;  Location: [facility];  Service: Orthopedics;  Laterality: Left;    TRANSURETHRAL RESECTION OF PROSTATE  03/27/2012   Procedure: TRANSURETHRAL RESECTION OF THE PROSTATE WITH GYRUS INSTRUMENTS;  Surgeon: Garnett Farm, MD;  Location: [facility];  Service: Urology;;    Allergies:  No Known Allergies  Family History:  Family History  Problem Relation Age of Onset   Heart disease Mother 72       MI   Stroke Father 45   COPD Sister    Diabetes Sister    Diabetes Brother     Social History:  Social History   Tobacco Use   Smoking status: Never   Smokeless tobacco: Never  Vaping Use   Vaping status: Never Used  Substance Use Topics   Alcohol use: No   Drug use: No    ROS: Constitutional:  Negative for fever, chills, weight loss CV: Negative for chest pain, previous MI, hypertension Respiratory:  Negative for shortness of breath, wheezing, sleep apnea, frequent cough GI:  Negative for nausea, vomiting, bloody stool, GERD  Physical exam: BP (!) 148/75   Pulse 73   Ht 5\' 9"  (1.753 m)   Wt 180 lb (81.6 kg)   BMI 26.58 kg/m  GENERAL APPEARANCE:  Well appearing, well developed, well nourished, NAD HEENT:  Atraumatic, normocephalic, oropharynx clear NECK:  Supple without lymphadenopathy or thyromegaly ABDOMEN:  Soft, non-tender, no masses EXTREMITIES:  Moves all extremities well, without clubbing, cyanosis, or edema NEUROLOGIC:  Alert and oriented x 3, normal gait, CN II-XII grossly intact MENTAL STATUS:  appropriate BACK:  Non-tender to palpation, No CVAT SKIN:  Warm, dry, and intact  Results: U/A: pH 5.5, 0-5 WBCs, 0-2 RBCs

## 2023-08-27 ENCOUNTER — Encounter: Payer: Self-pay | Admitting: Family Medicine

## 2023-08-27 ENCOUNTER — Ambulatory Visit: Payer: PPO | Admitting: Family Medicine

## 2023-08-27 VITALS — BP 120/80 | HR 81 | Temp 97.7°F | Wt 182.4 lb

## 2023-08-27 DIAGNOSIS — G629 Polyneuropathy, unspecified: Secondary | ICD-10-CM | POA: Diagnosis not present

## 2023-08-27 DIAGNOSIS — I350 Nonrheumatic aortic (valve) stenosis: Secondary | ICD-10-CM

## 2023-08-27 DIAGNOSIS — E034 Atrophy of thyroid (acquired): Secondary | ICD-10-CM

## 2023-08-27 DIAGNOSIS — E785 Hyperlipidemia, unspecified: Secondary | ICD-10-CM

## 2023-08-27 DIAGNOSIS — M150 Primary generalized (osteo)arthritis: Secondary | ICD-10-CM | POA: Diagnosis not present

## 2023-08-27 DIAGNOSIS — I10 Essential (primary) hypertension: Secondary | ICD-10-CM | POA: Diagnosis not present

## 2023-08-27 DIAGNOSIS — I359 Nonrheumatic aortic valve disorder, unspecified: Secondary | ICD-10-CM

## 2023-08-27 LAB — MICROSCOPIC EXAMINATION

## 2023-08-27 LAB — URINALYSIS, ROUTINE W REFLEX MICROSCOPIC
Bilirubin, UA: NEGATIVE
Glucose, UA: NEGATIVE
Nitrite, UA: NEGATIVE
Protein,UA: NEGATIVE
Specific Gravity, UA: 1.015 (ref 1.005–1.030)
Urobilinogen, Ur: 0.2 mg/dL (ref 0.2–1.0)
pH, UA: 5.5 (ref 5.0–7.5)

## 2023-08-27 MED ORDER — GABAPENTIN 600 MG PO TABS: 600.0000 mg | ORAL_TABLET | Freq: Two times a day (BID) | ORAL | 1 refills | Status: DC

## 2023-08-27 MED ORDER — POTASSIUM CITRATE ER 10 MEQ (1080 MG) PO TBCR: 20.0000 meq | EXTENDED_RELEASE_TABLET | Freq: Two times a day (BID) | ORAL | 1 refills | Status: DC

## 2023-08-27 MED ORDER — CELECOXIB 200 MG PO CAPS: 200.0000 mg | ORAL_CAPSULE | Freq: Every day | ORAL | 1 refills | Status: DC

## 2023-08-27 MED ORDER — FLUTICASONE PROPIONATE 50 MCG/ACT NA SUSP: 2.0000 | Freq: Every day | NASAL | 6 refills | Status: DC

## 2023-08-27 NOTE — Patient Instructions (Addendum)
 Return in about 6 months (around 03/01/2024) for cpe (20 min), Routine chronic condition follow-up.        Great to see you today.  I have refilled the medication(s) we provide.   If labs were collected or images ordered, we will inform you of  results once we have received them and reviewed. We will contact you either by echart message, or telephone call.  Please give ample time to the testing facility, and our office to run,  receive and review results. Please do not call inquiring of results, even if you can see them in your chart. We will contact you as soon as we are able. If it has been over 1 week since the test was completed, and you have not yet heard from [place], then please call us.    - echart message- for normal results that have been seen by the patient already.   - telephone call: abnormal results or if patient has not viewed results in their echart.  If a referral to a specialist was entered for you, please call us in 2 weeks if you have not heard from the specialist office to schedule.

## 2023-08-27 NOTE — Progress Notes (Signed)
Patient Care Team    Relationship Specialty Notifications Start End  Natalia Leatherwood, DO PCP - General Family Medicine  01/19/19   Aris Lot, MD Consulting Physician Dermatology  01/19/20   Revankar, Aundra Dubin, MD Consulting Physician Cardiology  01/19/20   Gaylord Shih, [facility]  Specialist  01/19/20   Marlene Bast  Optometry  01/19/20    Chief Complaint  Patient presents with   Hypertension    [facility]   SUBJECTIVE HPI: [name] [name] is a 87 y.o. male present for chronic medical condition management. All past medical history, surgical history, allergies, family history, immunizations and social history was obtained from the patient today and entered into the electronic medical record.   Hypertension/hyperlipidemia: Pt reports compliance with Zocor 40 mg nightly and lasix 20 qd.   Patient denies chest pain, shortness of breath, dizziness or lower extremity edema.  Pt takes a daily baby ASA. Pt is  prescribed statin. Diet: Monitors his diet Exercise: Routine exercise RF: Hypertension, hyperlipidemia, family history of heart disease and stroke  BPH/arthritis/neuropathy: Patient has a history of arthritis status post shoulder replacement and knee arthroplasty.  He also has a history of BPH/s/p TURP.  He states he takes Celebrex 200 mg daily and this medication has been extremely helpful for his BPH and his arthritis.Marland Kitchen  He did try the Flomax and it was not helpful.   He sustained a lower extremity fracture injury in which he has chronic neuropathy.  Celebrex 200 mg daily and gabapentin 600 mg twice daily is very helpful for pain and keeps him active. ROS: See pertinent positives and negatives per HPI.    Patient Active Problem List   Diagnosis Date Noted   Elevated PSA 03/12/2023   Pedal edema 01/21/2023   Arthralgia of left temporomandibular joint 01/07/2023   Otalgia of left ear 01/07/2023   Nephrolithiasis 12/25/2022   Hypervitaminosis D 07/02/2022   Mild aortic stenosis  08/07/2021   Moderate mitral regurgitation 08/07/2021   Carotid atherosclerosis 01/21/2020   Arteriosclerosis of abdominal aorta (HCC) 01/17/2020   Degeneration of lumbar intervertebral disc 12/09/2019   Abnormal ankle brachial index (ABI) 07/05/2019   Chronic cough 07/05/2019   LAE (left atrial enlargement) 05/13/2019   Aortic valve calcification 05/13/2019   TIA (transient ischemic attack) 05/13/2019   Hypothyroidism 05/13/2019   Abnormal echocardiogram 05/13/2019   Visual field defect 04/29/2019   Neuropathy- chronic s/p trauma 01/20/2019   Sensorineural hearing loss (SNHL), bilateral 06/14/2016   Chronic swimmer's ear of both sides 06/14/2016   Tinnitus 02/28/2010   Essential hypertension 06/02/2008   Palpitations 02/04/2008   Dyslipidemia 04/23/2007   BPH with obstruction/lower urinary tract symptoms 04/23/2007    Class: Present on Admission   Osteoarthritis 04/23/2007   BPH (benign prostatic hyperplasia) 04/23/2007    Social History   Tobacco Use   Smoking status: Never   Smokeless tobacco: Never  Substance Use Topics   Alcohol use: No    Current Outpatient Medications:    aspirin 81 MG chewable tablet, Chew 81 mg by mouth daily., Disp: , Rfl:    celecoxib (CELEBREX) 200 MG capsule, Take 1 capsule (200 mg total) by mouth daily., Disp: 90 capsule, Rfl: 1   cholecalciferol (VITAMIN D3) 25 MCG (1000 UNIT) tablet, Take 1,000 Units by mouth daily., Disp: , Rfl:    Coenzyme Q10 (COQ10) 200 MG CAPS, Take 200 mg by mouth at bedtime., Disp: , Rfl:    fexofenadine (ALLEGRA) 180 MG tablet, Take 180 mg  by mouth daily., Disp: , Rfl:    fluocinolone (SYNALAR) 0.01 % external solution, Apply topically 2 (two) times daily., Disp: 60 mL, Rfl: 1   fluticasone (FLONASE) 50 MCG/ACT nasal spray, Place 2 sprays into both nostrils daily., Disp: 16 g, Rfl: 6   furosemide (LASIX) 20 MG tablet, Take 20 mg by mouth daily., Disp: , Rfl:    gabapentin (NEURONTIN) 600 MG tablet, Take 1 tablet (600  mg total) by mouth 2 (two) times daily., Disp: 180 tablet, Rfl: 1   levothyroxine (SYNTHROID) 50 MCG tablet, Take 1 tablet (50 mcg total) by mouth daily before breakfast. Avoid meal at least 30 minutes after taking medication., Disp: 90 tablet, Rfl: 3   Multiple Vitamin (MULTIVITAMIN) tablet, Take 1 tablet by mouth daily., Disp: , Rfl:    potassium citrate (UROCIT-K) 10 MEQ (1080 MG) SR tablet, Take 2 tablets (20 mEq total) by mouth 2 (two) times daily with a meal., Disp: 360 tablet, Rfl: 1   Psyllium (METAMUCIL FIBER PO), Take 2 capsules by mouth daily with breakfast., Disp: , Rfl:    simvastatin (ZOCOR) 40 MG tablet, Take 1 tablet (40 mg total) by mouth daily at 6 PM., Disp: 90 tablet, Rfl: 3   vitamin C (ASCORBIC ACID) 500 MG tablet, Take 500 mg by mouth at bedtime., Disp: , Rfl:    Zinc 20 MG CAPS, Take 30 mg by mouth daily., Disp: , Rfl:   No Known Allergies  OBJECTIVE: BP 120/80   Pulse 81   Temp 97.7 F (36.5 C)   Wt 182 lb 6.4 oz (82.7 kg)   SpO2 97%   BMI 26.94 kg/m  Physical Exam Vitals and nursing note reviewed.  Constitutional:      General: He is not in acute distress.    Appearance: Normal appearance. He is not ill-appearing, toxic-appearing or diaphoretic.  HENT:     Head: Normocephalic and atraumatic.  Eyes:     General: No scleral icterus.       Right eye: No discharge.        Left eye: No discharge.     Extraocular Movements: Extraocular movements intact.     Pupils: Pupils are equal, round, and reactive to light.  Cardiovascular:     Rate and Rhythm: Normal rate and regular rhythm.     Heart sounds: Murmur heard.  Pulmonary:     Effort: Pulmonary effort is normal. No respiratory distress.     Breath sounds: Normal breath sounds. No wheezing, rhonchi or rales.  Musculoskeletal:     Right lower leg: No edema.     Left lower leg: No edema.  Skin:    General: Skin is warm.     Findings: No rash.  Neurological:     Mental Status: He is alert and oriented to  person, place, and time. Mental status is at baseline.  Psychiatric:        Mood and Affect: Mood normal.        Behavior: Behavior normal.        Thought Content: Thought content normal.        Judgment: Judgment normal.    ASSESSMENT AND PLAN: [name] [name] is a 87 y.o. male present for cpe and Chronic Conditions/illness Management Benign prostatic hyperplasia without lower urinary tract symptoms/nephrolithiasis/elevated PSA Stable Continue  celebrex. - flomax tried and reported not helpful.  - continue potassium citrate Est/w urology  Hypertension/edema/dyslipidemia/Essential hypertension/Overweight (BMI 25.0-29.9) Most recent visit cardiology  started >Lasix 20 mg daily  Continue Zocor 40 mg daily> [facility] - continue baby aspirin Reviewed recent echo 02/19/2023 with EF 60% and diastolic 1 dysfunction.  Mild mitral regurg, mild aortic regurg and mild stenosis. - low sodium diet and routine exercise.  Reviewed recent lipid panel collected by another provider 02/19/2023, cholesterol at goal.  Neuropathy- chronic s/p trauma Stable-status post trauma to lower extremity. Continue  gabapentin 600 mg twice daily  Primary osteoarthritis involving multiple joints Stable Continue Celebrex  Hypothyroidism: Stable Continue levo 50 mcg.  -Reviewed labs collected by another provider 02/19/2023 with normal TSH.  Abnormal ABI: Discussed signs and symptoms for him to monitor for decreased circulation.  At this time he would like to wait on any further testing.  Return in about 6 months (around 03/01/2024) for cpe (20 min), Routine chronic condition follow-up.  No orders of the defined types were placed in this encounter.   Meds ordered this encounter  Medications   celecoxib (CELEBREX) 200 MG capsule    Sig: Take 1 capsule (200 mg total) by mouth daily.    Dispense:  90 capsule    Refill:  1    Hold until  pt request   fluticasone (FLONASE) 50 MCG/ACT nasal spray    Sig: Place 2  sprays into both nostrils daily.    Dispense:  16 g    Refill:  6    Hold until  pt request   gabapentin (NEURONTIN) 600 MG tablet    Sig: Take 1 tablet (600 mg total) by mouth 2 (two) times daily.    Dispense:  180 tablet    Refill:  1    Hold until  pt request   potassium citrate (UROCIT-K) 10 MEQ (1080 MG) SR tablet    Sig: Take 2 tablets (20 mEq total) by mouth 2 (two) times daily with a meal.    Dispense:  360 tablet    Refill:  1    Hold until  pt request    Referral Orders  No referral(s) requested today       Felix Pacini, DO 08/27/2023

## 2023-09-04 ENCOUNTER — Ambulatory Visit: Payer: PPO | Admitting: Urology

## 2023-09-24 ENCOUNTER — Ambulatory Visit: Payer: PPO | Admitting: *Deleted

## 2023-09-24 DIAGNOSIS — Z0000 Encounter for general adult medical examination without abnormal findings: Secondary | ICD-10-CM

## 2023-09-24 NOTE — Patient Instructions (Signed)
 [name] [name] , Thank you for taking time to come for your Medicare Wellness Visit. I appreciate your ongoing commitment to your health goals. Please review the following plan we discussed and let me know if I can assist you in the future.   Screening recommendations/referrals: Colonoscopy: no longer required Recommended yearly ophthalmology/optometry visit for glaucoma screening and checkup Recommended yearly dental visit for hygiene and checkup  Vaccinations: Influenza vaccine: up to date Pneumococcal vaccine: up to date Tdap vaccine: up to date Shingles vaccine: up to date       Preventive Care 65 Years and Older, Male Preventive care refers to lifestyle choices and visits with your health care provider that can promote health and wellness. What does preventive care include? A yearly physical exam. This is also called an annual well check. Dental exams once or twice a year. Routine eye exams. Ask your health care provider how often you should have your eyes checked. Personal lifestyle choices, including: Daily care of your teeth and gums. Regular physical activity. Eating a healthy diet. Avoiding tobacco and drug use. Limiting alcohol use. Practicing safe sex. Taking low doses of aspirin  every day. Taking vitamin and mineral supplements as recommended by your health care provider. What happens during an annual well check? The services and screenings done by your health care provider during your annual well check will depend on your age, overall health, lifestyle risk factors, and family history of disease. Counseling  Your health care provider may ask you questions about your: Alcohol use. Tobacco use. Drug use. Emotional well-being. Home and relationship well-being. Sexual activity. Eating habits. History of falls. Memory and ability to understand (cognition). Work and work astronomer. Screening  You may have the following tests or measurements: Height, weight, and  BMI. Blood pressure. Lipid and cholesterol levels. These may be checked every 5 years, or more frequently if you are over 78 years old. Skin check. Lung cancer screening. You may have this screening every year starting at age 72 if you have a 30-pack-year history of smoking and currently smoke or have quit within the past 15 years. Fecal occult blood test (FOBT) of the stool. You may have this test every year starting at age 47. Flexible sigmoidoscopy or colonoscopy. You may have a sigmoidoscopy every 5 years or a colonoscopy every 10 years starting at age 27. Prostate cancer screening. Recommendations will vary depending on your family history and other risks. Hepatitis C blood test. Hepatitis B blood test. Sexually transmitted disease (STD) testing. Diabetes screening. This is done by checking your blood sugar (glucose) after you have not eaten for a while (fasting). You may have this done every 1-3 years. Abdominal aortic aneurysm (AAA) screening. You may need this if you are a current or former smoker. Osteoporosis. You may be screened starting at age 78 if you are at high risk. Talk with your health care provider about your test results, treatment options, and if necessary, the need for more tests. Vaccines  Your health care provider may recommend certain vaccines, such as: Influenza vaccine. This is recommended every year. Tetanus, diphtheria, and acellular pertussis (Tdap, Td) vaccine. You may need a Td booster every 10 years. Zoster vaccine. You may need this after age 34. Pneumococcal 13-valent conjugate (PCV13) vaccine. One dose is recommended after age 24. Pneumococcal polysaccharide (PPSV23) vaccine. One dose is recommended after age 23. Talk to your health care provider about which screenings and vaccines you need and how often you need them. This information is not  intended to replace advice given to you by your health care provider. Make sure you discuss any questions you have  with your health care provider. Document Released: 09/29/2015 Document Revised: 05/22/2016 Document Reviewed: 07/04/2015 Elsevier Interactive Patient Education  2017 [employer].  Fall Prevention in the Home Falls can cause injuries. They can happen to people of all ages. There are many things you can do to make your home safe and to help prevent falls. What can I do on the outside of my home? Regularly fix the edges of walkways and driveways and fix any cracks. Remove anything that might make you trip as you walk through a door, such as a raised step or threshold. Trim any bushes or trees on the path to your home. Use bright outdoor lighting. Clear any walking paths of anything that might make someone trip, such as rocks or tools. Regularly check to see if handrails are loose or broken. Make sure that both sides of any steps have handrails. Any raised decks and porches should have guardrails on the edges. Have any leaves, snow, or ice cleared regularly. Use sand or salt on walking paths during winter. Clean up any spills in your garage right away. This includes oil or grease spills. What can I do in the bathroom? Use night lights. Install grab bars by the toilet and in the tub and shower. Do not use towel bars as grab bars. Use non-skid mats or decals in the tub or shower. If you need to sit down in the shower, use a plastic, non-slip stool. Keep the floor dry. Clean up any water  that spills on the floor as soon as it happens. Remove soap buildup in the tub or shower regularly. Attach bath mats securely with double-sided non-slip rug tape. Do not have throw rugs and other things on the floor that can make you trip. What can I do in the bedroom? Use night lights. Make sure that you have a light by your bed that is easy to reach. Do not use any sheets or blankets that are too big for your bed. They should not hang down onto the floor. Have a firm chair that has side arms. You can use  this for support while you get dressed. Do not have throw rugs and other things on the floor that can make you trip. What can I do in the kitchen? Clean up any spills right away. Avoid walking on wet floors. Keep items that you use a lot in easy-to-reach places. If you need to reach something above you, use a strong step stool that has a grab bar. Keep electrical cords out of the way. Do not use floor polish or wax that makes floors slippery. If you must use wax, use non-skid floor wax. Do not have throw rugs and other things on the floor that can make you trip. What can I do with my stairs? Do not leave any items on the stairs. Make sure that there are handrails on both sides of the stairs and use them. Fix handrails that are broken or loose. Make sure that handrails are as long as the stairways. Check any carpeting to make sure that it is firmly attached to the stairs. Fix any carpet that is loose or worn. Avoid having throw rugs at the top or bottom of the stairs. If you do have throw rugs, attach them to the floor with carpet tape. Make sure that you have a light switch at the top of the stairs  and the bottom of the stairs. If you do not have them, ask someone to add them for you. What else can I do to help prevent falls? Wear shoes that: Do not have high heels. Have rubber bottoms. Are comfortable and fit you well. Are closed at the toe. Do not wear sandals. If you use a stepladder: Make sure that it is fully opened. Do not climb a closed stepladder. Make sure that both sides of the stepladder are locked into place. Ask someone to hold it for you, if possible. Clearly mark and make sure that you can see: Any grab bars or handrails. First and last steps. Where the edge of each step is. Use tools that help you move around (mobility aids) if they are needed. These include: Canes. Walkers. Scooters. Crutches. Turn on the lights when you go into a dark area. Replace any light bulbs  as soon as they burn out. Set up your furniture so you have a clear path. Avoid moving your furniture around. If any of your floors are uneven, fix them. If there are any pets around you, be aware of where they are. Review your medicines with your doctor. Some medicines can make you feel dizzy. This can increase your chance of falling. Ask your doctor what other things that you can do to help prevent falls. This information is not intended to replace advice given to you by your health care provider. Make sure you discuss any questions you have with your health care provider. Document Released: 06/29/2009 Document Revised: 02/08/2016 Document Reviewed: 10/07/2014 Elsevier Interactive Patient Education  2017 [employer].

## 2023-09-24 NOTE — Progress Notes (Signed)
 Subjective:   [name] [name] is a 88 y.o. male who presents for Medicare Annual/Subsequent preventive examination.  Visit Complete: Virtual I connected with  [name] [name] on 09/24/23 by a audio enabled telemedicine application and verified that I am speaking with the correct person using two identifiers.  Patient Location: Home  Provider Location: Home Office  I discussed the limitations of evaluation and management by telemedicine. The patient expressed understanding and agreed to proceed.  Vital Signs: Because this visit was a virtual/telehealth visit, some criteria may be missing or patient reported. Any vitals not documented were not able to be obtained and vitals that have been documented are patient reported.  Unable to video  Cardiac Risk Factors include: advanced age (>45men, >60 women);male gender;family history of premature cardiovascular disease;hypertension     Objective:    There were no vitals filed for this visit. There is no height or weight on file to calculate BMI.     09/24/2023    2:55 PM 12/20/2022    2:15 PM 08/29/2021    2:19 PM 08/23/2020    2:39 PM 10/12/2019   12:15 PM 04/29/2019    5:19 PM 03/12/2017    2:24 AM  Advanced Directives  Does Patient Have a Medical Advance Directive? Yes No Yes Yes Yes No No  Type of Estate Agent of [employer] Power of [place];Living will Healthcare Power of [place];Living will Healthcare Power of [place];Living will    Copy of Healthcare Power of Attorney in Chart? Yes - validated most recent copy scanned in chart (See row information)  No - copy requested Yes - validated most recent copy scanned in chart (See row information)     Would patient like information on creating a medical advance directive?  No - Patient declined         Current Medications (verified) Outpatient Encounter Medications as of 09/24/2023  Medication Sig   aspirin  81 MG chewable tablet Chew 81 mg by mouth  daily.   celecoxib  (CELEBREX ) 200 MG capsule Take 1 capsule (200 mg total) by mouth daily.   cholecalciferol  (VITAMIN D3) 25 MCG (1000 UNIT) tablet Take 1,000 Units by mouth daily.   Coenzyme Q10 (COQ10) 200 MG CAPS Take 200 mg by mouth at bedtime.   fexofenadine (ALLEGRA) 180 MG tablet Take 180 mg by mouth daily.   fluocinolone  (SYNALAR ) 0.01 % external solution Apply topically 2 (two) times daily.   fluticasone  (FLONASE ) 50 MCG/ACT nasal spray Place 2 sprays into both nostrils daily.   furosemide  (LASIX ) 20 MG tablet Take 20 mg by mouth daily.   gabapentin  (NEURONTIN ) 600 MG tablet Take 1 tablet (600 mg total) by mouth 2 (two) times daily.   levothyroxine  (SYNTHROID ) 50 MCG tablet Take 1 tablet (50 mcg total) by mouth daily before breakfast. Avoid meal at least 30 minutes after taking medication.   Multiple Vitamin (MULTIVITAMIN) tablet Take 1 tablet by mouth daily.   potassium citrate  (UROCIT-K ) 10 MEQ (1080 MG) SR tablet Take 2 tablets (20 mEq total) by mouth 2 (two) times daily with a meal.   Psyllium (METAMUCIL FIBER PO) Take 2 capsules by mouth daily with breakfast.   simvastatin  (ZOCOR ) 40 MG tablet Take 1 tablet (40 mg total) by mouth daily at 6 PM.   vitamin C  (ASCORBIC ACID ) 500 MG tablet Take 500 mg by mouth at bedtime.   Zinc 20 MG CAPS Take 30 mg by mouth daily.   No facility-administered encounter medications on file as  of 09/24/2023.    Allergies (verified) Patient has no known allergies.   History: Past Medical History:  Diagnosis Date   Abnormal ankle brachial index (ABI) 07/05/2019   ABI of the toe, at home health visit was abnormal for severe PAD-reading obtained left large toe which has callus formation.   Abnormal echocardiogram 05/13/2019   Aortic valve calcification 05/13/2019   Arteriosclerosis of abdominal aorta (HCC) 01/17/2020   Arthralgia of left temporomandibular joint 01/07/2023   BPH (benign prostatic hyperplasia) 04/23/2007   Qualifier: Diagnosis of   By: Nunzio RN, Dagoberto Caldron    BPH with obstruction/lower urinary tract symptoms 04/23/2007   Qualifier: Diagnosis of   By: Nunzio RN, Dagoberto Caldron        Carotid atherosclerosis 01/21/2020   Chronic cough 07/05/2019   Chronic swimmer's ear of both sides 06/14/2016   Degeneration of lumbar intervertebral disc 12/09/2019   Dyslipidemia 04/23/2007   Qualifier: Diagnosis of  By: Nunzio OBIE Dagoberto Caldron    Elevated PSA 03/12/2023   Essential hypertension 06/02/2008   Qualifier: Diagnosis of  By: Jame  MD, Maude FALCON    Hypervitaminosis D 07/02/2022   Hypothyroidism 05/13/2019   Kidney stones    LAE (left atrial enlargement) 05/13/2019   Mild aortic stenosis 08/07/2021   Moderate mitral regurgitation 08/07/2021   Nephrolithiasis 12/25/2022   Neuropathy    s/p trauma to left lower ext (horse accident)   Osteoarthritis 04/23/2007   Qualifier: Diagnosis of  By: Nunzio RN, Dagoberto Caldron    Otalgia of left ear 01/07/2023   Palpitations 02/04/2008   Qualifier: Diagnosis of  By: Jame  MD, Maude FALCON    Pedal edema 01/21/2023   Sensorineural hearing loss (SNHL), bilateral 06/14/2016   TIA (transient ischemic attack) 05/13/2019   Tinnitus 02/28/2010   deaf left ear(no hearing)   Visual field defect 04/29/2019   Past Surgical History:  Procedure Laterality Date   APPENDECTOMY     CATARACT EXTRACTION Bilateral 04/2020   COLONOSCOPY     HERNIA REPAIR     left foot surgery for fx  Jul 30, 2011   LITHOTRIPSY  1992   cystoscopy with stent placement also done(1 stone fragment remains)   right shoulder replacement  Sep 28, 2009   TOTAL KNEE ARTHROPLASTY Right 08/30/2013   Procedure: RIGHT TOTAL KNEE ARTHROPLASTY RIGHT ;  Surgeon: Dempsey LULLA Moan, MD;  Location: [facility];  Service: Orthopedics;  Laterality: Right;   TOTAL SHOULDER ARTHROPLASTY Left 02/02/2015   Procedure: LEFT TOTAL SHOULDER ARTHROPLASTY;  Surgeon: Franky Pointer, MD;  Location: [facility];  Service: Orthopedics;  Laterality: Left;    TRANSURETHRAL RESECTION OF PROSTATE  03/27/2012   Procedure: TRANSURETHRAL RESECTION OF THE PROSTATE WITH GYRUS INSTRUMENTS;  Surgeon: Mark C Ottelin, MD;  Location: [facility];  Service: Urology;;   Family History  Problem Relation Age of Onset   Heart disease Mother 80       MI   Stroke Father 60   COPD Sister    Diabetes Sister    Diabetes Brother    Social History   Socioeconomic History   Marital status: Married    Spouse name: Not on file   Number of children: Not on file   Years of education: Not on file   Highest education level: 12th grade  Occupational History   Not on file  Tobacco Use   Smoking status: Never   Smokeless tobacco: Never  Vaping Use   Vaping status: Never Used  Substance and Sexual  Activity   Alcohol use: No   Drug use: No   Sexual activity: Yes    Partners: Female  Other Topics Concern   Not on file  Social History Narrative   Marital status/children/pets: Married   Education/employment: retired   Teacher, Early Years/pre Strain: Low Risk  (09/24/2023)   Overall Financial Resource Strain (CARDIA)    Difficulty of Paying Living Expenses: Not hard at all  Food Insecurity: No Food Insecurity (09/24/2023)   Hunger Vital Sign    Worried About Running Out of Food in the Last Year: Never true    Ran Out of Food in the Last Year: Never true  Transportation Needs: No Transportation Needs (09/24/2023)   PRAPARE - Administrator, Civil Service (Medical): No    Lack of Transportation (Non-Medical): No  Physical Activity: Sufficiently Active (09/24/2023)   Exercise Vital Sign    Days of Exercise per Week: 5 days    Minutes of Exercise per Session: 40 min  Stress: No Stress Concern Present (09/24/2023)   [employer] of Occupational Health - Occupational Stress Questionnaire    Feeling of Stress : Only a little  Social Connections: Socially Integrated (09/24/2023)   Social Connection and Isolation Panel [NHANES]    Frequency  of Communication with Friends and Family: More than three times a week    Frequency of Social Gatherings with Friends and Family: More than three times a week    Attends Religious Services: More than 4 times per year    Active Member of [employer] or Organizations: Yes    Attends Engineer, Structural: More than 4 times per year    Marital Status: Married    Tobacco Counseling Counseling given: Not Answered   Clinical Intake:  Pre-visit preparation completed: Yes  Pain : No/denies pain     Diabetes: No  How often do you need to have someone help you when you read instructions, pamphlets, or other written materials from your doctor or pharmacy?: 1 - Never  Interpreter Needed?: No  Information entered by :: Mliss Graff LPN   Activities of Daily Living    09/24/2023    2:59 PM  In your present state of health, do you have any difficulty performing the following activities:  Hearing? 1  Vision? 0  Difficulty concentrating or making decisions? 0  Walking or climbing stairs? 0  Dressing or bathing? 0  Doing errands, shopping? 0  Preparing Food and eating ? N  Using the Toilet? N  In the past six months, have you accidently leaked urine? N  Do you have problems with loss of bowel control? N  Managing your Medications? N  Managing your Finances? N  Housekeeping or managing your Housekeeping? N    Patient Care Team: Catherine Charlies LABOR, DO as PCP - General (Family Medicine) Lynnell Nottingham, MD as Consulting Physician (Dermatology) Revankar, Jennifer SAUNDERS, MD as Consulting Physician (Cardiology) Ortho, [facility] (Specialist) Elma Zachary RAMAN [facility])  Indicate any recent Medical Services you may have received from other than [facility] providers in the past year (date may be approximate).     Assessment:   This is a routine wellness examination for [name] [name].  Hearing/Vision screen Hearing Screening - Comments:: Bilateral hearing aids Vision Screening - Comments:: [facility] Up to date   Goals Addressed             This Visit's Progress    Patient Stated  Continue current lifestyle       Depression Screen    09/24/2023    2:55 PM 02/28/2023    1:21 PM 09/04/2022    2:23 PM 08/29/2021    2:18 PM 03/20/2021    9:37 AM 08/23/2020    2:41 PM 01/19/2019    2:24 PM  PHQ 2/9 Scores  PHQ - 2 Score 2 2 0 1 0 0 0  PHQ- 9 Score 2 11         Fall Risk    09/24/2023    2:54 PM 02/28/2023    1:21 PM 10/13/2022    7:34 PM 09/04/2022    2:23 PM 08/29/2021    2:20 PM  Fall Risk   Falls in the past year? 0 0 0 0 0  Number falls in past yr: 0 0  0 0  Injury with Fall? 0 0  0 0  Risk for fall due to :  History of fall(s);Impaired balance/gait;Impaired vision;Medication side effect;Orthopedic patient   Impaired vision  Risk for fall due to: Comment     stumble at times  Follow up Falls evaluation completed;Education provided;Falls prevention discussed Falls evaluation completed  Falls evaluation completed Falls prevention discussed    MEDICARE RISK AT HOME: Medicare Risk at Home Any stairs in or around the home?: Yes If so, are there any without handrails?: No Home free of loose throw rugs in walkways, pet beds, electrical cords, etc?: Yes Life alert?: No Use of a cane, walker or w/c?: No Grab bars in the bathroom?: Yes Shower chair or bench in shower?: Yes Elevated toilet seat or a handicapped toilet?: Yes  TIMED UP AND GO:  Was the test performed?  No    Cognitive Function:        09/24/2023    2:58 PM 08/29/2021    2:22 PM  6CIT Screen  What Year? 0 points 0 points  What month? 0 points 0 points  What time? 0 points 0 points  Count back from 20 2 points 0 points  Months in reverse 2 points 0 points  Repeat phrase 0 points 2 points  Total Score 4 points 2 points    Immunizations Immunization History  Administered Date(s) Administered   Fluad Quad(high Dose 65+) 05/27/2019, 07/01/2022, 07/24/2022   Fluad  Trivalent(High Dose 65+) 05/02/2023   Influenza Split 06/12/2011   Influenza Whole 05/31/2010   Influenza, High Dose Seasonal PF 09/07/2014, 07/10/2016, 05/14/2017, 06/28/2018, 06/11/2021   Influenza-Unspecified 09/06/2014, 05/19/2017, 07/03/2020   PFIZER Comirnaty(Gray Top)Covid-19 Tri-Sucrose Vaccine 03/02/2021   PFIZER(Purple Top)SARS-COV-2 Vaccination 11/08/2019, 11/29/2019, 06/19/2020   Pfizer Covid-19 Vaccine Bivalent Booster 75yrs & up 08/15/2021   Pfizer(Comirnaty)Fall Seasonal Vaccine 12 years and older 08/20/2022   Pneumococcal Conjugate-13 09/07/2014   Pneumococcal Polysaccharide-23 05/14/2017   Td 05/16/2009   Tdap 02/11/2016   Zoster Recombinant(Shingrix) 05/04/2021, 07/11/2021   Zoster, Live 06/12/2011    TDAP status: Up to date  Flu Vaccine status: Up to date  Pneumococcal vaccine status: Up to date  Covid-19 vaccine status: Information provided on how to obtain vaccines.   Qualifies for Shingles Vaccine? No   Zostavax completed Yes   Shingrix Completed?: Yes  Screening Tests Health Maintenance  Topic Date Due   Medicare Annual Wellness (AWV)  09/23/2024   DTaP/Tdap/Td (3 - Td or Tdap) 02/10/2026   Pneumonia Vaccine 29+ Years old  Completed   INFLUENZA VACCINE  Completed   Zoster Vaccines- Shingrix  Completed   HPV VACCINES  Aged Out  COVID-19 Vaccine  Discontinued    Health Maintenance  There are no preventive care reminders to display for this patient.   Colorectal cancer screening: No longer required.   Lung Cancer Screening: (Low Dose CT Chest recommended if Age 67-80 years, 20 pack-year currently smoking OR have quit w/in 15years.) does not qualify.   Lung Cancer Screening Referral:   Additional Screening:  Hepatitis C Screening: does not qualify;  Vision Screening: Recommended annual ophthalmology exams for early detection of glaucoma and other disorders of the eye. Is the patient up to date with their annual eye exam?  Yes  Who is the  provider or what is the name of the office in which the patient attends annual eye exams? [facility] If pt is not established with a provider, would they like to be referred to a provider to establish care? No .   Dental Screening: Recommended annual dental exams for proper oral hygiene    Community Resource Referral / Chronic Care Management: CRR required this visit?  No   CCM required this visit?  No     Plan:     I have personally reviewed and noted the following in the patient's chart:   Medical and social history Use of alcohol, tobacco or illicit drugs  Current medications and supplements including opioid prescriptions. Patient is not currently taking opioid prescriptions. Functional ability and status Nutritional status Physical activity Advanced directives List of other physicians Hospitalizations, surgeries, and ER visits in previous 12 months Vitals Screenings to include cognitive, depression, and falls Referrals and appointments  In addition, I have reviewed and discussed with patient certain preventive protocols, quality metrics, and best practice recommendations. A written personalized care plan for preventive services as well as general preventive health recommendations were provided to patient.     Mliss Graff, LPN   04/16/7973   After Visit Summary: (MyChart) Due to this being a telephonic visit, the after visit summary with patients personalized plan was offered to patient via MyChart   Nurse Notes:

## 2023-10-29 DIAGNOSIS — M1712 Unilateral primary osteoarthritis, left knee: Secondary | ICD-10-CM | POA: Diagnosis not present

## 2024-01-13 ENCOUNTER — Encounter: Payer: Self-pay | Admitting: Family Medicine

## 2024-01-13 ENCOUNTER — Ambulatory Visit (INDEPENDENT_AMBULATORY_CARE_PROVIDER_SITE_OTHER): Admitting: Family Medicine

## 2024-01-13 VITALS — BP 130/80 | HR 74 | Wt 185.2 lb

## 2024-01-13 DIAGNOSIS — G629 Polyneuropathy, unspecified: Secondary | ICD-10-CM | POA: Diagnosis not present

## 2024-01-13 DIAGNOSIS — R400 Somnolence: Secondary | ICD-10-CM | POA: Diagnosis not present

## 2024-01-13 DIAGNOSIS — G8929 Other chronic pain: Secondary | ICD-10-CM | POA: Diagnosis not present

## 2024-01-13 DIAGNOSIS — L84 Corns and callosities: Secondary | ICD-10-CM | POA: Diagnosis not present

## 2024-01-13 DIAGNOSIS — R0683 Snoring: Secondary | ICD-10-CM | POA: Diagnosis not present

## 2024-01-13 DIAGNOSIS — M79674 Pain in right toe(s): Secondary | ICD-10-CM

## 2024-01-13 MED ORDER — DULOXETINE HCL 20 MG PO CPEP
20.0000 mg | ORAL_CAPSULE | Freq: Every day | ORAL | 1 refills | Status: AC
Start: 2024-01-13 — End: ?

## 2024-01-13 NOTE — Progress Notes (Signed)
 [name] [name] , 03/13/1936, 88 y.o., male MRN: [id number] Patient Care Team    Relationship Specialty Notifications Start End  Mariel Shope, DO PCP - General Family Medicine  01/19/19   Gaynelle Keeling, MD Consulting Physician Dermatology  01/19/20   Revankar, Micael Adas, MD Consulting Physician Cardiology  01/19/20   Amos Balint, Emerge  Specialist  01/19/20   Ric Chain  Optometry  01/19/20     Chief Complaint  Patient presents with   Toe Pain    Pt is having toe pain on his right foot for a few weeks now.     Subjective: [name] [name] is a 88 y.o. Pt presents for an OV with complaints of pain and discoloration of right third toe of few weeks duration.  Associated symptoms include discoloration. Pt has tried scraping off the callus with a razor blade to ease their symptoms.  Abnormal ABI 01/2021 of left large toe only. Patient's wife is with him today and reports he is extremely fatigued and seems depressed.  Patient becomes tearful today states he feels like he has no energy, and cannot complete tasks as he has had been able to do in the past.  He falls asleep very quickly throughout the day and feels that he requires a nap daily.  Wife states that he does snore and sleep with his mouth open.     01/13/2024    1:38 PM 09/24/2023    2:55 PM 02/28/2023    1:21 PM 09/04/2022    2:23 PM 08/29/2021    2:18 PM  Depression screen PHQ 2/9  Decreased Interest 1 1 1  0 0  Down, Depressed, Hopeless 2 1 1  0 1  PHQ - 2 Score 3 2 2  0 1  Altered sleeping 2 0 3    Tired, decreased energy 3 0 3    Change in appetite 0 0 0    Feeling bad or failure about yourself  1 0 1    Trouble concentrating 1 0 2    Moving slowly or fidgety/restless 0 0 0    Suicidal thoughts 0 0 0    PHQ-9 Score 10 2 11     Difficult doing work/chores Somewhat difficult Not difficult at all       No Known Allergies Social History   Social History Narrative   Marital status/children/pets: Married    Education/employment: retired   Past Medical History:  Diagnosis Date   Abnormal ankle brachial index (ABI) 07/05/2019   ABI of the toe, at home health visit was abnormal for severe PAD-reading obtained left large toe which has callus formation.   Abnormal echocardiogram 05/13/2019   Aortic valve calcification 05/13/2019   Arteriosclerosis of abdominal aorta (HCC) 01/17/2020   Arthralgia of left temporomandibular joint 01/07/2023   BPH (benign prostatic hyperplasia) 04/23/2007   Qualifier: Diagnosis of  By: Rachell Budge RN, Arlena Belts    BPH with obstruction/lower urinary tract symptoms 04/23/2007   Qualifier: Diagnosis of   By: Rachell Budge RN, Arlena Belts        Carotid atherosclerosis 01/21/2020   Chronic cough 07/05/2019   Chronic swimmer's ear of both sides 06/14/2016   Degeneration of lumbar intervertebral disc 12/09/2019   Dyslipidemia 04/23/2007   Qualifier: Diagnosis of  By: Royanne Core    Elevated PSA 03/12/2023   Essential hypertension 06/02/2008   Qualifier: Diagnosis of  By: Minnette Amato  MD, Ronie Cohen    Hypervitaminosis D 07/02/2022   Hypothyroidism  05/13/2019   Kidney stones    LAE (left atrial enlargement) 05/13/2019   Mild aortic stenosis 08/07/2021   Moderate mitral regurgitation 08/07/2021   Nephrolithiasis 12/25/2022   Neuropathy    s/p trauma to left lower ext (horse accident)   Osteoarthritis 04/23/2007   Qualifier: Diagnosis of  By: Rachell Budge RN, Arlena Belts    Otalgia of left ear 01/07/2023   Palpitations 02/04/2008   Qualifier: Diagnosis of  By: Minnette Amato  MD, Ronie Cohen    Pedal edema 01/21/2023   Sensorineural hearing loss (SNHL), bilateral 06/14/2016   TIA (transient ischemic attack) 05/13/2019   Tinnitus 02/28/2010   deaf left ear(no hearing)   Visual field defect 04/29/2019   Past Surgical History:  Procedure Laterality Date   APPENDECTOMY     CATARACT EXTRACTION Bilateral 04/2020   COLONOSCOPY     HERNIA REPAIR     left foot surgery for fx  Jul 30, 2011    LITHOTRIPSY  1992   cystoscopy with stent placement also done(1 stone fragment remains)   right shoulder replacement  Sep 28, 2009   TOTAL KNEE ARTHROPLASTY Right 08/30/2013   Procedure: RIGHT TOTAL KNEE ARTHROPLASTY RIGHT ;  Surgeon: Aurther Blue, MD;  Location: [facility];  Service: Orthopedics;  Laterality: Right;   TOTAL SHOULDER ARTHROPLASTY Left 02/02/2015   Procedure: LEFT TOTAL SHOULDER ARTHROPLASTY;  Surgeon: Ellard Gunning, MD;  Location: [facility];  Service: Orthopedics;  Laterality: Left;   TRANSURETHRAL RESECTION OF PROSTATE  03/27/2012   Procedure: TRANSURETHRAL RESECTION OF THE PROSTATE WITH GYRUS INSTRUMENTS;  Surgeon: Mark C Ottelin, MD;  Location: [facility];  Service: Urology;;   Family History  Problem Relation Age of Onset   Heart disease Mother 72       MI   Stroke Father 10   COPD Sister    Diabetes Sister    Diabetes Brother    Allergies as of 01/13/2024   No Known Allergies      Medication List        Accurate as of January 13, 2024  4:25 PM. If you have any questions, ask your nurse or doctor.          ascorbic acid  500 MG tablet Commonly known as: VITAMIN C  Take 500 mg by mouth at bedtime.   aspirin  81 MG chewable tablet Chew 81 mg by mouth daily.   celecoxib  200 MG capsule Commonly known as: CELEBREX  Take 1 capsule (200 mg total) by mouth daily.   cholecalciferol  25 MCG (1000 UNIT) tablet Commonly known as: VITAMIN D3 Take 1,000 Units by mouth daily.   CoQ10 200 MG Caps Take 200 mg by mouth at bedtime.   DULoxetine 20 MG capsule Commonly known as: Cymbalta Take 1 capsule (20 mg total) by mouth daily. Started by: Napolean Backbone   fexofenadine 180 MG tablet Commonly known as: ALLEGRA Take 180 mg by mouth daily.   fluocinolone  0.01 % external solution Commonly known as: SYNALAR  Apply topically 2 (two) times daily.   fluticasone  50 MCG/ACT nasal spray Commonly known as: FLONASE  Place 2 sprays into both nostrils daily.   furosemide  20 MG  tablet Commonly known as: LASIX  Take 20 mg by mouth daily.   gabapentin  600 MG tablet Commonly known as: NEURONTIN  Take 1 tablet (600 mg total) by mouth 2 (two) times daily.   levothyroxine  50 MCG tablet Commonly known as: SYNTHROID  Take 1 tablet (50 mcg total) by mouth daily before breakfast. Avoid meal at least 30 minutes after taking medication.  METAMUCIL FIBER PO Take 2 capsules by mouth daily with breakfast.   multivitamin tablet Take 1 tablet by mouth daily.   potassium citrate  10 MEQ (1080 MG) SR tablet Commonly known as: UROCIT-K  Take 2 tablets (20 mEq total) by mouth 2 (two) times daily with a meal.   simvastatin  40 MG tablet Commonly known as: ZOCOR  Take 1 tablet (40 mg total) by mouth daily at 6 PM.   Zinc 20 MG Caps Take 30 mg by mouth daily.        All past medical history, surgical history, allergies, family history, immunizations andmedications were updated in the EMR today and reviewed under the history and medication portions of their EMR.     ROS Negative, with the exception of above mentioned in HPI   Objective:  BP 130/80   Pulse 74   Wt 185 lb 3.2 oz (84 kg)   SpO2 98%   BMI 27.35 kg/m  Body mass index is 27.35 kg/m.  Physical Exam Vitals and nursing note reviewed.  Constitutional:      General: He is not in acute distress.    Appearance: Normal appearance. He is not ill-appearing, toxic-appearing or diaphoretic.  HENT:     Head: Normocephalic and atraumatic.  Eyes:     General: No scleral icterus.       Right eye: No discharge.        Left eye: No discharge.     Extraocular Movements: Extraocular movements intact.     Pupils: Pupils are equal, round, and reactive to light.  Cardiovascular:     Pulses: Decreased pulses.     Comments: Decreased pulses bilateral PT/DP Skin:    General: Skin is warm and dry.     Capillary Refill: Capillary refill takes less than 2 seconds.     Coloration: Skin is not jaundiced or pale.      Findings: Lesion (3rd right toe with thick callus formation/pressure ulceration) present. No rash.  Neurological:     Mental Status: He is alert and oriented to person, place, and time. Mental status is at baseline.  Psychiatric:        Mood and Affect: Mood normal. Affect is tearful.        Speech: Speech normal.        Behavior: Behavior normal.        Thought Content: Thought content normal.        Cognition and Memory: Cognition and memory normal.        Judgment: Judgment normal.      No results found. No results found. No results found for this or any previous visit (from the past 24 hours).  Assessment/Plan: [name] [name] is a 88 y.o. male present for OV for  Callus of toe (Primary)/toe pain/neuropathy Referral to podiatry to address second toe callus/ulceration. Discussed obtaining vascular/ABI studies of bilateral legs with decreased circulation signs present and worsening neuropathy sensation.  Which she still describes as a muscle discomfort weakness.  He is agreeable to move forward with this today.  Has had an abnormal ABI back in 2022 who of the left great toe. - Ambulatory referral to Podiatry - VAS US  ABI WITH/WO TBI; Future   Daytime somnolence/snoring Patient wife reports he does snore at night, sleeps with his mouth open.  Falls asleep very quickly throughout the day.  Patient states he has no energy, feels weak, decreased stamina - Ambulatory referral to Pulmonology  Reviewed expectations re: course of current medical issues. Discussed self-management  of symptoms. Outlined signs and symptoms indicating need for more acute intervention. Patient verbalized understanding and all questions were answered. Patient received an After-Visit Summary.    Orders Placed This Encounter  Procedures   Ambulatory referral to Podiatry   Ambulatory referral to Pulmonology   VAS US  ABI WITH/WO TBI   Meds ordered this encounter  Medications   DULoxetine (CYMBALTA) 20  MG capsule    Sig: Take 1 capsule (20 mg total) by mouth daily.    Dispense:  90 capsule    Refill:  1   Referral Orders         Ambulatory referral to Podiatry         Ambulatory referral to Pulmonology       Note is dictated utilizing voice recognition software. Although note has been proof read prior to signing, occasional typographical errors still can be missed. If any questions arise, please do not hesitate to call for verification.   electronically signed by:  Napolean Backbone, DO  [facility] - [place]

## 2024-01-13 NOTE — Patient Instructions (Addendum)
 B-complex vitamin  Podiatry referral placed Sleep study Start cymbalta Circulation studies for your lower extremities ordered.   Ankle-Brachial Index Test: What to Know Why am I having this test? The ankle-brachial index (ABI) test checks how well blood is flowing in your legs. This test is used to diagnose peripheral vascular disease (PVD). PVD is also known as peripheral arterial disease (PAD). PVD is when the blood vessels beyond the heart get blocked or harden. What is being tested? The ABI test measures the blood flow in your arms and legs and compares the results. This test shows if blood vessels in your legs are narrow or stiff. How do I prepare for this test? Wear loose, comfortable clothing with shoes that are easy to take off. Do not smoke, vape, or use nicotine or tobacco for at least 30 minutes before the test. Avoid caffeine, alcohol, and lot of activity an hour before the test. What happens during the test?  You will lie on your back with your legs and arms at the level of your heart. You may be told to rest for at least 10 minutes before the test starts. A blood pressure (BP) machine and a small ultrasound device will be used to listen to the blood flow in your arteries. Your BP will be taken several times on both arms and ankles. Your toe may also be tested. Your systolic blood pressures will be recorded. The systolic blood pressure (SBP) is the top number of your BP. This is the pressure inside your arteries when your heart pumps. The highest SBP of the ankle will be divided by the highest SBP of the arm. The result is the ABI. Sometimes, this test will be repeated after you have exercised on a treadmill for 5 minutes. This may cause leg pain. If the index number drops after exercise, this may show that PVD is present. How are the results reported? Your results will be compared with normal results for this test. Each lab has its own range for what's normal. Most lab  reports include the normal ranges for each test and a note telling you if yours is high or low. Ask when your test results will be ready and how to get them. You may need to call or meet with your provider to get your results. What do the results mean? An ABI of 1-1.4 is considered normal. An ABI that is 0.9 or less is usually considered not normal. An ABI that is below the normal range may may mean you have PVD in your legs. Talk with your provider about what your results mean. More tests may be needed. Talk with your health care provider about what your results mean. (In some cases, your health care provider may do more testing to confirm the results.) Questions to ask your health care provider Ask your provider or the department that is doing the test: When will my results be ready? How will I get my results? What are my treatment options? What other tests do I need? What are my next steps? This information is not intended to replace advice given to you by your health care provider. Make sure you discuss any questions you have with your health care provider. Document Revised: 02/17/2023 Document Reviewed: 02/17/2023 Elsevier Patient Education  2024 [employer].

## 2024-01-19 ENCOUNTER — Ambulatory Visit: Admitting: Podiatry

## 2024-01-19 DIAGNOSIS — M79674 Pain in right toe(s): Secondary | ICD-10-CM

## 2024-01-19 DIAGNOSIS — L84 Corns and callosities: Secondary | ICD-10-CM | POA: Diagnosis not present

## 2024-01-19 DIAGNOSIS — B351 Tinea unguium: Secondary | ICD-10-CM

## 2024-01-19 DIAGNOSIS — M2041 Other hammer toe(s) (acquired), right foot: Secondary | ICD-10-CM

## 2024-01-19 DIAGNOSIS — I739 Peripheral vascular disease, unspecified: Secondary | ICD-10-CM

## 2024-01-19 DIAGNOSIS — M79675 Pain in left toe(s): Secondary | ICD-10-CM | POA: Diagnosis not present

## 2024-01-19 NOTE — Progress Notes (Unsigned)
 Subjective:  Patient ID: [name] [name], male    DOB: [birth year],  MRN: [id number]  [name] [name] presents to clinic today for:  Chief Complaint  Patient presents with   Callouses    Callouse R4 distal. 2 pain. Non diabetic   Patient notes nails are thick and elongated, causing pain in shoe gear when ambulating.  He also notes a painful corn on the tip of the right third toe.  He is interested in getting this fixed and wants to know what his options are.  PCP is Kuneff, Renee A, DO.  Last seen 01/13/2024  Past Medical History:  Diagnosis Date   Abnormal ankle brachial index (ABI) 07/05/2019   ABI of the toe, at home health visit was abnormal for severe PAD-reading obtained left large toe which has callus formation.   Abnormal echocardiogram 05/13/2019   Aortic valve calcification 05/13/2019   Arteriosclerosis of abdominal aorta (HCC) 01/17/2020   Arthralgia of left temporomandibular joint 01/07/2023   BPH (benign prostatic hyperplasia) 04/23/2007   Qualifier: Diagnosis of  By: Rachell Budge RN, Arlena Belts    BPH with obstruction/lower urinary tract symptoms 04/23/2007   Qualifier: Diagnosis of   By: Rachell Budge RN, Arlena Belts        Carotid atherosclerosis 01/21/2020   Chronic cough 07/05/2019   Chronic swimmer's ear of both sides 06/14/2016   Degeneration of lumbar intervertebral disc 12/09/2019   Dyslipidemia 04/23/2007   Qualifier: Diagnosis of  By: Royanne Core    Elevated PSA 03/12/2023   Essential hypertension 06/02/2008   Qualifier: Diagnosis of  By: Minnette Amato  MD, Ronie Cohen    Hypervitaminosis D 07/02/2022   Hypothyroidism 05/13/2019   Kidney stones    LAE (left atrial enlargement) 05/13/2019   Mild aortic stenosis 08/07/2021   Moderate mitral regurgitation 08/07/2021   Nephrolithiasis 12/25/2022   Neuropathy    s/p trauma to left lower ext (horse accident)   Osteoarthritis 04/23/2007   Qualifier: Diagnosis of  By: Rachell Budge RN, Arlena Belts    Otalgia of left ear  01/07/2023   Palpitations 02/04/2008   Qualifier: Diagnosis of  By: Minnette Amato  MD, Ronie Cohen    Pedal edema 01/21/2023   Sensorineural hearing loss (SNHL), bilateral 06/14/2016   TIA (transient ischemic attack) 05/13/2019   Tinnitus 02/28/2010   deaf left ear(no hearing)   Visual field defect 04/29/2019   Objective:  DENILSON LANGELAND is a pleasant 88 y.o. male in NAD. AAO x 3.  Vascular Examination: Patient has palpable DP pulse, absent PT pulse bilateral.  Delayed capillary refill bilateral toes.  Sparse digital hair bilateral.  Proximal to distal cooling WNL bilateral.    Dermatological Examination: Interspaces are clear with no open lesions noted bilateral.  Skin is shiny and atrophic bilateral.  Nails are 3-30mm thick, with yellowish/brown discoloration, subungual debris and distal onycholysis x10.  There is pain with compression of nails x10.  There are hyperkeratotic lesions noted on the distal aspect of the right third toe with pain on palpation..  Neurological Examination: Protective sensation intact b/l LE. Vibratory sensation intact b/l LE.  Musculoskeletal Examination: Flexible contracture of the right third toe at the PIPJ  Patient qualifies for at-risk foot care because of mild PVD.  Assessment/Plan: 1. Pain due to onychomycosis of toenails of both feet   2. Hammertoe of right foot   3. Corns   4. PVD (peripheral vascular disease) (HCC)     Mycotic nails x10 were sharply  debrided with sterile nail nippers and power debriding burr to decrease bulk and length.  Hyperkeratotic lesion on the distal tip of the right third toe was shaved with #312 blade.  Patient informed that the corn was almost ulcerated at this time.  Recommended a percutaneous flexor tenotomy of the right third toe.  This could be performed under local anesthesia in our procedure room.  This is typically performed on a Monday morning.  Patient will consider this and call our office if he would like to  proceed.  This usually involves 1-2 stitches for closure, a light gauze dressing the day of the procedure, followed by Band-Aid at home each day to allow the patient to shower.  There is typically no restrictions other than the day of the procedure.  Return in about 3 months (around 04/20/2024) for RFC.  Patient to call if he like to set up the percutaneous flexor tenotomy of the right third toe at any time before his scheduled RFC.   [name], DPM, FACFAS [facility]     [address] [place], [place] [address]                Office [phone]  Fax [phone]

## 2024-01-21 ENCOUNTER — Ambulatory Visit: Payer: PPO | Attending: Cardiology | Admitting: Cardiology

## 2024-01-21 ENCOUNTER — Encounter: Payer: Self-pay | Admitting: Cardiology

## 2024-01-21 VITALS — BP 116/58 | HR 62 | Ht 69.0 in | Wt 185.1 lb

## 2024-01-21 DIAGNOSIS — I2510 Atherosclerotic heart disease of native coronary artery without angina pectoris: Secondary | ICD-10-CM | POA: Insufficient documentation

## 2024-01-21 DIAGNOSIS — R972 Elevated prostate specific antigen [PSA]: Secondary | ICD-10-CM | POA: Diagnosis not present

## 2024-01-21 DIAGNOSIS — I700 Atherosclerosis of aorta: Secondary | ICD-10-CM | POA: Diagnosis not present

## 2024-01-21 DIAGNOSIS — I350 Nonrheumatic aortic (valve) stenosis: Secondary | ICD-10-CM | POA: Diagnosis not present

## 2024-01-21 DIAGNOSIS — E782 Mixed hyperlipidemia: Secondary | ICD-10-CM | POA: Diagnosis not present

## 2024-01-21 NOTE — Patient Instructions (Signed)
 Medication Instructions:  Your physician recommends that you continue on your current medications as directed. Please refer to the Current Medication list given to you today.  *If you need a refill on your cardiac medications before your next appointment, please call your pharmacy*  Lab Work: Your physician recommends that you return for lab work in:   Labs today: BMP, CBC, TSH, LFT, Lipids, Hbg A1c, Vitamin D   If you have labs (blood work) drawn today and your tests are completely normal, you will receive your results only by: MyChart Message (if you have MyChart) OR A paper copy in the mail If you have any lab test that is abnormal or we need to change your treatment, we will call you to review the results.  Testing/Procedures: None  Follow-Up: At [facility], you and your health needs are our priority.  As part of our continuing mission to provide you with exceptional heart care, our providers are all part of one team.  This team includes your primary Cardiologist (physician) and Advanced Practice Providers or APPs (Physician Assistants and Nurse Practitioners) who all work together to provide you with the care you need, when you need it.  Your next appointment:   9 month(s)  Provider:   Hillis Lu, MD    We recommend signing up for the patient portal called "MyChart".  Sign up information is provided on this After Visit Summary.  MyChart is used to connect with patients for Virtual Visits (Telemedicine).  Patients are able to view lab/test results, encounter notes, upcoming appointments, etc.  Non-urgent messages can be sent to your provider as well.   To learn more about what you can do with MyChart, go to [email or web address].   Other Instructions None

## 2024-01-21 NOTE — Addendum Note (Signed)
 Addended by: Aurelio Leer I on: 01/21/2024 09:45 AM   Modules accepted: Orders

## 2024-01-21 NOTE — Progress Notes (Signed)
 Cardiology Office Note:    Date:  01/21/2024   ID:  [name] [name], DOB 23-Jun-1936, MRN [id number]  PCP:  Mariel Shope, DO  Cardiologist:  Nelia Balzarine, MD   Referring MD: Mariel Shope, DO    ASSESSMENT:    1. Mixed dyslipidemia   2. Mild aortic stenosis   3. Arteriosclerosis of abdominal aorta (HCC)   4. Elevated PSA   5. Coronary artery calcification   6. Aortic atherosclerosis (HCC)    PLAN:    In order of problems listed above:  Coronary artery disease: Secondary prevention stressed with the patient.  Importance of compliance with diet medication stressed and patient verbalized standing.  He is exercise and effort tolerance are excellent and I congratulated him about this. Mixed dyslipidemia: On lipid-lowering medication and is fasting and will have blood work today.  Diet emphasized. Mild aortic stenosis: Stable and asymptomatic.  Will continue to monitor. Patient will be seen in follow-up appointment in 9 months or earlier if the patient has any concerns.    Medication Adjustments/Labs and Tests Ordered: Current medicines are reviewed at length with the patient today.  Concerns regarding medicines are outlined above.  Orders Placed This Encounter  Procedures   EKG 12-Lead   No orders of the defined types were placed in this encounter.    No chief complaint on file.    History of Present Illness:    [name] [name] is a 88 y.o. male.  Patient has past medical history of aortic atherosclerosis, coronary artery calcifications, mixed dyslipidemia.  He denies any problems at this time and takes care of activities of daily living.  No chest pain orthopnea or PND.  He uses his exercise bicycle for an hour on a regular basis.  He has mildly symptomatic aortic stenosis.  At the time of my evaluation, the patient is alert awake oriented and in no distress.  Past Medical History:  Diagnosis Date   Abnormal ankle brachial index (ABI) 07/05/2019   ABI of the  toe, at home health visit was abnormal for severe PAD-reading obtained left large toe which has callus formation.   Abnormal echocardiogram 05/13/2019   Aortic valve calcification 05/13/2019   Arteriosclerosis of abdominal aorta (HCC) 01/17/2020   Arthralgia of left temporomandibular joint 01/07/2023   BPH (benign prostatic hyperplasia) 04/23/2007   Qualifier: Diagnosis of  By: Rachell Budge RN, Arlena Belts    BPH with obstruction/lower urinary tract symptoms 04/23/2007   Qualifier: Diagnosis of   By: Rachell Budge RN, Arlena Belts        Carotid atherosclerosis 01/21/2020   Chronic cough 07/05/2019   Chronic swimmer's ear of both sides 06/14/2016   Degeneration of lumbar intervertebral disc 12/09/2019   Dyslipidemia 04/23/2007   Qualifier: Diagnosis of  By: Royanne Core    Elevated PSA 03/12/2023   Essential hypertension 06/02/2008   Qualifier: Diagnosis of  By: Minnette Amato  MD, Ronie Cohen    Hypervitaminosis D 07/02/2022   Hypothyroidism 05/13/2019   Kidney stones    LAE (left atrial enlargement) 05/13/2019   Mild aortic stenosis 08/07/2021   Moderate mitral regurgitation 08/07/2021   Nephrolithiasis 12/25/2022   Neuropathy    s/p trauma to left lower ext (horse accident)   Osteoarthritis 04/23/2007   Qualifier: Diagnosis of  By: Rachell Budge RN, Arlena Belts    Otalgia of left ear 01/07/2023   Palpitations 02/04/2008   Qualifier: Diagnosis of  By: Minnette Amato  MD, Ronie Cohen    Pedal edema  01/21/2023   Sensorineural hearing loss (SNHL), bilateral 06/14/2016   TIA (transient ischemic attack) 05/13/2019   Tinnitus 02/28/2010   deaf left ear(no hearing)   Visual field defect 04/29/2019    Past Surgical History:  Procedure Laterality Date   APPENDECTOMY     CATARACT EXTRACTION Bilateral 04/2020   COLONOSCOPY     HERNIA REPAIR     left foot surgery for fx  Jul 30, 2011   LITHOTRIPSY  1992   cystoscopy with stent placement also done(1 stone fragment remains)   right shoulder replacement  Sep 28, 2009    TOTAL KNEE ARTHROPLASTY Right 08/30/2013   Procedure: RIGHT TOTAL KNEE ARTHROPLASTY RIGHT ;  Surgeon: Aurther Blue, MD;  Location: [facility];  Service: Orthopedics;  Laterality: Right;   TOTAL SHOULDER ARTHROPLASTY Left 02/02/2015   Procedure: LEFT TOTAL SHOULDER ARTHROPLASTY;  Surgeon: Ellard Gunning, MD;  Location: [facility];  Service: Orthopedics;  Laterality: Left;   TRANSURETHRAL RESECTION OF PROSTATE  03/27/2012   Procedure: TRANSURETHRAL RESECTION OF THE PROSTATE WITH GYRUS INSTRUMENTS;  Surgeon: Mark C Ottelin, MD;  Location: [facility];  Service: Urology;;    Current Medications: Current Meds  Medication Sig   aspirin  81 MG chewable tablet Chew 81 mg by mouth daily.   celecoxib  (CELEBREX ) 200 MG capsule Take 1 capsule (200 mg total) by mouth daily.   cholecalciferol  (VITAMIN D3) 25 MCG (1000 UNIT) tablet Take 1,000 Units by mouth daily.   Coenzyme Q10 (COQ10) 200 MG CAPS Take 200 mg by mouth at bedtime.   DULoxetine  (CYMBALTA ) 20 MG capsule Take 1 capsule (20 mg total) by mouth daily.   fexofenadine (ALLEGRA) 180 MG tablet Take 180 mg by mouth daily.   fluocinolone  (SYNALAR ) 0.01 % external solution Apply topically 2 (two) times daily.   fluticasone  (FLONASE ) 50 MCG/ACT nasal spray Place 2 sprays into both nostrils daily.   furosemide  (LASIX ) 20 MG tablet Take 20 mg by mouth daily.   gabapentin  (NEURONTIN ) 600 MG tablet Take 1 tablet (600 mg total) by mouth 2 (two) times daily.   levothyroxine  (SYNTHROID ) 50 MCG tablet Take 1 tablet (50 mcg total) by mouth daily before breakfast. Avoid meal at least 30 minutes after taking medication.   Multiple Vitamin (MULTIVITAMIN) tablet Take 1 tablet by mouth daily.   potassium citrate  (UROCIT-K ) 10 MEQ (1080 MG) SR tablet Take 2 tablets (20 mEq total) by mouth 2 (two) times daily with a meal.   Psyllium (METAMUCIL FIBER PO) Take 2 capsules by mouth daily with breakfast.   simvastatin  (ZOCOR ) 40 MG tablet Take 1 tablet (40 mg total) by mouth daily at 6  PM.   vitamin C  (ASCORBIC ACID ) 500 MG tablet Take 500 mg by mouth at bedtime.   Zinc 20 MG CAPS Take 30 mg by mouth daily.     Allergies:   Patient has no allergy information on record.   Social History   Socioeconomic History   Marital status: Married    Spouse name: Not on file   Number of children: Not on file   Years of education: Not on file   Highest education level: 12th grade  Occupational History   Not on file  Tobacco Use   Smoking status: Never   Smokeless tobacco: Never  Vaping Use   Vaping status: Never Used  Substance and Sexual Activity   Alcohol use: No   Drug use: No   Sexual activity: Yes    Partners: Female  Other Topics Concern  Not on file  Social History Narrative   Marital status/children/pets: Married   Education/employment: retired   Teacher, early years/pre Strain: Low Risk  (01/13/2024)   Overall Financial Resource Strain (CARDIA)    Difficulty of Paying Living Expenses: Not very hard  Food Insecurity: No Food Insecurity (01/13/2024)   Hunger Vital Sign    Worried About Running Out of Food in the Last Year: Never true    Ran Out of Food in the Last Year: Never true  Transportation Needs: No Transportation Needs (01/13/2024)   PRAPARE - Administrator, Civil Service (Medical): No    Lack of Transportation (Non-Medical): No  Physical Activity: Sufficiently Active (01/13/2024)   Exercise Vital Sign    Days of Exercise per Week: 5 days    Minutes of Exercise per Session: 60 min  Stress: Stress Concern Present (01/13/2024)   [employer] of Occupational Health - Occupational Stress Questionnaire    Feeling of Stress : Rather much  Social Connections: Socially Integrated (01/13/2024)   Social Connection and Isolation Panel [NHANES]    Frequency of Communication with Friends and Family: Three times a week    Frequency of Social Gatherings with Friends and Family: Once a week    Attends Religious Services:  More than 4 times per year    Active Member of [employer] or Organizations: Yes    Attends Engineer, structural: More than 4 times per year    Marital Status: Married     Family History: The patient's family history includes COPD in his sister; Diabetes in his brother and sister; Heart disease (age of onset: 66) in his mother; Stroke (age of onset: 18) in his father.  ROS:   Please see the history of present illness.    All other systems reviewed and are negative.  EKGs/Labs/Other Studies Reviewed:    The following studies were reviewed today: .Aaron AasEKG Interpretation Date/Time:  Wednesday Jan 21 2024 09:09:21 EDT Ventricular Rate:  62 PR Interval:  208 QRS Duration:  110 QT Interval:  426 QTC Calculation: 432 R Axis:   -27  Text Interpretation: Sinus rhythm with sinus arrhythmia with occasional Premature ventricular complexes Cannot rule out Anterior infarct , age undetermined When compared with ECG of 12-Oct-2019 12:13, Premature ventricular complexes are now Present QRS axis Shifted left Confirmed by Hillis Lu [id number]) on 01/21/2024 9:19:22 AM     Recent Labs: 02/19/2023: ALT 18; BUN 36; Creatinine, Ser 0.96; Hemoglobin 12.6; Platelets 196; Potassium 3.9; Sodium 141; TSH 3.960  Recent Lipid Panel    Component Value Date/Time   CHOL 156 02/19/2023 0822   TRIG 60 02/19/2023 0822   HDL 57 02/19/2023 0822   CHOLHDL 2.7 02/19/2023 0822   CHOLHDL 2.2 04/30/2019 0459   VLDL 7 04/30/2019 0459   LDLCALC 87 02/19/2023 0822   LDLDIRECT 109.8 06/05/2011 0959    Physical Exam:    VS:  BP (!) 116/58   Pulse 62   Ht 5\' 9"  (1.753 m)   Wt 185 lb 1.3 oz (84 kg)   SpO2 97%   BMI 27.33 kg/m     Wt Readings from Last 3 Encounters:  01/21/24 185 lb 1.3 oz (84 kg)  01/13/24 185 lb 3.2 oz (84 kg)  08/27/23 182 lb 6.4 oz (82.7 kg)     GEN: Patient is in no acute distress HEENT: Normal NECK: No JVD; No carotid bruits LYMPHATICS: No lymphadenopathy CARDIAC: Hear sounds  regular, 2/6 systolic murmur  at the apex. RESPIRATORY:  Clear to auscultation without rales, wheezing or rhonchi  ABDOMEN: Soft, non-tender, non-distended MUSCULOSKELETAL:  No edema; No deformity  SKIN: Warm and dry NEUROLOGIC:  Alert and oriented x 3 PSYCHIATRIC:  Normal affect   Signed, Nelia Balzarine, MD  01/21/2024 9:28 AM    [facility]

## 2024-01-22 ENCOUNTER — Encounter: Payer: Self-pay | Admitting: Podiatry

## 2024-02-01 LAB — BASIC METABOLIC PANEL WITH GFR
BUN/Creatinine Ratio: 28 — ABNORMAL HIGH (ref 10–24)
BUN: 27 mg/dL (ref 8–27)
CO2: 24 mmol/L (ref 20–29)
Calcium: 9.4 mg/dL (ref 8.6–10.2)
Chloride: 101 mmol/L (ref 96–106)
Creatinine, Ser: 0.97 mg/dL (ref 0.76–1.27)
Glucose: 97 mg/dL (ref 70–99)
Potassium: 4.6 mmol/L (ref 3.5–5.2)
Sodium: 141 mmol/L (ref 134–144)
eGFR: 77 mL/min/{1.73_m2} (ref >59)

## 2024-02-01 LAB — LIPID PANEL
Chol/HDL Ratio: 2.8 ratio (ref 0.0–5.0)
Cholesterol, Total: 154 mg/dL (ref 100–199)
HDL: 56 mg/dL (ref >39)
LDL Chol Calc (NIH): 84 mg/dL (ref 0–99)
Triglycerides: 69 mg/dL (ref 0–149)
VLDL Cholesterol Cal: 14 mg/dL (ref 5–40)

## 2024-02-01 LAB — CBC
Hematocrit: 40.9 % (ref 37.5–51.0)
Hemoglobin: 13.8 g/dL (ref 13.0–17.7)
MCH: 31.9 pg (ref 26.6–33.0)
MCHC: 33.7 g/dL (ref 31.5–35.7)
MCV: 95 fL (ref 79–97)
Platelets: 198 10*3/uL (ref 150–450)
RBC: 4.33 x10E6/uL (ref 4.14–5.80)
RDW: 13 % (ref 11.6–15.4)
WBC: 7.3 10*3/uL (ref 3.4–10.8)

## 2024-02-01 LAB — TSH: TSH: 3.03 u[IU]/mL (ref 0.450–4.500)

## 2024-02-01 LAB — HEPATIC FUNCTION PANEL
ALT: 21 IU/L (ref 0–44)
AST: 25 IU/L (ref 0–40)
Albumin: 4.7 g/dL (ref 3.7–4.7)
Alkaline Phosphatase: 83 IU/L (ref 44–121)
Bilirubin Total: 0.6 mg/dL (ref 0.0–1.2)
Bilirubin, Direct: 0.22 mg/dL (ref 0.00–0.40)
Total Protein: 6.9 g/dL (ref 6.0–8.5)

## 2024-02-01 LAB — VITAMIN D 1,25 DIHYDROXY
Vitamin D 1, 25 (OH)2 Total: 52 pg/mL
Vitamin D2 1, 25 (OH)2: <10 pg/mL
Vitamin D3 1, 25 (OH)2: 52 pg/mL

## 2024-02-01 LAB — HEMOGLOBIN A1C
Est. average glucose Bld gHb Est-mCnc: 117 mg/dL
Hgb A1c MFr Bld: 5.7 % — ABNORMAL HIGH (ref 4.8–5.6)

## 2024-02-03 ENCOUNTER — Ambulatory Visit: Payer: Self-pay | Admitting: Cardiology

## 2024-02-10 ENCOUNTER — Other Ambulatory Visit: Payer: Self-pay | Admitting: Cardiology

## 2024-02-23 ENCOUNTER — Ambulatory Visit: Payer: Self-pay | Admitting: Family Medicine

## 2024-02-23 ENCOUNTER — Ambulatory Visit (HOSPITAL_BASED_OUTPATIENT_CLINIC_OR_DEPARTMENT_OTHER)
Admission: RE | Admit: 2024-02-23 | Discharge: 2024-02-23 | Disposition: A | Discharge status: Home / Self Care | Source: Ambulatory Visit | Attending: Family Medicine | Admitting: Family Medicine

## 2024-02-23 DIAGNOSIS — M79674 Pain in right toe(s): Secondary | ICD-10-CM | POA: Diagnosis not present

## 2024-02-23 DIAGNOSIS — G8929 Other chronic pain: Secondary | ICD-10-CM | POA: Insufficient documentation

## 2024-02-23 DIAGNOSIS — L84 Corns and callosities: Secondary | ICD-10-CM | POA: Insufficient documentation

## 2024-02-23 DIAGNOSIS — G629 Polyneuropathy, unspecified: Secondary | ICD-10-CM | POA: Insufficient documentation

## 2024-02-23 LAB — VAS US ABI WITH/WO TBI
Left ABI: 1.3
Right ABI: 1.26

## 2024-03-02 ENCOUNTER — Encounter: Payer: Self-pay | Admitting: Family Medicine

## 2024-03-02 ENCOUNTER — Ambulatory Visit: Payer: PPO | Admitting: Family Medicine

## 2024-03-02 ENCOUNTER — Ambulatory Visit: Payer: Self-pay | Admitting: Family Medicine

## 2024-03-02 VITALS — BP 128/68 | HR 73 | Temp 98.0°F | Ht 69.0 in | Wt 183.0 lb

## 2024-03-02 DIAGNOSIS — E673 Hypervitaminosis D: Secondary | ICD-10-CM

## 2024-03-02 DIAGNOSIS — G459 Transient cerebral ischemic attack, unspecified: Secondary | ICD-10-CM

## 2024-03-02 DIAGNOSIS — I700 Atherosclerosis of aorta: Secondary | ICD-10-CM

## 2024-03-02 DIAGNOSIS — Z125 Encounter for screening for malignant neoplasm of prostate: Secondary | ICD-10-CM

## 2024-03-02 DIAGNOSIS — I340 Nonrheumatic mitral (valve) insufficiency: Secondary | ICD-10-CM

## 2024-03-02 DIAGNOSIS — G629 Polyneuropathy, unspecified: Secondary | ICD-10-CM

## 2024-03-02 DIAGNOSIS — M150 Primary generalized (osteo)arthritis: Secondary | ICD-10-CM | POA: Diagnosis not present

## 2024-03-02 DIAGNOSIS — I359 Nonrheumatic aortic valve disorder, unspecified: Secondary | ICD-10-CM

## 2024-03-02 DIAGNOSIS — E785 Hyperlipidemia, unspecified: Secondary | ICD-10-CM | POA: Diagnosis not present

## 2024-03-02 DIAGNOSIS — I10 Essential (primary) hypertension: Secondary | ICD-10-CM

## 2024-03-02 DIAGNOSIS — Z0000 Encounter for general adult medical examination without abnormal findings: Secondary | ICD-10-CM

## 2024-03-02 DIAGNOSIS — M5136 Other intervertebral disc degeneration, lumbar region with discogenic back pain only: Secondary | ICD-10-CM | POA: Diagnosis not present

## 2024-03-02 DIAGNOSIS — I350 Nonrheumatic aortic (valve) stenosis: Secondary | ICD-10-CM

## 2024-03-02 DIAGNOSIS — E034 Atrophy of thyroid (acquired): Secondary | ICD-10-CM

## 2024-03-02 LAB — PSA, MEDICARE: PSA: 6.06 ng/mL — ABNORMAL HIGH (ref 0.10–4.00)

## 2024-03-02 MED ORDER — POTASSIUM CITRATE ER 10 MEQ (1080 MG) PO TBCR: 20.0000 meq | EXTENDED_RELEASE_TABLET | Freq: Two times a day (BID) | ORAL | 1 refills | Status: DC

## 2024-03-02 MED ORDER — SIMVASTATIN 40 MG PO TABS: 40.0000 mg | ORAL_TABLET | Freq: Every day | ORAL | 3 refills | Status: AC

## 2024-03-02 MED ORDER — CELECOXIB 200 MG PO CAPS: 200.0000 mg | ORAL_CAPSULE | Freq: Every day | ORAL | 1 refills | Status: DC

## 2024-03-02 MED ORDER — DULOXETINE HCL 20 MG PO CPEP: 20.0000 mg | ORAL_CAPSULE | Freq: Every day | ORAL | 1 refills | Status: DC

## 2024-03-02 MED ORDER — LEVOTHYROXINE SODIUM 50 MCG PO TABS: 50.0000 ug | ORAL_TABLET | Freq: Every day | ORAL | 3 refills | Status: AC

## 2024-03-02 MED ORDER — GABAPENTIN 600 MG PO TABS: 600.0000 mg | ORAL_TABLET | Freq: Two times a day (BID) | ORAL | 1 refills | Status: DC

## 2024-03-02 NOTE — Patient Instructions (Addendum)

## 2024-03-02 NOTE — Progress Notes (Signed)
Patient Care Team    Relationship Specialty Notifications Start End  Mariel Shope, DO PCP - General Family Medicine  01/19/19   Gaynelle Keeling, MD Consulting Physician Dermatology  01/19/20   Revankar, Micael Adas, MD Consulting Physician Cardiology  01/19/20   Amos Balint Emerge  Specialist  01/19/20   Ric Chain  Optometry  01/19/20    Chief Complaint  Patient presents with   Annual Exam    chronic medical condition management. Pt is not fasting.   SUBJECTIVE HPI: [name] [name] is a 88 y.o. male present for Cpe and chronic medical condition management. All past medical history, surgical history, allergies, family history, immunizations and social history was obtained from the patient today and entered into the electronic medical record.   Immunizations: Influenza UTD , Tdap UTD 01/2016, PNA completed, Shingrix completed.  COVID vaccines received  Hypertension/hyperlipidemia: Pt reports compliance  with Zocor  40 mg nightly and lasix  20 qd.   Patient denies chest pain, shortness of breath, dizziness or lower extremity edema.    Pt takes a daily baby ASA. Pt is  prescribed statin. Diet: Monitors his diet Exercise: Routine exercise RF: Hypertension, hyperlipidemia, family history of heart disease and stroke  BPH/arthritis/neuropathy: Patient has a history of arthritis status post shoulder replacement and knee arthroplasty.  He also has a history of BPH/s/p TURP.  He states he takes Celebrex  200 mg daily and this medication has been helpful for his BPH and his arthritis.[name] [name]  He did try the Flomax  and it was not helpful.   He sustained a lower extremity fracture injury in which he has chronic neuropathy.  Celebrex  200 mg daily and gabapentin  600 mg twice daily is very helpful for pain and keeps him active. ROS: See pertinent positives and negatives per HPI.    Patient Active Problem List   Diagnosis Date Noted   Aortic atherosclerosis (HCC) 01/21/2024   Elevated PSA 03/12/2023    Pedal edema 01/21/2023   Arthralgia of left temporomandibular joint 01/07/2023   Nephrolithiasis 12/25/2022   Hypervitaminosis D 07/02/2022   Mild aortic stenosis 08/07/2021   Moderate mitral regurgitation 08/07/2021   Carotid atherosclerosis 01/21/2020   Arteriosclerosis of abdominal aorta (HCC) 01/17/2020   Degeneration of lumbar intervertebral disc 12/09/2019   Chronic cough 07/05/2019   LAE (left atrial enlargement) 05/13/2019   Aortic valve calcification 05/13/2019   TIA (transient ischemic attack) 05/13/2019   Hypothyroidism 05/13/2019   Abnormal echocardiogram 05/13/2019   Visual field defect 04/29/2019   Neuropathy- chronic s/p trauma 01/20/2019   Sensorineural hearing loss (SNHL), bilateral 06/14/2016   Tinnitus 02/28/2010   Essential hypertension 06/02/2008   Palpitations 02/04/2008   Dyslipidemia 04/23/2007   BPH with obstruction/lower urinary tract symptoms 04/23/2007    Class: Present on Admission   Osteoarthritis 04/23/2007    Social History   Tobacco Use   Smoking status: Never   Smokeless tobacco: Never  Substance Use Topics   Alcohol use: No    Current Outpatient Medications:    aspirin  81 MG chewable tablet, Chew 81 mg by mouth daily., Disp: , Rfl:    cholecalciferol  (VITAMIN D3) 25 MCG (1000 UNIT) tablet, Take 1,000 Units by mouth daily., Disp: , Rfl:    Coenzyme Q10 (COQ10) 200 MG CAPS, Take 200 mg by mouth at bedtime., Disp: , Rfl:    fexofenadine (ALLEGRA) 180 MG tablet, Take 180 mg by mouth daily., Disp: , Rfl:    fluocinolone  (SYNALAR ) 0.01 % external solution, Apply topically 2 (  two) times daily., Disp: 60 mL, Rfl: 1   fluticasone  (FLONASE ) 50 MCG/ACT nasal spray, Place 2 sprays into both nostrils daily., Disp: 16 g, Rfl: 6   furosemide  (LASIX ) 20 MG tablet, Take 1 tablet (20 mg total) by mouth daily., Disp: 90 tablet, Rfl: 3   Multiple Vitamin (MULTIVITAMIN) tablet, Take 1 tablet by mouth daily., Disp: , Rfl:    Psyllium (METAMUCIL FIBER PO),  Take 2 capsules by mouth daily with breakfast., Disp: , Rfl:    vitamin C  (ASCORBIC ACID ) 500 MG tablet, Take 500 mg by mouth at bedtime., Disp: , Rfl:    Zinc 20 MG CAPS, Take 30 mg by mouth daily., Disp: , Rfl:    celecoxib  (CELEBREX ) 200 MG capsule, Take 1 capsule (200 mg total) by mouth daily., Disp: 90 capsule, Rfl: 1   DULoxetine  (CYMBALTA ) 20 MG capsule, Take 1 capsule (20 mg total) by mouth daily., Disp: 90 capsule, Rfl: 1   gabapentin  (NEURONTIN ) 600 MG tablet, Take 1 tablet (600 mg total) by mouth 2 (two) times daily., Disp: 180 tablet, Rfl: 1   levothyroxine  (SYNTHROID ) 50 MCG tablet, Take 1 tablet (50 mcg total) by mouth daily before breakfast. Avoid meal at least 30 minutes after taking medication., Disp: 90 tablet, Rfl: 3   potassium citrate  (UROCIT-K ) 10 MEQ (1080 MG) SR tablet, Take 2 tablets (20 mEq total) by mouth 2 (two) times daily with a meal., Disp: 360 tablet, Rfl: 1   simvastatin  (ZOCOR ) 40 MG tablet, Take 1 tablet (40 mg total) by mouth daily at 6 PM., Disp: 90 tablet, Rfl: 3  Not on File  OBJECTIVE: BP 128/68   Pulse 73   Temp 98 F (36.7 C)   Ht 5' 9 (1.753 m)   Wt 183 lb (83 kg)   SpO2 96%   BMI 27.02 kg/m  Physical Exam Vitals and nursing note reviewed.  Constitutional:      General: He is not in acute distress.    Appearance: Normal appearance. He is not ill-appearing, toxic-appearing or diaphoretic.  HENT:     Head: Normocephalic and atraumatic.     Right Ear: Tympanic membrane, ear canal and external ear normal.     Left Ear: Tympanic membrane, ear canal and external ear normal.     Nose: No congestion or rhinorrhea.     Mouth/Throat:     Mouth: Mucous membranes are moist.     Pharynx: No oropharyngeal exudate or posterior oropharyngeal erythema.   Eyes:     General: No scleral icterus.       Right eye: No discharge.        Left eye: No discharge.     Extraocular Movements: Extraocular movements intact.     Pupils: Pupils are equal, round, and  reactive to light.    Cardiovascular:     Rate and Rhythm: Normal rate and regular rhythm.     Heart sounds: Murmur heard.  Pulmonary:     Effort: Pulmonary effort is normal. No respiratory distress.     Breath sounds: Normal breath sounds. No wheezing, rhonchi or rales.  Abdominal:     General: Abdomen is flat.     Palpations: Abdomen is soft.   Musculoskeletal:     Cervical back: Neck supple. No tenderness.     Right lower leg: No edema.     Left lower leg: No edema.  Lymphadenopathy:     Cervical: No cervical adenopathy.   Skin:    General: Skin is warm.  Findings: No rash.   Neurological:     Mental Status: He is alert and oriented to person, place, and time. Mental status is at baseline.   Psychiatric:        Mood and Affect: Mood normal.        Behavior: Behavior normal.        Thought Content: Thought content normal.        Judgment: Judgment normal.    ASSESSMENT AND PLAN: RENDELL THIVIERGE is a 87 y.o. male present for cpe and Chronic Conditions/illness Management Benign prostatic hyperplasia without lower urinary tract symptoms/nephrolithiasis/elevated PSA Stable continue   celebrex . - flomax  tried and reported not helpful.  - continue potassium citrate  Est/w urology  Hypertension/edema/dyslipidemia/Essential hypertension/Overweight (BMI 25.0-29.9) Most recent visit cardiology  started >Lasix  20 mg daily  Continue Zocor  40 mg daily> [facility] - continue baby aspirin  Reviewed recent echo 02/19/2023 with EF 60% and diastolic 1 dysfunction.  Mild mitral regurg, mild aortic regurg and mild stenosis. - low sodium diet and routine exercise.   Neuropathy- chronic s/p trauma Stable-status post trauma to lower extremity. Continue  gabapentin  600 mg twice daily  Primary osteoarthritis involving multiple joints Stable  continue Celebrex   Hypothyroidism: Stable Continue  levo 50 mcg.  TSH normal 01/2024  Routine preventive exam: Patient was encouraged to  exercise greater than 150 minutes a week. Patient was encouraged to choose a diet filled with fresh fruits and vegetables, and lean meats. AVS provided to patient today for education/recommendation on gender specific health and safety maintenance. IMMUNIZATIONS utd Pt desired psa collection, he understands for age this is not recommended and that normal levels for his age are above the 4.0 threshold. His wife had a relative that had aggressive prostate cancer and they want the knowledge of his PSA levels.    Return in about 24 weeks (around 08/17/2024) for Routine chronic condition follow-up.  Orders Placed This Encounter  Procedures   PSA, Medicare ( [place] only)    Meds ordered this encounter  Medications   celecoxib  (CELEBREX ) 200 MG capsule    Sig: Take 1 capsule (200 mg total) by mouth daily.    Dispense:  90 capsule    Refill:  1   gabapentin  (NEURONTIN ) 600 MG tablet    Sig: Take 1 tablet (600 mg total) by mouth 2 (two) times daily.    Dispense:  180 tablet    Refill:  1    Hold until  pt request   levothyroxine  (SYNTHROID ) 50 MCG tablet    Sig: Take 1 tablet (50 mcg total) by mouth daily before breakfast. Avoid meal at least 30 minutes after taking medication.    Dispense:  90 tablet    Refill:  3   simvastatin  (ZOCOR ) 40 MG tablet    Sig: Take 1 tablet (40 mg total) by mouth daily at 6 PM.    Dispense:  90 tablet    Refill:  3   DULoxetine  (CYMBALTA ) 20 MG capsule    Sig: Take 1 capsule (20 mg total) by mouth daily.    Dispense:  90 capsule    Refill:  1   potassium citrate  (UROCIT-K ) 10 MEQ (1080 MG) SR tablet    Sig: Take 2 tablets (20 mEq total) by mouth 2 (two) times daily with a meal.    Dispense:  360 tablet    Refill:  1    Referral Orders  No referral(s) requested today       Napolean Backbone, DO  03/02/2024   

## 2024-03-07 NOTE — Progress Notes (Deleted)
 03/08/24- 87 yoM never smoker for sleep evaluation courtesy of Dr Catherine with concern of somnolence and snoring. Medical problem list includes ASCVD, HTN,TIA, Hypothyroid, DDD, BPH,

## 2024-03-09 ENCOUNTER — Ambulatory Visit: Admitting: Internal Medicine

## 2024-03-25 ENCOUNTER — Encounter: Payer: Self-pay | Admitting: Internal Medicine

## 2024-03-25 ENCOUNTER — Ambulatory Visit

## 2024-03-25 ENCOUNTER — Ambulatory Visit: Admitting: Internal Medicine

## 2024-03-25 VITALS — BP 126/78 | HR 67 | Temp 97.9°F | Ht 69.0 in | Wt 188.0 lb

## 2024-03-25 DIAGNOSIS — R053 Chronic cough: Secondary | ICD-10-CM

## 2024-03-25 DIAGNOSIS — R400 Somnolence: Secondary | ICD-10-CM | POA: Diagnosis not present

## 2024-03-25 DIAGNOSIS — K219 Gastro-esophageal reflux disease without esophagitis: Secondary | ICD-10-CM | POA: Diagnosis not present

## 2024-03-25 NOTE — Progress Notes (Signed)
 03/08/24- 88 yoM never smoker for sleep evaluation courtesy of Dr Catherine with concern of somnolence and snoring. Medical problem list includes ASCVD, HTN,TIA, Hypothyroid, DDD, BPH,  Epworth score-17 Body weight today 188 lbs Back pain- sleeps in recliner in separate room. Some snoring. No ENT surgery. Aware of heart murmur. Home tested + for Radon. Discussed the use of AI scribe software for clinical note transcription with the patient, who gave verbal consent to proceed. He is concerned about daytime somnolence-can't stay awake. Bedtime 10-10:30 PM, latency 5 minutes, waking once before up 4:45AM. Some chronic dry cough without dyspnea on exertion. Food tries to go down wrong way- suggests LPR/ GERD basis for cough. History of Present Illness   [name] [name] is an 88 year old male with sarcoidosis who presents with concerns about sleep apnea and persistent cough. He was referred by his primary care physician to evaluate for sleep apnea.  He experienced a collapse at home with a significant drop in pulse and unmeasurable blood pressure, requiring emergency medical attention. He did not lose consciousness during this event.  He has a persistent cough that worsens when reclining or turning his head to the left. Nighttime coughing occasionally occurs, relieved by cough drops and sitting upright. He uses antacids for heartburn and acid indigestion.  He uses a CPAP machine nightly for sleep apnea but finds it uncomfortable and sometimes skips use when visiting his daughter's house. He experiences daytime tiredness and restless legs, impacting his ability to maintain CPAP use. His CPAP score is adequate when the device is used consistently.     Prior to Admission medications   Medication Sig Start Date End Date Taking? Authorizing Provider  aspirin  81 MG chewable tablet Chew 81 mg by mouth daily.   Yes [provider]  celecoxib  (CELEBREX ) 200 MG capsule Take 1 capsule (200 mg  total) by mouth daily. 03/02/24  Yes Kuneff, Renee A, DO  cholecalciferol  (VITAMIN D3) 25 MCG (1000 UNIT) tablet Take 1,000 Units by mouth daily.   Yes [provider]  Coenzyme Q10 (COQ10) 200 MG CAPS Take 200 mg by mouth at bedtime.   Yes [provider]  DULoxetine  (CYMBALTA ) 20 MG capsule Take 1 capsule (20 mg total) by mouth daily. 03/02/24  Yes Kuneff, Renee A, DO  fexofenadine (ALLEGRA) 180 MG tablet Take 180 mg by mouth daily.   Yes [provider]  fluocinolone  (SYNALAR ) 0.01 % external solution Apply topically 2 (two) times daily. 09/13/22  Yes Kuneff, Renee A, DO  fluticasone  (FLONASE ) 50 MCG/ACT nasal spray Place 2 sprays into both nostrils daily. 08/27/23  Yes Kuneff, Renee A, DO  furosemide  (LASIX ) 20 MG tablet Take 1 tablet (20 mg total) by mouth daily. 02/11/24  Yes Revankar, Jennifer SAUNDERS, MD  gabapentin  (NEURONTIN ) 600 MG tablet Take 1 tablet (600 mg total) by mouth 2 (two) times daily. 03/02/24  Yes Kuneff, Renee A, DO  levothyroxine  (SYNTHROID ) 50 MCG tablet Take 1 tablet (50 mcg total) by mouth daily before breakfast. Avoid meal at least 30 minutes after taking medication. 03/02/24  Yes Kuneff, Renee A, DO  Multiple Vitamin (MULTIVITAMIN) tablet Take 1 tablet by mouth daily.   Yes [provider]  potassium citrate  (UROCIT-K ) 10 MEQ (1080 MG) SR tablet Take 2 tablets (20 mEq total) by mouth 2 (two) times daily with a meal. 03/02/24  Yes Kuneff, Renee A, DO  Psyllium (METAMUCIL FIBER PO) Take 2 capsules by mouth daily with breakfast.   Yes [provider]  simvastatin  (ZOCOR ) 40 MG tablet Take 1 tablet (40 mg total) by mouth daily at 6 PM. 03/02/24  Yes Kuneff, Renee A, DO  vitamin C  (ASCORBIC ACID ) 500 MG tablet Take 500 mg by mouth at bedtime.   Yes [provider]  Zinc 20 MG CAPS Take 30 mg by mouth daily.   Yes [provider]   Past Medical History:  Diagnosis Date   Abnormal ankle brachial index (ABI) 07/05/2019   ABI of  the toe, at home health visit was abnormal for severe PAD-reading obtained left large toe which has callus formation.   Abnormal echocardiogram 05/13/2019   Aortic valve calcification 05/13/2019   Arteriosclerosis of abdominal aorta (HCC) 01/17/2020   Arthralgia of left temporomandibular joint 01/07/2023   BPH (benign prostatic hyperplasia) 04/23/2007   Qualifier: Diagnosis of  By: Nunzio RN, Dagoberto Caldron    BPH with obstruction/lower urinary tract symptoms 04/23/2007   Qualifier: Diagnosis of   By: Nunzio RN, Dagoberto Caldron        Carotid atherosclerosis 01/21/2020   Chronic cough 07/05/2019   Chronic swimmer's ear of both sides 06/14/2016   Degeneration of lumbar intervertebral disc 12/09/2019   Dyslipidemia 04/23/2007   Qualifier: Diagnosis of  By: Nunzio OBIE Dagoberto Caldron    Elevated PSA 03/12/2023   Essential hypertension 06/02/2008   Qualifier: Diagnosis of  By: Jame  MD, Maude FALCON    Hypervitaminosis D 07/02/2022   Hypothyroidism 05/13/2019   Kidney stones    LAE (left atrial enlargement) 05/13/2019   Mild aortic stenosis 08/07/2021   Moderate mitral regurgitation 08/07/2021   Nephrolithiasis 12/25/2022   Neuropathy    s/p trauma to left lower ext (horse accident)   Osteoarthritis 04/23/2007   Qualifier: Diagnosis of  By: Nunzio RN, Dagoberto Caldron    Otalgia of left ear 01/07/2023   Palpitations 02/04/2008   Qualifier: Diagnosis of  By: Jame  MD, Maude FALCON    Pedal edema 01/21/2023   Sensorineural hearing loss (SNHL), bilateral 06/14/2016   TIA (transient ischemic attack) 05/13/2019   Tinnitus 02/28/2010   deaf left ear(no hearing)   Visual field defect 04/29/2019   Past Surgical History:  Procedure Laterality Date   APPENDECTOMY     CATARACT EXTRACTION Bilateral 04/2020   COLONOSCOPY     HERNIA REPAIR     left foot surgery for fx  Jul 30, 2011   LITHOTRIPSY  1992   cystoscopy with stent placement also done(1 stone fragment remains)   right shoulder replacement  Sep 28, 2009   TOTAL KNEE ARTHROPLASTY Right 08/30/2013   Procedure: RIGHT TOTAL KNEE ARTHROPLASTY RIGHT ;  Surgeon: Dempsey LULLA Moan, MD;  Location: [facility];  Service: Orthopedics;  Laterality: Right;   TOTAL SHOULDER ARTHROPLASTY Left 02/02/2015   Procedure: LEFT TOTAL SHOULDER ARTHROPLASTY;  Surgeon: Franky Pointer, MD;  Location: [facility];  Service: Orthopedics;  Laterality: Left;   TRANSURETHRAL RESECTION OF PROSTATE  03/27/2012   Procedure: TRANSURETHRAL RESECTION OF THE PROSTATE WITH GYRUS INSTRUMENTS;  Surgeon: Mark C Ottelin, MD;  Location: [facility];  Service: Urology;;   Family History  Problem Relation Age of Onset   Heart disease Mother 16       MI   Stroke Father 61   COPD Sister    Diabetes Sister    Diabetes Brother    Social History   Socioeconomic History   Marital status: Married    Spouse name: Not on file   Number of children: Not on file  Years of education: Not on file   Highest education level: 12th grade  Occupational History   Not on file  Tobacco Use   Smoking status: Never   Smokeless tobacco: Never  Vaping Use   Vaping status: Never Used  Substance and Sexual Activity   Alcohol use: No   Drug use: No   Sexual activity: Yes    Partners: Female  Other Topics Concern   Not on file  Social History Narrative   Marital status/children/pets: Married   Education/employment: retired   Chief Executive Officer Drivers of Corporate investment banker Strain: Low Risk  (03/01/2024)   Overall Financial Resource Strain (CARDIA)    Difficulty of Paying Living Expenses: Not hard at all  Food Insecurity: No Food Insecurity (03/01/2024)   Hunger Vital Sign    Worried About Running Out of Food in the Last Year: Never true    Ran Out of Food in the Last Year: Never true  Transportation Needs: No Transportation Needs (03/01/2024)   PRAPARE - Administrator, Civil Service (Medical): No    Lack of Transportation (Non-Medical): No  Physical Activity: Sufficiently Active (03/01/2024)    Exercise Vital Sign    Days of Exercise per Week: 5 days    Minutes of Exercise per Session: 60 min  Stress: Stress Concern Present (03/01/2024)   [employer] of Occupational Health - Occupational Stress Questionnaire    Feeling of Stress: To some extent  Social Connections: Socially Integrated (03/01/2024)   Social Connection and Isolation Panel    Frequency of Communication with Friends and Family: Twice a week    Frequency of Social Gatherings with Friends and Family: Once a week    Attends Religious Services: More than 4 times per year    Active Member of [employer] or Organizations: Yes    Attends Engineer, structural: More than 4 times per year    Marital Status: Married  Catering manager Violence: Not At Risk (09/24/2023)   Humiliation, Afraid, Rape, and Kick questionnaire    Fear of Current or Ex-Partner: No    Emotionally Abused: No    Physically Abused: No    Sexually Abused: No  Assessment and Plan:    Sleep apnea Chronic sleep apnea with CPAP use. Potential silent reflux contributing to symptoms. CPAP is the gold standard treatment. - Order home sleep study to assess current status. - Discuss CPAP use and potential alternatives if indicated.  Gastroesophageal reflux disease (GERD) Chronic GERD with heartburn and possible silent reflux contributing to nighttime coughing. Current medications reduce stomach acid but do not prevent reflux. - Order chest x-ray to evaluate for pulmonary complications.       ROS-see HPI   + = positive Constitutional:    weight loss, night sweats, fevers, chills, fatigue, lassitude. HEENT:    headaches, difficulty swallowing, tooth/dental problems, sore throat,       sneezing, itching, ear ache, nasal congestion, post nasal drip, snoring CV:    chest pain, orthopnea, PND, swelling in lower extremities, anasarca,                                   +dizziness, palpitations Resp:   shortness of breath with exertion or at rest.                 productive cough,   non-productive cough, coughing up of blood.  change in color of mucus.  wheezing.   Skin:    rash or lesions. GI:  No-   heartburn, indigestion, abdominal pain, nausea, vomiting, diarrhea,                 change in bowel habits, loss of appetite GU: dysuria, change in color of urine, no urgency or frequency.   flank pain. MS:   joint pain, stiffness, decreased range of motion, back pain. Neuro-     nothing unusual Psych:  change in mood or affect.  depression or anxiety.   memory loss.  OBJ- Physical Exam General- Alert, Oriented, Affect-appropriate, Distress- none acute, not obese Skin- rash-none, lesions- none, excoriation- none Lymphadenopathy- none Head- atraumatic            Eyes- Gross vision intact, PERRLA, conjunctivae and secretions clear            Ears- Hearing, canals-normal            Nose- Clear, no-Septal dev, mucus, polyps, erosion, perforation             Throat- Mallampati III , mucosa clear , drainage- none, tonsils- atrophic, +teeth Neck- flexible , trachea midline, no stridor , thyroid  nl, carotid no bruit Chest - symmetrical excursion , unlabored           Heart/CV- RRR , no murmur , no gallop  , no rub, nl s1 s2                           - JVD- none , edema- none, stasis changes- none, varices- none           Lung- clear to P&A, wheeze- none, cough- none , dullness-none, rub- none           Chest wall-  Abd-  Br/ Gen/ Rectal- Not done, not indicated Extrem- cyanosis- none, clubbing, none, atrophy- none, strength- nl Neuro- grossly intact to observation

## 2024-03-25 NOTE — Patient Instructions (Signed)
 Order- schedule home sleep test     dx somnolence  Please call us  about 2 weeks after your sleep test  for results and recommendation  Order- CXR   dx chronic cough

## 2024-03-29 ENCOUNTER — Ambulatory Visit: Payer: Self-pay | Admitting: Internal Medicine

## 2024-03-31 DIAGNOSIS — Z85828 Personal history of other malignant neoplasm of skin: Secondary | ICD-10-CM | POA: Diagnosis not present

## 2024-03-31 DIAGNOSIS — L814 Other melanin hyperpigmentation: Secondary | ICD-10-CM | POA: Diagnosis not present

## 2024-03-31 DIAGNOSIS — L570 Actinic keratosis: Secondary | ICD-10-CM | POA: Diagnosis not present

## 2024-03-31 DIAGNOSIS — L821 Other seborrheic keratosis: Secondary | ICD-10-CM | POA: Diagnosis not present

## 2024-03-31 DIAGNOSIS — D225 Melanocytic nevi of trunk: Secondary | ICD-10-CM | POA: Diagnosis not present

## 2024-04-08 ENCOUNTER — Encounter

## 2024-04-08 DIAGNOSIS — G4730 Sleep apnea, unspecified: Secondary | ICD-10-CM | POA: Diagnosis not present

## 2024-04-08 DIAGNOSIS — R400 Somnolence: Secondary | ICD-10-CM

## 2024-04-09 ENCOUNTER — Encounter: Payer: Self-pay | Admitting: Internal Medicine

## 2024-04-26 ENCOUNTER — Ambulatory Visit: Admitting: Podiatry

## 2024-04-26 ENCOUNTER — Encounter: Payer: Self-pay | Admitting: Podiatry

## 2024-04-26 DIAGNOSIS — B351 Tinea unguium: Secondary | ICD-10-CM

## 2024-04-26 DIAGNOSIS — L84 Corns and callosities: Secondary | ICD-10-CM | POA: Diagnosis not present

## 2024-04-26 DIAGNOSIS — M79674 Pain in right toe(s): Secondary | ICD-10-CM

## 2024-04-26 DIAGNOSIS — I739 Peripheral vascular disease, unspecified: Secondary | ICD-10-CM

## 2024-04-26 DIAGNOSIS — M79675 Pain in left toe(s): Secondary | ICD-10-CM | POA: Diagnosis not present

## 2024-04-26 NOTE — Progress Notes (Signed)
 Subjective:  Patient ID: [name] [name], male    DOB: 17-Sep-1935,  MRN: [id number]  [name] [name] presents to clinic today for:  Chief Complaint  Patient presents with   [facility]    RFC  nail trim and corn on end of R 3rd toe.  Non Diabetic. baby Asprin   Patient notes nails are thick and elongated, causing pain in shoe gear when ambulating.  He also notes a painful corn on the tip of the right third toe.  He is interested in getting this fixed and wants to know what his options are.  PCP is Kuneff, Renee A, DO.  Last seen 03/02/2024  Past Medical History:  Diagnosis Date   Abnormal ankle brachial index (ABI) 07/05/2019   ABI of the toe, at home health visit was abnormal for severe PAD-reading obtained left large toe which has callus formation.   Abnormal echocardiogram 05/13/2019   Aortic valve calcification 05/13/2019   Arteriosclerosis of abdominal aorta (HCC) 01/17/2020   Arthralgia of left temporomandibular joint 01/07/2023   BPH (benign prostatic hyperplasia) 04/23/2007   Qualifier: Diagnosis of  By: Nunzio RN, Dagoberto Caldron    BPH with obstruction/lower urinary tract symptoms 04/23/2007   Qualifier: Diagnosis of   By: Nunzio RN, Dagoberto Caldron        Carotid atherosclerosis 01/21/2020   Chronic cough 07/05/2019   Chronic swimmer's ear of both sides 06/14/2016   Degeneration of lumbar intervertebral disc 12/09/2019   Dyslipidemia 04/23/2007   Qualifier: Diagnosis of  By: Nunzio OBIE Dagoberto Caldron    Elevated PSA 03/12/2023   Essential hypertension 06/02/2008   Qualifier: Diagnosis of  By: Jame  MD, Maude FALCON    Hypervitaminosis D 07/02/2022   Hypothyroidism 05/13/2019   Kidney stones    LAE (left atrial enlargement) 05/13/2019   Mild aortic stenosis 08/07/2021   Moderate mitral regurgitation 08/07/2021   Nephrolithiasis 12/25/2022   Neuropathy    s/p trauma to left lower ext (horse accident)   Osteoarthritis 04/23/2007   Qualifier: Diagnosis of  By: Nunzio RN, Dagoberto Caldron     Otalgia of left ear 01/07/2023   Palpitations 02/04/2008   Qualifier: Diagnosis of  By: Jame  MD, Maude FALCON    Pedal edema 01/21/2023   Sensorineural hearing loss (SNHL), bilateral 06/14/2016   TIA (transient ischemic attack) 05/13/2019   Tinnitus 02/28/2010   deaf left ear(no hearing)   Visual field defect 04/29/2019   Objective:  [name] [name] is a pleasant 88 y.o. male in NAD. AAO x 3.  Vascular Examination: Patient has palpable DP pulse, absent PT pulse bilateral.  Delayed capillary refill bilateral toes.  Sparse digital hair bilateral.  Proximal to distal cooling WNL bilateral.    Dermatological Examination: Interspaces are clear with no open lesions noted bilateral.  Skin is shiny and atrophic bilateral.  Nails are 3-31mm thick, with yellowish/brown discoloration, subungual debris and distal onycholysis x10.  There is pain with compression of nails x10.  There are hyperkeratotic lesions noted on the right third toe with pain on palpation.  Musculoskeletal Examination: Flexible contracture of the right third toe at the PIPJ.  Semirigid contracture of the bilateral second toe at the PIPJ's  Patient qualifies for at-risk foot care because of mild PVD.  Assessment/Plan: 1. Pain due to onychomycosis of toenails of both feet   2. Corns   3. PVD (peripheral vascular disease) (HCC)     Mycotic nails x10 were sharply debrided with sterile  nail nippers and power debriding burr to decrease bulk and length.  Hyperkeratotic lesion on the right third toe was shaved with #312 blade.    Still recommending a percutaneous flexor tenotomy right fifth toe to decrease the corn formation to help straighten the toe out.  He will consider this in the future.  He was given a replacement gel toe crest pad for the right foot.  Return in about 3 months (around 07/27/2024) for RFC.     Awanda CHARM Imperial, DPM, FACFAS [facility]     [address] [place], [place] [address]                Office [phone]  Fax [phone]

## 2024-04-30 DIAGNOSIS — G4733 Obstructive sleep apnea (adult) (pediatric): Secondary | ICD-10-CM | POA: Diagnosis not present

## 2024-05-27 ENCOUNTER — Ambulatory Visit: Admitting: Internal Medicine

## 2024-06-02 ENCOUNTER — Telehealth: Payer: Self-pay

## 2024-06-02 NOTE — Progress Notes (Signed)
 03/08/24- 87 yoM never smoker for sleep evaluation courtesy of Dr Catherine with concern of somnolence and snoring. Medical problem list includes ASCVD, HTN,TIA, Hypothyroid, DDD, BPH,  Epworth score-17 Body weight today 188 lbs Back pain- sleeps in recliner in separate room. Some snoring. No ENT surgery. Aware of heart murmur. Home tested + for Radon. Discussed the use of AI scribe software for clinical note transcription with the patient, who gave verbal consent to proceed. He is concerned about daytime somnolence-can't stay awake. Bedtime 10-10:30 PM, latency 5 minutes, waking once before up 4:45AM. Some chronic dry cough without dyspnea on exertion. Food tries to go down wrong way- suggests LPR/ GERD basis for cough. History of Present Illness   [name] [name] is an 88 year old male with sarcoidosis who presents with concerns about sleep apnea and persistent cough. He was referred by his primary care physician to evaluate for sleep apnea.  He experienced a collapse at home with a significant drop in pulse and unmeasurable blood pressure, requiring emergency medical attention. He did not lose consciousness during this event.  He has a persistent cough that worsens when reclining or turning his head to the left. Nighttime coughing occasionally occurs, relieved by cough drops and sitting upright. He uses antacids for heartburn and acid indigestion.  He uses a CPAP machine nightly for sleep apnea but finds it uncomfortable and sometimes skips use when visiting his daughter's house. He experiences daytime tiredness and restless legs, impacting his ability to maintain CPAP use. His CPAP score is adequate when the device is used consistently.    Assessment and Plan:    Sleep apnea Chronic sleep apnea with CPAP use. Potential silent reflux contributing to symptoms. CPAP is the gold standard treatment. - Order home sleep study to assess current status. - Discuss CPAP use and potential  alternatives if indicated.  Gastroesophageal reflux disease (GERD) Chronic GERD with heartburn and possible silent reflux contributing to nighttime coughing. Current medications reduce stomach acid but do not prevent reflux. - Order chest x-ray to evaluate for pulmonary complications.      06/03/24- 87 yoM never smoker for sleep evaluation courtesy of Dr Catherine with concern of somnolence and snoring. Medical problem list includes ASCVD, HTN,TIA, Hypothyroid, DDD, BPH, GERD, Body weight today  Back pain- sleeps in recliner in separate room. Some snoring. HST(SNAP)- 04/08/24- AHI 21.9/hr, desat to 74%, body weight 185 lbs Discussed the use of AI scribe software for clinical note transcription with the patient, who gave verbal consent to proceed.  History of Present Illness   [name] [name] is an 88 year old male who presents with daytime sleepiness and  sleep apnea. He was referred by his doctor for evaluation of daytime sleepiness and returns now after sleep study.SABRA  He experiences significant daytime sleepiness, feeling tired throughout the day and often dozing off when sitting. His wife hears occasional noises at night suggesting disrupted breathing. A recent sleep study shows he stops breathing approximately 22 times per hour.  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 His nights are short, going to bed around 10:00 PM, and he may find CPAP difficult due to his sleep schedule, but is willing to try.SABRA  He experiences nausea attributed to medication use, difficulty waking up in the morning, and daytime sleepiness.     CXR 03/25/24 (done because he reported GERD and cough at night) FINDINGS: Cardiac shadow is within  normal limits. The lungs are well aerated bilaterally. No focal infiltrate or effusion is seen. Postsurgical changes in shoulders are noted bilaterally. Old healed rib fractures on the right are  noted. IMPRESSION: No acute abnormality seen.  Assessment and Plan:    Moderate obstructive sleep apnea Moderate obstructive sleep apnea with 22 apneic events per hour. Daytime sleepiness attributed to sleep apnea. CPAP recommended as primary treatment due to safety and efficacy. Alternatives include oral appliance; Inspire surgery not initially considered. - Initiate CPAP therapy with a home care company such as [employer]. - Discuss return policy with the home care company if CPAP is not tolerated. - Consider referral to a dental specialist for an oral appliance if CPAP is not tolerated.     ROS-see HPI   + = positive Constitutional:    weight loss, night sweats, fevers, chills, fatigue, lassitude. HEENT:    headaches, difficulty swallowing, tooth/dental problems, sore throat,       sneezing, itching, ear ache, nasal congestion, post nasal drip, snoring CV:    chest pain, orthopnea, PND, swelling in lower extremities, anasarca,                                   +dizziness, palpitations Resp:   shortness of breath with exertion or at rest.                productive cough,   non-productive cough, coughing up of blood.              change in color of mucus.  wheezing.   Skin:    rash or lesions. GI:  No-   heartburn, indigestion, abdominal pain, nausea, vomiting, diarrhea,                 change in bowel habits, loss of appetite GU: dysuria, change in color of urine, no urgency or frequency.   flank pain. MS:   joint pain, stiffness, decreased range of motion, back pain. Neuro-     nothing unusual Psych:  change in mood or affect.  depression or anxiety.   memory loss.  OBJ- Physical Exam General- Alert, Oriented, Affect-appropriate, Distress- none acute, not obese Skin- rash-none, lesions- none, excoriation- none Lymphadenopathy- none Head- atraumatic            Eyes- Gross vision intact, PERRLA, conjunctivae and secretions clear            Ears- Hearing, canals-normal            Nose-  Clear, no-Septal dev, mucus, polyps, erosion, perforation             Throat- Mallampati III , mucosa clear , drainage- none, tonsils- atrophic, +teeth Neck- flexible , trachea midline, no stridor , thyroid  nl, carotid no bruit Chest - symmetrical excursion , unlabored           Heart/CV- RRR , no murmur , no gallop  , no rub, nl s1 s2                           - JVD- none , edema- none, stasis changes- none, varices- none           Lung- clear to P&A, wheeze- none, cough- none , dullness-none, rub- none           Chest wall-  Abd-  Br/ Gen/ Rectal- Not done, not indicated Extrem-  cyanosis- none, clubbing, none, atrophy- none, strength- nl Neuro- grossly intact to observation

## 2024-06-02 NOTE — Telephone Encounter (Signed)
 Copied from CRM #[id number]. Topic: Clinical - Lab/Test Results >> May 27, 2024 12:42 PM Rozanna MATSU wrote: Reason for CRM: pt spouse stated they have been notified of results from sleep study that was completed on 03/2025. Thanks   Addressed -NFN

## 2024-06-03 ENCOUNTER — Ambulatory Visit (INDEPENDENT_AMBULATORY_CARE_PROVIDER_SITE_OTHER): Admitting: Internal Medicine

## 2024-06-03 ENCOUNTER — Encounter: Payer: Self-pay | Admitting: Internal Medicine

## 2024-06-03 VITALS — BP 132/68 | HR 76 | Temp 97.9°F | Ht 69.0 in | Wt 186.4 lb

## 2024-06-03 DIAGNOSIS — G4733 Obstructive sleep apnea (adult) (pediatric): Secondary | ICD-10-CM | POA: Diagnosis not present

## 2024-06-03 NOTE — Patient Instructions (Signed)
 Order- DME [facility]   new CPAP auto 5-15, mask of choice, humidifier, supplies, Airview/ card (wife uses [employer])

## 2024-06-06 ENCOUNTER — Encounter: Payer: Self-pay | Admitting: Internal Medicine

## 2024-07-26 ENCOUNTER — Ambulatory Visit: Admitting: Podiatry

## 2024-07-26 DIAGNOSIS — M79675 Pain in left toe(s): Secondary | ICD-10-CM | POA: Diagnosis not present

## 2024-07-26 DIAGNOSIS — B351 Tinea unguium: Secondary | ICD-10-CM

## 2024-07-26 DIAGNOSIS — L84 Corns and callosities: Secondary | ICD-10-CM | POA: Diagnosis not present

## 2024-07-26 DIAGNOSIS — M79674 Pain in right toe(s): Secondary | ICD-10-CM | POA: Diagnosis not present

## 2024-07-26 DIAGNOSIS — I739 Peripheral vascular disease, unspecified: Secondary | ICD-10-CM

## 2024-07-26 DIAGNOSIS — M2041 Other hammer toe(s) (acquired), right foot: Secondary | ICD-10-CM | POA: Diagnosis not present

## 2024-07-26 NOTE — Progress Notes (Unsigned)
 Nails x 10.  Distal right third toe corn.  He has a semiflexible right second hammertoe and a flexible right third hammertoe.  Discussed a percutaneous flexor tenotomy for the right third toe but he would need an arthroplasty of the second toe.  Informed him we could do both at the same time at the surgery center.  He will consider it.  A gel toe crest was dispensed size large to wear during weightbearing.

## 2024-07-29 ENCOUNTER — Encounter: Payer: Self-pay | Admitting: Pharmacist

## 2024-07-29 NOTE — Progress Notes (Signed)
 Pharmacy Quality Measure Review  This patient is appearing on a report for being at risk of failing the adherence measure for cholesterol (statin) medications this calendar year.   Medication: simvastatin   Last fill date: 03/31/2024 for 90 day supply  Reviewed recent refill history in Dr Annemarie database and showed same last refill date. Called his pharmacy - [facility]. Verified patient filled simvastatin  and levothyroxine  for 90 day supply 06/29/2024. Both medications were picked up.     Insurance report was not up to date. No action needed at this time.   Madelin Ray, PharmD Clinical Pharmacist [facility] Primary Care  Population Health [phone]

## 2024-08-17 ENCOUNTER — Encounter: Payer: Self-pay | Admitting: Family Medicine

## 2024-08-17 ENCOUNTER — Ambulatory Visit: Admitting: Family Medicine

## 2024-08-17 VITALS — BP 126/72 | HR 76 | Temp 98.0°F | Wt 188.0 lb

## 2024-08-17 DIAGNOSIS — I350 Nonrheumatic aortic (valve) stenosis: Secondary | ICD-10-CM

## 2024-08-17 DIAGNOSIS — I6529 Occlusion and stenosis of unspecified carotid artery: Secondary | ICD-10-CM

## 2024-08-17 DIAGNOSIS — I10 Essential (primary) hypertension: Secondary | ICD-10-CM

## 2024-08-17 DIAGNOSIS — I700 Atherosclerosis of aorta: Secondary | ICD-10-CM

## 2024-08-17 DIAGNOSIS — M5136 Other intervertebral disc degeneration, lumbar region with discogenic back pain only: Secondary | ICD-10-CM

## 2024-08-17 DIAGNOSIS — E785 Hyperlipidemia, unspecified: Secondary | ICD-10-CM

## 2024-08-17 DIAGNOSIS — B0089 Other herpesviral infection: Secondary | ICD-10-CM | POA: Insufficient documentation

## 2024-08-17 DIAGNOSIS — G629 Polyneuropathy, unspecified: Secondary | ICD-10-CM

## 2024-08-17 DIAGNOSIS — I340 Nonrheumatic mitral (valve) insufficiency: Secondary | ICD-10-CM

## 2024-08-17 DIAGNOSIS — Z23 Encounter for immunization: Secondary | ICD-10-CM

## 2024-08-17 DIAGNOSIS — I359 Nonrheumatic aortic valve disorder, unspecified: Secondary | ICD-10-CM

## 2024-08-17 DIAGNOSIS — M150 Primary generalized (osteo)arthritis: Secondary | ICD-10-CM

## 2024-08-17 DIAGNOSIS — M26622 Arthralgia of left temporomandibular joint: Secondary | ICD-10-CM

## 2024-08-17 DIAGNOSIS — E034 Atrophy of thyroid (acquired): Secondary | ICD-10-CM

## 2024-08-17 DIAGNOSIS — N138 Other obstructive and reflux uropathy: Secondary | ICD-10-CM

## 2024-08-17 LAB — BASIC METABOLIC PANEL WITH GFR
BUN: 39 mg/dL — ABNORMAL HIGH (ref 6–23)
CO2: 30 meq/L (ref 19–32)
Calcium: 9.3 mg/dL (ref 8.4–10.5)
Chloride: 102 meq/L (ref 96–112)
Creatinine, Ser: 0.98 mg/dL (ref 0.40–1.50)
GFR: 70.11 mL/min (ref >60.00)
Glucose, Bld: 103 mg/dL — ABNORMAL HIGH (ref 70–99)
Potassium: 4.1 meq/L (ref 3.5–5.1)
Sodium: 140 meq/L (ref 135–145)

## 2024-08-17 MED ORDER — GABAPENTIN 600 MG PO TABS: 600.0000 mg | ORAL_TABLET | Freq: Two times a day (BID) | ORAL | 1 refills | Status: AC

## 2024-08-17 MED ORDER — ACYCLOVIR 5 % EX OINT: 1.0000 | TOPICAL_OINTMENT | Freq: Two times a day (BID) | CUTANEOUS | 2 refills | Status: DC

## 2024-08-17 MED ORDER — FLUTICASONE PROPIONATE 50 MCG/ACT NA SUSP: 2.0000 | Freq: Every day | NASAL | 6 refills | Status: AC

## 2024-08-17 MED ORDER — DULOXETINE HCL 30 MG PO CPEP: 30.0000 mg | ORAL_CAPSULE | Freq: Every day | ORAL | 1 refills | Status: AC

## 2024-08-17 MED ORDER — ACYCLOVIR 5 % EX OINT: 1.0000 | TOPICAL_OINTMENT | Freq: Two times a day (BID) | CUTANEOUS | 2 refills | Status: AC

## 2024-08-17 MED ORDER — POTASSIUM CITRATE ER 10 MEQ (1080 MG) PO TBCR: 20.0000 meq | EXTENDED_RELEASE_TABLET | Freq: Two times a day (BID) | ORAL | 1 refills | Status: AC

## 2024-08-17 MED ORDER — CELECOXIB 200 MG PO CAPS: 200.0000 mg | ORAL_CAPSULE | Freq: Every day | ORAL | 1 refills | Status: AC

## 2024-08-17 NOTE — Progress Notes (Signed)
 Patient Care Team    Relationship Specialty Notifications Start End  Catherine Charlies LABOR, DO PCP - General Family Medicine  01/19/19   Lynnell Nottingham, MD Consulting Physician Dermatology  01/19/20   Revankar, Jennifer SAUNDERS, MD Consulting Physician Cardiology  01/19/20   Kemp, Emerge  Specialist  01/19/20   Elma Zachary RAMAN  Optometry  01/19/20    Chief Complaint  Patient presents with   Hypertension   SUBJECTIVE HPI: [name] [name] is a 88 y.o. male present for chronic medical condition management. All past medical history, surgical history, allergies, family history, immunizations and social history was obtained from the patient today and entered into the electronic medical record.    Hypertension/hyperlipidemia: Pt reports compliance with Zocor  40 mg nightly and lasix  20 qd.   Patient denies chest pain, shortness of breath, dizziness or lower extremity edema.  Pt takes a daily baby ASA. Pt is  prescribed statin. Diet: Monitors his diet Exercise: Routine exercise RF: Hypertension, hyperlipidemia, family history of heart disease and stroke  BPH/arthritis/neuropathy: Patient has a history of arthritis status post shoulder replacement and knee arthroplasty.  He also has a history of BPH/s/p TURP.  He is compliant with Celebrex  200 mg daily and this medication has been helpful for his BPH and his arthritis.SABRA  He did try the Flomax  and it was not helpful.   He sustained a lower extremity fracture injury in which he has chronic neuropathy.  Celebrex  200 mg daily and gabapentin  600 mg twice daily is very helpful for pain and keeps him active. Last visit we added Cymbalta  20 mg to his regimen to help with arthritis, neuropathy and provide some coverage for anxiety/mood.  He reports he is not sure if it is working well enough or not, sees some benefit but small.  Rash Patient reports a new acute concern for rash on his finger.  He states it has been present for at least 3 months or more.   Started off as itchy with small blisters and he thought it was poison ivy but it since has not gone away.  He reports every once in and out small little blisters within the rash.  Becomes more itchy at that time.  He has been using over-the-counter ointment and keeping area covered.  ROS: See pertinent positives and negatives per HPI. Review of Systems  Constitutional: Negative.   HENT: Negative.    Eyes: Negative.   Respiratory: Negative.    Cardiovascular: Negative.   Gastrointestinal: Negative.   Genitourinary: Negative.   Musculoskeletal: Negative.   Skin:  Positive for rash.  Neurological: Negative.   Endo/Heme/Allergies: Negative.   Psychiatric/Behavioral: Negative.    All other systems reviewed and are negative.      Patient Active Problem List   Diagnosis Date Noted   Mild aortic stenosis 08/07/2021    Priority: High   Hypothyroidism 05/13/2019    Priority: High   Neuropathy- chronic s/p trauma 01/20/2019    Priority: High   Essential hypertension 06/02/2008    Priority: High   Dyslipidemia 04/23/2007    Priority: High   Aortic atherosclerosis 01/21/2024    Priority: Medium    Moderate mitral regurgitation 08/07/2021    Priority: Medium    Carotid atherosclerosis 01/21/2020    Priority: Medium    Arteriosclerosis of abdominal aorta 01/17/2020    Priority: Medium    Degeneration of lumbar intervertebral disc 12/09/2019    Priority: Medium    Aortic valve calcification 05/13/2019  Priority: Medium    BPH with obstruction/lower urinary tract symptoms 04/23/2007    Priority: Medium     Class: Present on Admission   Osteoarthritis 04/23/2007    Priority: Medium    Herpetic whitlow 08/17/2024   Elevated PSA 03/12/2023   Pedal edema 01/21/2023   Arthralgia of left temporomandibular joint 01/07/2023   Nephrolithiasis 12/25/2022   Hypervitaminosis D 07/02/2022   Chronic cough 07/05/2019   LAE (left atrial enlargement) 05/13/2019   History of TIA (transient  ischemic attack) 05/13/2019   Abnormal echocardiogram 05/13/2019   Visual field defect 04/29/2019   Sensorineural hearing loss (SNHL), bilateral 06/14/2016   Tinnitus 02/28/2010    Social History   Tobacco Use   Smoking status: Never   Smokeless tobacco: Never  Substance Use Topics   Alcohol use: No    Current Outpatient Medications:    aspirin  81 MG chewable tablet, Chew 81 mg by mouth daily., Disp: , Rfl:    cholecalciferol  (VITAMIN D3) 25 MCG (1000 UNIT) tablet, Take 1,000 Units by mouth daily., Disp: , Rfl:    Coenzyme Q10 (COQ10) 200 MG CAPS, Take 200 mg by mouth at bedtime., Disp: , Rfl:    fexofenadine (ALLEGRA) 180 MG tablet, Take 180 mg by mouth daily., Disp: , Rfl:    fluocinolone  (SYNALAR ) 0.01 % external solution, Apply topically 2 (two) times daily., Disp: 60 mL, Rfl: 1   furosemide  (LASIX ) 20 MG tablet, Take 1 tablet (20 mg total) by mouth daily., Disp: 90 tablet, Rfl: 3   levothyroxine  (SYNTHROID ) 50 MCG tablet, Take 1 tablet (50 mcg total) by mouth daily before breakfast. Avoid meal at least 30 minutes after taking medication., Disp: 90 tablet, Rfl: 3   Multiple Vitamin (MULTIVITAMIN) tablet, Take 1 tablet by mouth daily., Disp: , Rfl:    Psyllium (METAMUCIL FIBER PO), Take 2 capsules by mouth daily with breakfast., Disp: , Rfl:    simvastatin  (ZOCOR ) 40 MG tablet, Take 1 tablet (40 mg total) by mouth daily at 6 PM., Disp: 90 tablet, Rfl: 3   vitamin C  (ASCORBIC ACID ) 500 MG tablet, Take 500 mg by mouth at bedtime., Disp: , Rfl:    acyclovir ointment (ZOVIRAX) 5 %, Apply 1 Application topically every 12 (twelve) hours., Disp: 30 g, Rfl: 2   celecoxib  (CELEBREX ) 200 MG capsule, Take 1 capsule (200 mg total) by mouth daily., Disp: 90 capsule, Rfl: 1   DULoxetine  (CYMBALTA ) 30 MG capsule, Take 1 capsule (30 mg total) by mouth daily., Disp: 90 capsule, Rfl: 1   fluticasone  (FLONASE ) 50 MCG/ACT nasal spray, Place 2 sprays into both nostrils daily., Disp: 16 g, Rfl: 6    gabapentin  (NEURONTIN ) 600 MG tablet, Take 1 tablet (600 mg total) by mouth 2 (two) times daily., Disp: 180 tablet, Rfl: 1   potassium citrate  (UROCIT-K ) 10 MEQ (1080 MG) SR tablet, Take 2 tablets (20 mEq total) by mouth 2 (two) times daily with a meal., Disp: 360 tablet, Rfl: 1  No Known Allergies  OBJECTIVE: BP 126/72   Pulse 76   Temp 98 F (36.7 C)   Wt 188 lb (85.3 kg)   SpO2 96%   BMI 27.76 kg/m  Physical Exam Vitals and nursing note reviewed.  Constitutional:      General: He is not in acute distress.    Appearance: Normal appearance. He is not ill-appearing, toxic-appearing or diaphoretic.  HENT:     Head: Normocephalic and atraumatic.     Right Ear: Tympanic membrane, ear canal and external  ear normal.     Left Ear: Tympanic membrane, ear canal and external ear normal.     Nose: No congestion or rhinorrhea.     Mouth/Throat:     Mouth: Mucous membranes are moist.     Pharynx: No oropharyngeal exudate or posterior oropharyngeal erythema.  Eyes:     General: No scleral icterus.       Right eye: No discharge.        Left eye: No discharge.     Extraocular Movements: Extraocular movements intact.     Pupils: Pupils are equal, round, and reactive to light.  Cardiovascular:     Rate and Rhythm: Normal rate and regular rhythm.     Heart sounds: Murmur heard.  Pulmonary:     Effort: Pulmonary effort is normal. No respiratory distress.     Breath sounds: Normal breath sounds. No wheezing, rhonchi or rales.  Abdominal:     General: Abdomen is flat.     Palpations: Abdomen is soft.  Musculoskeletal:     Cervical back: Neck supple. No tenderness.     Right lower leg: No edema.     Left lower leg: No edema.  Lymphadenopathy:     Cervical: No cervical adenopathy.  Skin:    General: Skin is warm.     Findings: Rash (Left finger with dry scaly oval hyperpigmented rash) present.  Neurological:     Mental Status: He is alert and oriented to person, place, and time. Mental  status is at baseline.  Psychiatric:        Mood and Affect: Mood normal.        Behavior: Behavior normal.        Thought Content: Thought content normal.        Judgment: Judgment normal.    ASSESSMENT AND PLAN: [name] [name] is a 88 y.o. male present for Chronic Conditions/illness Management Benign prostatic hyperplasia without lower urinary tract symptoms/nephrolithiasis/elevated PSA Stable Continue celebrex . - flomax  tried and reported not helpful.  - continue potassium citrate  Est/w urology  Hypertension/edema/dyslipidemia/h/o TIA/Overweight (BMI 25.0-29.9)/AS-mild/MR-mod/aortic valve calcification/arteriosclerosis of abdominal aorta Continue Lasix  20 mg daily  Continue Zocor  40 mg daily> [facility] - continue baby aspirin  Reviewed recent echo 02/19/2023 with EF 60% and diastolic 1 dysfunction.  Mild mitral regurg, mild aortic regurg and mild stenosis. - low sodium diet and routine exercise.  BMP collected today  Neuropathy- chronic s/p trauma Stable-status post trauma to lower extremity. Continue gabapentin  600 mg twice daily Continue Cymbalta  20  Primary osteoarthritis involving multiple joints Stable Continue Celebrex  Continue Cymbalta  20  Hypothyroidism: Stable Continue levo 50 mcg.  TSH normal 01/2024  Herpetic whitlow Acyclovir ointment twice daily until resolved prescribed  Health maintenance: Influenza vaccine completed today  Return in about 7 months (around 03/03/2025) for cpe (20 min), Routine chronic condition follow-up.  Orders Placed This Encounter  Procedures   Flu vaccine HIGH DOSE PF(Fluzone Trivalent)   Basic Metabolic Panel (BMET)    Meds ordered this encounter  Medications   celecoxib  (CELEBREX ) 200 MG capsule    Sig: Take 1 capsule (200 mg total) by mouth daily.    Dispense:  90 capsule    Refill:  1   DULoxetine  (CYMBALTA ) 30 MG capsule    Sig: Take 1 capsule (30 mg total) by mouth daily.    Dispense:  90 capsule    Refill:  1    fluticasone  (FLONASE ) 50 MCG/ACT nasal spray    Sig: Place 2 sprays into both nostrils  daily.    Dispense:  16 g    Refill:  6    Hold until  pt request   gabapentin  (NEURONTIN ) 600 MG tablet    Sig: Take 1 tablet (600 mg total) by mouth 2 (two) times daily.    Dispense:  180 tablet    Refill:  1    Hold until  pt request   potassium citrate  (UROCIT-K ) 10 MEQ (1080 MG) SR tablet    Sig: Take 2 tablets (20 mEq total) by mouth 2 (two) times daily with a meal.    Dispense:  360 tablet    Refill:  1   DISCONTD: acyclovir  ointment (ZOVIRAX ) 5 %    Sig: Apply 1 Application topically every 12 (twelve) hours.    Dispense:  30 g    Refill:  2   acyclovir  ointment (ZOVIRAX ) 5 %    Sig: Apply 1 Application topically every 12 (twelve) hours.    Dispense:  30 g    Refill:  2    Referral Orders  No referral(s) requested today       Charlies Bellini, DO 08/17/2024

## 2024-08-17 NOTE — Patient Instructions (Addendum)

## 2024-08-18 ENCOUNTER — Ambulatory Visit: Payer: Self-pay | Admitting: Family Medicine

## 2024-08-25 ENCOUNTER — Ambulatory Visit: Payer: PPO | Admitting: Urology

## 2024-09-20 ENCOUNTER — Encounter: Payer: Self-pay | Admitting: Urology

## 2024-09-20 ENCOUNTER — Ambulatory Visit: Admitting: Urology

## 2024-09-20 ENCOUNTER — Ambulatory Visit (HOSPITAL_BASED_OUTPATIENT_CLINIC_OR_DEPARTMENT_OTHER)
Admission: RE | Admit: 2024-09-20 | Discharge: 2024-09-20 | Disposition: A | Discharge status: Home / Self Care | Source: Ambulatory Visit | Attending: Urology | Admitting: Urology

## 2024-09-20 VITALS — BP 144/78 | HR 76 | Ht 69.0 in | Wt 190.0 lb

## 2024-09-20 DIAGNOSIS — N138 Other obstructive and reflux uropathy: Secondary | ICD-10-CM | POA: Diagnosis not present

## 2024-09-20 DIAGNOSIS — R972 Elevated prostate specific antigen [PSA]: Secondary | ICD-10-CM | POA: Diagnosis not present

## 2024-09-20 DIAGNOSIS — N200 Calculus of kidney: Secondary | ICD-10-CM

## 2024-09-20 DIAGNOSIS — N401 Enlarged prostate with lower urinary tract symptoms: Secondary | ICD-10-CM

## 2024-09-20 LAB — URINALYSIS, ROUTINE W REFLEX MICROSCOPIC
Bilirubin, UA: NEGATIVE
Glucose, UA: NEGATIVE
Ketones, UA: NEGATIVE
Nitrite, UA: NEGATIVE
Protein,UA: NEGATIVE
Specific Gravity, UA: 1.01 (ref 1.005–1.030)
Urobilinogen, Ur: 0.2 mg/dL (ref 0.2–1.0)
pH, UA: 5 (ref 5.0–7.5)

## 2024-09-20 LAB — MICROSCOPIC EXAMINATION: Bacteria, UA: NONE SEEN

## 2024-09-20 NOTE — Progress Notes (Addendum)
 "  Assessment: 1. BPH with obstruction/lower urinary tract symptoms   2. Nephrolithiasis   3. Elevated PSA      Plan: Continue potassium citrate  for stone prevention. He wishes to continue to monitor his BPH with lower urinary tract symptoms. I personally reviewed the KUB from today with results as noted below Return to office in 1 year   Chief Complaint:  Chief Complaint  Patient presents with   Benign Prostatic Hypertrophy    History of Present Illness:  [name] [name] is a 89 y.o. male who is seen for further evaluation of BPH with lower urinary tract symptoms and nephrolithiasis.  BPH with LUTS: He has a history of BPH with obstruction and nephrolithiasis.  He is status post a TURP in 2013.  He had recurrence of his lower urinary tract symptoms with frequency, urgency, nocturia x 2, and incomplete emptying.  He had previously tried Flomax  without improvement.  He has been using Celebrex  at night with reported improvement in his urinary symptoms.  Evaluation with cystoscopy in 2019 showed enlargement of the prostate and small bladder calculi.  PVR from 6/21 was 245 mL.  He was given a trial of alfuzosin in June 2021 but discontinued the medication due to worsening of his symptoms.  He reported that his urinary symptoms were overall stable in January 2022.  He continued to have frequency and urgency primarily in the morning and nocturia x 2.  PVR at that time was 167 mL.  He was given a trial of Myrbetriq 25 mg daily.  He did not see any improvement in his symptoms with Myrbetriq and discontinued the medication after several weeks.  At the time of his visit in 2/22, he was not taking any medication for his lower urinary tract symptoms. He was seen by Dr. Chauncey in 2/24.  He did not want to pursue any additional medical therapy at that time. At his visit in June 2024, he reported that his lower urinary tract symptoms were stable.  He continued to have frequency and urgency, primarily  in the morning.  He also had nocturia x 2. IPSS = 23.  At his visit in December 2024, he reported that his lower urinary tract symptoms were fairly stable.  He was recently started on furosemide  and had increased frequency and urgency in the morning after taking the medication.  He reported occasional urge incontinence.  His nocturia was stable at 2 times per night.  No dysuria or gross hematuria.  He reported that Celebrex  continues to control his nighttime symptoms. IPSS = 21/2.  He returns today for follow-up.  His lower urinary tract symptoms are fairly stable.  He continues with some frequency, urgency, and occasional urge incontinence.  No dysuria or gross hematuria. IPSS = 19/2  Nephrolithiasis: He has a history of nephrolithiasis and is status post lithotripsy in August 2008.  He has been on potassium citrate  for a number of years.  CT imaging from 2019 showed a left renal calculus.  KUB from 6/21 showed calcifications in the left lower renal shadow measuring 15 x 9 and 7 x 10 mm respectively. KUB from 2/24 showed stable calcifications in the left lower renal shadow. CT chest from 12/20/2022 showed multiple renal calculi in the lower pole left kidney with associated mild caliectasis. KUB from 12/24 showed a conglomeration of left-sided renal calculi, stable since 2019.  He continues on potassium citrate .  No recent stone symptoms.  No flank pain.  Elevated PSA: He has a history  of elevated PSA with a prior negative biopsy. PSA results: 3.82 11/17 4.61 10/18 25% free 6.31 5/19 36% free 5.02 6/19 21% free 4.88 1/20 26% free 5.28 7/20 20% free 4.24 6/21 23% free  6.06 6/25  Given his age, continued monitoring of the PSA was not recommended.  Portions of the above documentation were copied from a prior visit for review purposes only.  Past Medical History:  Past Medical History:  Diagnosis Date   Abnormal ankle brachial index (ABI) 07/05/2019   ABI of the toe, at home health  visit was abnormal for severe PAD-reading obtained left large toe which has callus formation.   Abnormal echocardiogram 05/13/2019   Aortic valve calcification 05/13/2019   Arteriosclerosis of abdominal aorta 01/17/2020   Arthralgia of left temporomandibular joint 01/07/2023   BPH (benign prostatic hyperplasia) 04/23/2007   Qualifier: Diagnosis of  By: Nunzio RN, Dagoberto Caldron    BPH with obstruction/lower urinary tract symptoms 04/23/2007   Qualifier: Diagnosis of   By: Nunzio RN, Dagoberto Caldron        Carotid atherosclerosis 01/21/2020   Chronic cough 07/05/2019   Chronic swimmer's ear of both sides 06/14/2016   Degeneration of lumbar intervertebral disc 12/09/2019   Dyslipidemia 04/23/2007   Qualifier: Diagnosis of  By: Nunzio OBIE Dagoberto Caldron    Elevated PSA 03/12/2023   Essential hypertension 06/02/2008   Qualifier: Diagnosis of  By: Jame  MD, Maude FALCON    Hypervitaminosis D 07/02/2022   Hypothyroidism 05/13/2019   Kidney stones    LAE (left atrial enlargement) 05/13/2019   Mild aortic stenosis 08/07/2021   Moderate mitral regurgitation 08/07/2021   Nephrolithiasis 12/25/2022   Neuropathy    s/p trauma to left lower ext (horse accident)   Osteoarthritis 04/23/2007   Qualifier: Diagnosis of  By: Nunzio RN, Dagoberto Caldron    Otalgia of left ear 01/07/2023   Palpitations 02/04/2008   Qualifier: Diagnosis of  By: Jame  MD, Maude FALCON    Pedal edema 01/21/2023   Sensorineural hearing loss (SNHL), bilateral 06/14/2016   TIA (transient ischemic attack) 05/13/2019   Tinnitus 02/28/2010   deaf left ear(no hearing)   Visual field defect 04/29/2019    Past Surgical History:  Past Surgical History:  Procedure Laterality Date   APPENDECTOMY     CATARACT EXTRACTION Bilateral 04/2020   COLONOSCOPY     HERNIA REPAIR     left foot surgery for fx  Jul 30, 2011   LITHOTRIPSY  1992   cystoscopy with stent placement also done(1 stone fragment remains)   right shoulder replacement  Sep 28, 2009    TOTAL KNEE ARTHROPLASTY Right 08/30/2013   Procedure: RIGHT TOTAL KNEE ARTHROPLASTY RIGHT ;  Surgeon: Dempsey LULLA Moan, MD;  Location: [facility];  Service: Orthopedics;  Laterality: Right;   TOTAL SHOULDER ARTHROPLASTY Left 02/02/2015   Procedure: LEFT TOTAL SHOULDER ARTHROPLASTY;  Surgeon: Franky Pointer, MD;  Location: [facility];  Service: Orthopedics;  Laterality: Left;   TRANSURETHRAL RESECTION OF PROSTATE  03/27/2012   Procedure: TRANSURETHRAL RESECTION OF THE PROSTATE WITH GYRUS INSTRUMENTS;  Surgeon: Mark C Ottelin, MD;  Location: [facility];  Service: Urology;;    Allergies:  No Known Allergies  Family History:  Family History  Problem Relation Age of Onset   Heart disease Mother 62       MI   Stroke Father 55   COPD Sister    Diabetes Sister    Diabetes Brother     Social History:  Social  History   Tobacco Use   Smoking status: Never   Smokeless tobacco: Never  Vaping Use   Vaping status: Never Used  Substance Use Topics   Alcohol use: No   Drug use: No    ROS: Constitutional:  Negative for fever, chills, weight loss CV: Negative for chest pain, previous MI, hypertension Respiratory:  Negative for shortness of breath, wheezing, sleep apnea, frequent cough GI:  Negative for nausea, vomiting, bloody stool, GERD  Physical exam: BP (!) 144/78   Pulse 76   Ht 5' 9 (1.753 m)   Wt 190 lb (86.2 kg)   BMI 28.06 kg/m  GENERAL APPEARANCE:  Well appearing, well developed, well nourished, NAD HEENT:  Atraumatic, normocephalic, oropharynx clear NECK:  Supple without lymphadenopathy or thyromegaly ABDOMEN:  Soft, non-tender, no masses EXTREMITIES:  Moves all extremities well, without clubbing, cyanosis, or edema NEUROLOGIC:  Alert and oriented x 3, normal gait, CN II-XII grossly intact MENTAL STATUS:  appropriate BACK:  Non-tender to palpation, No CVAT SKIN:  Warm, dry, and intact  Results: U/A: pH 5.0, 0-5 WBCs, 0-2 RBCs  KUB: Calcifications in lower left renal shadow  measuring up to 3 cm in size, appears stable. "

## 2024-10-19 ENCOUNTER — Encounter

## 2024-10-25 ENCOUNTER — Ambulatory Visit: Admitting: Podiatry

## 2024-10-27 ENCOUNTER — Ambulatory Visit

## 2025-03-04 ENCOUNTER — Encounter: Admitting: Family Medicine

## 2025-09-22 ENCOUNTER — Ambulatory Visit: Admitting: Urology
